# Patient Record
Sex: Female | Born: 1970 | Race: Black or African American | Hispanic: No | Marital: Married | Smoking: Never smoker
Health system: Southern US, Community
[De-identification: ages and names within clinical notes are randomized; demographics above are authoritative.]

## PROBLEM LIST (undated history)

## (undated) DIAGNOSIS — G4730 Sleep apnea, unspecified: Secondary | ICD-10-CM

## (undated) DIAGNOSIS — Z801 Family history of malignant neoplasm of trachea, bronchus and lung: Secondary | ICD-10-CM

## (undated) DIAGNOSIS — M1990 Unspecified osteoarthritis, unspecified site: Secondary | ICD-10-CM

## (undated) DIAGNOSIS — K829 Disease of gallbladder, unspecified: Secondary | ICD-10-CM

## (undated) DIAGNOSIS — Z800 Family history of malignant neoplasm of digestive organs: Secondary | ICD-10-CM

## (undated) DIAGNOSIS — C801 Malignant (primary) neoplasm, unspecified: Secondary | ICD-10-CM

## (undated) DIAGNOSIS — Z923 Personal history of irradiation: Secondary | ICD-10-CM

## (undated) DIAGNOSIS — B029 Zoster without complications: Secondary | ICD-10-CM

## (undated) DIAGNOSIS — Z803 Family history of malignant neoplasm of breast: Secondary | ICD-10-CM

## (undated) DIAGNOSIS — D219 Benign neoplasm of connective and other soft tissue, unspecified: Secondary | ICD-10-CM

## (undated) DIAGNOSIS — Z5189 Encounter for other specified aftercare: Secondary | ICD-10-CM

## (undated) DIAGNOSIS — F419 Anxiety disorder, unspecified: Secondary | ICD-10-CM

## (undated) DIAGNOSIS — I10 Essential (primary) hypertension: Secondary | ICD-10-CM

## (undated) HISTORY — DX: Disease of gallbladder, unspecified: K82.9

## (undated) HISTORY — DX: Encounter for other specified aftercare: Z51.89

## (undated) HISTORY — DX: Anxiety disorder, unspecified: F41.9

## (undated) HISTORY — DX: Benign neoplasm of connective and other soft tissue, unspecified: D21.9

## (undated) HISTORY — DX: Family history of malignant neoplasm of breast: Z80.3

## (undated) HISTORY — DX: Family history of malignant neoplasm of trachea, bronchus and lung: Z80.1

## (undated) HISTORY — DX: Malignant (primary) neoplasm, unspecified: C80.1

## (undated) HISTORY — DX: Zoster without complications: B02.9

## (undated) HISTORY — PX: CHOLECYSTECTOMY: SHX55

## (undated) HISTORY — DX: Family history of malignant neoplasm of digestive organs: Z80.0

## (undated) HISTORY — PX: FOOT SURGERY: SHX648

## (undated) HISTORY — PX: TUBAL LIGATION: SHX77

---

## 1998-03-28 ENCOUNTER — Other Ambulatory Visit: Admission: RE | Admit: 1998-03-28 | Discharge: 1998-03-28 | Payer: Self-pay | Admitting: *Deleted

## 1998-03-28 ENCOUNTER — Encounter: Admission: RE | Admit: 1998-03-28 | Discharge: 1998-03-28 | Payer: Self-pay | Admitting: Internal Medicine

## 1999-02-06 ENCOUNTER — Encounter: Admission: RE | Admit: 1999-02-06 | Discharge: 1999-02-06 | Payer: Self-pay | Admitting: Internal Medicine

## 1999-02-14 ENCOUNTER — Other Ambulatory Visit: Admission: RE | Admit: 1999-02-14 | Discharge: 1999-02-14 | Payer: Self-pay | Admitting: *Deleted

## 1999-04-07 ENCOUNTER — Emergency Department (HOSPITAL_COMMUNITY): Admission: EM | Admit: 1999-04-07 | Discharge: 1999-04-07 | Payer: Self-pay | Admitting: Emergency Medicine

## 2000-02-17 ENCOUNTER — Other Ambulatory Visit: Admission: RE | Admit: 2000-02-17 | Discharge: 2000-02-17 | Payer: Self-pay | Admitting: Internal Medicine

## 2000-02-24 ENCOUNTER — Encounter: Payer: Self-pay | Admitting: Internal Medicine

## 2000-02-24 ENCOUNTER — Ambulatory Visit (HOSPITAL_COMMUNITY): Admission: RE | Admit: 2000-02-24 | Discharge: 2000-02-24 | Payer: Self-pay | Admitting: Internal Medicine

## 2000-04-17 ENCOUNTER — Ambulatory Visit (HOSPITAL_COMMUNITY): Admission: RE | Admit: 2000-04-17 | Discharge: 2000-04-17 | Payer: Self-pay | Admitting: Family Medicine

## 2000-05-11 ENCOUNTER — Observation Stay (HOSPITAL_COMMUNITY): Admission: RE | Admit: 2000-05-11 | Discharge: 2000-05-13 | Payer: Self-pay | Admitting: General Surgery

## 2000-05-11 ENCOUNTER — Encounter (INDEPENDENT_AMBULATORY_CARE_PROVIDER_SITE_OTHER): Payer: Self-pay | Admitting: Specialist

## 2000-10-22 ENCOUNTER — Emergency Department (HOSPITAL_COMMUNITY): Admission: EM | Admit: 2000-10-22 | Discharge: 2000-10-22 | Payer: Self-pay | Admitting: Internal Medicine

## 2001-03-23 ENCOUNTER — Other Ambulatory Visit: Admission: RE | Admit: 2001-03-23 | Discharge: 2001-03-23 | Payer: Self-pay | Admitting: Obstetrics and Gynecology

## 2002-08-22 ENCOUNTER — Ambulatory Visit (HOSPITAL_COMMUNITY): Admission: RE | Admit: 2002-08-22 | Discharge: 2002-08-22 | Payer: Self-pay | Admitting: *Deleted

## 2002-08-22 ENCOUNTER — Encounter: Payer: Self-pay | Admitting: *Deleted

## 2002-11-29 ENCOUNTER — Ambulatory Visit (HOSPITAL_COMMUNITY): Admission: RE | Admit: 2002-11-29 | Discharge: 2002-11-29 | Payer: Self-pay | Admitting: *Deleted

## 2002-11-29 ENCOUNTER — Encounter: Payer: Self-pay | Admitting: *Deleted

## 2003-01-26 ENCOUNTER — Ambulatory Visit (HOSPITAL_COMMUNITY): Admission: RE | Admit: 2003-01-26 | Discharge: 2003-01-26 | Payer: Self-pay | Admitting: Family Medicine

## 2003-01-26 ENCOUNTER — Encounter: Payer: Self-pay | Admitting: Family Medicine

## 2003-03-07 ENCOUNTER — Encounter: Admission: RE | Admit: 2003-03-07 | Discharge: 2003-03-21 | Payer: Self-pay | Admitting: Sports Medicine

## 2004-02-26 ENCOUNTER — Other Ambulatory Visit: Admission: RE | Admit: 2004-02-26 | Discharge: 2004-02-26 | Payer: Self-pay | Admitting: Family Medicine

## 2005-04-09 ENCOUNTER — Other Ambulatory Visit: Admission: RE | Admit: 2005-04-09 | Discharge: 2005-04-09 | Payer: Self-pay | Admitting: Family Medicine

## 2009-11-12 ENCOUNTER — Emergency Department (HOSPITAL_COMMUNITY): Admission: EM | Admit: 2009-11-12 | Discharge: 2009-11-12 | Payer: Self-pay | Admitting: Family Medicine

## 2010-09-01 ENCOUNTER — Emergency Department (HOSPITAL_COMMUNITY)
Admission: EM | Admit: 2010-09-01 | Discharge: 2010-09-02 | Payer: Self-pay | Source: Home / Self Care | Admitting: Emergency Medicine

## 2010-11-11 LAB — COMPREHENSIVE METABOLIC PANEL
AST: 18 U/L (ref 0–37)
CO2: 27 mEq/L (ref 19–32)
Chloride: 107 mEq/L (ref 96–112)
Creatinine, Ser: 0.98 mg/dL (ref 0.4–1.2)
GFR calc Af Amer: >60 mL/min (ref >60)
GFR calc non Af Amer: >60 mL/min (ref >60)
Glucose, Bld: 112 mg/dL — ABNORMAL HIGH (ref 70–99)
Total Bilirubin: 0.6 mg/dL (ref 0.3–1.2)

## 2010-11-11 LAB — CBC
HCT: 42.2 % (ref 36.0–46.0)
Hemoglobin: 13.8 g/dL (ref 12.0–15.0)
MCH: 26.8 pg (ref 26.0–34.0)
MCHC: 32.7 g/dL (ref 30.0–36.0)
MCV: 82.1 fL (ref 78.0–100.0)
RBC: 5.14 MIL/uL — ABNORMAL HIGH (ref 3.87–5.11)

## 2010-11-11 LAB — DIFFERENTIAL
Basophils Absolute: 0 10*3/uL (ref 0.0–0.1)
Eosinophils Absolute: 0.1 10*3/uL (ref 0.0–0.7)
Eosinophils Relative: 1 % (ref 0–5)
Lymphocytes Relative: 15 % (ref 12–46)
Neutrophils Relative %: 77 % (ref 43–77)

## 2010-11-11 LAB — URINALYSIS, ROUTINE W REFLEX MICROSCOPIC
Bilirubin Urine: NEGATIVE
Glucose, UA: NEGATIVE mg/dL
Hgb urine dipstick: NEGATIVE
Ketones, ur: NEGATIVE mg/dL
Specific Gravity, Urine: 1.02 (ref 1.005–1.030)
pH: 7 (ref 5.0–8.0)

## 2010-11-11 LAB — POCT PREGNANCY, URINE: Preg Test, Ur: NEGATIVE

## 2011-01-17 NOTE — Op Note (Signed)
[facility]  Patient:    Erin Gallagher, Erin Gallagher                     MRN: [id number] Proc. Date: 05/11/00 Adm. Date:  [id number] Attending:  Arlis Porta CC:         Kern Reap, M.D.  Anselmo Rod, M.D.   Operative Report  PREOPERATIVE DIAGNOSIS:  Chronic calculus cholecystitis.  POSTOPERATIVE DIAGNOSIS:  Chronic calculus cholecystitis.  PROCEDURE:  Laparoscopic cholecystectomy.  SURGEON:  Adolph Pollack, M.D.  ASSISTANT:  Donnie Coffin. Samuella Cota, M.D.  ANESTHESIA:  General.  INDICATIONS:  [name] is a 40 year old female who has had three classic attacks of biliary colic and has gallstones by ultrasound.  Her common bile duct diameter is normal.  Her preoperative liver function tests are normal. She now presents for elective laparoscopic cholecystectomy.  DESCRIPTION OF PROCEDURE:  She is placed supine on the operating table, and a general anesthetic was administered.  Her abdomen was sterilely prepped and draped.  A previous transverse subumbilical incision was reincised sharply, and the scar tissue was divided sharply down to the level of the fascia where a 1-cm incision was made in the midline fascia.  A pursestring suture of #0 Vicryl was placed around the fascial edges.  The peritoneal cavity was entered bluntly and under direct vision.  A Hasson trocar was introduced into the peritoneal cavity and a pneumoperitoneum was created by insufflation of carbon dioxide gas.  The laparoscope was introduced, and the surface of the liver was examined.  No abnormalities were noted.  Under direct vision, an 11-mm trocar was placed through an epigastric incision, and two 5-mm trocars were placed through right abdominal incisions.  The fundus of the gallbladder was grasped and retracted toward the right shoulder and the infundibulum was grasped and retracted laterally.  Using careful blunt dissection, I identified the cystic artery and could  trace it down to the formation of the common bile duct and also up into the neck of the gallbladder.  I clipped it three times proximally, once distally, and then divided it sharply.  Next, the cystic artery was identified, clipped, and divided.  Using electrocautery, the gallbladder was dissected free from the liver bed.  Bleeding points were controlled with the cautery.  There was no evidence of bleeding or bile leak once the gallbladder was completely removed.  I reinspected the gallbladder fossa once again and noted it was dry.  I subsequently removed the gallbladder through a subumbilical port.  Next, all trocars were removed, and the pneumoperitoneum was released.  The sub umbilical fascia defect was closed by tightening up and tying down the pursestring suture.  The skin incisions were then closed with 4-0 Monocril subcuticular stitches followed by sterile dressings.  She tolerated the procedure well without any apparent complications and was taken to the recovery room in satisfactory condition. DD:  05/11/00 TD:  05/11/00 Job: [id number]

## 2011-01-17 NOTE — Procedures (Signed)
[facility]  Patient:    Erin Gallagher, Erin Gallagher                     MRN: [id number] Proc. Date: 04/17/00 Adm. Date:  [id number] Disc. Date: [id number] Attending:  Tawni Millers CC:         Eunice Blase, M.D.             Adolph Pollack, M.D.                           Procedure Report  DATE OF BIRTH:  11-Aug-1971  PROCEDURE PERFORMED:  Esophagogastroduodenoscopy.  ENDOSCOPIST:  Anselmo Rod, M.D.  INSTRUMENT USED:  Olympus video panendoscope.  INDICATIONS:  Epigastric and right upper quadrant pain in a 40 year old female, rule out peptic ulcer disease, esophagitis, gastritis, etc.  PREPARATION:  Informed consent was procured from the patient and the patient was fasted for eight hours prior to the procedure.  PHYSICAL EXAMINATION:  VITAL SIGNS:  Stable.  NECK:  Supple.  CHEST:  Clear to auscultation.  HEART:  S1, S2 regular.  ABDOMEN:  Soft with normal abdominal bowel sounds. Epigastric tenderness on palpation with guarding noted.  No rebound, no rigidity, no hepatosplenomegaly.  DESCRIPTION OF PROCEDURE:  The patient was placed in the left lateral decubitus position and sedated with 50 mg of Demerol and 5 mg of Versed intravenously.  Once the patient was adequately sedated, and maintained on low-flow oxygen and continuous cardiac monitoring, the Olympus video panendoscope was advanced through the mouth piece, over the tongue, into the esophagus under direct vision.  The entire esophagus appeared normal without evidence of ring, stricture, mass, bleed, or esophagitis.  A small hiatal hernia was seen on high retroflexion.  The rest of the gastric mucosa and the proximal small bowel up to 60.0 cm appeared normal.  IMPRESSION:  Normal esophagogastroduodenoscopy except for a small hiatal hernia.  RECOMMENDATIONS: 1. The patient is to follow up with Dr. Adolph Pollack.  She is scheduled    to see him on May 01, 2000, for  evaluation of a laparoscopic    cholecystectomy. 2. She has strongly been advised to refrain from the use of all nonsteroidal    anti-inflammatory drugs. 3. Continue PPI at this time. 4. Outpatient followup after her laparoscopic cholecystectomy has been done.DD: 04/17/00 TD:  04/18/00 Job: [id number]

## 2011-03-17 ENCOUNTER — Inpatient Hospital Stay (INDEPENDENT_AMBULATORY_CARE_PROVIDER_SITE_OTHER)
Admission: RE | Admit: 2011-03-17 | Discharge: 2011-03-17 | Disposition: A | Discharge status: Home / Self Care | Payer: 59 | Source: Ambulatory Visit | Attending: Family Medicine | Admitting: Family Medicine

## 2011-03-17 DIAGNOSIS — R112 Nausea with vomiting, unspecified: Secondary | ICD-10-CM

## 2011-03-17 DIAGNOSIS — R51 Headache: Secondary | ICD-10-CM

## 2011-09-06 ENCOUNTER — Other Ambulatory Visit (HOSPITAL_COMMUNITY): Payer: Self-pay | Admitting: Internal Medicine

## 2011-09-06 ENCOUNTER — Ambulatory Visit (HOSPITAL_COMMUNITY)
Admission: RE | Admit: 2011-09-06 | Discharge: 2011-09-06 | Disposition: A | Discharge status: Home / Self Care | Payer: 59 | Source: Ambulatory Visit | Attending: Internal Medicine | Admitting: Internal Medicine

## 2011-09-06 DIAGNOSIS — IMO0001 Reserved for inherently not codable concepts without codable children: Secondary | ICD-10-CM

## 2011-09-06 DIAGNOSIS — R52 Pain, unspecified: Secondary | ICD-10-CM

## 2011-09-06 DIAGNOSIS — R079 Chest pain, unspecified: Secondary | ICD-10-CM | POA: Insufficient documentation

## 2011-09-06 DIAGNOSIS — R0989 Other specified symptoms and signs involving the circulatory and respiratory systems: Secondary | ICD-10-CM | POA: Insufficient documentation

## 2011-09-06 DIAGNOSIS — R0602 Shortness of breath: Secondary | ICD-10-CM | POA: Insufficient documentation

## 2011-11-12 ENCOUNTER — Emergency Department (INDEPENDENT_AMBULATORY_CARE_PROVIDER_SITE_OTHER)
Admission: EM | Admit: 2011-11-12 | Discharge: 2011-11-12 | Disposition: A | Discharge status: Home Health | Payer: 59 | Source: Home / Self Care | Attending: Family Medicine | Admitting: Family Medicine

## 2011-11-12 ENCOUNTER — Encounter (HOSPITAL_COMMUNITY): Payer: Self-pay | Admitting: Emergency Medicine

## 2011-11-12 DIAGNOSIS — J310 Chronic rhinitis: Secondary | ICD-10-CM

## 2011-11-12 HISTORY — DX: Essential (primary) hypertension: I10

## 2011-11-12 MED ORDER — FLUTICASONE PROPIONATE 50 MCG/ACT NA SUSP: 2.0000 | Freq: Every day | NASAL | Status: AC

## 2011-11-12 MED ORDER — HYDROCOD POLST-CHLORPHEN POLST 10-8 MG/5ML PO LQCR: 5.0000 mL | Freq: Two times a day (BID) | ORAL | Status: DC | PRN

## 2011-11-12 NOTE — ED Provider Notes (Signed)
History     CSN: [id number]  Arrival date & time 11/12/11  3086   First MD Initiated Contact with Patient 11/12/11 0840      Chief Complaint  Patient presents with  . Sinusitis    (Consider location/radiation/quality/duration/timing/severity/associated sxs/prior treatment) HPI Comments: Erin Gallagher presents for evaluation of nasal congestion, rhinorrhea, sore throat, and itchy ears. She reports onset of symptoms 5 days ago. She denies any fever or history of seasonal allergies. She reports a cough is worse at night. The cough is nonproductive.  Patient is a 41 y.o. female presenting with URI. The history is provided by the patient.  URI The primary symptoms include fatigue, sore throat, cough and myalgias. Primary symptoms do not include fever. The current episode started 3 to 5 days ago. This is a new problem. The problem has not changed since onset. The fatigue began 3 to 5 days ago. The fatigue has been unchanged since its onset.  Symptoms associated with the illness include congestion and rhinorrhea. The illness is not associated with chills.    Past Medical History  Diagnosis Date  . Hypertension     Past Surgical History  Procedure Date  . Tubal ligation   . Cesarean section     No family history on file.  History  Substance Use Topics  . Smoking status: Current Everyday Smoker  . Smokeless tobacco: Not on file  . Alcohol Use: Yes    OB History    Grav Para Term Preterm Abortions TAB SAB Ect Mult Living                  Review of Systems  Constitutional: Positive for fatigue. Negative for fever and chills.  HENT: Positive for congestion, sore throat and rhinorrhea.   Eyes: Negative.   Respiratory: Positive for cough.   Cardiovascular: Negative.   Gastrointestinal: Negative.   Genitourinary: Negative.   Musculoskeletal: Positive for myalgias.  Skin: Negative.   Neurological: Negative.     Allergies  Review of patient's allergies indicates no known  allergies.  Home Medications   Current Outpatient Rx  Name Route Sig Dispense Refill  . AMLODIPINE BESY-BENAZEPRIL HCL 10-20 MG PO CAPS Oral Take 1 capsule by mouth daily.    Marland Kitchen HYDROCHLOROTHIAZIDE 25 MG PO TABS Oral Take 25 mg by mouth daily.    Marland Kitchen HYDROCOD POLST-CPM POLST ER 10-8 MG/5ML PO LQCR Oral Take 5 mLs by mouth every 12 (twelve) hours as needed. 140 mL 0  . FLUTICASONE PROPIONATE 50 MCG/ACT NA SUSP Nasal Place 2 sprays into the nose daily. 16 g 2    BP 139/93  Pulse 85  Temp(Src) 99.8 F (37.7 C) (Oral)  Resp 17  SpO2 100%  LMP 10/27/2011  Physical Exam  Nursing note and vitals reviewed. Constitutional: She is oriented to person, place, and time. She appears well-developed and well-nourished.  HENT:  Head: Normocephalic and atraumatic.  Right Ear: Tympanic membrane normal.  Left Ear: Tympanic membrane normal.  Mouth/Throat: Uvula is midline, oropharynx is clear and moist and mucous membranes are normal.  Eyes: EOM are normal.  Neck: Normal range of motion.  Pulmonary/Chest: Effort normal and breath sounds normal. She has no decreased breath sounds. She has no wheezes. She has no rhonchi.  Musculoskeletal: Normal range of motion.  Neurological: She is alert and oriented to person, place, and time.  Skin: Skin is warm and dry.  Psychiatric: Her behavior is normal.    ED Course  Procedures (including critical care time)  Labs Reviewed - No data to display No results found.   1. Rhinitis       MDM  Advised supportive care with nasal saline, antihistamine; given rx for fluticasone and Tussionex PRN        Renaee Munda, MD 11/12/11 [phone]

## 2011-11-12 NOTE — ED Notes (Signed)
PT HERE WITH SINUS SX RUNNY NOSE,SORE THROAT,ITCHY EARS AND  OCASSIONALLY COUGH.PT HAS BEEN TAKING MUCINEX AND COLD/FLU MEDS BUT NO RELIEF

## 2011-11-12 NOTE — Discharge Instructions (Signed)
Your history and physical exam are consistent with a viral, versus an allergic etiology. You may use an over-the-counter nasal saline spray such as Ayr, or Simply Saline for symptoms of nasal congestion, and drainage. If your symptoms not improving with conservative measures, you may use the prescription nasal spray as directed; you may also add an over-the-counter antihistamine such as loratadine, which is generic for Claritin, or cetirizine, generic for Zyrtec. Use the prescription cough syrup, as needed and as directed. Should you have fever, I recommend aggressive fever control with acetaminophen (Tylenol) and/or ibuprofen. You may use these together, alternating them every 4 hours, or individually, every 8 hours. For example, take acetaminophen 500 to 1000 mg at 12 noon, then 600 to 800 mg of ibuprofen at 4 pm, then acetaminophen at 8 pm, etc. Also, stay hydrated with clear liquids. Return to care should your symptoms not improve, or worsen in any way.

## 2013-06-22 ENCOUNTER — Ambulatory Visit (INDEPENDENT_AMBULATORY_CARE_PROVIDER_SITE_OTHER): Payer: 59 | Admitting: Family Medicine

## 2013-06-22 ENCOUNTER — Encounter: Payer: Self-pay | Admitting: Family Medicine

## 2013-06-22 ENCOUNTER — Other Ambulatory Visit (HOSPITAL_COMMUNITY)
Admission: RE | Admit: 2013-06-22 | Discharge: 2013-06-22 | Disposition: A | Discharge status: Home / Self Care | Payer: 59 | Source: Ambulatory Visit | Attending: Family Medicine | Admitting: Family Medicine

## 2013-06-22 VITALS — BP 149/84 | HR 70 | Temp 98.1°F | Ht 65.0 in | Wt 236.0 lb

## 2013-06-22 DIAGNOSIS — Z0000 Encounter for general adult medical examination without abnormal findings: Secondary | ICD-10-CM

## 2013-06-22 DIAGNOSIS — Z1151 Encounter for screening for human papillomavirus (HPV): Secondary | ICD-10-CM | POA: Insufficient documentation

## 2013-06-22 DIAGNOSIS — Z01419 Encounter for gynecological examination (general) (routine) without abnormal findings: Secondary | ICD-10-CM | POA: Insufficient documentation

## 2013-06-22 DIAGNOSIS — I10 Essential (primary) hypertension: Secondary | ICD-10-CM | POA: Insufficient documentation

## 2013-06-22 DIAGNOSIS — Z113 Encounter for screening for infections with a predominantly sexual mode of transmission: Secondary | ICD-10-CM | POA: Insufficient documentation

## 2013-06-22 DIAGNOSIS — Z124 Encounter for screening for malignant neoplasm of cervix: Secondary | ICD-10-CM

## 2013-06-22 DIAGNOSIS — N898 Other specified noninflammatory disorders of vagina: Secondary | ICD-10-CM | POA: Insufficient documentation

## 2013-06-22 DIAGNOSIS — Z1379 Encounter for other screening for genetic and chromosomal anomalies: Secondary | ICD-10-CM | POA: Insufficient documentation

## 2013-06-22 DIAGNOSIS — R1013 Epigastric pain: Secondary | ICD-10-CM

## 2013-06-22 LAB — POCT WET PREP (WET MOUNT)

## 2013-06-22 MED ORDER — FLUCONAZOLE 150 MG PO TABS: ORAL_TABLET | ORAL | Status: DC

## 2013-06-22 MED ORDER — OMEPRAZOLE 40 MG PO CPDR: 40.0000 mg | DELAYED_RELEASE_CAPSULE | Freq: Every day | ORAL | Status: DC

## 2013-06-22 MED ORDER — NYSTATIN-TRIAMCINOLONE 100000-0.1 UNIT/GM-% EX OINT: TOPICAL_OINTMENT | Freq: Two times a day (BID) | CUTANEOUS | Status: DC

## 2013-06-22 NOTE — Assessment & Plan Note (Signed)
Patient's symptoms consistent with yeast infection. Will treat with topical Mycology and fluconazole.

## 2013-06-22 NOTE — Progress Notes (Signed)
Subjective:     Patient ID: Erin Gallagher, female   DOB: 15-Jul-1971, 42 y.o.   MRN: [id number]  HPI 42 year old female with history of HTN presents to establish care. PMH, Surgical Hx, Family History, and Social history reviewed and updated (See Below).  Past Medical History  Diagnosis Date  . Hypertension    Past Surgical History  Procedure Laterality Date  . Tubal ligation    . Cesarean section    . Cholecystectomy     History   Social History  . Marital Status: Married    Spouse Name: N/A    Number of Children: N/A  . Years of Education: N/A   Occupational History  . Not on file.   Social History Main Topics  . Smoking status: Never Smoker   . Smokeless tobacco: Not on file  . Alcohol Use: 1.2 oz/week    2 Cans of beer per week  . Drug Use: No  . Sexual Activity: Yes    Birth Control/ Protection: Other-see comments     Comment: Tubal ligation2   Other Topics Concern  . Not on file   Social History Narrative  . No narrative on file   Family History  Problem Relation Age of Onset  . Hypertension Mother   . Cancer Father   . Heart disease Father   . Hypertension Father   . Stroke Maternal Grandmother   . Hypertension Maternal Grandmother    Current complaints today include vaginal discharge and abdominal pain.  1) Vaginal discharge Onset: 1-2 months ago  Description: White malodorous discharge; Associated itching.   ROS: Dysuria: no  Bleeding: no  Pelvic pain: no  Back pain: no  Fever: no   2) Upper abdominal pain - Patient reports intermittent epigastric abdominal pain for the past year. - She describes it as a "nagging" pain; 2-3/10 in severity. - No associated weight loss, nausea, vomiting, diarrhea.  - No red flag symptoms: weight loss, night sweats, anemia/fatigue. - No exacerbating or relieving factors.   Review of Systems Per HPI     Objective:   Physical Exam Exam: General: well appearing female in NAD. Cardiovascular: RRR. No  murmurs, rubs, or gallops. Respiratory: CTAB. No rales, rhonchi, or wheeze. Abdomen: soft, tender to palpation in the epigastrium.  No RUQ tenderness.  No rebound or guarding. No organomegaly. Pelvic Exam:        External: normal female genitalia without lesions or masses        Vagina: normal without lesions or masses        Cervix: normal without lesions or masses        Adnexa: normal bimanual exam without masses or fullness        Uterus: normal by palpation        Pap smear: performed        Samples for Wet prep, GC/Chlamydia obtained Breasts: breasts appear normal, no appreciable masses, no skin or nipple changes or axillary nodes.    Assessment:     See Problem list     Plan:

## 2013-06-22 NOTE — Assessment & Plan Note (Signed)
Pap smear done today. Advised mammogram be obtained prior to the end of the year (info given).

## 2013-06-22 NOTE — Patient Instructions (Addendum)
It was nice to see you today.  I have sent in 3 prescriptions for you.  2 are for your itching and discharge. The other is for your stomach (GERD/Gastritis medication).  I will send you a letter with your Pap smear and lab results.  Please be sure to get your mammogram this year.   Follow up in 6 months.

## 2013-06-22 NOTE — Assessment & Plan Note (Signed)
Untreated at this time as patient "doesn't like taking medications" Will readdress at follow up.

## 2013-06-22 NOTE — Assessment & Plan Note (Signed)
Likely gastritis vs. GERD. Will treat with PPI and re-evaluate.

## 2013-07-04 ENCOUNTER — Encounter: Payer: Self-pay | Admitting: Family Medicine

## 2013-11-05 ENCOUNTER — Encounter: Payer: 59 | Attending: Internal Medicine | Admitting: Dietician

## 2013-11-05 DIAGNOSIS — E119 Type 2 diabetes mellitus without complications: Secondary | ICD-10-CM | POA: Insufficient documentation

## 2013-11-05 DIAGNOSIS — Z713 Dietary counseling and surveillance: Secondary | ICD-10-CM | POA: Insufficient documentation

## 2013-11-05 NOTE — Progress Notes (Signed)
HPI Review of Systems     Physical Exam        Weight Management Class:  Appt start time: 1000   End time:  1100.  Patient was seen on 11/05/2013 for Weight Management Education at the [facility].   Weight today: unknown   The following the learning objectives were met by the patient during this course:  Identify healthy eating/lifestyle behaviors for weight loss  Be familiar with the different food groups and appropriate serving sizes  Learn healthy meal planning  State the appropriate amount of weight to lose weekly  Identify 1-2 goals to begin to lose weight   Follow-Up Plan: Patient to set specific, measurable, attainable, realistic, and time-oriented goals for weight loss.      

## 2013-11-07 ENCOUNTER — Encounter: Payer: Self-pay | Admitting: Dietician

## 2013-11-07 NOTE — Patient Instructions (Signed)
Patient to set specific, measurable, attainable, realistic, and time-oriented goals.  

## 2014-06-22 ENCOUNTER — Encounter: Payer: 59 | Admitting: Family Medicine

## 2014-07-10 ENCOUNTER — Telehealth: Payer: Self-pay | Admitting: Family Medicine

## 2014-07-10 ENCOUNTER — Ambulatory Visit (INDEPENDENT_AMBULATORY_CARE_PROVIDER_SITE_OTHER): Payer: 59 | Admitting: Family Medicine

## 2014-07-10 ENCOUNTER — Encounter: Payer: Self-pay | Admitting: Family Medicine

## 2014-07-10 VITALS — BP 154/97 | HR 85 | Temp 98.4°F | Ht 65.0 in | Wt 249.6 lb

## 2014-07-10 DIAGNOSIS — I10 Essential (primary) hypertension: Secondary | ICD-10-CM

## 2014-07-10 DIAGNOSIS — Z13220 Encounter for screening for lipoid disorders: Secondary | ICD-10-CM

## 2014-07-10 DIAGNOSIS — Z131 Encounter for screening for diabetes mellitus: Secondary | ICD-10-CM

## 2014-07-10 DIAGNOSIS — R1013 Epigastric pain: Secondary | ICD-10-CM

## 2014-07-10 DIAGNOSIS — Z0000 Encounter for general adult medical examination without abnormal findings: Secondary | ICD-10-CM

## 2014-07-10 LAB — LIPID PANEL
CHOL/HDL RATIO: 3.1 ratio
Cholesterol: 171 mg/dL (ref 0–200)
HDL: 55 mg/dL (ref >39)
LDL CALC: 97 mg/dL (ref 0–99)
TRIGLYCERIDES: 93 mg/dL (ref <150)
VLDL: 19 mg/dL (ref 0–40)

## 2014-07-10 LAB — COMPLETE METABOLIC PANEL WITH GFR
ALK PHOS: 58 U/L (ref 39–117)
ALT: 16 U/L (ref 0–35)
AST: 16 U/L (ref 0–37)
Albumin: 4 g/dL (ref 3.5–5.2)
BILIRUBIN TOTAL: 0.4 mg/dL (ref 0.2–1.2)
BUN: 9 mg/dL (ref 6–23)
CO2: 23 meq/L (ref 19–32)
CREATININE: 0.73 mg/dL (ref 0.50–1.10)
Calcium: 8.7 mg/dL (ref 8.4–10.5)
Chloride: 105 mEq/L (ref 96–112)
GFR, Est African American: >89 mL/min
GFR, Est Non African American: >89 mL/min
Glucose, Bld: 86 mg/dL (ref 70–99)
Potassium: 4.6 mEq/L (ref 3.5–5.3)
SODIUM: 137 meq/L (ref 135–145)
TOTAL PROTEIN: 6.7 g/dL (ref 6.0–8.3)

## 2014-07-10 LAB — CBC
HCT: 40.9 % (ref 36.0–46.0)
Hemoglobin: 13.7 g/dL (ref 12.0–15.0)
MCH: 25.3 pg — AB (ref 26.0–34.0)
MCHC: 33.5 g/dL (ref 30.0–36.0)
MCV: 75.6 fL — AB (ref 78.0–100.0)
PLATELETS: 337 10*3/uL (ref 150–400)
RBC: 5.41 MIL/uL — AB (ref 3.87–5.11)
RDW: 15.9 % — AB (ref 11.5–15.5)
WBC: 7.7 10*3/uL (ref 4.0–10.5)

## 2014-07-10 LAB — POCT GLYCOSYLATED HEMOGLOBIN (HGB A1C): Hemoglobin A1C: 6

## 2014-07-10 LAB — POCT H PYLORI SCREEN: H PYLORI SCREEN, POC: NEGATIVE

## 2014-07-10 MED ORDER — AMLODIPINE BESYLATE 10 MG PO TABS: 10.0000 mg | ORAL_TABLET | Freq: Every day | ORAL | Status: DC

## 2014-07-10 MED ORDER — LOSARTAN POTASSIUM 50 MG PO TABS: 50.0000 mg | ORAL_TABLET | Freq: Every day | ORAL | Status: DC

## 2014-07-10 MED ORDER — PANTOPRAZOLE SODIUM 40 MG PO TBEC: 40.0000 mg | DELAYED_RELEASE_TABLET | Freq: Two times a day (BID) | ORAL | Status: DC

## 2014-07-10 NOTE — Assessment & Plan Note (Signed)
Patient with dyspepsia. This is likely secondary to NSAID use.  However, this could be secondary to H. Pylori infection. Obtaining CMP and H. Pylori serology today. Starting patient on high-dose PPI.  Will await exploratory serologies and treat as indicated. If patient fails to improve with conservative management will need GI evaluation/scope.

## 2014-07-10 NOTE — Progress Notes (Signed)
   Subjective:    Patient ID: Erin Gallagher, female    DOB: February 20, 1971, 43 y.o.   MRN: [id number]  HPI 43 year old female with HTN presents for an annual physical exam/wellness visit.  1) Preventative care  Last Pap smear - 06/22/13; Normal.  Last mammogram was at age 61 (per patient).  Patient already received Influenza vaccine in October  Patient dieting and actively trying to lose weight.   2) HTN  Home BP Monitoring - No  Medications - Not currently on any medications.  Has been on several agents in the past - ACEI (developed cough), Norvasc, HCTZ.  Compliance -  N/A  ROS: Denies chest pain, SOB; Has had headache and dizziness in recent past.   3) Abdominal pain  Located in the epigastric region  Pain is moderate in severity (5-6/10) and constant.  No exacerbating factors.  Patient improvement with PPI therapy previously (nearly 1 year ago).  No relation to food. No reports of reflux or heartburn.  No red flags: weight loss, fevers/chills, night sweats, early satiety. Denies nausea, vomiting.  Patient has had blood in the stool in the past (hematochezia); this appears to be associated with constipation.  Patient is taking Aleve 2 pills several days a week ( approximately 5 days a week).  She's been doing this for the past 3 months.  This is likely concerning factor.  PMH, Surgical Hx, Family Hx, Social History reviewed (see below).  Past Medical History  Diagnosis Date  . Hypertension    Past Surgical History  Procedure Laterality Date  . Tubal ligation    . Cesarean section    . Cholecystectomy     Family History  Problem Relation Age of Onset  . Hypertension Mother   . Cancer Father   . Heart disease Father   . Hypertension Father   . Stroke Maternal Grandmother   . Hypertension Maternal Grandmother    History   Social History  . Marital Status: Married    Spouse Name: N/A    Number of Children: N/A  . Years of Education: N/A   Social  History Main Topics  . Smoking status: Never Smoker   . Smokeless tobacco: None  . Alcohol Use: 1.2 oz/week    2 Cans of beer per week  . Drug Use: No  . Sexual Activity: Yes    Birth Control/ Protection: Other-see comments     Comment: Tubal ligation2   Other Topics Concern  . None   Social History Narrative   Review of Systems  General: no fever, chills, weight loss, night sweats GI: No nausea, vomiting, diarrhea.  CV/Pulm: No chest pain, SOB, PND, orthopnea.    Objective:   Physical Exam Filed Vitals:   07/10/14 0845  BP: 154/97  Pulse: 85  Temp: 98.4 F (36.9 C)   Exam: General: well appearing female in NAD.  Cardiovascular: RRR. No murmurs, rubs, or gallops. Respiratory: CTAB. No rales, rhonchi, or wheeze. Abdomen: soft, nondistended. Tender to palpation epigastric region. No rebound or guarding. No palpable organomegaly. Extremities: No LE edema. Skin: Warm, dry, intact.    Assessment & Plan:  See Problem List

## 2014-07-10 NOTE — Telephone Encounter (Signed)
Pt called and was seen earlier this morning and was wondering where her cream was that she thought the doctor was calling in also. jw

## 2014-07-10 NOTE — Patient Instructions (Addendum)
It was nice to see you today.  Below is what we discussed today.  1) HTN - I have started you on 2 agents (they are two separate pills as the combo is expensive).  2) Abdominal pain  - I am doing some lab work (I will call you if things are abnormal) - I am starting you on Protonix (take twice daily).  Follow up in 7-10 days for a repeat lab.  Take care  Dr. Lacinda Axon

## 2014-07-10 NOTE — Assessment & Plan Note (Signed)
Uncontrolled and patient is not on treatment at this time. Will start patient on amlodipine and losartan today. Follow-up in 7-10 days for BMP.

## 2014-07-10 NOTE — Addendum Note (Signed)
Addended by: [place] Pink on: 07/10/2014 10:06 AM   Modules accepted: Orders

## 2014-07-10 NOTE — Assessment & Plan Note (Signed)
Patient up-to-date on Pap smear, and influenza vaccine. Screening Lipid panel and A1c today. We had a long discussion regarding mammograms.  Patient given handout today.  Patient to decide on whether she would like a screening mammogram in the near future ( based on differing guidelines in her age group).  I did Inform her that I prefer the ACOG guidelines which recommend annual screening at 40.

## 2014-07-11 ENCOUNTER — Encounter: Payer: Self-pay | Admitting: Family Medicine

## 2014-07-11 MED ORDER — TRIAMCINOLONE ACETONIDE 0.1 % EX OINT: 1.0000 "application " | TOPICAL_OINTMENT | Freq: Two times a day (BID) | CUTANEOUS | Status: DC

## 2014-07-11 NOTE — Telephone Encounter (Signed)
Rx sent 

## 2014-11-02 ENCOUNTER — Encounter: Payer: Self-pay | Admitting: Family Medicine

## 2014-11-02 ENCOUNTER — Ambulatory Visit (INDEPENDENT_AMBULATORY_CARE_PROVIDER_SITE_OTHER): Payer: 59 | Admitting: Family Medicine

## 2014-11-02 VITALS — BP 146/97 | HR 90 | Ht 65.0 in | Wt 251.0 lb

## 2014-11-02 DIAGNOSIS — A084 Viral intestinal infection, unspecified: Secondary | ICD-10-CM

## 2014-11-02 MED ORDER — ONDANSETRON 4 MG PO TBDP: 4.0000 mg | ORAL_TABLET | Freq: Three times a day (TID) | ORAL | Status: DC | PRN

## 2014-11-02 NOTE — Patient Instructions (Signed)

## 2014-11-02 NOTE — Progress Notes (Signed)
  Subjective:     Erin Gallagher is a 44 y.o. female who presents for evaluation of nonbilious vomiting 1 times per day, diarrhea 2 times per day and nausea. Symptoms have been present for 5 days. Patient denies blood in stool, fever, hematemesis, hematuria and melena. Patient's oral intake has been normal. Patient's urine output has been adequate. 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 Patient denies recent travel history. Patient has not had recent ingestion of possible contaminated food, toxic plants, or inappropriate medications/poisons.   The following portions of the patient's history were reviewed and updated as appropriate: allergies, current medications, past family history, past medical history, past social history, past surgical history and problem list.  Review of Systems Pertinent items are noted in HPI.    Objective:     BP 146/97 mmHg  Pulse 90  Ht 5\' 5"  (1.651 m)  Wt 251 lb (113.853 kg)  BMI 41.77 kg/m2 General appearance: alert, cooperative and appears stated age Throat: O/P clear Abdomen: soft, non-tender; bowel sounds normal; no masses,  no organomegaly    Assessment:    Acute Gastroenteritis    Plan:    1. Discussed oral rehydration, reintroduction of solid foods, signs of dehydration. 2. Return or go to emergency department if worsening symptoms, blood or bile, signs of dehydration, diarrhea lasting longer than 5 days or any new concerns. 3. Follow up in 7 days or sooner as needed.

## 2014-11-28 ENCOUNTER — Ambulatory Visit (INDEPENDENT_AMBULATORY_CARE_PROVIDER_SITE_OTHER): Payer: 59 | Admitting: Family Medicine

## 2014-11-28 ENCOUNTER — Encounter: Payer: Self-pay | Admitting: Family Medicine

## 2014-11-28 DIAGNOSIS — B9689 Other specified bacterial agents as the cause of diseases classified elsewhere: Secondary | ICD-10-CM | POA: Insufficient documentation

## 2014-11-28 DIAGNOSIS — M25562 Pain in left knee: Secondary | ICD-10-CM | POA: Diagnosis not present

## 2014-11-28 DIAGNOSIS — J019 Acute sinusitis, unspecified: Secondary | ICD-10-CM | POA: Insufficient documentation

## 2014-11-28 DIAGNOSIS — M79605 Pain in left leg: Secondary | ICD-10-CM | POA: Insufficient documentation

## 2014-11-28 MED ORDER — AMOXICILLIN-POT CLAVULANATE 875-125 MG PO TABS: 1.0000 | ORAL_TABLET | Freq: Two times a day (BID) | ORAL | Status: DC

## 2014-11-28 NOTE — Patient Instructions (Signed)
Great to meet you!  You have a sinus infection, be sure to finish your antibiotics  Try aleve and ice for your knee pain Get your X ray, you should hear from sports med for an appt within a week or so  I will send a letter with your knee x ray results

## 2014-11-28 NOTE — Progress Notes (Signed)
Patient ID: Erin Gallagher, female   DOB: Jun 16, 1971, 44 y.o.   MRN: [id number]   HPI  Patient presents today for an appointment for sinus pressure  Patient explains that her symptoms started about 3 days ago. She describes bilateral facial pressure and pain and frontal sinus pain as well. She denies fever but has had off-and-on chills, decreased appetite, and malaise.  She states that she is breathing easily, denies cough, and chest pain.  Knee pain States that she's had knee pain for several months. She describes that she has popping symptoms frequently. She has pain at the end of the long day as well as in the morning before she gets out of bed. She's tried Aleve which helped some. She's never had an x-ray for formal evaluation for knee pain. She states that her knee in general aches and has a hard time localizing the pain. She says occasionally the pain is very sharp in her left posterior calf and radiates up to her knee but most the time she has general knee pain with popping symptoms above. She denies locking symptoms.  ROS: Per HPI  Objective: BP 131/87 mmHg  Pulse 97  Temp(Src) 98.4 F (36.9 C) (Oral)  Ht 5\' 5"  (1.651 m)  Wt 249 lb (112.946 kg)  BMI 41.44 kg/m2  LMP 10/30/2014 Gen: NAD, alert, cooperative with exam HEENT: NCAT, Tms WNL BL, oropharynx clear, NAres with swollen turbinates and bluish boggy mucosa in bilaterally CV: RRR, no murmur Resp: CTABL, no wheezes, non-labored Ext: No edema, warm Neuro: Alert and oriented, normal gait MSK: L knee without erythema, effusion, bruising, or gross deformity  No joint line tenderness.      Assessment and plan:  Left knee pain L knee pain, symptoms consitent with meniscal injury Plain film to eval for OA Refer to Sports med for MSK [place] and further discussion, may be slightly unusal/different etiology given pain originates in calf with momentary exacerbations.     Acute bacterial sinusitis Symptoms c/w sinusitis Cover  with augmentin, discussed reasons for return    Orders Placed This Encounter  Procedures  . DG Knee Bilateral Standing AP    Standing Status: Future     Number of Occurrences:      Standing Expiration Date: 01/28/2016    Order Specific Question:  Reason for Exam (SYMPTOM  OR DIAGNOSIS REQUIRED)    Answer:  knee pain, OA eval on L    Order Specific Question:  Is the patient pregnant?    Answer:  No    Order Specific Question:  Preferred imaging location?    Answer:  [facility]  . Ambulatory referral to Sports Medicine    Referral Priority:  Routine    Referral Type:  Consultation    Number of Visits Requested:  1    Meds ordered this encounter  Medications  . amoxicillin-clavulanate (AUGMENTIN) 875-125 MG per tablet    Sig: Take 1 tablet by mouth 2 (two) times daily.    Dispense:  14 tablet    Refill:  0

## 2014-11-28 NOTE — Assessment & Plan Note (Signed)
L knee pain, symptoms consitent with meniscal injury Plain film to eval for OA Refer to Sports med for MS[facility] [place] and further discussion, may be slightly unusal/different etiology given pain originates in calf with momentary exacerbations.

## 2014-11-28 NOTE — Assessment & Plan Note (Signed)
Symptoms c/w sinusitis Cover with augmentin, discussed reasons for return

## 2014-11-29 NOTE — Progress Notes (Signed)
I was preceptor for this office visit.  

## 2014-11-30 ENCOUNTER — Ambulatory Visit
Admission: RE | Admit: 2014-11-30 | Discharge: 2014-11-30 | Disposition: A | Discharge status: Home / Self Care | Payer: 59 | Source: Ambulatory Visit | Attending: Family Medicine | Admitting: Family Medicine

## 2014-11-30 DIAGNOSIS — M25562 Pain in left knee: Secondary | ICD-10-CM

## 2014-11-30 NOTE — Addendum Note (Signed)
Addended by: Levert Feinstein F on: 11/30/2014 03:43 PM   Modules accepted: Orders

## 2014-12-01 ENCOUNTER — Encounter: Payer: Self-pay | Admitting: Family Medicine

## 2014-12-05 ENCOUNTER — Encounter: Payer: Self-pay | Admitting: Sports Medicine

## 2014-12-05 ENCOUNTER — Ambulatory Visit (INDEPENDENT_AMBULATORY_CARE_PROVIDER_SITE_OTHER): Payer: 59 | Admitting: Sports Medicine

## 2014-12-05 VITALS — BP 139/86 | HR 84 | Ht 66.0 in | Wt 246.0 lb

## 2014-12-05 DIAGNOSIS — M25562 Pain in left knee: Secondary | ICD-10-CM | POA: Diagnosis not present

## 2014-12-05 MED ORDER — MELOXICAM 15 MG PO TABS: 15.0000 mg | ORAL_TABLET | Freq: Every day | ORAL | Status: DC

## 2014-12-05 NOTE — Assessment & Plan Note (Signed)
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 Mild meniscus protrusion with surrounding hypoechoic fluid. X-rays also significant for early degenerative changes of the medial and patellofemoral compartment Treatment options were discussed today, including trial of a corticosteroid injection in the left knee. The patient wishes to hold off on injection of this time.  -Home exercise program discussed. -Body helix compression sleeve was fitted and applied to the left knee. -Continue to ice and elevate as needed. -Prescribed meloxicam once daily with food if needed. Discussed possibly trying a Medrol Dosepak, but she is not with acute flare, so we elected to hold off of this time.  -Plan to follow-up in 4 weeks or sooner if needed. If her pain persists, I would encourage her more to try a corticosteroid injection. Ultimately if her symptoms do not resolve despite cortisone injection, she may consider an MRI of the left knee if she is planning possible surgical intervention.

## 2014-12-05 NOTE — Progress Notes (Signed)
   Subjective:    Patient ID: Erin Gallagher, female    DOB: 01-23-1971, 44 y.o.   MRN: [id number]  HPI Erin Gallagher is a 44 year old female who presents with left knee pain. Onset was 4-5 months ago without any known acute injury. She notices her pain is an aching quality that is worse at night and first thing in the morning. She notes some associated anterior knee popping. Symptoms are aggravated with walking up and down stairs. She has tried taking ibuprofen with little relief. She tries to remain active at the gym, but has been unable to exercise recently due to her pain. She works on her feet at times as well. She denies any instability or giving way. Occasionally she may notice some swelling. Location of pain is primarily anterior knee, but also involves the posterior medial joint line as well. She denies any fevers, chills, significant hip pain, or deep groin pain. No numbness or tingling.  Past medical history, social history, medications, and allergies were reviewed and are up to date in the chart.  Review of Systems 7 point review of systems was performed and was otherwise negative unless noted in the history of present illness.     Objective:   Physical Exam BP 139/86 mmHg  Pulse 84  Ht 5\' 6"  (1.676 m)  Wt 246 lb (111.585 kg)  BMI 39.72 kg/m2  LMP 10/30/2014 GEN: The patient is well-developed well-nourished female and in no acute distress.  She is awake alert and oriented x3. SKIN: warm and well-perfused, no rash  EXTR: No lower extremity edema or calf tenderness Neuro: Strength 5/5 globally. Sensation intact throughout. No focal deficits. Vasc: +2 bilateral distal pulses. No edema.  MSK: Examination of the left knee reveals range of motion from 0-120 without pain. No swelling. No overlying erythema or induration. Positive patellar grind test. Medial joint line tenderness to palpation. Negative McMurray's. No laxity with valgus or varus stress testing. Good hamstring strength.  Slightly weakened quadriceps. No atrophy. Full range of motion at the hip joint. Patellar and quadriceps tendons palpably intact.  Limited musculoskeletal ultrasound: Long and short axis views were obtained of the left knee. No effusion. Quadriceps and patellar tendons intact. Slight narrowing and degenerative change at the medial joint line with slight protrusion of the medial meniscus with surrounding hypoechoic fluid. Exam is slightly limited due to patient's body habitus.  X-rays 11/30/14: Bilateral standing knee x-rays were reviewed significant for slight degenerative changes at the bilateral medial and patellofemoral compartments. No acute fractures or other osseous abnormality.     Assessment & Plan:  Please see problem based assessment and plan in the problem list.

## 2014-12-05 NOTE — Patient Instructions (Signed)
Some exercises to rehab your knee include: -Straight leg raises: 10 times x 3 sets at least 3-5 times a week -Recumbent bike or elliptical machine 3-5 times a week. -Avoid deep lunges, squats, or bent-knee exercises  -Wear the body helix compression sleeve when active until your knee feels improved. -Continue to ice and elevate as needed. -Take meloxicam once daily with food if needed, as this is good for your type of knee pain.  -Plan to follow-up in 4 weeks or sooner if needed.

## 2015-01-02 ENCOUNTER — Ambulatory Visit: Payer: 59 | Admitting: Sports Medicine

## 2015-04-11 ENCOUNTER — Ambulatory Visit (INDEPENDENT_AMBULATORY_CARE_PROVIDER_SITE_OTHER): Payer: 59 | Admitting: Family Medicine

## 2015-04-11 VITALS — BP 136/95 | Temp 98.1°F | Wt 247.9 lb

## 2015-04-11 DIAGNOSIS — M545 Low back pain, unspecified: Secondary | ICD-10-CM

## 2015-04-11 LAB — POCT URINALYSIS DIPSTICK
BILIRUBIN UA: NEGATIVE
Blood, UA: NEGATIVE
GLUCOSE UA: NEGATIVE
Leukocytes, UA: NEGATIVE
Nitrite, UA: NEGATIVE
PH UA: 5.5
Protein, UA: NEGATIVE
Spec Grav, UA: >=1.03
Urobilinogen, UA: 0.2

## 2015-04-11 MED ORDER — CYCLOBENZAPRINE HCL 5 MG PO TABS: 5.0000 mg | ORAL_TABLET | Freq: Every day | ORAL | Status: DC

## 2015-04-11 NOTE — Progress Notes (Signed)
    Subjective   Erin Gallagher is a 44 y.o. female that presents for a same day visit  1. Low right back pain: Started a few months ago. She has recently started working out more (walking mostly). Symptoms have remained unchanged. Pain is worse in the morning and improves with movement. She has placed biofreeze which helps with her pain. The pain has not limited her ability to move. No fevers, no dysuria. She does have urgency which has been present for 6 months. No frequency.   ROS Per HPI  Social History  Substance Use Topics  . Smoking status: Never Smoker   . Smokeless tobacco: Not on file  . Alcohol Use: 1.2 oz/week    2 Cans of beer per week    No Known Allergies  Objective   BP 136/95 mmHg  Temp(Src) 98.1 F (36.7 C) (Oral)  Wt 247 lb 14.4 oz (112.447 kg)  LMP 04/03/2015  General: Well appearing, no distress Genitourinary: No CVA tenderness    Musculoskeletal: No tenderness to palpation of lower back or flank. Full range of motion of back.   Assessment and Plan   Meds ordered this encounter  Medications  . cyclobenzaprine (FLEXERIL) 5 MG tablet    Sig: Take 1 tablet (5 mg total) by mouth at bedtime.    Dispense:  15 tablet    Refill:  0    Back pain: unsure of etiology. Patient states she was recently told she has rheumatoid arthritis. Doubt this is related, although pain is worse in the morning with relative relief later one. Patient's main concern appears to be making sure it is not a kidney infection, which this does not appear to be. Urinalysis only significant for high specific gravity and low ketones.  Flexeril 5mg  qhs  Back exercises given  Recommended stretching before and after work outs  Return if worsening

## 2015-04-11 NOTE — Patient Instructions (Addendum)
Thank you for coming to see me today. It was a pleasure. Today we talked about:   Back pain: I am unsure of what is causing your back pain, but this may be related to muscles. Since it worsens overnight, this may be related to your bed or how you sleep. For now, I will prescribe a muscle relaxer for you to take before bed to see if that helps. Your urine was not significant for an infection, but did show you may be dehydrated/in a fasting state.  Please make an appointment to see Dr. Gerlean Ren  If you have any questions or concerns, please do not hesitate to call the office at [phone].  Sincerely,  Cordelia Poche, MD

## 2015-04-12 ENCOUNTER — Encounter: Payer: Self-pay | Admitting: Family Medicine

## 2015-07-12 ENCOUNTER — Other Ambulatory Visit: Payer: Self-pay | Admitting: Family Medicine

## 2015-07-12 NOTE — Telephone Encounter (Signed)
Dr. Lacinda Axon has moved to a new office, please advise refill, Thanks Lavella Lemons, RN

## 2015-08-21 ENCOUNTER — Other Ambulatory Visit: Payer: Self-pay | Admitting: Family Medicine

## 2015-08-21 NOTE — Telephone Encounter (Signed)
This was sent to [place] by mistake.

## 2015-09-17 DIAGNOSIS — L300 Nummular dermatitis: Secondary | ICD-10-CM | POA: Diagnosis not present

## 2015-09-17 MED FILL — TRIAMCINOLONE 0.1% CREAM: 0.1 | 30 days supply | Qty: 454 | Fill #0

## 2015-09-17 MED FILL — hydrOXYzine HCL 10 MG TABS: 10 | 20 days supply | Qty: 60 | Fill #0

## 2015-10-15 MED FILL — LOSARTAN POTASSIUM 50 MG TA: 50 | 90 days supply | Qty: 90 | Fill #1

## 2015-11-05 MED FILL — PERMETHRIN 5% CREAM: 5 | 1 days supply | Qty: 60 | Fill #0

## 2015-11-12 MED FILL — PERMETHRIN 5% CREAM: 5 | 1 days supply | Qty: 60 | Fill #1

## 2015-11-13 ENCOUNTER — Encounter: Payer: Self-pay | Admitting: Family Medicine

## 2015-11-13 ENCOUNTER — Ambulatory Visit (INDEPENDENT_AMBULATORY_CARE_PROVIDER_SITE_OTHER): Payer: 59 | Admitting: Family Medicine

## 2015-11-13 VITALS — BP 146/86 | HR 98 | Temp 98.3°F | Wt 248.0 lb

## 2015-11-13 DIAGNOSIS — J069 Acute upper respiratory infection, unspecified: Secondary | ICD-10-CM

## 2015-11-13 NOTE — Progress Notes (Signed)
   Subjective:    Patient ID: Erin Gallagher, female    DOB: 11/20/1970, 45 y.o.   MRN: [id number]  Seen for Same day visit for   CC: nasal congestion  URI  Has been sick for 3 days. Nasal discharge: thin Medications tried: none Sick contacts: yes - flu and pna at work  Symptoms Fever: no Headache or face pain: no Tooth pain: no Sneezing: yes Scratchy throat: yes Allergies: no Muscle aches: no Severe fatigue: no Stiff neck: no Shortness of breath: no Rash: no Sore throat or swollen glands: yes  ROS see HPI Smoking Status noted  Objective:  BP 146/86 mmHg  Pulse 98  Temp(Src) 98.3 F (36.8 C) (Oral)  Wt 248 lb (112.492 kg)  LMP 11/07/2015 (Exact Date)  General: NAD HEENT: Sclera clear, TMs clear bilaterally, pharyngeal erythema with exudates, no cervical adenopathy Cardiac: RRR, normal heart sounds, no murmurs. 2+ radial and PT pulses bilaterally Respiratory: CTAB, normal effort Abdomen: soft, nontender, nondistended, no hepatic or splenomegaly. Bowel sounds present Extremities: no edema or cyanosis. WWP. Skin: warm and dry, no rashes noted    Assessment & Plan:   1. Acute URI Symptoms consistent with acute URI versus allergies.  - Advised symptomatic treatment with oral hydration, Tea w/ honey, ibuprofen as needed - Follow-up in 1-2 weeks if not improving; would recommend starting Flonase and allergy medication at that time

## 2015-11-20 DIAGNOSIS — H524 Presbyopia: Secondary | ICD-10-CM | POA: Diagnosis not present

## 2015-11-20 DIAGNOSIS — H52203 Unspecified astigmatism, bilateral: Secondary | ICD-10-CM | POA: Diagnosis not present

## 2015-12-07 ENCOUNTER — Other Ambulatory Visit: Payer: Self-pay | Admitting: Family Medicine

## 2015-12-07 MED FILL — AMLODIPINE BESYLATE 10 MG T: 10 | 90 days supply | Qty: 90 | Fill #0

## 2015-12-31 ENCOUNTER — Encounter: Payer: Self-pay | Admitting: Family Medicine

## 2015-12-31 ENCOUNTER — Ambulatory Visit (INDEPENDENT_AMBULATORY_CARE_PROVIDER_SITE_OTHER): Payer: 59 | Admitting: Family Medicine

## 2015-12-31 VITALS — BP 139/90 | HR 89 | Temp 97.9°F | Wt 249.0 lb

## 2015-12-31 DIAGNOSIS — M545 Low back pain, unspecified: Secondary | ICD-10-CM | POA: Insufficient documentation

## 2015-12-31 MED ORDER — NAPROXEN 500 MG PO TABS: 500.0000 mg | ORAL_TABLET | Freq: Two times a day (BID) | ORAL | Status: DC

## 2015-12-31 MED ORDER — CYCLOBENZAPRINE HCL 10 MG PO TABS: 5.0000 mg | ORAL_TABLET | Freq: Three times a day (TID) | ORAL | Status: DC | PRN

## 2015-12-31 MED FILL — NAPROXEN 500 MG TABLET: 500 | 30 days supply | Qty: 60 | Fill #0

## 2015-12-31 MED FILL — CYCLOBENZAPRINE 10 MG TAB: 10 | 10 days supply | Qty: 30 | Fill #0

## 2015-12-31 NOTE — Patient Instructions (Signed)
Thank you for coming in to clinic today.  1. For your Back Pain - I think that this is due to Muscle Spasms or strain. You did NOT have Sciatic Nerve symptoms, but this can be affected causing some of your radiation and numbness down your legs. 2. Start with anti-inflammatory Naprosyn (Naproxen) 500mg  twice daily (12 hrs apart, with food, breakfast and dinner) every day for next 2 to 4 weeks if helping, then can use only as needed 3. Start Cyclobenzapine (Flexeril) 10mg  tablets - take whole or cut in half for 5mg  at night for muscle relaxant - may make you sedated or sleepy (be careful driving or working on this) if tolerated you can take every 8 hours, half or whole tab 4. May use Tylenol Extra Str 500mg  tabs - may take 1-2 tablets every 6 hours as needed 5. Recommend to start using heating pad and/or muscle rub on your lower back 1-2x daily for few weeks  Ordered X-rays at [facility]  This pain may take weeks to months to fully resolve, but hopefully it will respond to the medicine initially. All back injuries (small or serious) are slow to heal since we use our back muscles every day. Be careful with turning, twisting, lifting, sitting / standing for prolonged periods, and avoid re-injury.  If your symptoms significantly worsen with more pain, or new symptoms with weakness in one or both legs, new or different shooting leg pains, numbness in legs or groin, loss of control or retention of urine or bowel movements, please call back for advice and you may need to go directly to the Emergency Department.   Please schedule a follow-up appointment with Dr Gerlean Ren within 4 weeks to follow-up Low Back Pain  If you have any other questions or concerns, please feel free to call the clinic to contact me. You may also schedule an earlier appointment if necessary.  However, if your symptoms get significantly worse, please go to the Emergency Department to seek immediate medical  attention.  Nobie Putnam, [place]

## 2015-12-31 NOTE — Progress Notes (Signed)
Subjective:    Patient ID: Erin Gallagher, female    DOB: [birth year], 45 y.o.   MRN: [id number]  Erin Gallagher is a 45 y.o. female presenting on 12/31/2015 for Back Pain   Patient presents for a same day appointment.   HPI  RIGHT LOWER BACK PAIN, ACUTE ON CHRONIC: Symptoms started about 1 week ago, low back pain with gradual onset, then seemed to move to Right side of low back and radiated down to Right hip/buttock, due to this pain in morning caused her to "slide out of bed" to get up and go to work. Thought that she may have "strained" her back but denies any clear injury or trauma, fall. Severity up to 8/10 but currently 5/10 mostly constant, worse with activity at first with stiffness, but then improves gradually  - Prior history with Right sided low back pain similar but not as severe as current episode 04/2015 - Tried Bayer back & body extra strength with mild relief, but did not resolve (tried for 2-3 days), then tried OTC muscle rub with a little relief. 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 No prolonged sitting or standing. - Denies any fevers/chills, trauma, injury, saddle anesthesia, bowel or bladder incontinence, radiating leg pains or numbness/tingling   Social History  Substance Use Topics  . Smoking status: Never Smoker   . Smokeless tobacco: None  . Alcohol Use: 1.2 oz/week    2 Cans of beer per week    Review of Systems Per HPI unless specifically indicated above     Objective:    BP 139/90 mmHg  Pulse 89  Temp(Src) 97.9 F (36.6 C) (Oral)  Wt 249 lb (112.946 kg)  Wt Readings from Last 3 Encounters:  12/31/15 249 lb (112.946 kg)  11/13/15 248 lb (112.492 kg)  04/11/15 247 lb 14.4 oz (112.447 kg)    Physical Exam  Constitutional: She appears well-developed and well-nourished. No distress.  Overweight, well-appearing, comfortable, cooperative, pleasant    Cardiovascular: Normal rate and intact distal pulses.   Pulmonary/Chest: Effort normal.  Musculoskeletal: She exhibits no edema.  Low Back Inspection: Large body habitus but no obvious bony or muscle deformity. Does seem to have possibly increased lumbar lordosis with seated posture. Palpation: Mild muscle hypertonicity and spasm with Right lower lumbar paraspinal and piriformis region with mild tenderness on deeper palpation. ROM: Full active forward flex / back extension, rotation. Special Testing: Seated SLR with reproduced Right localized LBP but negative radiculopathy. Strength: hip flex/ext, knee flex/ext, and ankles 5/5 bilaterally symmetric Neurovascular: Distally intact   Neurological: She is alert.  Skin: Skin is warm and dry. No rash noted. She is not diaphoretic.  Nursing note and vitals reviewed.   Seated SLR R localized pain negative    Assessment & Plan:   Problem List Items Addressed This Visit    Right-sided low back pain without sciatica - Primary    Consistent with acute on chronic R LBP without associated sciatica. Suspect likely due to muscle spasm/strain, without known injury or trauma. In setting of known recent chronic LBP with 1-2 prior low back flares. Known OA in bilateral knees but no prior imaging of back. Suspect underlying DJD - No red flag symptoms. Negative SLR for radiculopathy - Inadequate conservative therapy  Plan: 1. Ordered Lumbar X-rays given seems to be chronic problem with 1-2 prior flare-ups since 04/2015, duration >6 mo, eval for underlying DJD 2. Start  anti-inflammatory trial with rx Naprosyn 500mg  BID wc x 2-4 weeks, then PRN 3. Start muscle relaxant with Flexeril 10mg  tabs - take 5-10mg  up to TID PRN, titrate up as tolerated (had improved LBP previously) 4. May use Tylenol PRN for breakthrough 5. Encouraged use of heating pad 1-2x daily for now then PRN 6. RTC 1 month, re-evaluation. If not improved consider trial of PT for  strengthening.       Relevant Medications   cyclobenzaprine (FLEXERIL) 10 MG tablet   naproxen (NAPROSYN) 500 MG tablet   Other Relevant Orders   DG Lumbar Spine Complete      Meds ordered this encounter  Medications  . cyclobenzaprine (FLEXERIL) 10 MG tablet    Sig: Take 0.5-1 tablets (5-10 mg total) by mouth 3 (three) times daily as needed for muscle spasms.    Dispense:  30 tablet    Refill:  0  . naproxen (NAPROSYN) 500 MG tablet    Sig: Take 1 tablet (500 mg total) by mouth 2 (two) times daily with a meal. For 2 weeks, then as needed    Dispense:  60 tablet    Refill:  1      Follow up plan: Return in about 4 weeks (around 01/28/2016) for low back pain.  Nobie Putnam, [place], PGY-3

## 2015-12-31 NOTE — Assessment & Plan Note (Signed)
Consistent with acute on chronic R LBP without associated sciatica. Suspect likely due to muscle spasm/strain, without known injury or trauma. In setting of known recent chronic LBP with 1-2 prior low back flares. Known OA in bilateral knees but no prior imaging of back. Suspect underlying DJD - No red flag symptoms. Negative SLR for radiculopathy - Inadequate conservative therapy  Plan: 1. Ordered Lumbar X-rays given seems to be chronic problem with 1-2 prior flare-ups since 04/2015, duration >6 mo, eval for underlying DJD 2. Start anti-inflammatory trial with rx Naprosyn 500mg  BID wc x 2-4 weeks, then PRN 3. Start muscle relaxant with Flexeril 10mg  tabs - take 5-10mg  up to TID PRN, titrate up as tolerated (had improved LBP previously) 4. May use Tylenol PRN for breakthrough 5. Encouraged use of heating pad 1-2x daily for now then PRN 6. RTC 1 month, re-evaluation. If not improved consider trial of PT for strengthening.

## 2016-01-02 ENCOUNTER — Ambulatory Visit
Admission: RE | Admit: 2016-01-02 | Discharge: 2016-01-02 | Disposition: A | Discharge status: Home / Self Care | Payer: 59 | Source: Ambulatory Visit | Attending: Family Medicine | Admitting: Family Medicine

## 2016-01-02 DIAGNOSIS — M545 Low back pain, unspecified: Secondary | ICD-10-CM

## 2016-01-02 DIAGNOSIS — M47816 Spondylosis without myelopathy or radiculopathy, lumbar region: Secondary | ICD-10-CM | POA: Diagnosis not present

## 2016-01-03 ENCOUNTER — Telehealth: Payer: Self-pay | Admitting: Family Medicine

## 2016-01-03 NOTE — Telephone Encounter (Signed)
Called patient to review Lumbar X-rays, last office visit 5/1, see note for details. Suspected Lumbar OA with acute on chronic LBP. No prior x-rays to compare to but radiology looked at [facility] in 2012. See radiology results below, personally reviewed films seems consistent with diffuse DJD significant loss of disc space L4-L5, given symptoms likely progression of chronic arthritis however may be stable and chronic. No symptoms of acute radicular pain or nerve compression, not consistent with disc herniation by history, advised that we will continue to treat her with same plan as discussed on 5/1 and follow-up as planned. In future if significant worsening or new concerns for disc or radicular symptoms, would likely pursue MRI.  01/02/16 Lumbar X-ray FINDINGS: Diffuse multilevel degenerative change. Degenerative changes most prominent at L4-L5 with prominent disc space loss and endplate osteophyte formation noted at this level. No acute or focal bony abnormality. Pelvic calcifications consistent phleboliths.  IMPRESSION: Diffuse multilevel degenerative change. Degenerative changes most prominent at L4-L5 with prominent disc space loss and endplate osteophyte formation at this level.  Nobie Putnam, [place], PGY-3

## 2016-01-15 MED FILL — LOSARTAN POTASSIUM 50 MG TA: 50 | 90 days supply | Qty: 90 | Fill #2

## 2016-03-24 MED FILL — AMLODIPINE BESYLATE 10 MG T: 10 | 90 days supply | Qty: 90 | Fill #1

## 2016-04-10 MED FILL — LOSARTAN POTASSIUM 50 MG TA: 50 | 90 days supply | Qty: 90 | Fill #3

## 2016-07-01 MED FILL — AMLODIPINE BESYLATE 10 MG T: 10 | 90 days supply | Qty: 90 | Fill #2

## 2016-07-14 ENCOUNTER — Other Ambulatory Visit: Payer: Self-pay | Admitting: Family Medicine

## 2016-07-14 MED FILL — hydrOXYzine HCL 10 MG TABS: 10 | 20 days supply | Qty: 60 | Fill #1

## 2016-07-14 MED FILL — LOSARTAN POTASSIUM 50 MG TA: 50 | 90 days supply | Qty: 90 | Fill #0

## 2016-07-17 ENCOUNTER — Ambulatory Visit (INDEPENDENT_AMBULATORY_CARE_PROVIDER_SITE_OTHER): Payer: 59 | Admitting: Family Medicine

## 2016-07-17 ENCOUNTER — Encounter: Payer: Self-pay | Admitting: Family Medicine

## 2016-07-17 VITALS — BP 140/90 | HR 89 | Temp 97.9°F | Ht 66.0 in | Wt 248.4 lb

## 2016-07-17 DIAGNOSIS — R1013 Epigastric pain: Secondary | ICD-10-CM | POA: Diagnosis not present

## 2016-07-17 DIAGNOSIS — K921 Melena: Secondary | ICD-10-CM

## 2016-07-17 DIAGNOSIS — Z1211 Encounter for screening for malignant neoplasm of colon: Secondary | ICD-10-CM

## 2016-07-17 LAB — POCT URINALYSIS DIPSTICK
BILIRUBIN UA: NEGATIVE
Blood, UA: NEGATIVE
GLUCOSE UA: NEGATIVE
LEUKOCYTES UA: NEGATIVE
Nitrite, UA: NEGATIVE
PROTEIN UA: NEGATIVE
SPEC GRAV UA: 1.02
Urobilinogen, UA: 0.2
pH, UA: 6.5

## 2016-07-17 LAB — HEMOCCULT GUIAC POC 1CARD (OFFICE): Fecal Occult Blood, POC: NEGATIVE

## 2016-07-17 MED ORDER — PANTOPRAZOLE SODIUM 40 MG PO TBEC: 40.0000 mg | DELAYED_RELEASE_TABLET | Freq: Two times a day (BID) | ORAL | 0 refills | Status: DC

## 2016-07-17 MED FILL — PANTOPRAZOLE SOD DR 40 MG T: 40 | 30 days supply | Qty: 60 | Fill #0

## 2016-07-17 NOTE — Patient Instructions (Addendum)
Thank you so much for coming to visit today! Please take Protonix twice daily for your abdominal pain. If no improvement or significant worsening over the next 2 weeks, please let me know. I have placed a referral to GI for colonoscopy. If your hip pain worsens, please let me know. Please avoid Aleve or Ibuprofen for your pain and take Tylenol instead. Return for lab visit to obtain blood level. Return in 1 month or sooner if needed for next office visit.  Dr. Gerlean Ren

## 2016-07-17 NOTE — Progress Notes (Signed)
Subjective:     Patient ID: Erin Gallagher, female   DOB: [birth year], 45 y.o.   MRN: [id number]  HPI Erin Gallagher is a 45yo female presenting today for epigastric abdominal pain and bloody stools. - Chart review shows history of similar symptoms in 07/2014, resolved with Protonix - No longer takes Protonix - Reports nagging pain in epigastric area for 3 weeks. Reports it is always present, but worsens every once in a while - Denies nausea and vomiting, fever, chest pain - Does not seem to be correlated with food - Worse when she first gets up in the morning - Denies any increased stress - Reports history of cholecystectomy - Reports 2 bowel movements per day. Light brown to dark color. Blood can occur at beginning or end of bowel movement. - Denies straining. Bowel movements not painful. - Denies family history of colon cancer - No history of colonoscopy - Denies dizziness, shortness of breath, fatigue  Review of Systems Per HPI    Objective:   Physical Exam  Constitutional: She appears well-developed and well-nourished. No distress.  HENT:  Head: Normocephalic and atraumatic.  Mouth/Throat: Oropharynx is clear and moist.  Conjunctiva pink  Cardiovascular: Normal rate.   No murmur heard. Pulmonary/Chest: Effort normal and breath sounds normal. She has no wheezes.  Abdominal: Soft. She exhibits no distension.  Epigastric tenderness. Negative Murphy's. Negative Rebound. Negative Rovsings.  Genitourinary:  Genitourinary Comments: Hemorrhoid noted  Musculoskeletal: Normal range of motion. She exhibits no edema.  Skin: No rash noted.  Psychiatric: She has a normal mood and affect. Her behavior is normal.       Assessment and Plan:     Epigastric Pain, Blood in Stool - Discussed this could be related (gastric ulcer) or two separate conditions (GERD + Hemorrhoid or Colon Cancer). - Anoscopy with hemorrhoid noted. Discussed that while this may explain bleeding, we should further  evaluate to make something more sinister is not present - FOBT negative. Home stool cards given x3 - CBC ordered. Lab closed. To come to lab when she returns stool cards to have CBC drawn. - Urinalysis normal.  - Relief with Protonix noted in past. Protonix restarted at treatment dose. - Referral to GI for colonoscopy - Discussed avoiding NSAIDs. Recommend Tylenol for pain. - Follow up in one month or sooner if symptoms worsen

## 2016-07-21 ENCOUNTER — Encounter: Payer: Self-pay | Admitting: Family Medicine

## 2016-07-21 ENCOUNTER — Encounter: Payer: Self-pay | Admitting: Physician Assistant

## 2016-07-23 LAB — POC HEMOCCULT BLD/STL (HOME/3-CARD/SCREEN)
Card #2 Fecal Occult Blod, POC: NEGATIVE
FECAL OCCULT BLD: NEGATIVE
Fecal Occult Blood, POC: NEGATIVE

## 2016-07-23 NOTE — Addendum Note (Signed)
Addended by: [place] Pink on: 07/23/2016 10:09 AM   Modules accepted: Orders

## 2016-07-30 ENCOUNTER — Ambulatory Visit (INDEPENDENT_AMBULATORY_CARE_PROVIDER_SITE_OTHER): Payer: 59 | Admitting: Physician Assistant

## 2016-07-30 ENCOUNTER — Other Ambulatory Visit (INDEPENDENT_AMBULATORY_CARE_PROVIDER_SITE_OTHER): Payer: 59

## 2016-07-30 ENCOUNTER — Encounter: Payer: Self-pay | Admitting: Physician Assistant

## 2016-07-30 VITALS — BP 98/86 | HR 68 | Ht 66.0 in | Wt 250.0 lb

## 2016-07-30 DIAGNOSIS — K625 Hemorrhage of anus and rectum: Secondary | ICD-10-CM | POA: Diagnosis not present

## 2016-07-30 DIAGNOSIS — R140 Abdominal distension (gaseous): Secondary | ICD-10-CM

## 2016-07-30 DIAGNOSIS — R1319 Other dysphagia: Secondary | ICD-10-CM

## 2016-07-30 DIAGNOSIS — R12 Heartburn: Secondary | ICD-10-CM

## 2016-07-30 DIAGNOSIS — Z800 Family history of malignant neoplasm of digestive organs: Secondary | ICD-10-CM

## 2016-07-30 DIAGNOSIS — R1013 Epigastric pain: Secondary | ICD-10-CM

## 2016-07-30 LAB — CBC WITH DIFFERENTIAL/PLATELET
Basophils Absolute: 0 10*3/uL (ref 0.0–0.1)
Basophils Relative: 0.2 % (ref 0.0–3.0)
EOS PCT: 2.3 % (ref 0.0–5.0)
Eosinophils Absolute: 0.2 10*3/uL (ref 0.0–0.7)
HEMATOCRIT: 41.9 % (ref 36.0–46.0)
HEMOGLOBIN: 13.7 g/dL (ref 12.0–15.0)
LYMPHS PCT: 24.9 % (ref 12.0–46.0)
Lymphs Abs: 2.1 10*3/uL (ref 0.7–4.0)
MCHC: 32.8 g/dL (ref 30.0–36.0)
MCV: 79.4 fl (ref 78.0–100.0)
MONO ABS: 0.5 10*3/uL (ref 0.1–1.0)
Monocytes Relative: 5.9 % (ref 3.0–12.0)
Neutro Abs: 5.7 10*3/uL (ref 1.4–7.7)
Neutrophils Relative %: 66.7 % (ref 43.0–77.0)
Platelets: 379 10*3/uL (ref 150.0–400.0)
RBC: 5.28 Mil/uL — AB (ref 3.87–5.11)
RDW: 15 % (ref 11.5–15.5)
WBC: 8.6 10*3/uL (ref 4.0–10.5)

## 2016-07-30 LAB — COMPREHENSIVE METABOLIC PANEL
ALBUMIN: 4.1 g/dL (ref 3.5–5.2)
ALK PHOS: 61 U/L (ref 39–117)
ALT: 15 U/L (ref 0–35)
AST: 15 U/L (ref 0–37)
BUN: 8 mg/dL (ref 6–23)
CO2: 28 mEq/L (ref 19–32)
CREATININE: 0.74 mg/dL (ref 0.40–1.20)
Calcium: 9.1 mg/dL (ref 8.4–10.5)
Chloride: 104 mEq/L (ref 96–112)
GFR: 108.7 mL/min (ref >60.00)
Glucose, Bld: 89 mg/dL (ref 70–99)
POTASSIUM: 4.1 meq/L (ref 3.5–5.1)
SODIUM: 140 meq/L (ref 135–145)
TOTAL PROTEIN: 7.6 g/dL (ref 6.0–8.3)
Total Bilirubin: 0.5 mg/dL (ref 0.2–1.2)

## 2016-07-30 MED ORDER — PANTOPRAZOLE SODIUM 40 MG PO TBEC: 40.0000 mg | DELAYED_RELEASE_TABLET | Freq: Every day | ORAL | 3 refills | Status: DC

## 2016-07-30 MED ORDER — NA SULFATE-K SULFATE-MG SULF 17.5-3.13-1.6 GM/177ML PO SOLN: 1.0000 | Freq: Once | ORAL | 0 refills | Status: AC

## 2016-07-30 NOTE — Patient Instructions (Addendum)
Please go to the basement level to have your labs drawn.  You have been scheduled for an endoscopy and colonoscopy. Please follow the written instructions given to you at your visit today. Please pick up your prep supplies at the pharmacy within the next 1-3 days. If you use inhalers (even only as needed), please bring them with you on the day of your procedure. Your physician has requested that you go to www.startemmi.com and enter the access code given to you at your visit today. This web site gives a general overview about your procedure. However, you should still follow specific instructions given to you by our office regarding your preparation for the procedure.  [facility] have provided you with Anti-reflux information.

## 2016-07-30 NOTE — Progress Notes (Addendum)
Subjective:    Patient ID: Erin Gallagher, female    DOB: [birth year], 45 y.o.   MRN: [id number]  HPI  Erin Gallagher is a pleasant 45 year old [place] American female, new to GI today referred by Junie Panning D.O./[place]  [facility] for evaluation of intermittent rectal bleeding and epigastric/subxiphoid discomfort. She has not had any prior GI evaluation, but did have a cholecystectomy greater than 10 years ago. Other medical problems include hypertension and obesity. She says she's had her symptoms for over a year and that she had just put off evaluation. She says she has daily symptoms with epigastric discomfort which she describes as heartburn and a loaded discomfort in her upper abdomen. She has also noticed some solid food dysphagia. Appetite has been fine, weight has been stable.  She is also noticed a rather generalized abdominal bloating and discomfort over the past several months and has been noticing bright  red blood on her bowel movements and occasionally on the tissue again intermittently. She is not having any problems with diarrhea, has had mild constipation. Family history is pertinent for father deceased from colon cancer in his 26s.  Patient was started on Protonix about 2 weeks ago by her primary care provider but hasn't noticed any changes as yet. She was also documented to be Hemoccult negative. She has not had labs done.  Review of Systems Pertinent positive and negative review of systems were noted in the above HPI section.  All other review of systems was otherwise negative.  Outpatient Encounter Prescriptions as of 07/30/2016  Medication Sig  . amLODipine (NORVASC) 10 MG tablet TAKE 1 TABLET BY MOUTH ONCE DAILY  . cyclobenzaprine (FLEXERIL) 10 MG tablet Take 0.5-1 tablets (5-10 mg total) by mouth 3 (three) times daily as needed for muscle spasms.  Marland Kitchen losartan (COZAAR) 50 MG tablet TAKE 1 TABLET BY MOUTH ONCE DAILY  . pantoprazole (PROTONIX) 40 MG tablet Take 1  tablet (40 mg total) by mouth daily.  Marland Kitchen triamcinolone ointment (KENALOG) 0.1 % Apply 1 application topically 2 (two) times daily. (Patient taking differently: Apply 1 application topically as needed. )  . [DISCONTINUED] pantoprazole (PROTONIX) 40 MG tablet Take 1 tablet (40 mg total) by mouth 2 (two) times daily.  . Na Sulfate-K Sulfate-Mg Sulf 17.5-3.13-1.6 GM/180ML SOLN Take 1 kit by mouth once.   No facility-administered encounter medications on file as of 07/30/2016.    No Known Allergies Patient Active Problem List   Diagnosis Date Noted  . Right-sided low back pain without sciatica 12/31/2015  . Left knee pain 11/28/2014  . HTN (hypertension) 06/22/2013  . Preventative health care 06/22/2013   Social History   Social History  . Marital status: Married    Spouse name: N/A  . Number of children: 1  . Years of education: N/A   Occupational History  . Supervisor    Social History Main Topics  . Smoking status: Never Smoker  . Smokeless tobacco: Never Used  . Alcohol use 1.2 oz/week    2 Cans of beer per week     Comment: occasionally  . Drug use: No  . Sexual activity: Yes    Birth control/ protection: Other-see comments, Surgical     Comment: Tubal ligation2   Other Topics Concern  . Not on file   Social History Narrative  . No narrative on file    Ms. Thompson's family history includes Colon cancer in her father; Heart disease in her father; Hypertension in her father, maternal grandmother,  and mother; Stroke in her maternal grandmother.      Objective:    Vitals:   07/30/16 0923  BP: 98/86  Pulse: 68    Physical Exam  well-developed African-American female in no acute distress, pleasant blood pressure 98/86 pulse 68,, height 5 foot 6, weight 250, BMI 40.3 HEENT; nontraumatic normocephalic EOMI PERRLA sclera anicteric, Cardiovascular ;regular rate and rhythm with S1-S2 no murmur or gallop, Pulmonary; clear bilaterally, Abdomen; obese soft she has some mild  tenderness in the epigastrium, there is no palpable mass or hepatosplenomegaly bowel sounds are present  Rectal ;exam not done, Extremities ;no clubbing cyanosis or edema skin warm and dry, Neuropsych ;mood and affect appropriate       Assessment & Plan:   #27 45 year old African-American female with intermittent bright red blood per rectum constipation 1 year. Rule out bleeding secondary to internal hemorrhoids, versus occult lesion. #2   family history of colon cancer in patient's father deceased mid 49s #3 epigastric/subxiphoid discomfort and mild solid food dysphagia.  Symptoms are consistent with GERD, and possible distal esophagitis, rule out stricture. #4 obesity-BMI 40 #5 status post cholecystectomy   #6 hypertension   Plan;  Continue  Protonix 40 mg by mouth daily Antireflux regimen Will schedule for EGD with possible dilation and colonoscopy with Dr. Loletha Carrow. Both procedures were discussed in detail with the patient including risks and benefits and she is agreeable to proceed. CBC, CMET today   Lucielle Vokes S Cardin Nitschke PA-C 07/30/2016   Cc: Lorna Few, DO   Thank you for sending this case to me. I have reviewed the entire note, and the outlined plan seems appropriate.

## 2016-08-05 ENCOUNTER — Ambulatory Visit (AMBULATORY_SURGERY_CENTER): Payer: 59 | Admitting: Gastroenterology

## 2016-08-05 ENCOUNTER — Encounter: Payer: Self-pay | Admitting: Gastroenterology

## 2016-08-05 VITALS — BP 97/72 | HR 69 | Temp 97.3°F | Resp 15 | Ht 66.0 in | Wt 250.0 lb

## 2016-08-05 DIAGNOSIS — I10 Essential (primary) hypertension: Secondary | ICD-10-CM | POA: Diagnosis not present

## 2016-08-05 DIAGNOSIS — K625 Hemorrhage of anus and rectum: Secondary | ICD-10-CM

## 2016-08-05 DIAGNOSIS — R1310 Dysphagia, unspecified: Secondary | ICD-10-CM

## 2016-08-05 DIAGNOSIS — K529 Noninfective gastroenteritis and colitis, unspecified: Secondary | ICD-10-CM | POA: Diagnosis not present

## 2016-08-05 DIAGNOSIS — K633 Ulcer of intestine: Secondary | ICD-10-CM | POA: Diagnosis not present

## 2016-08-05 MED ORDER — SODIUM CHLORIDE 0.9 % IV SOLN: 500.0000 mL | INTRAVENOUS | Status: DC

## 2016-08-05 NOTE — Op Note (Signed)
[place] Patient Name: Erin Gallagher Procedure Date: 08/05/2016 9:44 AM MRN: [id number] Endoscopist: Mallie Mussel L. Loletha Carrow , MD Age: 45 Referring MD:  Date of Birth: May 07, 1971 Gender: Female Account #: [id number] Procedure:                Colonoscopy Indications:              Rectal bleeding, bloating Medicines:                Monitored Anesthesia Care Procedure:                Pre-Anesthesia Assessment:                           - Prior to the procedure, a History and Physical                            was performed, and patient medications and                            allergies were reviewed. The patient's tolerance of                            previous anesthesia was also reviewed. The risks                            and benefits of the procedure and the sedation                            options and risks were discussed with the patient.                            All questions were answered, and informed consent                            was obtained. Prior Anticoagulants: The patient has                            taken no previous anticoagulant or antiplatelet                            agents. ASA Grade Assessment: II - A patient with                            mild systemic disease. After reviewing the risks                            and benefits, the patient was deemed in                            satisfactory condition to undergo the procedure.                           After obtaining informed consent, the colonoscope  was passed under direct vision. Throughout the                            procedure, the patient's blood pressure, pulse, and                            oxygen saturations were monitored continuously. The                            Model CF-HQ190L [id number]) scope was introduced                            through the anus and advanced to the the terminal                            ileum. The colonoscopy was  performed without                            difficulty. The patient tolerated the procedure                            well. The quality of the bowel preparation was                            excellent. The terminal ileum, ileocecal valve,                            appendiceal orifice, and rectum were photographed.                            The quality of the bowel preparation was evaluated                            using the BBPS [facility] Bowel Preparation Scale)                            with scores of: Right Colon = 3, Transverse Colon =                            3 and Left Colon = 3 (entire mucosa seen well with                            no residual staining, small fragments of stool or                            opaque liquid). The total BBPS score equals 9. The                            bowel preparation used was SUPREP. Scope In: 9:58:35 AM Scope Out: 10:10:07 AM Scope Withdrawal Time: 0 hours 8 minutes 57 seconds  Total Procedure Duration: 0 hours 11 minutes 32 seconds  Findings:  The perianal and digital rectal examinations were                            normal.                           The terminal ileum contained a few four mm ulcers.                            The remainder of the mucosa was normal. Biopsies                            were taken with a cold forceps for histology.                           A six mm ulcer and a 67mm ulcer were found at the                            ileocecal valve. The remainder of the colon mucosa                            was normal.                           The exam was otherwise without abnormality on                            direct and retroflexion views. Complications:            No immediate complications. Estimated Blood Loss:     Estimated blood loss: none. Impression:               - A few ulcers in the terminal ileum. Biopsied.                           - Two ulcers at the ileocecal valve.                            This has the appearance of NSAID-induced ulceration                            more than IBD.                           - The examination was otherwise normal on direct                            and retroflexion views. Recommendation:           - Patient has a contact number available for                            emergencies. The signs and symptoms of potential                            delayed complications were  discussed with the                            patient. Return to normal activities tomorrow.                            Written discharge instructions were provided to the                            patient.                           - Resume previous diet.                           - Continue present medications.                           - Await pathology results.                           - Repeat colonoscopy in 5 years for screening                            purposes due to family history of colon cancer                            (father in his 34s).                           - No aspirin, ibuprofen, naproxen, or other                            non-steroidal anti-inflammatory drugs, as they may                            cause ulcers in the digestive tract such as those                            seen today. Marliss Buttacavoli L. Loletha Carrow, MD 08/05/2016 10:21:38 AM This report has been signed electronically.

## 2016-08-05 NOTE — Progress Notes (Signed)
Called to room to assist during endoscopic procedure.  Patient ID and intended procedure confirmed with present staff. Received instructions for my participation in the procedure from the performing physician.  

## 2016-08-05 NOTE — Progress Notes (Signed)
Report given to PACU RN, vss 

## 2016-08-05 NOTE — Patient Instructions (Signed)
YOU HAD AN ENDOSCOPIC PROCEDURE TODAY AT [facility]:   Refer to the procedure report that was given to you for any specific questions about what was found during the examination.  If the procedure report does not answer your questions, please call your gastroenterologist to clarify.  If you requested that your care partner not be given the details of your procedure findings, then the procedure report has been included in a sealed envelope for you to review at your convenience later.  YOU SHOULD EXPECT: Some feelings of bloating in the abdomen. Passage of more gas than usual.  Walking can help get rid of the air that was put into your GI tract during the procedure and reduce the bloating. If you had a lower endoscopy (such as a colonoscopy or flexible sigmoidoscopy) you may notice spotting of blood in your stool or on the toilet paper. If you underwent a bowel prep for your procedure, you may not have a normal bowel movement for a few days.  Please Note:  You might notice some irritation and congestion in your nose or some drainage.  This is from the oxygen used during your procedure.  There is no need for concern and it should clear up in a day or so.  SYMPTOMS TO REPORT IMMEDIATELY:   Following lower endoscopy (colonoscopy or flexible sigmoidoscopy):  Excessive amounts of blood in the stool  Significant tenderness or worsening of abdominal pains  Swelling of the abdomen that is new, acute  Fever of 100F or higher   Following upper endoscopy (EGD)  Vomiting of blood or coffee ground material  New chest pain or pain under the shoulder blades  Painful or persistently difficult swallowing  New shortness of breath  Fever of 100F or higher  Black, tarry-looking stools  For urgent or emergent issues, a gastroenterologist can be reached at any hour by calling [phone].   DIET:  We do recommend a small meal at first, but then you may proceed to your regular diet.  Drink  plenty of fluids but you should avoid alcoholic beverages for 24 hours.  ACTIVITY:  You should plan to take it easy for the rest of today and you should NOT DRIVE or use heavy machinery until tomorrow (because of the sedation medicines used during the test).    FOLLOW UP: Our staff will call the number listed on your records the next business day following your procedure to check on you and address any questions or concerns that you may have regarding the information given to you following your procedure. If we do not reach you, we will leave a message.  However, if you are feeling well and you are not experiencing any problems, there is no need to return our call.  We will assume that you have returned to your regular daily activities without incident.  If any biopsies were taken you will be contacted by phone or by letter within the next 1-3 weeks.  Please call us at [phone] if you have not heard about the biopsies in 3 weeks.    SIGNATURES/CONFIDENTIALITY: You and/or your care partner have signed paperwork which will be entered into your electronic medical record.  These signatures attest to the fact that that the information above on your After Visit Summary has been reviewed and is understood.  Full responsibility of the confidentiality of this discharge information lies with you and/or your care-partner.  Wait for biopsy results  Jerrye Bushy, anti-reflux (handout given) Repeat colonoscopy  in 5 years  Avoid aspirin, ibuprofen, naproxen, or other non-steriodal anti-inflammatory drugs indefinitely

## 2016-08-05 NOTE — Op Note (Signed)
[place] Patient Name: Erin Gallagher Procedure Date: 08/05/2016 9:44 AM MRN: [id number] Endoscopist: Mallie Mussel L. Loletha Gallagher , MD Age: 45 Referring MD:  Date of Birth: Nov 10, 1970 Gender: Female Account #: [id number] Procedure:                Upper GI endoscopy Indications:              Dysphagia, Heartburn, Abdominal bloating Medicines:                Monitored Anesthesia Care Procedure:                Pre-Anesthesia Assessment:                           - Prior to the procedure, a History and Physical                            was performed, and patient medications and                            allergies were reviewed. The patient's tolerance of                            previous anesthesia was also reviewed. The risks                            and benefits of the procedure and the sedation                            options and risks were discussed with the patient.                            All questions were answered, and informed consent                            was obtained. Prior Anticoagulants: The patient has                            taken no previous anticoagulant or antiplatelet                            agents. ASA Grade Assessment: II - A patient with                            mild systemic disease. After reviewing the risks                            and benefits, the patient was deemed in                            satisfactory condition to undergo the procedure.                           After obtaining informed consent, the endoscope was  passed under direct vision. Throughout the                            procedure, the patient's blood pressure, pulse, and                            oxygen saturations were monitored continuously. The                            Model GIF-[id number] scope was introduced                            through the mouth, and advanced to the second part                            of duodenum.  The upper GI endoscopy was                            accomplished without difficulty. The patient                            tolerated the procedure well. Scope In: Scope Out: Findings:                 The esophagus was normal.                           The stomach was normal.                           The cardia and gastric fundus were normal on                            retroflexion.                           The examined duodenum was normal. Complications:            No immediate complications. Estimated Blood Loss:     Estimated blood loss: none. Impression:               - Normal esophagus.                           - Normal stomach.                           - Normal examined duodenum.                           - No specimens collected.                           Appears to be GERD most likely causing dysmotility. Recommendation:           - Patient has a contact number available for  emergencies. The signs and symptoms of potential                            delayed complications were discussed with the                            patient. Return to normal activities tomorrow.                            Written discharge instructions were provided to the                            patient.                           - Resume previous diet.                           - Continue present medications.                           - See the other procedure note for documentation of                            additional recommendations.                           Anti-reflux diet and lifestyle measures Erin Breuer L. Loletha Carrow, MD 08/05/2016 10:15:07 AM This report has been signed electronically.

## 2016-08-06 ENCOUNTER — Telehealth: Payer: Self-pay | Admitting: *Deleted

## 2016-08-06 ENCOUNTER — Telehealth: Payer: Self-pay

## 2016-08-06 NOTE — Telephone Encounter (Signed)
Message left

## 2016-08-06 NOTE — Telephone Encounter (Signed)
  Follow up Call-  Call back number 08/05/2016  Post procedure Call Back phone  # [phone]  Permission to leave phone message Yes  Some recent data might be hidden    Patient was called for follow up after her procedure on 08/05/2016. No answer at the number given for follow up phone call. A message was left on the answering machine.

## 2016-08-08 ENCOUNTER — Encounter: Payer: Self-pay | Admitting: Gastroenterology

## 2016-09-04 DIAGNOSIS — L300 Nummular dermatitis: Secondary | ICD-10-CM | POA: Diagnosis not present

## 2016-09-04 DIAGNOSIS — L280 Lichen simplex chronicus: Secondary | ICD-10-CM | POA: Diagnosis not present

## 2016-09-04 MED FILL — hydrOXYzine HCL 10 MG TABS: 10 | 30 days supply | Qty: 90 | Fill #0

## 2016-09-04 MED FILL — BETAMETHASONE DP 0.05% CRM: 0.05 | 30 days supply | Qty: 45 | Fill #0

## 2016-10-06 MED FILL — AMLODIPINE BESYLATE 10 MG T: 10 | 90 days supply | Qty: 90 | Fill #3

## 2016-10-06 MED FILL — LOSARTAN POTASSIUM 50 MG TA: 50 | 90 days supply | Qty: 90 | Fill #1

## 2016-11-04 DIAGNOSIS — L2081 Atopic neurodermatitis: Secondary | ICD-10-CM | POA: Diagnosis not present

## 2016-12-02 DIAGNOSIS — H5213 Myopia, bilateral: Secondary | ICD-10-CM | POA: Diagnosis not present

## 2016-12-02 DIAGNOSIS — H52203 Unspecified astigmatism, bilateral: Secondary | ICD-10-CM | POA: Diagnosis not present

## 2016-12-02 DIAGNOSIS — H524 Presbyopia: Secondary | ICD-10-CM | POA: Diagnosis not present

## 2017-01-05 ENCOUNTER — Other Ambulatory Visit: Payer: Self-pay | Admitting: *Deleted

## 2017-01-05 MED ORDER — AMLODIPINE BESYLATE 10 MG PO TABS: 10.0000 mg | ORAL_TABLET | Freq: Every day | ORAL | 3 refills | Status: DC

## 2017-01-05 MED FILL — AMLODIPINE BESYLATE 10 MG T: 10 | 90 days supply | Qty: 90 | Fill #0

## 2017-01-05 MED FILL — LOSARTAN POTASSIUM 50 MG TA: 50 | 90 days supply | Qty: 90 | Fill #2

## 2017-04-08 MED FILL — AMLODIPINE BESYLATE 10 MG T: 10 | 90 days supply | Qty: 90 | Fill #1

## 2017-04-08 MED FILL — LOSARTAN POTASSIUM 50 MG TA: 50 | 90 days supply | Qty: 90 | Fill #3

## 2017-07-13 MED FILL — AMLODIPINE BESYLATE 10 MG T: 10 | 90 days supply | Qty: 90 | Fill #2

## 2017-07-20 MED FILL — LOSARTAN POTASSIUM 50 MG TA: 50 | 90 days supply | Qty: 90 | Fill #0

## 2017-09-18 ENCOUNTER — Telehealth: Payer: Self-pay | Admitting: *Deleted

## 2017-09-18 NOTE — Telephone Encounter (Signed)
Patient left message on nurse line requesting shoulder x-ray. Last seen at [facility] 07/17/2016. Please assist pt in scheduling appt to meet new PCP and discuss shoulder. Hubbard Hartshorn, RN, BSN

## 2017-09-24 ENCOUNTER — Ambulatory Visit: Payer: Self-pay

## 2017-09-24 ENCOUNTER — Other Ambulatory Visit: Payer: Self-pay | Admitting: Occupational Medicine

## 2017-09-24 DIAGNOSIS — M25532 Pain in left wrist: Secondary | ICD-10-CM

## 2017-10-08 ENCOUNTER — Ambulatory Visit: Payer: 59 | Admitting: Family Medicine

## 2017-10-16 MED FILL — LOSARTAN POTASSIUM 50 MG TA: 50 | 90 days supply | Qty: 90 | Fill #1

## 2017-10-16 MED FILL — AMLODIPINE BESYLATE 10 MG T: 10 | 90 days supply | Qty: 90 | Fill #3

## 2017-10-28 ENCOUNTER — Ambulatory Visit: Payer: 59 | Admitting: Family Medicine

## 2017-11-23 ENCOUNTER — Encounter: Payer: Self-pay | Admitting: Physician Assistant

## 2017-11-23 ENCOUNTER — Ambulatory Visit: Payer: 59 | Admitting: Physician Assistant

## 2017-11-23 ENCOUNTER — Other Ambulatory Visit: Payer: Self-pay

## 2017-11-23 VITALS — BP 127/90 | HR 93 | Temp 98.3°F | Resp 18 | Ht 67.13 in | Wt 245.8 lb

## 2017-11-23 DIAGNOSIS — Z7689 Persons encountering health services in other specified circumstances: Secondary | ICD-10-CM | POA: Diagnosis not present

## 2017-11-23 DIAGNOSIS — R55 Syncope and collapse: Secondary | ICD-10-CM | POA: Diagnosis not present

## 2017-11-23 DIAGNOSIS — M1990 Unspecified osteoarthritis, unspecified site: Secondary | ICD-10-CM | POA: Insufficient documentation

## 2017-11-23 NOTE — Progress Notes (Signed)
Erin Gallagher  MRN: [id number] DOB: 1970-12-28  Subjective:  Erin Gallagher is a 47 y.o. female seen in office today for a chief complaint of establish care and dizziness. Has not seen PCP in over 2 years.   Chronic medical condtions: 1) HTN: Dx age 36. On amlodipine 44m and losartan 578mdaily. Does not check bp outside of office. Denies exertional chest pain, SOB, diaphoresis, headache.  2) Arthrits: b/l knee and back 3) GERD controlled with dietary changes.   Acute issues: 1) Intermittent dizziness x 1 month. Will happen 1-2 x per week. Happens when she is bent over applying pressure to patient at work. 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 Only happens when she is stating up in procedure room. Will get blurred vision, clamminess, muffled hearing, and nauseated sensation. Her coworkers say she is pale during these. Denies chest pain, palpitations, abdominal pain, and vomiting.  Once she sits down, it resolves. Has never passed out. Drinks at least 10 glasses a day and eats frequent meals.  No personal history of heart disease, diabetes, or thyroid disorder. Thinks that her father had enlarged heart but is not sure.  No personal history of diabetes or thyroid disease.    LMP 11/03/17. Cycles are regular.   Review of Systems  Constitutional: Negative for chills, diaphoresis and fever.  Respiratory: Negative for shortness of breath.   Cardiovascular: Positive for leg swelling (baseline is some mild swelling in bilateral lower extremities).  Gastrointestinal: Negative for abdominal distention, anal bleeding and diarrhea.  Endocrine: Negative for polydipsia, polyphagia and polyuria.  Neurological: Negative for facial asymmetry, speech difficulty, weakness and numbness.  Psychiatric/Behavioral: Negative for confusion.    Patient Active Problem List   Diagnosis Date Noted  . Right-sided low back pain without sciatica 12/31/2015  . Left knee pain 11/28/2014  . HTN (hypertension)  06/22/2013  . Preventative health care 06/22/2013    Current Outpatient Medications on File Prior to Visit  Medication Sig Dispense Refill  . amLODipine (NORVASC) 10 MG tablet Take 1 tablet (10 mg total) by mouth daily. 90 tablet 3  . losartan (COZAAR) 50 MG tablet TAKE 1 TABLET BY MOUTH ONCE DAILY 90 tablet 3  . cyclobenzaprine (FLEXERIL) 10 MG tablet Take 0.5-1 tablets (5-10 mg total) by mouth 3 (three) times daily as needed for muscle spasms. (Patient not taking: Reported on 11/23/2017) 30 tablet 0  . hydrOXYzine (ATARAX/VISTARIL) 10 MG tablet   2  . pantoprazole (PROTONIX) 40 MG tablet Take 1 tablet (40 mg total) by mouth daily. (Patient not taking: Reported on 11/23/2017) 90 tablet 3  . triamcinolone ointment (KENALOG) 0.1 % Apply 1 application topically 2 (two) times daily. (Patient not taking: Reported on 08/05/2016) 30 g 0   Current Facility-Administered Medications on File Prior to Visit  Medication Dose Route Frequency Provider Last Rate Last Dose  . 0.9 %  sodium chloride infusion  500 mL Intravenous Continuous Danis, HeKirke CorinMD        No Known Allergies    Social History   Socioeconomic History  . Marital status: Married    Spouse name: Not on file  . Number of children: 1  . Years of education: Not on file  . Highest education level: Not on file  Occupational History  . Occupation: SuTherapist, music. Financial resource strain: Not on file  . Food insecurity:    Worry: Not on file    Inability: Not on file  .  Transportation needs:    Medical: Not on file    Non-medical: Not on file  Tobacco Use  . Smoking status: Never Smoker  . Smokeless tobacco: Never Used  Substance and Sexual Activity  . Alcohol use: Yes    Alcohol/week: 1.2 oz    Types: 2 Cans of beer per week    Comment: occasionally  . Drug use: No  . Sexual activity: Yes    Birth control/protection: Other-see comments, Surgical    Comment: Tubal ligation2  Lifestyle  . Physical  activity:    Days per week: Not on file    Minutes per session: Not on file  . Stress: Not on file  Relationships  . Social connections:    Talks on phone: Not on file    Gets together: Not on file    Attends religious service: Not on file    Active member of club or organization: Not on file    Attends meetings of clubs or organizations: Not on file    Relationship status: Not on file  . Intimate partner violence:    Fear of current or ex partner: Not on file    Emotionally abused: Not on file    Physically abused: Not on file    Forced sexual activity: Not on file  Other Topics Concern  . Not on file  Social History Narrative  . Not on file    Objective:  BP 127/90 (BP Location: Right Arm, Patient Position: Sitting, Cuff Size: Large)   Pulse 93   Temp 98.3 F (36.8 C) (Oral)   Resp 18   Ht 5' 7.13" (1.705 m)   Wt 245 lb 12.8 oz (111.5 kg)   LMP 11/03/2017 (Exact Date)   SpO2 100%   BMI 38.35 kg/m   Physical Exam  Constitutional: She is oriented to person, place, and time and well-developed, well-nourished, and in no distress.  HENT:  Head: Normocephalic and atraumatic.  Eyes: Pupils are equal, round, and reactive to light. Conjunctivae and EOM are normal.  Neck: Normal range of motion.  Cardiovascular: Normal rate, regular rhythm and normal heart sounds.  Pulmonary/Chest: Effort normal and breath sounds normal. She has no decreased breath sounds. She has no wheezes. She has no rhonchi. She has no rales.  Musculoskeletal:       Right lower leg: She exhibits edema (1+ to midshin).       Left lower leg: She exhibits edema (1+ to midshin).  Neurological: She is alert and oriented to person, place, and time. She has intact cranial nerves. She has a normal Cerebellar Exam, a normal Finger-Nose-Finger Test, a normal Romberg Test and a normal Tandem Gait Test. Gait normal.  Reflex Scores:      Tricep reflexes are 2+ on the right side and 2+ on the left side.      Bicep  reflexes are 2+ on the right side and 2+ on the left side.      Brachioradialis reflexes are 2+ on the right side and 2+ on the left side.      Patellar reflexes are 2+ on the right side and 2+ on the left side.      Achilles reflexes are 2+ on the right side and 2+ on the left side. Negative Dix Hallpike maneuver.   Skin: Skin is warm and dry.  Psychiatric: Affect normal.  Vitals reviewed.   Orthostatic VS for the past 24 hrs:  BP- Lying Pulse- Lying BP- Sitting Pulse- Sitting BP- Standing at  0 minutes Pulse- Standing at 0 minutes  11/23/17 1526 123/73 84 122/81 85 131/87 91   No results found for this or any previous visit (from the past 24 hour(s)).  EKG shows NSR with rate of 75 bpm. PR and QRS intervals within normal limits. No acute ST or T wave abnormality. No prior EKG for comparison. Findings presented and discussed with Dr. Pamella Pert.   Wt Readings from Last 3 Encounters:  11/23/17 245 lb 12.8 oz (111.5 kg)  08/05/16 250 lb (113.4 kg)  07/30/16 250 lb (113.4 kg)    Assessment and Plan :  This case was precepted with Dr. Pamella Pert.  1. Near syncope Pt is overall well appearing, no distress. Vitals stable. EKG normal. Orthostatic vitals normal. Hx suspicious for vasovagal response. Labs pending. Recommended wearing compression stockings while at work and frequent calf raises while standing still in procedure room. 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 Eat frequent light meals and drink plenty of water. Given strict return/ED precautions.  - CBC with Differential/Platelet - CMP14+EGFR - TSH - EKG 12-Lead - Orthostatic vital signs 2. Vasovagal response 3. Encounter to establish care I am happy to take over pt's care. Recommend she return in 1-2 weeks for CPE.   Tenna Delaine PA-C  [facility] 11/23/2017 3:29 PM

## 2017-11-23 NOTE — Patient Instructions (Addendum)
I recommend starting to wear compression stockings while at work.  Make sure you are doing some calf raises and are moving around every 3-5 minutes while standing in the room.  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  Make sure you continue to eat frequent meals and drink plenty of water.  If you continue to have near syncope event, please check your blood pressure at this time and document this value.    We should have your lab work back within 1 week.  I recommend returning back in 1-2 weeks for complete physical exam.  Make sure you are fasting at this visit.  For blood pressure, I recommend checking your blood pressure at different times throughout the daya few times during the  week and documenting these values.  Please bring these recordings to office at your next visit.   Near-Syncope Near-syncope is when you suddenly get weak or dizzy, or you feel like you might pass out (faint). During an episode of near-syncope, you may:  Feel dizzy or light-headed.  Feel sick to your stomach (nauseous).  See all white or all black.  Have cold, clammy skin.  If you passed out, get help right away.Call your local emergency services (911 in the [place] Do not drive yourself to the hospital. Follow these instructions at home: Pay attention to any changes in your symptoms. Take these actions to help with your condition:  Have someone stay with you until you feel stable.  Do not drive, use machinery, or play sports until your doctor says it is okay.  Keep all follow-up visits as told by your doctor. This is important.  If you start to feel like you might pass out, lie down right away and raise (elevate) your feet above the level of your heart. Breathe deeply and steadily. Wait until all of the symptoms are gone.  Drink enough fluid to keep your pee (urine) clear or pale yellow.  If you are taking blood pressure or heart medicine, get up slowly and spend many minutes  getting ready to sit and then stand. This can help with dizziness.  Take over-the-counter and prescription medicines only as told by your doctor.  Get help right away if:  You have a very bad headache.  You have unusual pain in your chest, tummy, or back.  You are bleeding from your mouth or rectum.  You have black or tarry poop (stool).  You have a very fast or uneven heartbeat (palpitations).  You pass out one time or more than once.  You have jerky movements that you cannot control (seizure).  You are confused.  You have trouble walking.  You are very weak.  You have vision problems. These symptoms may be an emergency. Do not wait to see if the symptoms will go away. Get medical help right away. Call your local emergency services (911 in the [place] Do not drive yourself to the hospital. This information is not intended to replace advice given to you by your health care provider. Make sure you discuss any questions you have with your health care provider. Document Released: 02/04/2008 Document Revised: 01/24/2016 Document Reviewed: 05/02/2015 Elsevier Interactive Patient Education  2017 [employer].   Vasovagal Syncope, Adult Syncope, which is commonly known as fainting or passing out, is a temporary loss of consciousness. It occurs when the blood flow to the brain is reduced. Vasovagal syncope, also called neurocardiogenic syncope, is a fainting spell that happens when blood  flow to the brain is reduced because of a sudden drop in heart rate and blood pressure. Vasovagal syncope is usually harmless. However, you can get injured if you fall during a fainting spell. What are the causes? This condition is caused by a drop in heart rate and blood pressure, usually in response to a trigger. Many things and situations can trigger an episode, including:  Pain.  Fear.  The sight of blood. This may occur during medical procedures, such as when blood is being drawn from a  vein.  Common activities, such as coughing, swallowing, stretching, or going to the bathroom.  Emotional stress.  Being in a confined space.  Prolonged standing, especially in a warm environment.  Lack of sleep or rest.  Not eating for a long time.  Not drinking enough liquids.  Recent illness.  Drinking alcohol.  Taking drugs that affect blood pressure, such as marijuana, cocaine, opiates, or inhalants.  What are the signs or symptoms? Before a fainting episode, you may:  Feel dizzy or light-headed.  Become pale.  Sense that you are going to faint.  Feel like the room is spinning.  Only see directly ahead (tunnel vision).  Feel sick to your stomach (nauseous).  See spots.  Slowly lose vision.  Hear ringing in your ears.  Have a headache.  Feel warm and sweaty.  Feel a sensation of pins and needles.  During the fainting spell, you may twitch or make jerky movements. Fainting spells usually last no longer than a few minutes before you wake up. If you get up too quickly before your body can recover, you may faint again. How is this diagnosed? This condition is diagnosed based on your symptoms, your medical history, and a physical exam. Tests may be done to rule out other causes of fainting. Tests may include:  Blood tests.  Heart tests, such as an electrocardiogram (ECG), echocardiogram, or electrophysiology study.  A test to check your response to changes in position (tilt table test).  How is this treated? Usually, treatment is not needed for this condition. Your health care provider may suggest ways to help prevent fainting episodes. These may include:  Drinking additional fluids if you are exposed to a trigger.  Sitting or lying down if you notice signs that an episode is coming.  If your fainting spells continue, your health care provider may recommend that you:  Take medicines to prevent fainting or to help reduce further episodes of  fainting.  Do certain exercises.  Wear compression stockings.  Have surgery to place a pacemaker in your body (rare).  Follow these instructions at home:  Learn to identify the signs that an episode is coming.  Sit or lie down at the first sign of a fainting spell. If you sit down, put your head down between your legs. If you lie down, swing your legs up in the air to increase blood flow to the brain.  Avoid hot tubs and saunas.  Avoid standing for a long time. If you have to stand for a long time, try: ? Crossing your legs. ? Flexing and stretching your leg muscles. ? Squatting. ? Moving your legs. ? Bending over.  Drink enough fluid to keep your urine clear or pale yellow.  Make changes to your diet that your health care provider recommends. You may be told to: ? Avoid caffeine. ? Eat more salt.  Take over-the-counter and prescription medicines only as told by your health care provider. Contact a health care  provider if:  You continue to have fainting spells despite treatment.  You faint more often despite treatment.  You lose consciousness for more than a few minutes.  You faint during or after exercising or after being startled.  You have twitching or jerky movements for longer than a few seconds during a fainting spell.  You have an episode of twitching or jerky movements without fainting. Get help right away if:  A fainting spell leads to an injury or bleeding.  You have new symptoms that occur with the fainting spells, such as: ? Shortness of breath. ? Chest pain. ? Irregular heartbeat.  You twitch or make jerky movements for more than 5 minutes.  You twitch or make jerky movements during more than one fainting spell. This information is not intended to replace advice given to you by your health care provider. Make sure you discuss any questions you have with your health care provider. Document Released: 08/04/2012 Document Revised: 01/30/2016 Document  Reviewed: 06/16/2015 Elsevier Interactive Patient Education  2018 [employer].    IF you received an x-ray today, you will receive an invoice from [facility]. Please contact [facility] at [phone] with questions or concerns regarding your invoice.   IF you received labwork today, you will receive an invoice from [facility]. Please contact [facility] at [phone] with questions or concerns regarding your invoice.   Our billing staff will not be able to assist you with questions regarding bills from these companies.  You will be contacted with the lab results as soon as they are available. The fastest way to get your results is to activate your My Chart account. Instructions are located on the last page of this paperwork. If you have not heard from [place] regarding the results in 2 weeks, please contact this office.

## 2017-11-24 LAB — CMP14+EGFR
A/G RATIO: 1.3 (ref 1.2–2.2)
ALK PHOS: 61 IU/L (ref 39–117)
ALT: 16 IU/L (ref 0–32)
AST: 18 IU/L (ref 0–40)
Albumin: 3.9 g/dL (ref 3.5–5.5)
BUN/Creatinine Ratio: 11 (ref 9–23)
BUN: 8 mg/dL (ref 6–24)
Bilirubin Total: <0.2 mg/dL (ref 0.0–1.2)
CALCIUM: 8.5 mg/dL — AB (ref 8.7–10.2)
CHLORIDE: 106 mmol/L (ref 96–106)
CO2: 20 mmol/L (ref 20–29)
Creatinine, Ser: 0.73 mg/dL (ref 0.57–1.00)
GFR calc Af Amer: 113 mL/min/{1.73_m2} (ref >59)
GFR, EST NON AFRICAN AMERICAN: 98 mL/min/{1.73_m2} (ref >59)
Globulin, Total: 3.1 g/dL (ref 1.5–4.5)
Glucose: 94 mg/dL (ref 65–99)
POTASSIUM: 4.5 mmol/L (ref 3.5–5.2)
Sodium: 140 mmol/L (ref 134–144)
Total Protein: 7 g/dL (ref 6.0–8.5)

## 2017-11-24 LAB — TSH: TSH: 1.99 u[IU]/mL (ref 0.450–4.500)

## 2017-11-24 LAB — CBC WITH DIFFERENTIAL/PLATELET
Basophils Absolute: 0 10*3/uL (ref 0.0–0.2)
Basos: 0 %
EOS (ABSOLUTE): 0.1 10*3/uL (ref 0.0–0.4)
Eos: 1 %
Hematocrit: 38.9 % (ref 34.0–46.6)
Hemoglobin: 13.4 g/dL (ref 11.1–15.9)
IMMATURE GRANULOCYTES: 0 %
Immature Grans (Abs): 0 10*3/uL (ref 0.0–0.1)
Lymphocytes Absolute: 2 10*3/uL (ref 0.7–3.1)
Lymphs: 25 %
MCH: 27.9 pg (ref 26.6–33.0)
MCHC: 34.4 g/dL (ref 31.5–35.7)
MCV: 81 fL (ref 79–97)
MONOS ABS: 0.5 10*3/uL (ref 0.1–0.9)
Monocytes: 7 %
NEUTROS PCT: 67 %
Neutrophils Absolute: 5.2 10*3/uL (ref 1.4–7.0)
Platelets: 361 10*3/uL (ref 150–379)
RBC: 4.81 x10E6/uL (ref 3.77–5.28)
RDW: 14.7 % (ref 12.3–15.4)
WBC: 7.8 10*3/uL (ref 3.4–10.8)

## 2017-12-03 ENCOUNTER — Encounter: Payer: 59 | Admitting: Physician Assistant

## 2017-12-03 ENCOUNTER — Other Ambulatory Visit: Payer: Self-pay

## 2017-12-03 ENCOUNTER — Ambulatory Visit (INDEPENDENT_AMBULATORY_CARE_PROVIDER_SITE_OTHER): Payer: 59 | Admitting: Physician Assistant

## 2017-12-03 ENCOUNTER — Encounter: Payer: Self-pay | Admitting: Physician Assistant

## 2017-12-03 VITALS — BP 120/78 | HR 86 | Temp 98.3°F | Resp 18 | Ht 66.14 in | Wt 241.4 lb

## 2017-12-03 DIAGNOSIS — E559 Vitamin D deficiency, unspecified: Secondary | ICD-10-CM

## 2017-12-03 DIAGNOSIS — I10 Essential (primary) hypertension: Secondary | ICD-10-CM | POA: Diagnosis not present

## 2017-12-03 DIAGNOSIS — Z124 Encounter for screening for malignant neoplasm of cervix: Secondary | ICD-10-CM

## 2017-12-03 DIAGNOSIS — H52203 Unspecified astigmatism, bilateral: Secondary | ICD-10-CM | POA: Diagnosis not present

## 2017-12-03 DIAGNOSIS — Z23 Encounter for immunization: Secondary | ICD-10-CM | POA: Diagnosis not present

## 2017-12-03 DIAGNOSIS — H524 Presbyopia: Secondary | ICD-10-CM | POA: Diagnosis not present

## 2017-12-03 DIAGNOSIS — Z113 Encounter for screening for infections with a predominantly sexual mode of transmission: Secondary | ICD-10-CM

## 2017-12-03 DIAGNOSIS — Z0000 Encounter for general adult medical examination without abnormal findings: Secondary | ICD-10-CM

## 2017-12-03 DIAGNOSIS — H5213 Myopia, bilateral: Secondary | ICD-10-CM | POA: Diagnosis not present

## 2017-12-03 LAB — POCT URINALYSIS DIP (MANUAL ENTRY)
BILIRUBIN UA: NEGATIVE
Glucose, UA: NEGATIVE mg/dL
Ketones, POC UA: NEGATIVE mg/dL
LEUKOCYTES UA: NEGATIVE
NITRITE UA: NEGATIVE
PH UA: 5.5 (ref 5.0–8.0)
PROTEIN UA: NEGATIVE mg/dL
Spec Grav, UA: 1.025 (ref 1.010–1.025)
Urobilinogen, UA: 0.2 E.U./dL

## 2017-12-03 MED ORDER — AMLODIPINE BESYLATE 10 MG PO TABS: 10.0000 mg | ORAL_TABLET | Freq: Every day | ORAL | 1 refills | Status: DC

## 2017-12-03 MED ORDER — LOSARTAN POTASSIUM 50 MG PO TABS: 50.0000 mg | ORAL_TABLET | Freq: Every day | ORAL | 1 refills | Status: DC

## 2017-12-03 NOTE — Patient Instructions (Addendum)
Health Maintenance, Female Adopting a healthy lifestyle and getting preventive care can go a long way to promote health and wellness. Talk with your health care provider about what schedule of regular examinations is right for you. This is a good chance for you to check in with your provider about disease prevention and staying healthy. In between checkups, there are plenty of things you can do on your own. Experts have done a lot of research about which lifestyle changes and preventive measures are most likely to keep you healthy. Ask your health care provider for more information. Weight and diet Eat a healthy diet  Be sure to include plenty of vegetables, fruits, low-fat dairy products, and lean protein.  Do not eat a lot of foods high in solid fats, added sugars, or salt.  Get regular exercise. This is one of the most important things you can do for your health. ? Most adults should exercise for at least 150 minutes each week. The exercise should increase your heart rate and make you sweat (moderate-intensity exercise). ? Most adults should also do strengthening exercises at least twice a week. This is in addition to the moderate-intensity exercise.  Maintain a healthy weight  Body mass index (BMI) is a measurement that can be used to identify possible weight problems. It estimates body fat based on height and weight. Your health care provider can help determine your BMI and help you achieve or maintain a healthy weight.  For females 20 years of age and older: ? A BMI below 18.5 is considered underweight. ? A BMI of 18.5 to 24.9 is normal. ? A BMI of 25 to 29.9 is considered overweight. ? A BMI of 30 and above is considered obese.  Watch levels of cholesterol and blood lipids  You should start having your blood tested for lipids and cholesterol at 47 years of age, then have this test every 5 years.  You may need to have your cholesterol levels checked more often if: ? Your lipid or  cholesterol levels are high. ? You are older than 47 years of age. ? You are at high risk for heart disease.  Cancer screening Lung Cancer  Lung cancer screening is recommended for adults 55-80 years old who are at high risk for lung cancer because of a history of smoking.  A yearly low-dose CT scan of the lungs is recommended for people who: ? Currently smoke. ? Have quit within the past 15 years. ? Have at least a 30-pack-year history of smoking. A pack year is smoking an average of one pack of cigarettes a day for 1 year.  Yearly screening should continue until it has been 15 years since you quit.  Yearly screening should stop if you develop a health problem that would prevent you from having lung cancer treatment.  Breast Cancer  Practice breast self-awareness. This means understanding how your breasts normally appear and feel.  It also means doing regular breast self-exams. Let your health care provider know about any changes, no matter how small.  If you are in your 20s or 30s, you should have a clinical breast exam (CBE) by a health care provider every 1-3 years as part of a regular health exam.  If you are 40 or older, have a CBE every year. Also consider having a breast X-ray (mammogram) every year.  If you have a family history of breast cancer, talk to your health care provider about genetic screening.  If you are at high risk   for breast cancer, talk to your health care provider about having an MRI and a mammogram every year.  Breast cancer gene (BRCA) assessment is recommended for women who have family members with BRCA-related cancers. BRCA-related cancers include: ? Breast. ? Ovarian. ? Tubal. ? Peritoneal cancers.  Results of the assessment will determine the need for genetic counseling and BRCA1 and BRCA2 testing.  Cervical Cancer Your health care provider may recommend that you be screened regularly for cancer of the pelvic organs (ovaries, uterus, and  vagina). This screening involves a pelvic examination, including checking for microscopic changes to the surface of your cervix (Pap test). You may be encouraged to have this screening done every 3 years, beginning at age 11.  For women ages 33-65, health care providers may recommend pelvic exams and Pap testing every 3 years, or they may recommend the Pap and pelvic exam, combined with testing for human papilloma virus (HPV), every 5 years. Some types of HPV increase your risk of cervical cancer. Testing for HPV may also be done on women of any age with unclear Pap test results.  Other health care providers may not recommend any screening for nonpregnant women who are considered low risk for pelvic cancer and who do not have symptoms. Ask your health care provider if a screening pelvic exam is right for you.  If you have had past treatment for cervical cancer or a condition that could lead to cancer, you need Pap tests and screening for cancer for at least 20 years after your treatment. If Pap tests have been discontinued, your risk factors (such as having a new sexual partner) need to be reassessed to determine if screening should resume. Some women have medical problems that increase the chance of getting cervical cancer. In these cases, your health care provider may recommend more frequent screening and Pap tests.  Colorectal Cancer  This type of cancer can be detected and often prevented.  Routine colorectal cancer screening usually begins at 47 years of age and continues through 47 years of age.  Your health care provider may recommend screening at an earlier age if you have risk factors for colon cancer.  Your health care provider may also recommend using home test kits to check for hidden blood in the stool.  A small camera at the end of a tube can be used to examine your colon directly (sigmoidoscopy or colonoscopy). This is done to check for the earliest forms of colorectal  cancer.  Routine screening usually begins at age 54.  Direct examination of the colon should be repeated every 5-10 years through 47 years of age. However, you may need to be screened more often if early forms of precancerous polyps or small growths are found.  Skin Cancer  Check your skin from head to toe regularly.  Tell your health care provider about any new moles or changes in moles, especially if there is a change in a mole's shape or color.  Also tell your health care provider if you have a mole that is larger than the size of a pencil eraser.  Always use sunscreen. Apply sunscreen liberally and repeatedly throughout the day.  Protect yourself by wearing long sleeves, pants, a wide-brimmed hat, and sunglasses whenever you are outside.  Heart disease, diabetes, and high blood pressure  High blood pressure causes heart disease and increases the risk of stroke. High blood pressure is more likely to develop in: ? People who have blood pressure in the high end of  the normal range (130-139/85-89 mm Hg). ? People who are overweight or obese. ? People who are African American.  If you are 73-30 years of age, have your blood pressure checked every 3-5 years. If you are 33 years of age or older, have your blood pressure checked every year. You should have your blood pressure measured twice-once when you are at a hospital or clinic, and once when you are not at a hospital or clinic. Record the average of the two measurements. To check your blood pressure when you are not at a hospital or clinic, you can use: ? An automated blood pressure machine at a pharmacy. ? A home blood pressure monitor.  If you are between 57 years and 35 years old, ask your health care provider if you should take aspirin to prevent strokes.  Have regular diabetes screenings. This involves taking a blood sample to check your fasting blood sugar level. ? If you are at a normal weight and have a low risk for diabetes,  have this test once every three years after 47 years of age. ? If you are overweight and have a high risk for diabetes, consider being tested at a younger age or more often. Preventing infection Hepatitis B  If you have a higher risk for hepatitis B, you should be screened for this virus. You are considered at high risk for hepatitis B if: ? You were born in a country where hepatitis B is common. Ask your health care provider which countries are considered high risk. ? Your parents were born in a high-risk country, and you have not been immunized against hepatitis B (hepatitis B vaccine). ? You have HIV or AIDS. ? You use needles to inject street drugs. ? You live with someone who has hepatitis B. ? You have had sex with someone who has hepatitis B. ? You get hemodialysis treatment. ? You take certain medicines for conditions, including cancer, organ transplantation, and autoimmune conditions.  Hepatitis C  Blood testing is recommended for: ? Everyone born from [birth year] through 1965. ? Anyone with known risk factors for hepatitis C.  Sexually transmitted infections (STIs)  You should be screened for sexually transmitted infections (STIs) including gonorrhea and chlamydia if: ? You are sexually active and are younger than 47 years of age. ? You are older than 46 years of age and your health care provider tells you that you are at risk for this type of infection. ? Your sexual activity has changed since you were last screened and you are at an increased risk for chlamydia or gonorrhea. Ask your health care provider if you are at risk.  If you do not have HIV, but are at risk, it may be recommended that you take a prescription medicine daily to prevent HIV infection. This is called pre-exposure prophylaxis (PrEP). You are considered at risk if: ? You are sexually active and do not regularly use condoms or know the HIV status of your partner(s). ? You take drugs by injection. ? You are  sexually active with a partner who has HIV.  Talk with your health care provider about whether you are at high risk of being infected with HIV. If you choose to begin PrEP, you should first be tested for HIV. You should then be tested every 3 months for as long as you are taking PrEP. Pregnancy  If you are premenopausal and you may become pregnant, ask your health care provider about preconception counseling.  If you may become  pregnant, take 400 to 800 micrograms (mcg) of folic acid every day.  If you want to prevent pregnancy, talk to your health care provider about birth control (contraception). Osteoporosis and menopause  Osteoporosis is a disease in which the bones lose minerals and strength with aging. This can result in serious bone fractures. Your risk for osteoporosis can be identified using a bone density scan.  If you are 65 years of age or older, or if you are at risk for osteoporosis and fractures, ask your health care provider if you should be screened.  Ask your health care provider whether you should take a calcium or vitamin D supplement to lower your risk for osteoporosis.  Menopause may have certain physical symptoms and risks.  Hormone replacement therapy may reduce some of these symptoms and risks. Talk to your health care provider about whether hormone replacement therapy is right for you. Follow these instructions at home:  Schedule regular health, dental, and eye exams.  Stay current with your immunizations.  Do not use any tobacco products including cigarettes, chewing tobacco, or electronic cigarettes.  If you are pregnant, do not drink alcohol.  If you are breastfeeding, limit how much and how often you drink alcohol.  Limit alcohol intake to no more than 1 drink per day for nonpregnant women. One drink equals 12 ounces of beer, 5 ounces of wine, or 1 ounces of hard liquor.  Do not use street drugs.  Do not share needles.  Ask your health care  provider for help if you need support or information about quitting drugs.  Tell your health care provider if you often feel depressed.  Tell your health care provider if you have ever been abused or do not feel safe at home. This information is not intended to replace advice given to you by your health care provider. Make sure you discuss any questions you have with your health care provider. Document Released: 03/03/2011 Document Revised: 01/24/2016 Document Reviewed: 05/22/2015 Elsevier Interactive Patient Education  2018 [employer]vier Inc.     IF you received an x-ray today, you will receive an invoice from [facility]. Please contact [facility] at [phone] with questions or concerns regarding your invoice.   IF you received labwork today, you will receive an invoice from [facility]. Please contact [facility] at [phone] with questions or concerns regarding your invoice.   Our billing staff will not be able to assist you with questions regarding bills from these companies.  You will be contacted with the lab results as soon as they are available. The fastest way to get your results is to activate your My Chart account. Instructions are located on the last page of this paperwork. If you have not heard from us regarding the results in 2 weeks, please contact this office.      

## 2017-12-03 NOTE — Progress Notes (Signed)
Erin Gallagher  MRN: [id number] DOB: April 16, 1971  Subjective:  Pt is a 47 y.o. female who presents for annual physical exam. Pt is fasting today.   Diet: Mostly eats one meal a day, which is dinner. Will eat a variety of foods. Likes vegetables. Does not consume a lot of dairy. Takes multivitamin.  Exercise: Goes to [employer] 3 x a week. Does 45 minutes. Mix of weights and cardio.  Sleep: Does not sleep well, gets about 5 hours BM: Daily Menstrual: Regular, occur monthly. Last about 4 days. LMP 11/27/17.   Last dental exam: 2018, brushes twice daily Last vision exam: Goes today Last pap smear: Maybe 3 years ago Last mammogram: "Long time ago," they have been normal, no hx of breast cancer Last colonoscopy: 2018, normal.  Vaccinations      Tetanus: Cannot remember        Patient Active Problem List   Diagnosis Date Noted  . Arthritis 11/23/2017  . Right-sided low back pain without sciatica 12/31/2015  . Left knee pain 11/28/2014  . HTN (hypertension) 06/22/2013    Current Outpatient Medications on File Prior to Visit  Medication Sig Dispense Refill  . amLODipine (NORVASC) 10 MG tablet     . losartan (COZAAR) 50 MG tablet      Current Facility-Administered Medications on File Prior to Visit  Medication Dose Route Frequency Provider Last Rate Last Dose  . 0.9 %  sodium chloride infusion  500 mL Intravenous Continuous Danis, Kirke Corin, MD        No Known Allergies  Social History   Socioeconomic History  . Marital status: Married    Spouse name: Not on file  . Number of children: 1  . Years of education: Not on file  . Highest education level: Not on file  Occupational History  . Occupation: Therapist, music  . Financial resource strain: Not on file  . Food insecurity:    Worry: Not on file    Inability: Not on file  . Transportation needs:    Medical: Not on file    Non-medical: Not on file  Tobacco Use  . Smoking status: Never Smoker  .  Smokeless tobacco: Never Used  Substance and Sexual Activity  . Alcohol use: Yes    Alcohol/week: 1.2 oz    Types: 2 Cans of beer per week    Comment: occasionally  . Drug use: No  . Sexual activity: Yes    Birth control/protection: Other-see comments, Surgical    Comment: Tubal ligation2  Lifestyle  . Physical activity:    Days per week: 3 days    Minutes per session: 50 min  . Stress: Not on file  Relationships  . Social connections:    Talks on phone: Not on file    Gets together: Not on file    Attends religious service: Not on file    Active member of club or organization: Not on file    Attends meetings of clubs or organizations: Not on file    Relationship status: Not on file  Other Topics Concern  . Not on file  Social History Narrative  . Not on file    Past Surgical History:  Procedure Laterality Date  . CESAREAN SECTION    . CHOLECYSTECTOMY    . TUBAL LIGATION      Family History  Problem Relation Age of Onset  . Hypertension Mother   . Heart disease Father   . Hypertension Father   .  Colon cancer Father   . Stroke Maternal Grandmother   . Hypertension Maternal Grandmother   . Stomach cancer Neg Hx   . Esophageal cancer Neg Hx   . Rectal cancer Neg Hx   . Liver cancer Neg Hx     Review of Systems  Constitutional: Negative for activity change, appetite change, chills, diaphoresis, fatigue, fever and unexpected weight change.  HENT: Negative for congestion, dental problem, drooling, ear discharge, ear pain, facial swelling, hearing loss, mouth sores, nosebleeds, postnasal drip, rhinorrhea, sinus pressure, sinus pain, sneezing, sore throat, tinnitus, trouble swallowing and voice change.   Eyes: Negative for photophobia, pain, discharge, redness, itching and visual disturbance.  Respiratory: Negative for apnea, cough, choking, chest tightness, shortness of breath, wheezing and stridor.   Cardiovascular: Negative for chest pain, palpitations and leg  swelling.  Gastrointestinal: Negative for abdominal distention, abdominal pain, anal bleeding, blood in stool, constipation, diarrhea, nausea, rectal pain and vomiting.  Endocrine: Negative for cold intolerance, heat intolerance, polydipsia, polyphagia and polyuria.  Genitourinary: Positive for urgency. Negative for decreased urine volume, difficulty urinating, dyspareunia, dysuria, enuresis, flank pain, frequency, genital sores, hematuria, menstrual problem, pelvic pain, vaginal bleeding, vaginal discharge and vaginal pain.  Musculoskeletal: Positive for joint swelling. Negative for arthralgias, back pain, gait problem, myalgias, neck pain and neck stiffness.  Skin: Negative for color change, pallor, rash and wound.  Allergic/Immunologic: Negative for environmental allergies, food allergies and immunocompromised state.  Neurological: Positive for dizziness (intermittent, see last OV notes for further details) and numbness (willl sometimes have tingling in the right arm (dominant arm) x months). Negative for tremors, seizures, syncope, facial asymmetry, speech difficulty, weakness, light-headedness and headaches.  Hematological: Negative for adenopathy. Does not bruise/bleed easily.  Psychiatric/Behavioral: Negative for agitation, behavioral problems, confusion, decreased concentration, dysphoric mood, hallucinations, self-injury, sleep disturbance and suicidal ideas. The patient is not nervous/anxious and is not hyperactive.     Objective:  BP 120/78   Pulse 86   Temp 98.3 F (36.8 C) (Oral)   Resp 18   Ht 5' 6.14" (1.68 m)   Wt 241 lb 6.4 oz (109.5 kg)   LMP 11/27/2017   SpO2 96%   BMI 38.80 kg/m   Physical Exam  Constitutional: She is oriented to person, place, and time and well-developed, well-nourished, and in no distress.  HENT:  Head: Normocephalic and atraumatic.  Right Ear: Hearing, tympanic membrane, external ear and ear canal normal.  Left Ear: Hearing, tympanic membrane,  external ear and ear canal normal.  Nose: Nose normal.  Mouth/Throat: Uvula is midline, oropharynx is clear and moist and mucous membranes are normal. No oropharyngeal exudate.    Eyes: Pupils are equal, round, and reactive to light. Conjunctivae, EOM and lids are normal. No scleral icterus.  Neck: Trachea normal and normal range of motion. No thyroid mass and no thyromegaly present.  Cardiovascular: Normal rate, regular rhythm, normal heart sounds and intact distal pulses.  Pulmonary/Chest: Effort normal and breath sounds normal. Right breast exhibits no inverted nipple, no mass, no nipple discharge, no skin change and no tenderness. Left breast exhibits no inverted nipple, no mass, no nipple discharge, no skin change and no tenderness. Breasts are symmetrical.  Abdominal: Soft. Normal appearance and bowel sounds are normal. There is no tenderness.  Genitourinary: Uterus normal, cervix normal, right adnexa normal, left adnexa normal and vulva normal.  Genitourinary Comments: Small amount of bloody discharge exiting cervical os.   Lymphadenopathy:       Head (right side): No tonsillar, no  preauricular, no posterior auricular and no occipital adenopathy present.       Head (left side): No tonsillar, no preauricular, no posterior auricular and no occipital adenopathy present.    She has no cervical adenopathy.       Right: No supraclavicular adenopathy present.       Left: No supraclavicular adenopathy present.  Neurological: She is alert and oriented to person, place, and time. She has normal sensation, normal strength and normal reflexes. Gait normal.  Skin: Skin is warm and dry.  Psychiatric: Affect normal.    Visual Acuity Screening   Right eye Left eye Both eyes  Without correction: 20/30-1 20/40-2 20/30-1  With correction:      Wt Readings from Last 3 Encounters:  12/03/17 241 lb 6.4 oz (109.5 kg)  11/23/17 245 lb 12.8 oz (111.5 kg)  08/05/16 250 lb (113.4 kg)    Results for  orders placed or performed in visit on 12/03/17 (from the past 24 hour(s))  POCT urinalysis dipstick     Status: Abnormal   Collection Time: 12/03/17  9:58 AM  Result Value Ref Range   Color, UA yellow yellow   Clarity, UA cloudy (A) clear   Glucose, UA negative negative mg/dL   Bilirubin, UA negative negative   Ketones, POC UA negative negative mg/dL   Spec Grav, UA 1.025 1.010 - 1.025   Blood, UA moderate (A) negative   pH, UA 5.5 5.0 - 8.0   Protein Ur, POC negative negative mg/dL   Urobilinogen, UA 0.2 0.2 or 1.0 E.U./dL   Nitrite, UA Negative Negative   Leukocytes, UA Negative Negative     Assessment and Plan :  Discussed healthy lifestyle, diet, exercise, preventative care, vaccinations, and addressed patient's concerns. Plan for follow up in 6 months. Otherwise, plan for specific conditions below. 1. Annual physical exam Await lab results.   2. Essential hypertension Well controlled today. Follow up in 6 months.  - Lipid panel - POCT urinalysis dipstick  3. Screen for STD (sexually transmitted disease) - GC/Chlamydia Probe Amp - Hepatitis panel, acute - HIV antibody - RPR - WET PREP FOR TRICH, YEAST, CLUE  4. Screening for cervical cancer - Pap IG and HPV (high risk) DNA detection  5. Vitamin D deficiency - VITAMIN D 25 Hydroxy (Vit-D Deficiency, Fractures)  Meds ordered this encounter  Medications  . amLODipine (NORVASC) 10 MG tablet    Sig: Take 1 tablet (10 mg total) by mouth daily.    Dispense:  90 tablet    Refill:  1    Order Specific Question:   Supervising Provider    Answer:   Wardell Honour [[id number]]  . losartan (COZAAR) 50 MG tablet    Sig: Take 1 tablet (50 mg total) by mouth daily.    Dispense:  90 tablet    Refill:  1    Order Specific Question:   Supervising Provider    Answer:   Reginia Forts M [[id number]]     Tenna Delaine, PA-C  [facility] 12/03/2017 11:35 AM

## 2017-12-04 LAB — PAP IG AND HPV HIGH-RISK
HPV, high-risk: NEGATIVE
PAP Smear Comment: 0

## 2017-12-04 LAB — GC/CHLAMYDIA PROBE AMP
CHLAMYDIA, DNA PROBE: NEGATIVE
NEISSERIA GONORRHOEAE BY PCR: NEGATIVE

## 2017-12-04 LAB — LIPID PANEL
CHOL/HDL RATIO: 3.1 ratio (ref 0.0–4.4)
Cholesterol, Total: 206 mg/dL — ABNORMAL HIGH (ref 100–199)
HDL: 67 mg/dL (ref >39)
LDL Calculated: 125 mg/dL — ABNORMAL HIGH (ref 0–99)
TRIGLYCERIDES: 70 mg/dL (ref 0–149)
VLDL Cholesterol Cal: 14 mg/dL (ref 5–40)

## 2017-12-04 LAB — HEPATITIS PANEL, ACUTE
HEP A IGM: NEGATIVE
Hep B C IgM: NEGATIVE
Hepatitis B Surface Ag: NEGATIVE

## 2017-12-04 LAB — WET PREP FOR TRICH, YEAST, CLUE
CLUE CELL EXAM: NEGATIVE
TRICHOMONAS EXAM: NEGATIVE
YEAST EXAM: NEGATIVE

## 2017-12-04 LAB — HIV ANTIBODY (ROUTINE TESTING W REFLEX): HIV SCREEN 4TH GENERATION: NONREACTIVE

## 2017-12-04 LAB — VITAMIN D 25 HYDROXY (VIT D DEFICIENCY, FRACTURES): Vit D, 25-Hydroxy: 25.1 ng/mL — ABNORMAL LOW (ref 30.0–100.0)

## 2017-12-04 LAB — RPR: RPR: NONREACTIVE

## 2018-01-14 MED FILL — LOSARTAN POTASSIUM 50 MG TA: 50 | 90 days supply | Qty: 90 | Fill #2

## 2018-01-14 MED FILL — AMLODIPINE BESYLATE 10 MG T: 10 | 90 days supply | Qty: 90 | Fill #0

## 2018-04-20 MED FILL — LOSARTAN POTASSIUM 50 MG TA: 50 | 90 days supply | Qty: 90 | Fill #3

## 2018-04-20 MED FILL — AMLODIPINE BESYLATE 10 MG T: 10 | 90 days supply | Qty: 90 | Fill #1

## 2018-04-28 ENCOUNTER — Other Ambulatory Visit: Payer: Self-pay

## 2018-04-28 ENCOUNTER — Encounter: Payer: Self-pay | Admitting: Family Medicine

## 2018-04-28 ENCOUNTER — Ambulatory Visit (INDEPENDENT_AMBULATORY_CARE_PROVIDER_SITE_OTHER): Payer: 59 | Admitting: Family Medicine

## 2018-04-28 VITALS — BP 122/78 | HR 82 | Temp 98.6°F | Ht 65.83 in | Wt 245.6 lb

## 2018-04-28 DIAGNOSIS — M25552 Pain in left hip: Secondary | ICD-10-CM | POA: Diagnosis not present

## 2018-04-28 DIAGNOSIS — M5432 Sciatica, left side: Secondary | ICD-10-CM | POA: Diagnosis not present

## 2018-04-28 DIAGNOSIS — M67952 Unspecified disorder of synovium and tendon, left thigh: Secondary | ICD-10-CM | POA: Diagnosis not present

## 2018-04-28 MED ORDER — GABAPENTIN 300 MG PO CAPS: 300.0000 mg | ORAL_CAPSULE | Freq: Three times a day (TID) | ORAL | 3 refills | Status: DC

## 2018-04-28 MED FILL — GABAPENTIN 300 MG CAPSULE: 300 | 30 days supply | Qty: 90 | Fill #0

## 2018-04-28 NOTE — Patient Instructions (Signed)
° ° ° °  If you have lab work done today you will be contacted with your lab results within the next 2 weeks.  If you have not heard from us then please contact us. The fastest way to get your results is to register for My Chart. ° ° °IF you received an x-ray today, you will receive an invoice from [facility]. Please contact [facility] at [phone] with questions or concerns regarding your invoice.  ° °IF you received labwork today, you will receive an invoice from [facility]. Please contact [facility] at [phone] with questions or concerns regarding your invoice.  ° °Our billing staff will not be able to assist you with questions regarding bills from these companies. ° °You will be contacted with the lab results as soon as they are available. The fastest way to get your results is to activate your My Chart account. Instructions are located on the last page of this paperwork. If you have not heard from us regarding the results in 2 weeks, please contact this office. °  ° ° ° °

## 2018-04-28 NOTE — Progress Notes (Signed)
8/28/201912:21 PM  LONNA RABOLD [birth year], 47 y.o. female [id number]  Chief Complaint  Patient presents with  . Pain    left hip and right shoulder pain the radiates to the back. Taking aleeve for the pain. Does help with pain, does not want to take daily. Dealing with pain for 3 wks    HPI:   Patient is a 47 y.o. female with past medical history significant for HTN who presents today for LEFT hip pain  3 weeks ago started with a small ache on left hip biofreeze and aleve not helping Was getting worse, last week could barely walk, worse when stepping forward Pain started on side of hip, then radiate towards buttock and down her into her calf, pin and needles Getting cramps at night Sleeps on her opposite side No falls Right shoulder started helping after she had to change the way she sleeps   Fall Risk  04/28/2018 12/03/2017 11/23/2017 07/17/2016 12/05/2014  Falls in the past year? No No No No No     Depression screen [facility] 2/9 04/28/2018 12/03/2017 11/23/2017  Decreased Interest 0 0 0  Down, Depressed, Hopeless 0 0 0  PHQ - 2 Score 0 0 0    No Known Allergies  Prior to Admission medications   Medication Sig Start Date End Date Taking? Authorizing Provider  amLODipine (NORVASC) 10 MG tablet Take 1 tablet (10 mg total) by mouth daily. 12/03/17  Yes Timmothy Euler, [place] D, PA-C  losartan (COZAAR) 50 MG tablet Take 1 tablet (50 mg total) by mouth daily. 12/03/17  Yes Leonie Douglas, PA-C    Past Medical History:  Diagnosis Date  . Gall bladder disease    gall bladder removal  . Hypertension     Past Surgical History:  Procedure Laterality Date  . CESAREAN SECTION    . CHOLECYSTECTOMY    . TUBAL LIGATION      Social History   Tobacco Use  . Smoking status: Never Smoker  . Smokeless tobacco: Never Used  Substance Use Topics  . Alcohol use: Yes    Alcohol/week: 2.0 standard drinks    Types: 2 Cans of beer per week    Comment: occasionally    Family History    Problem Relation Age of Onset  . Hypertension Mother   . Heart disease Father   . Hypertension Father   . Colon cancer Father   . Stroke Maternal Grandmother   . Hypertension Maternal Grandmother   . Stomach cancer Neg Hx   . Esophageal cancer Neg Hx   . Rectal cancer Neg Hx   . Liver cancer Neg Hx     ROS Per hpi  OBJECTIVE:  Blood pressure 122/78, pulse 82, temperature 98.6 F (37 C), temperature source Oral, height 5' 5.83" (1.672 m), weight 245 lb 9.6 oz (111.4 kg), SpO2 98 %. Body mass index is 39.85 kg/m.   Physical Exam  Constitutional: She is oriented to person, place, and time. She appears well-developed and well-nourished.  HENT:  Head: Normocephalic and atraumatic.  Mouth/Throat: Mucous membranes are normal.  Eyes: Pupils are equal, round, and reactive to light. Conjunctivae and EOM are normal. No scleral icterus.  Neck: Neck supple.  Pulmonary/Chest: Effort normal.  Musculoskeletal:       Right hip: Normal.       Left hip: She exhibits tenderness (GT and gluteus medius insertion). She exhibits normal range of motion and no bony tenderness.       Lumbar back: She exhibits  tenderness (left paraspinal) and bony tenderness.  + SLR on the right  Neurological: She is alert and oriented to person, place, and time. She has normal reflexes.  Skin: Skin is warm and dry.  Psychiatric: She has a normal mood and affect.  Nursing note and vitals reviewed.   ASSESSMENT and PLAN  1. Left sided sciatica - Ambulatory referral to Physical Therapy  2. Left hip pain - Ambulatory referral to Physical Therapy  3. Tendinopathy of left gluteus medius - Ambulatory referral to Physical Therapy  Other orders - gabapentin (NEURONTIN) 300 MG capsule; Take 1 capsule (300 mg total) by mouth 3 (three) times daily.  Return in about 4 weeks (around 05/26/2018), or if symptoms worsen or fail to improve.    Rutherford Guys, MD Primary Care at [place],  [place] [address] Ph.  [phone] Fax [phone]

## 2018-04-30 ENCOUNTER — Ambulatory Visit (INDEPENDENT_AMBULATORY_CARE_PROVIDER_SITE_OTHER): Payer: 59

## 2018-04-30 ENCOUNTER — Encounter: Payer: Self-pay | Admitting: Family Medicine

## 2018-04-30 ENCOUNTER — Other Ambulatory Visit: Payer: Self-pay

## 2018-04-30 ENCOUNTER — Ambulatory Visit (INDEPENDENT_AMBULATORY_CARE_PROVIDER_SITE_OTHER): Payer: 59 | Admitting: Family Medicine

## 2018-04-30 VITALS — BP 126/85 | HR 82 | Temp 98.6°F | Ht 65.83 in | Wt 245.4 lb

## 2018-04-30 DIAGNOSIS — M25552 Pain in left hip: Secondary | ICD-10-CM

## 2018-04-30 DIAGNOSIS — M25511 Pain in right shoulder: Secondary | ICD-10-CM

## 2018-04-30 MED ORDER — PREDNISONE 20 MG PO TABS: 40.0000 mg | ORAL_TABLET | Freq: Every day | ORAL | 0 refills | Status: DC

## 2018-04-30 MED ORDER — TRAMADOL HCL 50 MG PO TABS: 50.0000 mg | ORAL_TABLET | Freq: Three times a day (TID) | ORAL | 0 refills | Status: DC | PRN

## 2018-04-30 MED ORDER — DICLOFENAC SODIUM 1 % TD GEL: 2.0000 g | Freq: Four times a day (QID) | TRANSDERMAL | 0 refills | Status: DC

## 2018-04-30 NOTE — Patient Instructions (Signed)
° ° ° °  If you have lab work done today you will be contacted with your lab results within the next 2 weeks.  If you have not heard from us then please contact us. The fastest way to get your results is to register for My Chart. ° ° °IF you received an x-ray today, you will receive an invoice from [facility]. Please contact [facility] at [phone] with questions or concerns regarding your invoice.  ° °IF you received labwork today, you will receive an invoice from [facility]. Please contact [facility] at [phone] with questions or concerns regarding your invoice.  ° °Our billing staff will not be able to assist you with questions regarding bills from these companies. ° °You will be contacted with the lab results as soon as they are available. The fastest way to get your results is to activate your My Chart account. Instructions are located on the last page of this paperwork. If you have not heard from us regarding the results in 2 weeks, please contact this office. °  ° ° ° °

## 2018-04-30 NOTE — Progress Notes (Signed)
8/30/20196:13 PM  Erin Gallagher Oct 08, 1970, 47 y.o. female [id number]  Chief Complaint  Patient presents with  . Follow-up    was not able to sleep last night due to pain    HPI:   Patient is a 47 y.o. female who presents today for unable to sleep due to pain  Seen on 04/28/18 - started gabapentin, so far not helping Left hip and right shoulder are hurting worse, tosses all night long moaning and groaning due to pain Further details see note from 2 days ago   Fall Risk  04/30/2018 04/28/2018 12/03/2017 11/23/2017 07/17/2016  Falls in the past year? No No No No No     Depression screen [facility] 2/9 04/30/2018 04/28/2018 12/03/2017  Decreased Interest 0 0 0  Down, Depressed, Hopeless 0 0 0  PHQ - 2 Score 0 0 0    No Known Allergies  Prior to Admission medications   Medication Sig Start Date End Date Taking? Authorizing Provider  amLODipine (NORVASC) 10 MG tablet Take 1 tablet (10 mg total) by mouth daily. 12/03/17  Yes Timmothy Euler, [place] D, PA-C  gabapentin (NEURONTIN) 300 MG capsule Take 1 capsule (300 mg total) by mouth 3 (three) times daily. 04/28/18  Yes Rutherford Guys, MD  losartan (COZAAR) 50 MG tablet Take 1 tablet (50 mg total) by mouth daily. 12/03/17  Yes Leonie Douglas, PA-C    Past Medical History:  Diagnosis Date  . Gall bladder disease    gall bladder removal  . Hypertension     Past Surgical History:  Procedure Laterality Date  . CESAREAN SECTION    . CHOLECYSTECTOMY    . TUBAL LIGATION      Social History   Tobacco Use  . Smoking status: Never Smoker  . Smokeless tobacco: Never Used  Substance Use Topics  . Alcohol use: Yes    Alcohol/week: 2.0 standard drinks    Types: 2 Cans of beer per week    Comment: occasionally    Family History  Problem Relation Age of Onset  . Hypertension Mother   . Heart disease Father   . Hypertension Father   . Colon cancer Father   . Stroke Maternal Grandmother   . Hypertension Maternal Grandmother   . Stomach  cancer Neg Hx   . Esophageal cancer Neg Hx   . Rectal cancer Neg Hx   . Liver cancer Neg Hx     ROS Per hpi  OBJECTIVE:  Blood pressure 126/85, pulse 82, temperature 98.6 F (37 C), temperature source Oral, height 5' 5.83" (1.672 m), weight 245 lb 6.4 oz (111.3 kg), SpO2 98 %. Body mass index is 39.81 kg/m.   Physical Exam  Constitutional: She appears well-developed and well-nourished.  Musculoskeletal:       Right shoulder: She exhibits decreased range of motion (in all planes) and tenderness (subacromial space). She exhibits no bony tenderness and no swelling.  Neg neers,   Nursing note and vitals reviewed.  Dg Shoulder Right  Result Date: 04/30/2018 CLINICAL DATA:  3 weeks of shoulder pain, no trauma. EXAM: RIGHT SHOULDER - 2+ VIEW COMPARISON:  Chest x-ray 09/06/2011 FINDINGS: There is no acute fracture or subluxation. The RIGHT lung apex is unremarkable. For IMPRESSION: Negative. Electronically Signed   By: Nolon Nations M.D.   On: 04/30/2018 18:43   Dg Hip Unilat W Or W/o Pelvis 2-3 Views Left  Result Date: 04/30/2018 CLINICAL DATA:  Hip pain for 3 weeks.  No trauma. EXAM: DG HIP (  WITH OR WITHOUT PELVIS) 2-3V LEFT COMPARISON:  None. FINDINGS: There is no evidence of hip fracture or dislocation. There is no evidence of arthropathy or other focal bone abnormality. IMPRESSION: Normal appearance of the left hip. Electronically Signed   By: Ulyses Jarred M.D.   On: 04/30/2018 18:43     ASSESSMENT and PLAN  1. Left hip pain Patient seen 2 days ago, referred to PT at that time. Discussed RICE therapy.  Prednisone for acute inflammation for empiric treatment of bursitis. (see note from 2 days ago) diclofenac topically as cant use nsaids with pred. Tramadol for severe pain, bedtime.  - DG HIP UNILAT W OR W/O PELVIS 2-3 VIEWS LEFT; Future  2. Acute pain of right shoulder See #1.  - DG Shoulder Right; Future  Other orders - predniSONE (DELTASONE) 20 MG tablet; Take 2 tablets  (40 mg total) by mouth daily with breakfast. - diclofenac sodium (VOLTAREN) 1 % GEL; Apply 2 g topically 4 (four) times daily. - traMADol (ULTRAM) 50 MG tablet; Take 1 tablet (50 mg total) by mouth every 8 (eight) hours as needed.  Return if symptoms worsen or fail to improve.    Rutherford Guys, MD Primary Care at [place], [place] [address] Ph.  [phone] Fax [phone]

## 2018-05-04 MED FILL — predniSONE 20 MG TABS: 20 | 5 days supply | Qty: 10 | Fill #0

## 2018-05-04 MED FILL — DICLOFENAC SODIUM 1% GEL: 1 | 13 days supply | Qty: 100 | Fill #0

## 2018-05-04 MED FILL — traMADol HCL 50 MG TABS: 50 | 5 days supply | Qty: 15 | Fill #0

## 2018-05-12 ENCOUNTER — Ambulatory Visit: Payer: 59 | Attending: Family Medicine | Admitting: Physical Therapy

## 2018-05-12 ENCOUNTER — Encounter: Payer: Self-pay | Admitting: Physical Therapy

## 2018-05-12 ENCOUNTER — Other Ambulatory Visit: Payer: Self-pay

## 2018-05-12 DIAGNOSIS — R2689 Other abnormalities of gait and mobility: Secondary | ICD-10-CM | POA: Diagnosis not present

## 2018-05-12 DIAGNOSIS — M25611 Stiffness of right shoulder, not elsewhere classified: Secondary | ICD-10-CM | POA: Insufficient documentation

## 2018-05-12 DIAGNOSIS — M25511 Pain in right shoulder: Secondary | ICD-10-CM | POA: Insufficient documentation

## 2018-05-12 DIAGNOSIS — M25652 Stiffness of left hip, not elsewhere classified: Secondary | ICD-10-CM | POA: Diagnosis not present

## 2018-05-12 DIAGNOSIS — G8929 Other chronic pain: Secondary | ICD-10-CM | POA: Insufficient documentation

## 2018-05-12 DIAGNOSIS — M25552 Pain in left hip: Secondary | ICD-10-CM | POA: Insufficient documentation

## 2018-05-13 ENCOUNTER — Encounter: Payer: Self-pay | Admitting: Physical Therapy

## 2018-05-13 NOTE — Therapy (Signed)
[place], [place], [address] Phone: [phone]   Fax:  [phone]  Physical Therapy Evaluation  Patient Details  Name: Erin Gallagher MRN: [id number] Date of Birth: Sep 13, 1970 Referring Provider: Dr Grant Fontana   Encounter Date: 05/12/2018  PT End of Session - 05/13/18 1315    Visit Number  1    Number of Visits  12    Date for PT Re-Evaluation  06/24/18    PT Start Time  1630    PT Stop Time  1723    PT Time Calculation (min)  53 min    Activity Tolerance  Patient tolerated treatment well    Behavior During Therapy  [facility] for tasks assessed/performed       Past Medical History:  Diagnosis Date  . Gall bladder disease    gall bladder removal  . Hypertension     Past Surgical History:  Procedure Laterality Date  . CESAREAN SECTION    . CHOLECYSTECTOMY    . TUBAL LIGATION      There were no vitals filed for this visit.   Subjective Assessment - 05/12/18 1633    Subjective  Patient has had left hip pain for about a month. It came on incidiously. She has increased pain when she lies on it and with walking for long periods of time. She feels like the pain is right around her pocket area. She also has right shoulder pain. Her shoulder has been hurting her for about 6 weeks. She can not remeber doing anything to her shoulder.      Limitations  Lifting;Standing;Walking    How long can you sit comfortably?  no limit     How long can you stand comfortably?  no limit     How long can you walk comfortably?  limited distances before pain increases     Diagnostic tests  Shoulder x-ray: (-)     Patient Stated Goals  to have less pain     Currently in Pain?  Yes    Pain Score  5     Pain Location  Hip    Pain Orientation  Left    Pain Descriptors / Indicators  Dull;Throbbing    Pain Type  Chronic pain    Pain Onset  More than a month ago    Aggravating Factors   standing and walking; sleeping on the  hip    Pain Relieving Factors  biofreeze     Multiple Pain Sites  Yes    Pain Score  6    Pain Descriptors / Indicators  Squeezing;Throbbing    Pain Type  Chronic pain    Pain Onset  More than a month ago    Pain Frequency  Constant    Aggravating Factors   work tasks     Pain Relieving Factors  biofreeze     Effect of Pain on Daily Activities  difficulty using her right arm for work tasks          [facility] PT Assessment - 05/13/18 0001      Assessment   Medical Diagnosis  Right shoulder pain/ Left hip pain     Referring Provider  Dr Grant Fontana    Onset Date/Surgical Date  --   1 month hip 6 weeks shoulder    Hand Dominance  Right    Next MD Visit  will schedule after therapy     Prior Therapy  None  Precautions   Precautions  None      Restrictions   Weight Bearing Restrictions  No      Balance Screen   Has the patient fallen in the past 6 months  No    Has the patient had a decrease in activity level because of a fear of falling?   No    Is the patient reluctant to leave their home because of a fear of falling?   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      Leisure  exercising: walking and bands       Cognition   Overall Cognitive Status  Within Functional Limits for tasks assessed    Attention  Focused    Focused Attention  Appears intact    Memory  Appears intact    Awareness  Appears intact    Problem Solving  Appears intact      Sensation   Light Touch  Appears Intact    Additional Comments  tingling into the right hand at times       Coordination   Gross Motor Movements are Fluid and Coordinated  Yes    Fine Motor Movements are Fluid and Coordinated  Yes      Posture/Postural Control   Posture Comments  forward head; sits in posterior pellvic tilt       AROM   Right Shoulder Flexion  85 Degrees    Right Shoulder ABduction   120 Degrees    Right Shoulder Internal Rotation  --   pain reaching to the scarum    Right Shoulder External Rotation  --   mild pain reaching behind her head      PROM   Right/Left Shoulder  Right;Left    Right Shoulder Flexion  115 Degrees    Right Shoulder ABduction  125 Degrees    Right Shoulder Internal Rotation  70 Degrees    Right Shoulder External Rotation  55 Degrees   pain at end range    Right/Left Hip  Right;Left    Left Hip Flexion  86 with pain    Left Hip Internal Rotation   --   pain with end range IR      Strength   Right/Left Shoulder  Right    Right Shoulder Flexion  4/5    Right Shoulder Internal Rotation  4+/5    Right Shoulder External Rotation  4/5    Right/Left Hip  Left    Left Hip Flexion  3+/5    Left Hip ABduction  3+/5    Left Hip ADduction  4+/5      Palpation   Palpation comment  tenderness top palpation in the bicpes groove and tendenress to palpation in the right shoulder       Special Tests   Other special tests  Hawkins (+) Speeds (+) emprty can (+); Hip scoure test (-) FABER (+)       Ambulation/Gait   Gait Comments  decresased single leg stance time left; significant lateral movement to the left during gait                Objective measurements completed on examination: See above findings.      [facility] Adult PT Treatment/Exercise - 05/13/18 0001      Lumbar Exercises: Stretches   Single Knee to Chest  Stretch Limitations  with towel in pain free range     Lower Trunk Rotation Limitations  x10     Piriformis Stretch Limitations  had some difficulty getting her leg up for stretch;       Shoulder Exercises: Stretch   Other Shoulder Stretches  wand stretch with cuing to stay pain free x10; ER in pain free range x10;              PT Education - 05/12/18 1659    Education Details  reviewed HEP and symptom mangement     Person(s) Educated  Patient    Methods  Explanation;Demonstration;Tactile cues;Verbal cues     Comprehension  Verbalized understanding;Returned demonstration;Verbal cues required;Tactile cues required       PT Short Term Goals - 05/13/18 1506      PT SHORT TERM GOAL #1   Title  Patient will increase left hip passive flexion by 15 degrrees     Time  4    Period  Weeks    Status  New    Target Date  06/10/18      PT SHORT TERM GOAL #2   Title  Patient will increase gross left hip strength to 5/5     Time  4    Period  Weeks    Status  New    Target Date  06/10/18      PT SHORT TERM GOAL #3   Title  Patient will increase activie right shoulder flexion to 4+/5     Time  4    Period  Weeks    Status  New    Target Date  06/10/18      PT SHORT TERM GOAL #4   Title  Patient will increase right shoulder passive flexion by 30 degrees     Time  4    Period  Weeks    Status  New    Target Date  06/10/18        PT Long Term Goals - 05/13/18 1511      PT LONG TERM GOAL #1   Title  Patient will reach overhead with 3lb weight without pain with right arm in order to perfrom work tasks     Time  8    Status  New    Target Date  07/08/18      PT LONG TERM GOAL #2   Title  Patient will lie on her left side at night without pain in order to sleep     Time  8    Period  Weeks    Status  New    Target Date  07/08/18             Plan - 05/13/18 1318    Clinical Impression Statement  Patient is a 47 year old female with left hip pain and right shoulder pain. Signs and symptomns in her hip are consitent with trochanteric bursitis. Her shoulder symptoms are consistent with biceps tendinitis and RTC impingement. She has limited active and passive hip flexion and internal rotation. She has limited shoulder flexion, IR and ER both active and passive. She has significant lateral movement to the left with gait along with decreased left single leg stance time. She would benefit form skilled therapy to first reduce inflammtion in both areas then work on a Magazine features editor.      Clinical Presentation  Evolving    Clinical Decision Making  Low    Rehab Potential  Good  PT Frequency  2x / week    PT Duration  6 weeks    PT Treatment/Interventions  ADLs/Self Care Home Management;Cryotherapy;Electrical Stimulation;Iontophoresis 4mg /ml Dexamethasone;Traction;Ultrasound;Gait training;Therapeutic activities;Therapeutic exercise;Neuromuscular re-education;Patient/family education;Manual techniques;Passive range of motion;Splinting;Taping;Joint Manipulations    PT Next Visit Plan  cosider manula therapy to right hip incuding LAD and IT band roll out; consider ultraspound and ionto to the hi; PROM and mobilizations to the shoulder; review stretches, consider light clamshell for strengthening; consider postural exercises for the shoulder when able.     PT Home Exercise Plan  wand flexion and ER; lateral trunk rotation; piriformis stretch; single knee to chest stretch     Consulted and Agree with Plan of Care  Patient       Patient will benefit from skilled therapeutic intervention in order to improve the following deficits and impairments:  Abnormal gait, Pain, Increased muscle spasms, Impaired UE functional use, Decreased activity tolerance, Decreased range of motion, Decreased strength, Difficulty walking  Visit Diagnosis: Pain in left hip  Stiffness of left hip, not elsewhere classified  Chronic right shoulder pain  Stiffness of right shoulder, not elsewhere classified  Other abnormalities of gait and mobility     Problem List Patient Active Problem List   Diagnosis Date Noted  . Arthritis 11/23/2017  . Right-sided low back pain without sciatica 12/31/2015  . Left knee pain 11/28/2014  . HTN (hypertension) 06/22/2013    Carney Living PT DPT  05/13/2018, 3:30 PM  [facility] [address] [place], [place], [address] Phone: [phone]   Fax:  [phone]  Name: Erin Gallagher MRN: [id number] Date  of Birth: Aug 10, 1971

## 2018-05-25 ENCOUNTER — Ambulatory Visit: Payer: 59 | Admitting: Physical Therapy

## 2018-05-25 DIAGNOSIS — M25611 Stiffness of right shoulder, not elsewhere classified: Secondary | ICD-10-CM

## 2018-05-25 DIAGNOSIS — G8929 Other chronic pain: Secondary | ICD-10-CM | POA: Diagnosis not present

## 2018-05-25 DIAGNOSIS — R2689 Other abnormalities of gait and mobility: Secondary | ICD-10-CM | POA: Diagnosis not present

## 2018-05-25 DIAGNOSIS — M25652 Stiffness of left hip, not elsewhere classified: Secondary | ICD-10-CM

## 2018-05-25 DIAGNOSIS — M25511 Pain in right shoulder: Secondary | ICD-10-CM

## 2018-05-25 DIAGNOSIS — M25552 Pain in left hip: Secondary | ICD-10-CM | POA: Diagnosis not present

## 2018-05-25 NOTE — Patient Instructions (Addendum)
Side Pull: Double Arm   On back, knees bent, feet flat. Arms perpendicular to body, shoulder level, elbows straight but relaxed. Pull arms out to sides, elbows straight. Resistance band comes across collarbones, hands toward floor. Hold momentarily. Slowly return to starting position. Repeat _10-20__ times. Band color __Y___    Shoulder Rotation: Double Arm   On back, knees bent, feet flat, elbows tucked at sides, bent 90, hands palms up. Pull hands apart and down toward floor, keeping elbows near sides. Hold momentarily. Slowly return to starting position. Repeat _10-20__ times. Band color ___Y___   External Rotation: Hip - Knees Apart (Hook-Lying)    Lie with hips and knees bent, band tied just above knees. Draw in abdominals. Pull knees apart. Hold for __5_ seconds.  Repeat _20__ times. Do _2__ times a day.

## 2018-05-25 NOTE — Therapy (Signed)
[place], [place], [address] Phone: [phone]   Fax:  [phone]  Physical Therapy Treatment  Patient Details  Name: Erin Gallagher MRN: [id number] Date of Birth: 1971/06/10 Referring Provider: Dr Grant Fontana   Encounter Date: 05/25/2018  PT End of Session - 05/25/18 1546    Visit Number  2    Number of Visits  16    Date for PT Re-Evaluation  07/08/18    PT Start Time  0345    PT Stop Time  0430    PT Time Calculation (min)  45 min       Past Medical History:  Diagnosis Date  . Gall bladder disease    gall bladder removal  . Hypertension     Past Surgical History:  Procedure Laterality Date  . CESAREAN SECTION    . CHOLECYSTECTOMY    . TUBAL LIGATION      There were no vitals filed for this visit.  Subjective Assessment - 05/25/18 1546    Subjective  About the same. But shoulder is worse right now. 6/10 right shoulder now. Right hip 4/10, able to take a mile walk earlier.     Currently in Pain?  Yes    Pain Score  4     Pain Location  Hip    Pain Orientation  Left    Pain Descriptors / Indicators  Aching    Aggravating Factors   standing, walking     Pain Relieving Factors  biofreeze     Multiple Pain Sites  Yes    Pain Score  6    Pain Location  Shoulder    Pain Descriptors / Indicators  Throbbing    Aggravating Factors   reaching up to hang scopes     Pain Relieving Factors  biofreeze, not using it                        [facility] Adult PT Treatment/Exercise - 05/25/18 0001      Exercises   Exercises  Lumbar      Lumbar Exercises: Stretches   Single Knee to Chest Stretch Limitations  with towel in pain free range 3 x 20 sec     Lower Trunk Rotation Limitations  x10     Piriformis Stretch Limitations  2 x 20 sec       Lumbar Exercises: Supine   Ab Set  5 reps    Pelvic Tilt  10 reps    Glut Set  10 reps    Clam  20 reps    Clam Limitations  red band      Bridge Limitations  cramps in rt leg      Shoulder Exercises: Supine   Horizontal ABduction  10 reps    Theraband Level (Shoulder Horizontal ABduction)  Level 1 (Yellow)    External Rotation  10 reps    Theraband Level (Shoulder External Rotation)  Level 1 (Yellow)      Shoulder Exercises: Stretch   Other Shoulder Stretches  wand stretch with cuing to stay pain free x10; ER in pain free range x10;     Other Shoulder Stretches  PROM Right shoulder Flexion, abduction, ER, IR              PT Education - 05/25/18 1652    Education Details  HEP    Person(s) Educated  Patient    Methods  Explanation;Handout  Comprehension  Verbalized understanding       PT Short Term Goals - 05/13/18 1506      PT SHORT TERM GOAL #1   Title  Patient will increase left hip passive flexion by 15 degrrees     Time  4    Period  Weeks    Status  New    Target Date  06/10/18      PT SHORT TERM GOAL #2   Title  Patient will increase gross left hip strength to 5/5     Time  4    Period  Weeks    Status  New    Target Date  06/10/18      PT SHORT TERM GOAL #3   Title  Patient will increase activie right shoulder flexion to 4+/5     Time  4    Period  Weeks    Status  New    Target Date  06/10/18      PT SHORT TERM GOAL #4   Title  Patient will increase right shoulder passive flexion by 30 degrees     Time  4    Period  Weeks    Status  New    Target Date  06/10/18        PT Long Term Goals - 05/13/18 1511      PT LONG TERM GOAL #1   Title  Patient will reach overhead with 3lb weight without pain with right arm in order to perfrom work tasks     Time  8    Status  New    Target Date  07/08/18      PT LONG TERM GOAL #2   Title  Patient will lie on her left side at night without pain in order to sleep     Time  8    Period  Weeks    Status  New    Target Date  07/08/18            Plan - 05/25/18 1618    Clinical Impression Statement  Pt reports she is about the  same. Reviewed HEP with min cues. Began yellow band supine scapular bands and supine clams. updated HEP.     PT Next Visit Plan  cosider manula therapy to right hip incuding LAD and IT band roll out; consider ultraspound and ionto to the hi; PROM and mobilizations to the shoulder; review stretches, consider light clamshell for strengthening; consider postural exercises for the shoulder when able.     PT Home Exercise Plan  wand flexion and ER; lateral trunk rotation; piriformis stretch; single knee to chest stretch , red band clam, supine yellow band horizontal abduction and ER    Consulted and Agree with Plan of Care  Patient       Patient will benefit from skilled therapeutic intervention in order to improve the following deficits and impairments:  Abnormal gait, Pain, Increased muscle spasms, Impaired UE functional use, Decreased activity tolerance, Decreased range of motion, Decreased strength, Difficulty walking  Visit Diagnosis: Pain in left hip  Stiffness of left hip, not elsewhere classified  Chronic right shoulder pain  Stiffness of right shoulder, not elsewhere classified  Other abnormalities of gait and mobility     Problem List Patient Active Problem List   Diagnosis Date Noted  . Arthritis 11/23/2017  . Right-sided low back pain without sciatica 12/31/2015  . Left knee pain 11/28/2014  . HTN (hypertension) 06/22/2013    Hyacinth Meeker,  Dereck Leep, PTA 05/25/2018, 4:53 PM  [facility] [address] [place], [place], [address] Phone: [phone]   Fax:  [phone]  Name: Erin Gallagher MRN: [id number] Date of Birth: Jun 12, 1971

## 2018-05-31 ENCOUNTER — Encounter: Payer: Self-pay | Admitting: Family Medicine

## 2018-05-31 ENCOUNTER — Other Ambulatory Visit: Payer: Self-pay | Admitting: Family Medicine

## 2018-05-31 MED ORDER — TRAMADOL HCL 50 MG PO TABS: 50.0000 mg | ORAL_TABLET | Freq: Three times a day (TID) | ORAL | 0 refills | Status: DC | PRN

## 2018-05-31 NOTE — Telephone Encounter (Signed)
Last seen 04/30/2018.  Pain meds.   Please advise on refills

## 2018-06-01 ENCOUNTER — Ambulatory Visit: Payer: 59 | Admitting: Physical Therapy

## 2018-06-01 MED FILL — traMADol HCL 50 MG TABS: 50 | 5 days supply | Qty: 15 | Fill #0

## 2018-06-03 ENCOUNTER — Ambulatory Visit: Payer: 59 | Admitting: Emergency Medicine

## 2018-06-08 ENCOUNTER — Ambulatory Visit: Payer: 59 | Admitting: Physical Therapy

## 2018-06-15 ENCOUNTER — Ambulatory Visit: Payer: 59 | Attending: Family Medicine | Admitting: Physical Therapy

## 2018-06-15 DIAGNOSIS — M25652 Stiffness of left hip, not elsewhere classified: Secondary | ICD-10-CM

## 2018-06-15 DIAGNOSIS — G8929 Other chronic pain: Secondary | ICD-10-CM | POA: Diagnosis not present

## 2018-06-15 DIAGNOSIS — R2689 Other abnormalities of gait and mobility: Secondary | ICD-10-CM | POA: Diagnosis not present

## 2018-06-15 DIAGNOSIS — M25511 Pain in right shoulder: Secondary | ICD-10-CM | POA: Insufficient documentation

## 2018-06-15 DIAGNOSIS — M25611 Stiffness of right shoulder, not elsewhere classified: Secondary | ICD-10-CM

## 2018-06-15 DIAGNOSIS — M25552 Pain in left hip: Secondary | ICD-10-CM

## 2018-06-16 ENCOUNTER — Encounter: Payer: Self-pay | Admitting: Physical Therapy

## 2018-06-16 NOTE — Therapy (Addendum)
[place], [place], [address] Phone: [phone]   Fax:  [phone]  Physical Therapy Treatment/ Discharge   Patient Details  Name: Erin Gallagher MRN: [id number] Date of Birth: [birth year] Referring Provider (PT): Dr Grant Fontana   Encounter Date: 06/15/2018  PT End of Session - 06/16/18 0807    Visit Number  3    Number of Visits  16    Date for PT Re-Evaluation  07/08/18    PT Start Time  0349    PT Stop Time  0432    PT Time Calculation (min)  43 min    Activity Tolerance  Patient tolerated treatment well    Behavior During Therapy  [facility] for tasks assessed/performed       Past Medical History:  Diagnosis Date  . Gall bladder disease    gall bladder removal  . Hypertension     Past Surgical History:  Procedure Laterality Date  . CESAREAN SECTION    . CHOLECYSTECTOMY    . TUBAL LIGATION      There were no vitals filed for this visit.  Subjective Assessment - 06/15/18 1611    Subjective  Patient reports her hip is better but her shoulder continues to be bad. She has only been to one treatment visit since her intial eval 1 month ago. She was advised in order to tell if therapy is working she needs to be more consitent.     Limitations  Lifting;Standing;Walking    How long can you sit comfortably?  no limit     How long can you stand comfortably?  no limit     How long can you walk comfortably?  limited distances before pain increases     Diagnostic tests  Shoulder x-ray: (-)     Patient Stated Goals  to have less pain     Pain Score  5     Pain Location  Hip    Pain Orientation  Left    Pain Descriptors / Indicators  Aching    Pain Type  Chronic pain    Pain Onset  More than a month ago    Aggravating Factors   standing , walking     Pain Relieving Factors  biofreeze     Pain Score  6    Pain Location  Shoulder    Pain Descriptors / Indicators  Throbbing    Pain Type  Chronic pain     Pain Onset  More than a month ago    Pain Frequency  Constant    Aggravating Factors   reachign up to hang things     Pain Relieving Factors  bio-freeze     Effect of Pain on Daily Activities  difficulty using her right arm for work                        [facility] Adult PT Treatment/Exercise - 06/16/18 0001      Modalities   Modalities  Ultrasound      Ultrasound   Ultrasound Location  right anterior shoulder     Ultrasound Parameters  1.5 w/cm2 continous     Ultrasound Goals  Pain;Edema      Manual Therapy   Manual Therapy  Joint mobilization;Soft tissue mobilization;Passive ROM    Joint Mobilization  PA and AP glides Grade II and III to shoulder; inferiod glide grade II and III; Posteiror hip mobilization; inferiodr glide both grade  III and IV    Soft tissue mobilization  trigger point release inot the upper trap and to the cervical parapsinals.     Passive ROM  hip flexion; shoulder flexion and ER              PT Education - 06/16/18 0805    Education Details  reviewed the improtance of improving mobility in her hip and knee     Person(s) Educated  Patient    Methods  Explanation    Comprehension  Verbalized understanding;Returned demonstration;Verbal cues required;Tactile cues required       PT Short Term Goals - 06/16/18 0811      PT SHORT TERM GOAL #1   Period  Weeks        PT Long Term Goals - 05/13/18 1511      PT LONG TERM GOAL #1   Title  Patient will reach overhead with 3lb weight without pain with right arm in order to perfrom work tasks     Time  8    Status  New    Target Date  07/08/18      PT LONG TERM GOAL #2   Title  Patient will lie on her left side at night without pain in order to sleep     Time  8    Period  Weeks    Status  New    Target Date  07/08/18            Plan - 06/16/18 0808    Clinical Impression Statement  Therapy focused on manual therapy to her hip and shoulder. She had no increase in pain but  she had pain with hip movement and shoulder range of motion. She has significant spasming in her uppr trap and into her neck. Palpation of the spasm in her neck causes radiating pain into her anteriro shoulder. She has tenderness to alapation in her anterior shoulder and down her bicpes. therapy added ultrasound to decrease this inflammation.     Clinical Presentation  Evolving    Clinical Decision Making  Low    Rehab Potential  Good    PT Frequency  2x / week    PT Duration  6 weeks    PT Treatment/Interventions  ADLs/Self Care Home Management;Cryotherapy;Electrical Stimulation;Iontophoresis 35m/ml Dexamethasone;Traction;Ultrasound;Gait training;Therapeutic activities;Therapeutic exercise;Neuromuscular re-education;Patient/family education;Manual techniques;Passive range of motion;Splinting;Taping;Joint Manipulations    PT Next Visit Plan  cosider manula therapy to right hip incuding LAD and IT band roll out; consider ultraspound and ionto to the hi; PROM and mobilizations to the shoulder; review stretches, consider light clamshell for strengthening; consider postural exercises for the shoulder when able.     PT Home Exercise Plan  wand flexion and ER; lateral trunk rotation; piriformis stretch; single knee to chest stretch , red band clam, supine yellow band horizontal abduction and ER       Patient will benefit from skilled therapeutic intervention in order to improve the following deficits and impairments:  Abnormal gait, Pain, Increased muscle spasms, Impaired UE functional use, Decreased activity tolerance, Decreased range of motion, Decreased strength, Difficulty walking  Visit Diagnosis: Pain in left hip  Stiffness of left hip, not elsewhere classified  Chronic right shoulder pain  Stiffness of right shoulder, not elsewhere classified  Other abnormalities of gait and mobility   PHYSICAL THERAPY DISCHARGE SUMMARY  Visits from Start of Care:3  Current functional level related to  goals / functional outcomes: Unknown patient did not come for follow up  Remaining deficits: Unknown    Education / Equipment: Unknown   Plan: Patient agrees to discharge.  Patient goals were not met. Patient is being discharged due to                                                     ?????       Problem List Patient Active Problem List   Diagnosis Date Noted  . Arthritis 11/23/2017  . Right-sided low back pain without sciatica 12/31/2015  . Left knee pain 11/28/2014  . HTN (hypertension) 06/22/2013    Carney Living PT DPT  06/16/2018, 8:39 AM   Einar Crow SPT  06/16/2018    During this treatment session, the therapist was present, participating in and directing the treatment.   [place], [place], [address] Phone: [phone]   Fax:  [phone]  Name: Erin Gallagher MRN: [id number] Date of Birth: [birth year]

## 2018-06-17 ENCOUNTER — Other Ambulatory Visit: Payer: Self-pay

## 2018-06-17 ENCOUNTER — Ambulatory Visit (INDEPENDENT_AMBULATORY_CARE_PROVIDER_SITE_OTHER): Payer: 59 | Admitting: Family Medicine

## 2018-06-17 ENCOUNTER — Encounter: Payer: Self-pay | Admitting: Family Medicine

## 2018-06-17 VITALS — BP 129/84 | HR 75 | Temp 98.1°F | Ht 66.5 in | Wt 249.0 lb

## 2018-06-17 DIAGNOSIS — M25511 Pain in right shoulder: Secondary | ICD-10-CM | POA: Diagnosis not present

## 2018-06-17 DIAGNOSIS — M5412 Radiculopathy, cervical region: Secondary | ICD-10-CM | POA: Diagnosis not present

## 2018-06-17 MED ORDER — CYCLOBENZAPRINE HCL 5 MG PO TABS: ORAL_TABLET | ORAL | 0 refills | Status: DC

## 2018-06-17 MED ORDER — PREDNISONE 20 MG PO TABS: ORAL_TABLET | ORAL | 0 refills | Status: DC

## 2018-06-17 MED FILL — CYCLOBENZAPRINE 5 MG TABLET: 5 | 5 days supply | Qty: 15 | Fill #0

## 2018-06-17 MED FILL — predniSONE 20 MG TABS: 20 | 9 days supply | Qty: 16 | Fill #0

## 2018-06-17 NOTE — Patient Instructions (Addendum)
Shoulder and arm pain may be due to multiple causes.  I do suspect you have a component of cervical radiculopathy.  You may also have some bursitis of the shoulder.  Start prednisone taper, Flexeril at bedtime if needed, be careful combining with other sedating medications including the tramadol.  Do not take any oral anti-inflammatory medicine while you are on the prednisone.  This includes over-the-counter NSAIDs, but Tylenol is okay.  If not improved in the next 2 to 3 weeks at the most, would recommend recheck for possible neck x-ray or other imaging, and possible evaluation with orthopedics.  Please return sooner if worse.    Shoulder Pain Many things can cause shoulder pain, including:  An injury to the area.  Overuse of the shoulder.  Arthritis.  The source of the pain can be:  Inflammation.  An injury to the shoulder joint.  An injury to a tendon, ligament, or bone.  Follow these instructions at home: Take these actions to help with your pain:  Squeeze a soft ball or a foam pad as much as possible. This helps to keep the shoulder from swelling. It also helps to strengthen the arm.  Take over-the-counter and prescription medicines only as told by your health care provider.  If directed, apply ice to the area: ? Put ice in a plastic bag. ? Place a towel between your skin and the bag. ? Leave the ice on for 20 minutes, 2-3 times per day. Stop applying ice if it does not help with the pain.  If you were given a shoulder sling or immobilizer: ? Wear it as told. ? Remove it to shower or bathe. ? Move your arm as little as possible, but keep your hand moving to prevent swelling.  Contact a health care provider if:  Your pain gets worse.  Your pain is not relieved with medicines.  New pain develops in your arm, hand, or fingers. Get help right away if:  Your arm, hand, or fingers: ? Tingle. ? Become numb. ? Become swollen. ? Become painful. ? Turn white or  blue. This information is not intended to replace advice given to you by your health care provider. Make sure you discuss any questions you have with your health care provider. Document Released: 05/28/2005 Document Revised: 04/13/2016 Document Reviewed: 12/11/2014 Elsevier Interactive Patient Education  2018 [employer].  Cervical Radiculopathy Cervical radiculopathy happens when a nerve in the neck (cervical nerve) is pinched or bruised. This condition can develop because of an injury or as part of the normal aging process. Pressure on the cervical nerves can cause pain or numbness that runs from the neck all the way down into the arm and fingers. Usually, this condition gets better with rest. Treatment may be needed if the condition does not improve. What are the causes? This condition may be caused by:  Injury.  Slipped (herniated) disk.  Muscle tightness in the neck because of overuse.  Arthritis.  Breakdown or degeneration in the bones and joints of the spine (spondylosis) due to aging.  Bone spurs that may develop near the cervical nerves.  What are the signs or symptoms? Symptoms of this condition include:  Pain that runs from the neck to the arm and hand. The pain can be severe or irritating. It may be worse when the neck is moved.  Numbness or weakness in the affected arm and hand.  How is this diagnosed? This condition may be diagnosed based on symptoms, medical history, and  a physical exam. You may also have tests, including:  X-rays.  CT scan.  MRI.  Electromyogram (EMG).  Nerve conduction tests.  How is this treated? In many cases, treatment is not needed for this condition. With rest, the condition usually gets better over time. If treatment is needed, options may include:  Wearing a soft neck collar for short periods of time.  Physical therapy to strengthen your neck muscles.  Medicines, such as NSAIDs, oral corticosteroids, or spinal  injections.  Surgery. This may be needed if other treatments do not help. Various types of surgery may be done depending on the cause of your problems.  Follow these instructions at home: Managing pain  Take over-the-counter and prescription medicines only as told by your health care provider.  If directed, apply ice to the affected area. ? Put ice in a plastic bag. ? Place a towel between your skin and the bag. ? Leave the ice on for 20 minutes, 2-3 times per day.  If ice does not help, you can try using heat. Take a warm shower or warm bath, or use a heat pack as told by your health care provider.  Try a gentle neck and shoulder massage to help relieve symptoms. Activity  Rest as needed. Follow instructions from your health care provider about any restrictions on activities.  Do stretching and strengthening exercises as told by your health care provider or physical therapist. General instructions  If you were given a soft collar, wear it as told by your health care provider.  Use a flat pillow when you sleep.  Keep all follow-up visits as told by your health care provider. This is important. Contact a health care provider if:  Your condition does not improve with treatment. Get help right away if:  Your pain gets much worse and cannot be controlled with medicines.  You have weakness or numbness in your hand, arm, face, or leg.  You have a high fever.  You have a stiff, rigid neck.  You lose control of your bowels or your bladder (have incontinence).  You have trouble with walking, balance, or speaking. This information is not intended to replace advice given to you by your health care provider. Make sure you discuss any questions you have with your health care provider. Document Released: 05/13/2001 Document Revised: 01/24/2016 Document Reviewed: 10/12/2014 Elsevier Interactive Patient Education  [employer].   If you have lab work done today you will be  contacted with your lab results within the next 2 weeks.  If you have not heard from [place] then please contact us. The fastest way to get your results is to register for My Chart.   IF you received an x-ray today, you will receive an invoice from [facility]. Please contact [facility] at [phone] with questions or concerns regarding your invoice.   IF you received labwork today, you will receive an invoice from [place]. Please contact [facility] at [phone] with questions or concerns regarding your invoice.   Our billing staff will not be able to assist you with questions regarding bills from these companies.  You will be contacted with the lab results as soon as they are available. The fastest way to get your results is to activate your My Chart account. Instructions are located on the last page of this paperwork. If you have not heard from [place] regarding the results in 2 weeks, please contact this office.

## 2018-06-17 NOTE — Progress Notes (Signed)
Subjective:    Patient ID: Erin Gallagher, female    DOB: Jul 20, 1971, 47 y.o.   MRN: [id number]  HPI Erin Gallagher is a 47 y.o. female Presents today for: Chief Complaint  Patient presents with  . shoulder and neck pain    rt side. going on for months and constant   Presents with R sided neck to shoulder pain. Pain present for months. NKI.  Seen 8/28 by Dr. Pamella Pert.  shoulder soreness was there, but had improved with sleeping on back - not side. When seen 8/30, worse shoulder pain. XR shoulder negative. Treated 8/30 with prednisone 40mg  qd for 5 days. No apparent improvement. voltaren gel didn;t help - stopped using. Tramadol at night to help rest - helps to sleep. In physical therapy once per week, s/p 3 visits. Told by therapist may be neck related.  Pain continues to go form top of shoulder, down back of arm and down toward hand, tingling at times. Able to grip, R hand dominant.     Review of Systems  Constitutional: Negative for diaphoresis, fever and unexpected weight change.  Musculoskeletal: Positive for arthralgias and myalgias.  Skin: Negative for rash.       Objective:   Physical Exam  Constitutional: She is oriented to person, place, and time. She appears well-developed and well-nourished. No distress.  HENT:  Head: Normocephalic and atraumatic.  Cardiovascular: Normal rate.  Pulmonary/Chest: Effort normal.  Musculoskeletal:       Right shoulder: She exhibits tenderness (Anterior shoulder, lateral deltoid area.). She exhibits normal range of motion, no bony tenderness (No SCT/AC tenderness), no swelling, no spasm and normal strength (Full RTC strength, slight guarding with Neer and Hawkins but full range of motion).       Cervical back: She exhibits decreased range of motion (Decreased extension, rotation bilaterally with some upper shoulder, trapezius discomfort with right rotation.) and spasm (Right paraspinals into upper trapezius). She exhibits no bony tenderness  and no swelling.  Neurological: She is alert and oriented to person, place, and time.  Reflex Scores:      Tricep reflexes are 2+ on the right side and 2+ on the left side.      Bicep reflexes are 2+ on the right side and 2+ on the left side.      Brachioradialis reflexes are 2+ on the right side and 2+ on the left side. Psychiatric: She has a normal mood and affect.   Vitals:   06/17/18 0819  BP: 129/84  Pulse: 75  Temp: 98.1 F (36.7 C)  TempSrc: Oral  SpO2: 98%  Weight: 249 lb (112.9 kg)  Height: 5' 6.5" (1.689 m)      Assessment & Plan:    Erin Gallagher is a 47 y.o. female Pain in joint of right shoulder - Plan: predniSONE (DELTASONE) 20 MG tablet, cyclobenzaprine (FLEXERIL) 5 MG tablet  Cervical radiculopathy - Plan: predniSONE (DELTASONE) 20 MG tablet, cyclobenzaprine (FLEXERIL) 5 MG tablet  Possible multiple contributors including likely cervical radiculopathy given arm symptoms, with possible underlying cervical DDD.  Denies injury, imaging deferred at this time.  May also have some component of subacromial bursitis/impingement.   -Continue physical therapy  -Prednisone taper, longer course than initial.  Potential side effects, risks discussed  -Flexeril 5 mg at bedtime as needed, side effects discussed.  Caution with other sedating meds  -Recheck next 3 weeks if not improving, sooner if worse.  Consider imaging versus Ortho evaluation.  Meds ordered this encounter  Medications  . predniSONE (DELTASONE) 20 MG tablet    Sig: 3 by mouth for 3 days, then 2 by mouth for 2 days, then 1 by mouth for 2 days, then 1/2 by mouth for 2 days.    Dispense:  16 tablet    Refill:  0  . cyclobenzaprine (FLEXERIL) 5 MG tablet    Sig: 1 pill by mouth up to every 8 hours as needed. Start with one pill by mouth each bedtime as needed due to sedation    Dispense:  15 tablet    Refill:  0   Patient Instructions     Shoulder and arm pain may be due to multiple causes.  I do  suspect you have a component of cervical radiculopathy.  You may also have some bursitis of the shoulder.  Start prednisone taper, Flexeril at bedtime if needed, be careful combining with other sedating medications including the tramadol.  Do not take any oral anti-inflammatory medicine while you are on the prednisone.  This includes over-the-counter NSAIDs, but Tylenol is okay.  If not improved in the next 2 to 3 weeks at the most, would recommend recheck for possible neck x-ray or other imaging, and possible evaluation with orthopedics.  Please return sooner if worse.    Shoulder Pain Many things can cause shoulder pain, including:  An injury to the area.  Overuse of the shoulder.  Arthritis.  The source of the pain can be:  Inflammation.  An injury to the shoulder joint.  An injury to a tendon, ligament, or bone.  Follow these instructions at home: Take these actions to help with your pain:  Squeeze a soft ball or a foam pad as much as possible. This helps to keep the shoulder from swelling. It also helps to strengthen the arm.  Take over-the-counter and prescription medicines only as told by your health care provider.  If directed, apply ice to the area: ? Put ice in a plastic bag. ? Place a towel between your skin and the bag. ? Leave the ice on for 20 minutes, 2-3 times per day. Stop applying ice if it does not help with the pain.  If you were given a shoulder sling or immobilizer: ? Wear it as told. ? Remove it to shower or bathe. ? Move your arm as little as possible, but keep your hand moving to prevent swelling.  Contact a health care provider if:  Your pain gets worse.  Your pain is not relieved with medicines.  New pain develops in your arm, hand, or fingers. Get help right away if:  Your arm, hand, or fingers: ? Tingle. ? Become numb. ? Become swollen. ? Become painful. ? Turn white or blue. This information is not intended to replace advice given  to you by your health care provider. Make sure you discuss any questions you have with your health care provider. Document Released: 05/28/2005 Document Revised: 04/13/2016 Document Reviewed: 12/11/2014 Elsevier Interactive Patient Education  2018 [employer].  Cervical Radiculopathy Cervical radiculopathy happens when a nerve in the neck (cervical nerve) is pinched or bruised. This condition can develop because of an injury or as part of the normal aging process. Pressure on the cervical nerves can cause pain or numbness that runs from the neck all the way down into the arm and fingers. Usually, this condition gets better with rest. Treatment may be needed if the condition does not improve. What are the causes? This condition may be caused by:  Injury.  Slipped (herniated) disk.  Muscle tightness in the neck because of overuse.  Arthritis.  Breakdown or degeneration in the bones and joints of the spine (spondylosis) due to aging.  Bone spurs that may develop near the cervical nerves.  What are the signs or symptoms? Symptoms of this condition include:  Pain that runs from the neck to the arm and hand. The pain can be severe or irritating. It may be worse when the neck is moved.  Numbness or weakness in the affected arm and hand.  How is this diagnosed? This condition may be diagnosed based on symptoms, medical history, and a physical exam. You may also have tests, including:  X-rays.  CT scan.  MRI.  Electromyogram (EMG).  Nerve conduction tests.  How is this treated? In many cases, treatment is not needed for this condition. With rest, the condition usually gets better over time. If treatment is needed, options may include:  Wearing a soft neck collar for short periods of time.  Physical therapy to strengthen your neck muscles.  Medicines, such as NSAIDs, oral corticosteroids, or spinal injections.  Surgery. This may be needed if other treatments do not help.  Various types of surgery may be done depending on the cause of your problems.  Follow these instructions at home: Managing pain  Take over-the-counter and prescription medicines only as told by your health care provider.  If directed, apply ice to the affected area. ? Put ice in a plastic bag. ? Place a towel between your skin and the bag. ? Leave the ice on for 20 minutes, 2-3 times per day.  If ice does not help, you can try using heat. Take a warm shower or warm bath, or use a heat pack as told by your health care provider.  Try a gentle neck and shoulder massage to help relieve symptoms. Activity  Rest as needed. Follow instructions from your health care provider about any restrictions on activities.  Do stretching and strengthening exercises as told by your health care provider or physical therapist. General instructions  If you were given a soft collar, wear it as told by your health care provider.  Use a flat pillow when you sleep.  Keep all follow-up visits as told by your health care provider. This is important. Contact a health care provider if:  Your condition does not improve with treatment. Get help right away if:  Your pain gets much worse and cannot be controlled with medicines.  You have weakness or numbness in your hand, arm, face, or leg.  You have a high fever.  You have a stiff, rigid neck.  You lose control of your bowels or your bladder (have incontinence).  You have trouble with walking, balance, or speaking. This information is not intended to replace advice given to you by your health care provider. Make sure you discuss any questions you have with your health care provider. Document Released: 05/13/2001 Document Revised: 01/24/2016 Document Reviewed: 10/12/2014 Elsevier Interactive Patient Education  [employer].   If you have lab work done today you will be contacted with your lab results within the next 2 weeks.  If you have not heard  from [place] then please contact us. The fastest way to get your results is to register for My Chart.   IF you received an x-ray today, you will receive an invoice from [facility]. Please contact [facility] at [phone] with questions or concerns regarding your invoice.   IF you  received labwork today, you will receive an invoice from [facility]. Please contact [facility] at [phone] with questions or concerns regarding your invoice.   Our billing staff will not be able to assist you with questions regarding bills from these companies.  You will be contacted with the lab results as soon as they are available. The fastest way to get your results is to activate your My Chart account. Instructions are located on the last page of this paperwork. If you have not heard from [place] regarding the results in 2 weeks, please contact this office.       Signed,   Merri Ray, MD [facility].  06/17/18 8:59 AM

## 2018-07-14 NOTE — Therapy (Signed)
Addendum created in error  07/14/2018   Carolyne Littles PT DPT  07/14/2018 10:42

## 2018-07-19 ENCOUNTER — Other Ambulatory Visit: Payer: Self-pay | Admitting: Physician Assistant

## 2018-07-19 MED FILL — LOSARTAN POTASSIUM 50 MG TA: 50 | 90 days supply | Qty: 90 | Fill #4

## 2018-07-20 MED FILL — AMLODIPINE BESYLATE 10 MG T: 10 | 90 days supply | Qty: 90 | Fill #0

## 2018-07-20 NOTE — Telephone Encounter (Signed)
Requested Prescriptions  Pending Prescriptions Disp Refills  . amLODipine (NORVASC) 10 MG tablet [Pharmacy Med Name: AMLODIPINE BESYLATE 10 MG T 10 TAB] 90 tablet 1    Sig: TAKE 1 TABLET BY MOUTH DAILY.     Cardiovascular:  Calcium Channel Blockers Passed - 07/19/2018  8:06 AM      Passed - Last BP in normal range    BP Readings from Last 1 Encounters:  06/17/18 129/84         Passed - Valid encounter within last 6 months    Recent Outpatient Visits          1 month ago Pain in joint of right shoulder   [facility], Ranell Patrick, MD   2 months ago Left hip pain   [facility], Lilia Argue, MD   2 months ago Left sided sciatica   [facility], Lilia Argue, MD   7 months ago Annual physical exam   [facility] [place] [facility], [place] D, PA-C   7 months ago Near syncope   [facility], [place] D, [place]

## 2018-10-21 MED FILL — GABAPENTIN 300 MG CAPSULE: 300 | 30 days supply | Qty: 90 | Fill #1

## 2018-10-21 MED FILL — AMLODIPINE BESYLATE 10 MG T: 10 | 90 days supply | Qty: 90 | Fill #1

## 2018-10-21 MED FILL — LOSARTAN POTASSIUM 50 MG TA: 50 | 90 days supply | Qty: 90 | Fill #0

## 2019-01-14 ENCOUNTER — Other Ambulatory Visit: Payer: Self-pay | Admitting: Physician Assistant

## 2019-01-14 MED FILL — LOSARTAN POTASSIUM 50 MG TA: 50 | 30 days supply | Qty: 30 | Fill #0

## 2019-02-04 ENCOUNTER — Telehealth: Payer: Self-pay | Admitting: Family Medicine

## 2019-02-04 NOTE — Telephone Encounter (Signed)
Pt had appt scheduled for 0/15/2020 . We have already given her a 30 day supply   But pt will need more before her next appt that had to be reschedule due to provider not being available fr

## 2019-02-08 ENCOUNTER — Other Ambulatory Visit: Payer: Self-pay | Admitting: Physician Assistant

## 2019-02-08 MED FILL — AMLODIPINE BESYLATE 10 MG T: 10 | 90 days supply | Qty: 90 | Fill #0

## 2019-02-08 NOTE — Telephone Encounter (Signed)
Last filled by Timmothy Euler, would Dr. Pamella Pert like to take over rx?

## 2019-02-14 ENCOUNTER — Encounter: Payer: 59 | Admitting: Family Medicine

## 2019-02-22 MED FILL — AMLODIPINE BESYLATE 10 MG T: 10 | 90 days supply | Qty: 90 | Fill #0

## 2019-03-08 ENCOUNTER — Ambulatory Visit (INDEPENDENT_AMBULATORY_CARE_PROVIDER_SITE_OTHER): Payer: 59 | Admitting: Family Medicine

## 2019-03-08 ENCOUNTER — Encounter: Payer: Self-pay | Admitting: Family Medicine

## 2019-03-08 ENCOUNTER — Other Ambulatory Visit: Payer: Self-pay

## 2019-03-08 VITALS — BP 142/99 | HR 76 | Temp 98.0°F | Ht 66.5 in | Wt 251.0 lb

## 2019-03-08 DIAGNOSIS — Z0000 Encounter for general adult medical examination without abnormal findings: Secondary | ICD-10-CM

## 2019-03-08 DIAGNOSIS — N938 Other specified abnormal uterine and vaginal bleeding: Secondary | ICD-10-CM | POA: Diagnosis not present

## 2019-03-08 DIAGNOSIS — Z0001 Encounter for general adult medical examination with abnormal findings: Secondary | ICD-10-CM | POA: Diagnosis not present

## 2019-03-08 DIAGNOSIS — Z1231 Encounter for screening mammogram for malignant neoplasm of breast: Secondary | ICD-10-CM | POA: Diagnosis not present

## 2019-03-08 DIAGNOSIS — I10 Essential (primary) hypertension: Secondary | ICD-10-CM

## 2019-03-08 MED ORDER — LOSARTAN POTASSIUM 50 MG PO TABS: 50.0000 mg | ORAL_TABLET | Freq: Every day | ORAL | 1 refills | Status: DC

## 2019-03-08 MED ORDER — AMLODIPINE BESYLATE 10 MG PO TABS: 10.0000 mg | ORAL_TABLET | Freq: Every day | ORAL | 1 refills | Status: DC

## 2019-03-08 MED FILL — LOSARTAN POTASSIUM 50 MG TA: 50 | 30 days supply | Qty: 30 | Fill #0

## 2019-03-08 NOTE — Progress Notes (Signed)
7/7/202011:20 AM  Erin Gallagher May 21, 1971, 48 y.o., female [id number]  Chief Complaint  Patient presents with  . Annual Exam    not taking bo mes in 1 wk, trying to wean. Taking bp meds since 48 yo. Eating healthy, and exercising    HPI:   Patient is a 48 y.o. female with past medical history significant for HTN who presents today for CPE  G&Ps: 3011 Pap: April 2019, neg pap and hpv, denies h/o abnormal STD: April 2019 BC : BTL Menses: usually monthly, but recently having about twice a month with heavy with clots, last 3-5 days, not painful, starting to have some hotflashes Mammogram: ordered today FHX breast/ovarian cancer: denies FHx colon cancer: father in his 72s Colonoscopy: 2017 Exercise/diet: has been working on diet and exercise, walking about 3 miles a day and doing some weights, eating more greens, has added a salad to each meal, eating mostly grilled meats, needs to work on water  Most Recent Immunizations  Administered Date(s) Administered  . Influenza,inj,Quad PF,6+ Mos 06/14/2014  . Influenza-Unspecified 06/01/2015  . Td 12/03/2017    Depression screen PHQ 2/9 03/08/2019 06/17/2018 04/30/2018  Decreased Interest 0 0 0  Down, Depressed, Hopeless 0 0 0  PHQ - 2 Score 0 0 0    Fall Risk  03/08/2019 06/17/2018 04/30/2018 04/28/2018 12/03/2017  Falls in the past year? 0 No No No No  Number falls in past yr: 0 - - - -  Injury with Fall? 0 - - - -     No Known Allergies  Prior to Admission medications   Medication Sig Start Date End Date Taking? Authorizing Provider  cyclobenzaprine (FLEXERIL) 5 MG tablet 1 pill by mouth up to every 8 hours as needed. Start with one pill by mouth each bedtime as needed due to sedation 06/17/18  Yes Wendie Agreste, MD  gabapentin (NEURONTIN) 300 MG capsule Take 1 capsule (300 mg total) by mouth 3 (three) times daily. 04/28/18  Yes Rutherford Guys, MD  losartan (COZAAR) 50 MG tablet TAKE 1 TABLET BY MOUTH DAILY. 01/14/19  Yes  Wendie Agreste, MD  predniSONE (DELTASONE) 20 MG tablet 3 by mouth for 3 days, then 2 by mouth for 2 days, then 1 by mouth for 2 days, then 1/2 by mouth for 2 days. 06/17/18  Yes Wendie Agreste, MD  traMADol (ULTRAM) 50 MG tablet Take 1 tablet (50 mg total) by mouth every 8 (eight) hours as needed. 05/31/18  Yes Rutherford Guys, MD    Past Medical History:  Diagnosis Date  . Gall bladder disease    gall bladder removal  . Hypertension     Past Surgical History:  Procedure Laterality Date  . CESAREAN SECTION    . CHOLECYSTECTOMY    . TUBAL LIGATION      Social History   Tobacco Use  . Smoking status: Never Smoker  . Smokeless tobacco: Never Used  Substance Use Topics  . Alcohol use: Yes    Alcohol/week: 2.0 standard drinks    Types: 2 Cans of beer per week    Comment: occasionally    Family History  Problem Relation Age of Onset  . Hypertension Mother   . Heart disease Father   . Hypertension Father   . Colon cancer Father   . Stroke Maternal Grandmother   . Hypertension Maternal Grandmother   . Stomach cancer Neg Hx   . Esophageal cancer Neg Hx   . Rectal cancer Neg  Hx   . Liver cancer Neg Hx     Review of Systems  Constitutional: Negative for chills and fever.  Respiratory: Negative for cough and shortness of breath.   Cardiovascular: Negative for chest pain, palpitations and leg swelling.  Gastrointestinal: Negative for abdominal pain, nausea and vomiting.  All other systems reviewed and are negative.  Per hpi  OBJECTIVE:  Today's Vitals   03/08/19 1048  BP: (!) 142/99  Pulse: 76  Temp: 98 F (36.7 C)  TempSrc: Oral  SpO2: 96%  Weight: 251 lb (113.9 kg)  Height: 5' 6.5" (1.689 m)   Body mass index is 39.91 kg/m.  Wt Readings from Last 3 Encounters:  03/08/19 251 lb (113.9 kg)  06/17/18 249 lb (112.9 kg)  04/30/18 245 lb 6.4 oz (111.3 kg)   BP Readings from Last 3 Encounters:  03/08/19 (!) 142/99  06/17/18 129/84  04/30/18 126/85     Physical Exam Vitals signs and nursing note reviewed.  Constitutional:      Appearance: She is well-developed.  HENT:     Head: Normocephalic and atraumatic.     Right Ear: Hearing, tympanic membrane, ear canal and external ear normal.     Left Ear: Hearing, tympanic membrane, ear canal and external ear normal.     Mouth/Throat:     Mouth: Mucous membranes are moist.     Pharynx: No oropharyngeal exudate or posterior oropharyngeal erythema.  Eyes:     Extraocular Movements: Extraocular movements intact.     Conjunctiva/sclera: Conjunctivae normal.     Pupils: Pupils are equal, round, and reactive to light.  Neck:     Musculoskeletal: Neck supple.     Thyroid: No thyromegaly.  Cardiovascular:     Rate and Rhythm: Normal rate and regular rhythm.     Heart sounds: Normal heart sounds. No murmur. No friction rub. No gallop.   Pulmonary:     Effort: Pulmonary effort is normal.     Breath sounds: Normal breath sounds. No wheezing, rhonchi or rales.  Abdominal:     General: Bowel sounds are normal. There is no distension.     Palpations: Abdomen is soft. There is no hepatomegaly, splenomegaly or mass.     Tenderness: There is no abdominal tenderness.  Musculoskeletal: Normal range of motion.     Right lower leg: No edema.     Left lower leg: No edema.  Lymphadenopathy:     Cervical: No cervical adenopathy.  Skin:    General: Skin is warm and dry.  Neurological:     Mental Status: She is alert and oriented to person, place, and time.     Cranial Nerves: No cranial nerve deficit.     Gait: Gait normal.     Deep Tendon Reflexes: Reflexes are normal and symmetric.  Psychiatric:        Mood and Affect: Mood normal.        Behavior: Behavior normal.     ASSESSMENT and PLAN  1. Annual physical exam Routine HCM labs ordered. HCM reviewed/discussed. Anticipatory guidance regarding healthy weight, lifestyle and choices given.   2. Essential hypertension Restart medications,  previously controlled on these.  - Lipid panel - TSH - CMP14+EGFR - CBC  3. DUB (dysfunctional uterine bleeding) - Ambulatory referral to Obstetrics / Gynecology  4. Visit for screening mammogram - MM DIGITAL SCREENING BILATERAL; Future  Other orders - losartan (COZAAR) 50 MG tablet; Take 1 tablet (50 mg total) by mouth daily. - amLODipine (NORVASC) 10 MG  tablet; Take 1 tablet (10 mg total) by mouth daily.  Return in about 6 months (around 09/08/2019).    Rutherford Guys, MD [facility] [place], [place] [address] Ph.  [phone] Fax [phone]

## 2019-03-08 NOTE — Patient Instructions (Addendum)
   If you have lab work done today you will be contacted with your lab results within the next 2 weeks.  If you have not heard from us then please contact us. The fastest way to get your results is to register for My Chart.   IF you received an x-ray today, you will receive an invoice from [facility]. Please contact [facility] at [phone] with questions or concerns regarding your invoice.   IF you received labwork today, you will receive an invoice from [facility]. Please contact [facility] at [phone] with questions or concerns regarding your invoice.   Our billing staff will not be able to assist you with questions regarding bills from these companies.  You will be contacted with the lab results as soon as they are available. The fastest way to get your results is to activate your My Chart account. Instructions are located on the last page of this paperwork. If you have not heard from us regarding the results in 2 weeks, please contact this office.     Preventive Care 40-64 Years Old, Female Preventive care refers to visits with your health care provider and lifestyle choices that can promote health and wellness. This includes:  A yearly physical exam. This may also be called an annual well check.  Regular dental visits and eye exams.  Immunizations.  Screening for certain conditions.  Healthy lifestyle choices, such as eating a healthy diet, getting regular exercise, not using drugs or products that contain nicotine and tobacco, and limiting alcohol use. What can I expect for my preventive care visit? Physical exam Your health care provider will check your:  Height and weight. This may be used to calculate body mass index (BMI), which tells if you are at a healthy weight.  Heart rate and blood pressure.  Skin for abnormal spots. Counseling Your health care provider may ask you questions about your:  Alcohol, tobacco, and drug use.  Emotional  well-being.  Home and relationship well-being.  Sexual activity.  Eating habits.  Work and work environment.  Method of birth control.  Menstrual cycle.  Pregnancy history. What immunizations do I need?  Influenza (flu) vaccine  This is recommended every year. Tetanus, diphtheria, and pertussis (Tdap) vaccine  You may need a Td booster every 10 years. Varicella (chickenpox) vaccine  You may need this if you have not been vaccinated. Zoster (shingles) vaccine  You may need this after age 60. Measles, mumps, and rubella (MMR) vaccine  You may need at least one dose of MMR if you were born in 1957 or later. You may also need a second dose. Pneumococcal conjugate (PCV13) vaccine  You may need this if you have certain conditions and were not previously vaccinated. Pneumococcal polysaccharide (PPSV23) vaccine  You may need one or two doses if you smoke cigarettes or if you have certain conditions. Meningococcal conjugate (MenACWY) vaccine  You may need this if you have certain conditions. Hepatitis A vaccine  You may need this if you have certain conditions or if you travel or work in places where you may be exposed to hepatitis A. Hepatitis B vaccine  You may need this if you have certain conditions or if you travel or work in places where you may be exposed to hepatitis B. Haemophilus influenzae type b (Hib) vaccine  You may need this if you have certain conditions. Human papillomavirus (HPV) vaccine  If recommended by your health care provider, you may need three doses over 6 months.   You may receive vaccines as individual doses or as more than one vaccine together in one shot (combination vaccines). Talk with your health care provider about the risks and benefits of combination vaccines. What tests do I need? Blood tests  Lipid and cholesterol levels. These may be checked every 5 years, or more frequently if you are over 50 years old.  Hepatitis C  test.  Hepatitis B test. Screening  Lung cancer screening. You may have this screening every year starting at age 55 if you have a 30-pack-year history of smoking and currently smoke or have quit within the past 15 years.  Colorectal cancer screening. All adults should have this screening starting at age 50 and continuing until age 75. Your health care provider may recommend screening at age 45 if you are at increased risk. You will have tests every 1-10 years, depending on your results and the type of screening test.  Diabetes screening. This is done by checking your blood sugar (glucose) after you have not eaten for a while (fasting). You may have this done every 1-3 years.  Mammogram. This may be done every 1-2 years. Talk with your health care provider about when you should start having regular mammograms. This may depend on whether you have a family history of breast cancer.  BRCA-related cancer screening. This may be done if you have a family history of breast, ovarian, tubal, or peritoneal cancers.  Pelvic exam and Pap test. This may be done every 3 years starting at age 21. Starting at age 30, this may be done every 5 years if you have a Pap test in combination with an HPV test. Other tests  Sexually transmitted disease (STD) testing.  Bone density scan. This is done to screen for osteoporosis. You may have this scan if you are at high risk for osteoporosis. Follow these instructions at home: Eating and drinking  Eat a diet that includes fresh fruits and vegetables, whole grains, lean protein, and low-fat dairy.  Take vitamin and mineral supplements as recommended by your health care provider.  Do not drink alcohol if: ? Your health care provider tells you not to drink. ? You are pregnant, may be pregnant, or are planning to become pregnant.  If you drink alcohol: ? Limit how much you have to 0-1 drink a day. ? Be aware of how much alcohol is in your drink. In the [place] one  drink equals one 12 oz bottle of beer (355 mL), one 5 oz glass of wine (148 mL), or one 1 oz glass of hard liquor (44 mL). Lifestyle  Take daily care of your teeth and gums.  Stay active. Exercise for at least 30 minutes on 5 or more days each week.  Do not use any products that contain nicotine or tobacco, such as cigarettes, e-cigarettes, and chewing tobacco. If you need help quitting, ask your health care provider.  If you are sexually active, practice safe sex. Use a condom or other form of birth control (contraception) in order to prevent pregnancy and STIs (sexually transmitted infections).  If told by your health care provider, take low-dose aspirin daily starting at age 50. What's next?  Visit your health care provider once a year for a well check visit.  Ask your health care provider how often you should have your eyes and teeth checked.  Stay up to date on all vaccines. This information is not intended to replace advice given to you by your health care provider. Make sure   you discuss any questions you have with your health care provider. Document Released: 09/14/2015 Document Revised: 04/29/2018 Document Reviewed: 04/29/2018 Elsevier Patient Education  2020 [employer]  

## 2019-03-09 LAB — CMP14+EGFR
ALT: 14 IU/L (ref 0–32)
AST: 16 IU/L (ref 0–40)
Albumin/Globulin Ratio: 1.6 (ref 1.2–2.2)
Albumin: 4.1 g/dL (ref 3.8–4.8)
Alkaline Phosphatase: 65 IU/L (ref 39–117)
BUN/Creatinine Ratio: 11 (ref 9–23)
BUN: 7 mg/dL (ref 6–24)
Bilirubin Total: 0.5 mg/dL (ref 0.0–1.2)
CO2: 19 mmol/L — ABNORMAL LOW (ref 20–29)
Calcium: 8.8 mg/dL (ref 8.7–10.2)
Chloride: 107 mmol/L — ABNORMAL HIGH (ref 96–106)
Creatinine, Ser: 0.65 mg/dL (ref 0.57–1.00)
GFR calc Af Amer: 121 mL/min/{1.73_m2} (ref >59)
GFR calc non Af Amer: 105 mL/min/{1.73_m2} (ref >59)
Globulin, Total: 2.5 g/dL (ref 1.5–4.5)
Glucose: 90 mg/dL (ref 65–99)
Potassium: 4.3 mmol/L (ref 3.5–5.2)
Sodium: 140 mmol/L (ref 134–144)
Total Protein: 6.6 g/dL (ref 6.0–8.5)

## 2019-03-09 LAB — CBC
Hematocrit: 41.1 % (ref 34.0–46.6)
Hemoglobin: 13.8 g/dL (ref 11.1–15.9)
MCH: 26.9 pg (ref 26.6–33.0)
MCHC: 33.6 g/dL (ref 31.5–35.7)
MCV: 80 fL (ref 79–97)
Platelets: 366 10*3/uL (ref 150–450)
RBC: 5.13 x10E6/uL (ref 3.77–5.28)
RDW: 13.5 % (ref 11.7–15.4)
WBC: 6.7 10*3/uL (ref 3.4–10.8)

## 2019-03-09 LAB — LIPID PANEL
Chol/HDL Ratio: 3 ratio (ref 0.0–4.4)
Cholesterol, Total: 188 mg/dL (ref 100–199)
HDL: 63 mg/dL (ref >39)
LDL Calculated: 108 mg/dL — ABNORMAL HIGH (ref 0–99)
Triglycerides: 83 mg/dL (ref 0–149)
VLDL Cholesterol Cal: 17 mg/dL (ref 5–40)

## 2019-03-09 LAB — TSH: TSH: 2.32 u[IU]/mL (ref 0.450–4.500)

## 2019-03-31 DIAGNOSIS — H524 Presbyopia: Secondary | ICD-10-CM | POA: Diagnosis not present

## 2019-03-31 DIAGNOSIS — H52203 Unspecified astigmatism, bilateral: Secondary | ICD-10-CM | POA: Diagnosis not present

## 2019-03-31 DIAGNOSIS — H5213 Myopia, bilateral: Secondary | ICD-10-CM | POA: Diagnosis not present

## 2019-04-12 MED FILL — LOSARTAN POTASSIUM 50 MG TA: 50 | 30 days supply | Qty: 30 | Fill #1

## 2019-04-25 ENCOUNTER — Encounter: Payer: 59 | Admitting: Family Medicine

## 2019-04-27 ENCOUNTER — Ambulatory Visit
Admission: RE | Admit: 2019-04-27 | Discharge: 2019-04-27 | Disposition: A | Discharge status: Home / Self Care | Payer: 59 | Source: Ambulatory Visit | Attending: Family Medicine | Admitting: Family Medicine

## 2019-04-27 ENCOUNTER — Other Ambulatory Visit: Payer: Self-pay

## 2019-04-27 DIAGNOSIS — Z1231 Encounter for screening mammogram for malignant neoplasm of breast: Secondary | ICD-10-CM | POA: Diagnosis not present

## 2019-04-29 ENCOUNTER — Other Ambulatory Visit: Payer: Self-pay | Admitting: Family Medicine

## 2019-04-29 DIAGNOSIS — R928 Other abnormal and inconclusive findings on diagnostic imaging of breast: Secondary | ICD-10-CM

## 2019-05-11 ENCOUNTER — Ambulatory Visit
Admission: RE | Admit: 2019-05-11 | Discharge: 2019-05-11 | Disposition: A | Discharge status: Home / Self Care | Payer: 59 | Source: Ambulatory Visit | Attending: Family Medicine | Admitting: Family Medicine

## 2019-05-11 ENCOUNTER — Other Ambulatory Visit: Payer: Self-pay

## 2019-05-11 ENCOUNTER — Other Ambulatory Visit: Payer: Self-pay | Admitting: Family Medicine

## 2019-05-11 DIAGNOSIS — N6320 Unspecified lump in the left breast, unspecified quadrant: Secondary | ICD-10-CM

## 2019-05-11 DIAGNOSIS — R928 Other abnormal and inconclusive findings on diagnostic imaging of breast: Secondary | ICD-10-CM

## 2019-05-11 DIAGNOSIS — N6489 Other specified disorders of breast: Secondary | ICD-10-CM | POA: Diagnosis not present

## 2019-05-16 ENCOUNTER — Ambulatory Visit
Admission: RE | Admit: 2019-05-16 | Discharge: 2019-05-16 | Disposition: A | Discharge status: Home / Self Care | Payer: 59 | Source: Ambulatory Visit | Attending: Family Medicine | Admitting: Family Medicine

## 2019-05-16 ENCOUNTER — Other Ambulatory Visit: Payer: Self-pay

## 2019-05-16 DIAGNOSIS — N6321 Unspecified lump in the left breast, upper outer quadrant: Secondary | ICD-10-CM | POA: Diagnosis not present

## 2019-05-16 DIAGNOSIS — N6320 Unspecified lump in the left breast, unspecified quadrant: Secondary | ICD-10-CM

## 2019-05-16 DIAGNOSIS — C50412 Malignant neoplasm of upper-outer quadrant of left female breast: Secondary | ICD-10-CM | POA: Diagnosis not present

## 2019-05-16 HISTORY — PX: BREAST BIOPSY: SHX20

## 2019-05-19 MED FILL — LOSARTAN POTASSIUM 50 MG TA: 50 | 30 days supply | Qty: 30 | Fill #2

## 2019-05-20 ENCOUNTER — Encounter: Payer: Self-pay | Admitting: *Deleted

## 2019-05-20 DIAGNOSIS — Z170 Estrogen receptor positive status [ER+]: Secondary | ICD-10-CM | POA: Insufficient documentation

## 2019-05-20 DIAGNOSIS — C50412 Malignant neoplasm of upper-outer quadrant of left female breast: Secondary | ICD-10-CM | POA: Insufficient documentation

## 2019-05-23 ENCOUNTER — Encounter: Payer: 59 | Admitting: Family Medicine

## 2019-05-24 NOTE — Progress Notes (Signed)
[place] NOTE  Patient Care Team: Rutherford Guys, MD as PCP - General (Family Medicine) Mauro Kaufmann, RN as Oncology Nurse Navigator Rockwell Germany, RN as Oncology Nurse Navigator [name], MD as Consulting Physician (General Surgery) Nicholas Lose, MD as Consulting Physician (Hematology and Oncology) Kyung Rudd, MD as Consulting Physician (Radiation Oncology)  CHIEF COMPLAINTS/PURPOSE OF CONSULTATION:  Newly diagnosed breast cancer  HISTORY OF PRESENTING ILLNESS:  Erin Gallagher 48 y.o. female is here because of recent diagnosis of invasive ductal carcinoma of the left breast. The cancer was detected on a routine screening mammogram on 04/27/19. Diagnostic mammogram on 05/11/19 showed a 1.4cm left breast mass at the 2:30 position and no axillary adenopathy. Biopsy on 05/16/19 showed invasive ductal carcinoma with DCIS, grade 1, HER-2 negative (1+), ER+ 100%, PR+ 100%, Ki67 10%. She presents to the clinic today for initial evaluation and discussion of treatment options.   I reviewed her records extensively and collaborated the history with the patient.  SUMMARY OF ONCOLOGIC HISTORY: Oncology History  Malignant neoplasm of upper-outer quadrant of left breast in female, estrogen receptor positive ([place])  05/20/2019 Initial Diagnosis   Routine screening mammogram detected a 1.4cm left breast mass at the 2:30 position, no axillary adenopathy. 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      MEDICAL HISTORY:  Past Medical History:  Diagnosis Date  . Gall bladder disease    gall bladder removal  . Hypertension     SURGICAL HISTORY: Past Surgical History:  Procedure Laterality Date  . CESAREAN SECTION    . CHOLECYSTECTOMY    . TUBAL LIGATION      SOCIAL HISTORY: Social History   Socioeconomic History  . Marital status: Married    Spouse name: Not on file  . Number of children: 1  . Years of education:  Not on file  . Highest education level: Not on file  Occupational History  . Occupation: Therapist, music  . Financial resource strain: Not on file  . Food insecurity    Worry: Not on file    Inability: Not on file  . Transportation needs    Medical: Not on file    Non-medical: Not on file  Tobacco Use  . Smoking status: Never Smoker  . Smokeless tobacco: Never Used  Substance and Sexual Activity  . Alcohol use: Yes    Alcohol/week: 2.0 standard drinks    Types: 2 Cans of beer per week    Comment: occasionally  . Drug use: No  . Sexual activity: Yes    Birth control/protection: Other-see comments, Surgical    Comment: Tubal ligation2  Lifestyle  . Physical activity    Days per week: 3 days    Minutes per session: 50 min  . Stress: Not on file  Relationships  . Social Herbalist on phone: Not on file    Gets together: Not on file    Attends religious service: Not on file    Active member of club or organization: Not on file    Attends meetings of clubs or organizations: Not on file    Relationship status: Not on file  . Intimate partner violence    Fear of current or ex partner: Not on file    Emotionally abused: Not on file    Physically abused: Not on file    Forced sexual activity: Not on file  Other  Topics Concern  . Not on file  Social History Narrative  . Not on file    FAMILY HISTORY: Family History  Problem Relation Age of Onset  . Hypertension Mother   . Heart disease Father   . Hypertension Father   . Colon cancer Father   . Stroke Maternal Grandmother   . Hypertension Maternal Grandmother   . Stomach cancer Neg Hx   . Esophageal cancer Neg Hx   . Rectal cancer Neg Hx   . Liver cancer Neg Hx     ALLERGIES:  is allergic to other.  MEDICATIONS:  Current Outpatient Medications  Medication Sig Dispense Refill  . amLODipine (NORVASC) 10 MG tablet Take 1 tablet (10 mg total) by mouth daily. 90 tablet 1  . cyclobenzaprine  (FLEXERIL) 5 MG tablet 1 pill by mouth up to every 8 hours as needed. Start with one pill by mouth each bedtime as needed due to sedation 15 tablet 0  . losartan (COZAAR) 50 MG tablet Take 1 tablet (50 mg total) by mouth daily. 90 tablet 1   Current Facility-Administered Medications  Medication Dose Route Frequency Provider Last Rate Last Dose  . 0.9 %  sodium chloride infusion  500 mL Intravenous Continuous Danis, Estill Cotta III, MD        REVIEW OF SYSTEMS:   Constitutional: Denies fevers, chills or abnormal night sweats Eyes: Denies blurriness of vision, double vision or watery eyes Ears, nose, mouth, throat, and face: Denies mucositis or sore throat Respiratory: Denies cough, dyspnea or wheezes Cardiovascular: Denies palpitation, chest discomfort or lower extremity swelling Gastrointestinal:  Denies nausea, heartburn or change in bowel habits Skin: Denies abnormal skin rashes Lymphatics: Denies new lymphadenopathy or easy bruising Neurological:Denies numbness, tingling or new weaknesses Behavioral/Psych: Mood is stable, no new changes  Breast: Denies any palpable lumps or discharge All other systems were reviewed with the patient and are negative.  PHYSICAL EXAMINATION: ECOG PERFORMANCE STATUS: 1 - Symptomatic but completely ambulatory  Vitals:   05/25/19 0839  BP: 126/82  Pulse: 87  Resp: 18  Temp: 98 F (36.7 C)  SpO2: 100%   Filed Weights   05/25/19 0839  Weight: 253 lb 12.8 oz (115.1 kg)    GENERAL:alert, no distress and comfortable SKIN: skin color, texture, turgor are normal, no rashes or significant lesions EYES: normal, conjunctiva are pink and non-injected, sclera clear OROPHARYNX:no exudate, no erythema and lips, buccal mucosa, and tongue normal  NECK: supple, thyroid normal size, non-tender, without nodularity LYMPH:  no palpable lymphadenopathy in the cervical, axillary or inguinal LUNGS: clear to auscultation and percussion with normal breathing effort  HEART: regular rate & rhythm and no murmurs and no lower extremity edema ABDOMEN:abdomen soft, non-tender and normal bowel sounds Musculoskeletal:no cyanosis of digits and no clubbing  PSYCH: alert & oriented x 3 with fluent speech NEURO: no focal motor/sensory deficits BREAST: No palpable nodules in breast. No palpable axillary or supraclavicular lymphadenopathy (exam performed in the presence of a chaperone)   LABORATORY DATA:  I have reviewed the data as listed Lab Results  Component Value Date   WBC 7.0 05/25/2019   HGB 13.7 05/25/2019   HCT 41.8 05/25/2019   MCV 82.8 05/25/2019   PLT 344 05/25/2019   Lab Results  Component Value Date   NA 140 05/25/2019   K 4.1 05/25/2019   CL 107 05/25/2019   CO2 26 05/25/2019    RADIOGRAPHIC STUDIES: I have personally reviewed the radiological reports and agreed with the findings  in the report.  ASSESSMENT AND PLAN:  Malignant neoplasm of upper-outer quadrant of left breast in female, estrogen receptor positive ([place]) 05/20/2019: Routine screening mammogram detected a 1.4cm left breast mass at the 2:30 position, no axillary adenopathy. 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  T1CN0 stage Ia clinical stage  Pathology and radiology counseling:Discussed with the patient, the details of pathology including the type of breast cancer,the clinical staging, the significance of ER, PR and HER-2/neu receptors and the implications for treatment. After reviewing the pathology in detail, we proceeded to discuss the different treatment options between surgery, radiation, chemotherapy, antiestrogen therapies.  Recommendations: 1. Breast conserving surgery followed by 2. Oncotype DX testing to determine if chemotherapy would be of any benefit followed by 3. Adjuvant radiation therapy followed by 4. Adjuvant antiestrogen therapy Genetic testing  Oncotype counseling: I discussed Oncotype DX test. I explained to  the patient that this is a 21 gene panel to evaluate patient tumors DNA to calculate recurrence score. This would help determine whether patient has high risk or intermediate risk or low risk breast cancer. She understands that if her tumor was found to be high risk, she would benefit from systemic chemotherapy. If low risk, no need of chemotherapy. If she was found to be intermediate risk, we would need to evaluate the score as well as other risk factors and determine if an abbreviated chemotherapy may be of benefit.  Return to clinic after surgery to discuss final pathology report and then determine if Oncotype DX testing will need to be sent.  All questions were answered. The patient knows to call the clinic with any problems, questions or concerns.   Rulon Eisenmenger, MD 05/25/2019    I, Molly Dorshimer, am acting as scribe for Nicholas Lose, MD.  I have reviewed the above documentation for accuracy and completeness, and I agree with the above.

## 2019-05-25 ENCOUNTER — Encounter: Payer: Self-pay | Admitting: *Deleted

## 2019-05-25 ENCOUNTER — Inpatient Hospital Stay: Payer: 59

## 2019-05-25 ENCOUNTER — Other Ambulatory Visit: Payer: Self-pay

## 2019-05-25 ENCOUNTER — Ambulatory Visit: Payer: 59 | Attending: General Surgery | Admitting: Physical Therapy

## 2019-05-25 ENCOUNTER — Inpatient Hospital Stay (HOSPITAL_BASED_OUTPATIENT_CLINIC_OR_DEPARTMENT_OTHER): Payer: 59 | Admitting: Hematology and Oncology

## 2019-05-25 ENCOUNTER — Encounter: Payer: Self-pay | Admitting: Physical Therapy

## 2019-05-25 ENCOUNTER — Ambulatory Visit (HOSPITAL_BASED_OUTPATIENT_CLINIC_OR_DEPARTMENT_OTHER): Payer: 59 | Admitting: Licensed Clinical Social Worker

## 2019-05-25 ENCOUNTER — Ambulatory Visit
Admission: RE | Admit: 2019-05-25 | Discharge: 2019-05-25 | Disposition: A | Discharge status: Home / Self Care | Payer: 59 | Source: Ambulatory Visit | Attending: Radiation Oncology | Admitting: Radiation Oncology

## 2019-05-25 ENCOUNTER — Encounter: Payer: Self-pay | Admitting: Licensed Clinical Social Worker

## 2019-05-25 ENCOUNTER — Other Ambulatory Visit: Payer: Self-pay | Admitting: General Surgery

## 2019-05-25 DIAGNOSIS — I10 Essential (primary) hypertension: Secondary | ICD-10-CM | POA: Insufficient documentation

## 2019-05-25 DIAGNOSIS — Z170 Estrogen receptor positive status [ER+]: Secondary | ICD-10-CM | POA: Insufficient documentation

## 2019-05-25 DIAGNOSIS — C50412 Malignant neoplasm of upper-outer quadrant of left female breast: Secondary | ICD-10-CM

## 2019-05-25 DIAGNOSIS — Z79899 Other long term (current) drug therapy: Secondary | ICD-10-CM | POA: Insufficient documentation

## 2019-05-25 DIAGNOSIS — R293 Abnormal posture: Secondary | ICD-10-CM | POA: Diagnosis not present

## 2019-05-25 DIAGNOSIS — Z800 Family history of malignant neoplasm of digestive organs: Secondary | ICD-10-CM | POA: Insufficient documentation

## 2019-05-25 DIAGNOSIS — Z801 Family history of malignant neoplasm of trachea, bronchus and lung: Secondary | ICD-10-CM

## 2019-05-25 DIAGNOSIS — Z803 Family history of malignant neoplasm of breast: Secondary | ICD-10-CM

## 2019-05-25 LAB — CBC WITH DIFFERENTIAL (CANCER CENTER ONLY)
Abs Immature Granulocytes: 0.01 10*3/uL (ref 0.00–0.07)
Basophils Absolute: 0 10*3/uL (ref 0.0–0.1)
Basophils Relative: 0 %
Eosinophils Absolute: 0.1 10*3/uL (ref 0.0–0.5)
Eosinophils Relative: 1 %
HCT: 41.8 % (ref 36.0–46.0)
Hemoglobin: 13.7 g/dL (ref 12.0–15.0)
Immature Granulocytes: 0 %
Lymphocytes Relative: 26 %
Lymphs Abs: 1.8 10*3/uL (ref 0.7–4.0)
MCH: 27.1 pg (ref 26.0–34.0)
MCHC: 32.8 g/dL (ref 30.0–36.0)
MCV: 82.8 fL (ref 80.0–100.0)
Monocytes Absolute: 0.4 10*3/uL (ref 0.1–1.0)
Monocytes Relative: 6 %
Neutro Abs: 4.6 10*3/uL (ref 1.7–7.7)
Neutrophils Relative %: 67 %
Platelet Count: 344 10*3/uL (ref 150–400)
RBC: 5.05 MIL/uL (ref 3.87–5.11)
RDW: 14.2 % (ref 11.5–15.5)
WBC Count: 7 10*3/uL (ref 4.0–10.5)
nRBC: 0 % (ref 0.0–0.2)

## 2019-05-25 LAB — CMP (CANCER CENTER ONLY)
ALT: 13 U/L (ref 0–44)
AST: 14 U/L — ABNORMAL LOW (ref 15–41)
Albumin: 3.9 g/dL (ref 3.5–5.0)
Alkaline Phosphatase: 65 U/L (ref 38–126)
Anion gap: 7 (ref 5–15)
BUN: 9 mg/dL (ref 6–20)
CO2: 26 mmol/L (ref 22–32)
Calcium: 8.9 mg/dL (ref 8.9–10.3)
Chloride: 107 mmol/L (ref 98–111)
Creatinine: 0.75 mg/dL (ref 0.44–1.00)
GFR, Est AFR Am: >60 mL/min (ref >60)
GFR, Estimated: >60 mL/min (ref >60)
Glucose, Bld: 111 mg/dL — ABNORMAL HIGH (ref 70–99)
Potassium: 4.1 mmol/L (ref 3.5–5.1)
Sodium: 140 mmol/L (ref 135–145)
Total Bilirubin: 0.3 mg/dL (ref 0.3–1.2)
Total Protein: 7.1 g/dL (ref 6.5–8.1)

## 2019-05-25 NOTE — Patient Instructions (Signed)

## 2019-05-25 NOTE — Therapy (Signed)
[place], [place], [address] Phone: [phone]   Fax:  [phone]  Physical Therapy Evaluation  Patient Details  Name: Erin Gallagher MRN: [id number] Date of Birth: [birth year] Referring Provider (PT): Dr. [name]   Encounter Date: 05/25/2019  PT End of Session - 05/25/19 1022    Visit Number  1    Number of Visits  2    Date for PT Re-Evaluation  07/20/19    PT Start Time  0922    PT Stop Time  0948    PT Time Calculation (min)  26 min    Activity Tolerance  Patient tolerated treatment well    Behavior During Therapy  [facility] for tasks assessed/performed       Past Medical History:  Diagnosis Date  . Family history of breast cancer   . Family history of colon cancer   . Family history of lung cancer   . Family history of throat cancer   . Gall bladder disease    gall bladder removal  . Hypertension     Past Surgical History:  Procedure Laterality Date  . CESAREAN SECTION    . CHOLECYSTECTOMY    . TUBAL LIGATION      There were no vitals filed for this visit.   Subjective Assessment - 05/25/19 0954    Subjective  Patient reports she is here today to be seen by her medical team for her newly diagnosed left breast cancer.    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 She measures 1.4 cm and is located in the upper outer quadrant. It is ER/PR positive and HER2 negative with a Ki67 of 10%.    Patient Stated Goals  Reduce lymphedema risk and learn post op shoulder ROM HEP    Currently in Pain?  No/denies         [facility] PT Assessment - 05/25/19 0001      Assessment   Medical Diagnosis  Left breast cancer    Referring Provider (PT)  Dr. [name]    Onset Date/Surgical Date  04/27/19    Hand Dominance  Right    Prior Therapy  none      Precautions   Precautions  Other (comment)    Precaution Comments   active cancer      Restrictions   Weight Bearing Restrictions  No      Balance Screen   Has the patient fallen in the past 6 months  No    Has the patient had a decrease in activity level because of a fear of falling?   No    Is the patient reluctant to leave their home because of a fear of falling?   No      Home Environment   Living Environment  Private residence    Living Arrangements  Spouse/significant other;Children   Husband and 32 y.o. son   Available Help at Discharge  Family      Prior Function   Level of [place]  Full time employment    Vocation Requirements  Endoscopy tech at [employer]  She walks 1 hour 3x/week      Cognition   Overall Cognitive Status  Within Functional Limits for tasks assessed      Posture/Postural Control   Posture/Postural Control  Postural limitations    Postural Limitations  Rounded Shoulders;Forward  head      ROM / Strength   AROM / PROM / Strength  AROM;Strength      AROM   Overall AROM Comments  Cervical AROM is WNL    AROM Assessment Site  Shoulder    Right/Left Shoulder  Right;Left    Right Shoulder Extension  40 Degrees    Right Shoulder Flexion  153 Degrees    Right Shoulder ABduction  148 Degrees    Right Shoulder Internal Rotation  62 Degrees    Right Shoulder External Rotation  77 Degrees    Left Shoulder Extension  44 Degrees    Left Shoulder Flexion  141 Degrees    Left Shoulder ABduction  160 Degrees    Left Shoulder Internal Rotation  50 Degrees    Left Shoulder External Rotation  85 Degrees      Strength   Overall Strength  Within functional limits for tasks performed        LYMPHEDEMA/ONCOLOGY QUESTIONNAIRE - 05/25/19 1003      Type   Cancer Type  Left breast cancer      Lymphedema Assessments   Lymphedema Assessments  Upper extremities      Right Upper Extremity Lymphedema   10 cm Proximal to Olecranon Process  40.9 cm    Olecranon Process  28 cm    10 cm  Proximal to Ulnar Styloid Process  23.9 cm    Just Proximal to Ulnar Styloid Process  17.5 cm    Across Hand at [employer]  20.5 cm    At [place] of 2nd Digit  6.5 cm      Left Upper Extremity Lymphedema   10 cm Proximal to Olecranon Process  38.9 cm    Olecranon Process  27.6 cm    10 cm Proximal to Ulnar Styloid Process  23 cm    Just Proximal to Ulnar Styloid Process  17.1 cm    Across Hand at [employer]  20 cm    At [place] of 2nd Digit  6.4 cm          Quick Dash - 05/25/19 0001    Open a tight or new jar  No difficulty    Do heavy household chores (wash walls, wash floors)  No difficulty    Carry a shopping bag or briefcase  No difficulty    Wash your back  No difficulty    Use a knife to cut food  No difficulty    Recreational activities in which you take some force or impact through your arm, shoulder, or hand (golf, hammering, tennis)  No difficulty    During the past week, to what extent has your arm, shoulder or hand problem interfered with your normal social activities with family, friends, neighbors, or groups?  Not at all    During the past week, to what extent has your arm, shoulder or hand problem limited your work or other regular daily activities  Not at all    Arm, shoulder, or hand pain.  None    Tingling (pins and needles) in your arm, shoulder, or hand  None    Difficulty Sleeping  No difficulty    DASH Score  0 %        Objective measurements completed on examination: See above findings.    Patient was instructed today in a home exercise program today for post op shoulder range of motion. These included active assist shoulder flexion in sitting, scapular retraction, wall  walking with shoulder abduction, and hands behind head external rotation.  She was encouraged to do these twice a day, holding 3 seconds and repeating 5 times when permitted by her physician.       PT Education - 05/25/19 1021    Education Details  Lymphedema risk reduction and  post op shoulder ROM HEP    Person(s) Educated  Patient    Methods  Explanation;Demonstration;Handout    Comprehension  Returned demonstration;Verbalized understanding          PT Long Term Goals - 05/25/19 1035      PT LONG TERM GOAL #1   Title  Patient will demonstrate she has regained full shoulder ROM and function post operatively compared to baselines.    Time  [facility] Goals - 05/25/19 1034      Patient will be able to verbalize understanding of pertinent lymphedema risk reduction practices relevant to her diagnosis specifically related to skin care.   Time  1    Period  Days    Status  Achieved      Patient will be able to return demonstrate and/or verbalize understanding of the post-op home exercise program related to regaining shoulder range of motion.   Time  1    Period  Days    Status  Achieved      Patient will be able to verbalize understanding of the importance of attending the postoperative After Breast Cancer Class for further lymphedema risk reduction education and therapeutic exercise.   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 She measures 1.4 cm and is located in the upper outer quadrant. It is ER/PR positive and HER2 negative with a Ki67 of 10%. Her multidisciplinary medical team met prior to her assessments to determine a recommended treatment plan. She is planning to have a left lumpectomy and sentinel node biopsy followed by Oncotype testing, radiaiton, and anti-estrogen therapy. She will benefit from a post op PT reassessment to determine needs.    Stability/Clinical Decision Making  Stable/Uncomplicated    Clinical Decision Making  Low    Rehab Potential  Excellent    PT Frequency  --   Eval and 1 f/u visit   PT Treatment/Interventions   ADLs/Self Care Home Management;Therapeutic exercise;Patient/family education    PT Next Visit Plan  Will reassess 3-4 weeks post op to determine needs    PT Home Exercise Plan  Post op shoulder ROM HEP    Consulted and Agree with Plan of Care  Patient       Patient will benefit from skilled therapeutic intervention in order to improve the following deficits and impairments:  Postural dysfunction, Decreased range of motion, Pain, Impaired UE functional use, Decreased knowledge of precautions  Visit Diagnosis: Malignant neoplasm of upper-outer quadrant of left breast in female, estrogen receptor positive ([place]) - Plan: PT plan of care cert/re-cert  Abnormal posture - Plan: PT plan of care cert/re-cert   Patient will follow up at outpatient cancer rehab 3-4 weeks following surgery.  If the patient requires physical therapy at that time, a specific plan will be dictated and sent to the referring physician for  approval. The patient was educated today on appropriate basic range of motion exercises to begin post operatively and the importance of attending the After Breast Cancer class following surgery.  Patient was educated today on lymphedema risk reduction practices as it pertains to recommendations that will benefit the patient immediately following surgery.  She verbalized good understanding.     Problem List Patient Active Problem List   Diagnosis Date Noted  . Family history of breast cancer   . Family history of throat cancer   . Family history of lung cancer   . Family history of colon cancer   . Malignant neoplasm of upper-outer quadrant of left breast in female, estrogen receptor positive ([place]) 05/20/2019  . Arthritis 11/23/2017  . Right-sided low back pain without sciatica 12/31/2015  . Left knee pain 11/28/2014  . HTN (hypertension) 06/22/2013   Annia Friendly, PT 05/25/19 10:41 AM  [place], [place], [address] Phone: [phone]   Fax:  [phone]  Name: Erin Gallagher MRN: [id number] Date of Birth: 1971/07/18

## 2019-05-25 NOTE — Assessment & Plan Note (Signed)
05/20/2019: Routine screening mammogram detected a 1.4cm left breast mass at the 2:30 position, no axillary adenopathy. 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  T1CN0 stage Ia clinical stage  Pathology and radiology counseling:Discussed with the patient, the details of pathology including the type of breast cancer,the clinical staging, the significance of ER, PR and HER-2/neu receptors and the implications for treatment. After reviewing the pathology in detail, we proceeded to discuss the different treatment options between surgery, radiation, chemotherapy, antiestrogen therapies.  Recommendations: 1. Breast conserving surgery followed by 2. Oncotype DX testing to determine if chemotherapy would be of any benefit followed by 3. Adjuvant radiation therapy followed by 4. Adjuvant antiestrogen therapy  Oncotype counseling: I discussed Oncotype DX test. I explained to the patient that this is a 21 gene panel to evaluate patient tumors DNA to calculate recurrence score. This would help determine whether patient has high risk or intermediate risk or low risk breast cancer. She understands that if her tumor was found to be high risk, she would benefit from systemic chemotherapy. If low risk, no need of chemotherapy. If she was found to be intermediate risk, we would need to evaluate the score as well as other risk factors and determine if an abbreviated chemotherapy may be of benefit.  Return to clinic after surgery to discuss final pathology report and then determine if Oncotype DX testing will need to be sent.

## 2019-05-25 NOTE — Progress Notes (Signed)
Radiation Oncology         [phone]) [phone] ________________________________  Name: Erin Gallagher        MRN: [id number]  Date of Service: 05/25/2019 DOB: [birth year]  [id number], Lilia Argue, MD  [name], MD     REFERRING PHYSICIAN: [name], MD   DIAGNOSIS: The encounter diagnosis was Malignant neoplasm of upper-outer quadrant of left breast in female, estrogen receptor positive ([place]).   HISTORY OF PRESENT ILLNESS: Erin Gallagher is a 48 y.o. female seen in the multidisciplinary breast clinic for a new diagnosis of left breast cancer. The patient was noted to have a mass in the left breast on screening mammogram as well as concerns for right axillary adenopathy. She was found on diagnostic imaging that the right axillary adenopathy resolved. She had a lesion in the left breast at 2:30 that measured 1.4 x 1.3 x 1.1 cm, and her axilla was negative. She underwent a biopsy on 05/16/2019 revealed a grade 1, invasive ductal carcinoma with associated DCIS. Her tumor was ER/PR positive, and HER2 negative, with a Ki 67 of 10%. She is seen today to review treatment recommendations for her cancer.     PREVIOUS RADIATION THERAPY: No   PAST MEDICAL HISTORY:  Past Medical History:  Diagnosis Date   Gall bladder disease    gall bladder removal   Hypertension        PAST SURGICAL HISTORY: Past Surgical History:  Procedure Laterality Date   CESAREAN SECTION     CHOLECYSTECTOMY     TUBAL LIGATION       FAMILY HISTORY:  Family History  Problem Relation Age of Onset   Hypertension Mother    Heart disease Father    Hypertension Father    Colon cancer Father    Stroke Maternal Grandmother    Hypertension Maternal Grandmother    Stomach cancer Neg Hx    Esophageal cancer Neg Hx    Rectal cancer Neg Hx    Liver cancer Neg Hx      SOCIAL HISTORY:  reports that she has never smoked. She has never used smokeless tobacco. She reports current alcohol use of about 2.0  standard drinks of alcohol per week. She reports that she does not use drugs. The patient is married and lives in [place]. 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   ALLERGIES: Patient has no known allergies.   MEDICATIONS:  Current Outpatient Medications  Medication Sig Dispense Refill   amLODipine (NORVASC) 10 MG tablet Take 1 tablet (10 mg total) by mouth daily. 90 tablet 1   cyclobenzaprine (FLEXERIL) 5 MG tablet 1 pill by mouth up to every 8 hours as needed. Start with one pill by mouth each bedtime as needed due to sedation 15 tablet 0   losartan (COZAAR) 50 MG tablet Take 1 tablet (50 mg total) by mouth daily. 90 tablet 1   Current Facility-Administered Medications  Medication Dose Route Frequency Provider Last Rate Last Dose   0.9 %  sodium chloride infusion  500 mL Intravenous Continuous Danis, Estill Cotta III, MD         REVIEW OF SYSTEMS: On review of systems, the patient reports that she is doing well overall. She denies any chest pain, shortness of breath, cough, fevers, chills, night sweats, unintended weight changes. She denies any bowel or bladder disturbances, and denies abdominal pain, nausea or vomiting. She denies any new musculoskeletal or joint aches or pains. A complete review of systems is obtained  and is otherwise negative.     PHYSICAL EXAM:  Wt Readings from Last 3 Encounters:  03/08/19 251 lb (113.9 kg)  06/17/18 249 lb (112.9 kg)  04/30/18 245 lb 6.4 oz (111.3 kg)   Temp Readings from Last 3 Encounters:  03/08/19 98 F (36.7 C) (Oral)  06/17/18 98.1 F (36.7 C) (Oral)  04/30/18 98.6 F (37 C) (Oral)   BP Readings from Last 3 Encounters:  03/08/19 (!) 142/99  06/17/18 129/84  04/30/18 126/85   Pulse Readings from Last 3 Encounters:  03/08/19 76  06/17/18 75  04/30/18 82    In general this is a well appearing African American female in no acute distress. She's alert and oriented x4 and appropriate throughout the examination.  Cardiopulmonary assessment is negative for acute distress and she exhibits normal effort. Breast exam is deferred.    ECOG = 0  0 - Asymptomatic (Fully active, able to carry on all predisease activities without restriction)  1 - Symptomatic but completely ambulatory (Restricted in physically strenuous activity but ambulatory and able to carry out work of a light or sedentary nature. For example, light housework, office work)  2 - Symptomatic, <50% in bed during the day (Ambulatory and capable of all self care but unable to carry out any work activities. Up and about more than 50% of waking hours)  3 - Symptomatic, >50% in bed, but not bedbound (Capable of only limited self-care, confined to bed or chair 50% or more of waking hours)  4 - Bedbound (Completely disabled. Cannot carry on any self-care. Totally confined to bed or chair)  5 - Death   Eustace Pen MM, Creech RH, Tormey DC, et al. [phone]). "Toxicity and response criteria of the [facility]". [place] Oncol. 5 (6): 649-55    LABORATORY DATA:  Lab Results  Component Value Date   WBC 6.7 03/08/2019   HGB 13.8 03/08/2019   HCT 41.1 03/08/2019   MCV 80 03/08/2019   PLT 366 03/08/2019   Lab Results  Component Value Date   NA 140 03/08/2019   K 4.3 03/08/2019   CL 107 (H) 03/08/2019   CO2 19 (L) 03/08/2019   Lab Results  Component Value Date   ALT 14 03/08/2019   AST 16 03/08/2019   ALKPHOS 65 03/08/2019   BILITOT 0.5 03/08/2019      RADIOGRAPHY: Mm Digital Screening Bilateral  Result Date: 04/28/2019 CLINICAL DATA:  Screening. EXAM: DIGITAL SCREENING BILATERAL MAMMOGRAM WITH CAD COMPARISON:  None available. ACR Breast Density Category b: There are scattered areas of fibroglandular density. FINDINGS: In the right breast possible right axillary lymphadenopathy requires further evaluation. In the left breast a mass and a focal asymmetry require further evaluation. Images were processed with CAD.  IMPRESSION: Further evaluation is suggested for possible right axillary lymphadenopathy. Further evaluation is suggested for possible left breast mass and focal asymmetry in the left breast. RECOMMENDATION: Diagnostic mammogram and possibly ultrasound of both breasts. (Code:[id number]) The patient will be contacted regarding the findings, and additional imaging will be scheduled. BI-RADS CATEGORY  0: Incomplete. Need additional imaging evaluation and/or prior mammograms for comparison. Electronically Signed   By: Fidela Salisbury M.D.   On: 04/28/2019 14:03   US Breast Ltd Uni Left Inc Axilla  Result Date: 05/11/2019 CLINICAL DATA:  Patient recalled from screening for left breast and right axillary adenopathy. EXAM: DIGITAL DIAGNOSTIC BILATERAL MAMMOGRAM WITH CAD AND TOMO ULTRASOUND BILATERAL BREAST COMPARISON:  Baseline exam. ACR Breast Density Category b:  There are scattered areas of fibroglandular density. FINDINGS: Within the outer left breast middle depth there is a persistent irregular mass further evaluated with spot compression views. Questioned asymmetry within the outer left breast resolved with additional imaging compatible with overlapping fibroglandular tissue. Spot views of the right axilla demonstrates persistent right axillary lymph nodes. Mammographic images were processed with CAD. Targeted ultrasound is performed, showing a 1.4 x 1.1 x 1.3 cm irregular hypoechoic mass left breast 2:30 o'clock 7 cm from the nipple. No left axillary adenopathy. No discrete right axillary adenopathy. IMPRESSION: Suspicious left breast mass 2:30 o'clock. RECOMMENDATION: Ultrasound-guided core needle biopsy left breast mass. I have discussed the findings and recommendations with the patient. If applicable, a reminder letter will be sent to the patient regarding the next appointment. BI-RADS CATEGORY  4: Suspicious. Electronically Signed   By: Lovey Newcomer M.D.   On: 05/11/2019 16:04   Mm Diag Breast Tomo  Bilateral  Result Date: 05/11/2019 CLINICAL DATA:  Patient recalled from screening for left breast and right axillary adenopathy. EXAM: DIGITAL DIAGNOSTIC BILATERAL MAMMOGRAM WITH CAD AND TOMO ULTRASOUND BILATERAL BREAST COMPARISON:  Baseline exam. ACR Breast Density Category b: There are scattered areas of fibroglandular density. FINDINGS: Within the outer left breast middle depth there is a persistent irregular mass further evaluated with spot compression views. Questioned asymmetry within the outer left breast resolved with additional imaging compatible with overlapping fibroglandular tissue. Spot views of the right axilla demonstrates persistent right axillary lymph nodes. Mammographic images were processed with CAD. Targeted ultrasound is performed, showing a 1.4 x 1.1 x 1.3 cm irregular hypoechoic mass left breast 2:30 o'clock 7 cm from the nipple. No left axillary adenopathy. No discrete right axillary adenopathy. IMPRESSION: Suspicious left breast mass 2:30 o'clock. RECOMMENDATION: Ultrasound-guided core needle biopsy left breast mass. I have discussed the findings and recommendations with the patient. If applicable, a reminder letter will be sent to the patient regarding the next appointment. BI-RADS CATEGORY  4: Suspicious. Electronically Signed   By: Lovey Newcomer M.D.   On: 05/11/2019 16:04   [place] Axilla Right  Result Date: 05/11/2019 CLINICAL DATA:  Patient recalled from screening for left breast and right axillary adenopathy. EXAM: DIGITAL DIAGNOSTIC BILATERAL MAMMOGRAM WITH CAD AND TOMO ULTRASOUND BILATERAL BREAST COMPARISON:  Baseline exam. ACR Breast Density Category b: There are scattered areas of fibroglandular density. FINDINGS: Within the outer left breast middle depth there is a persistent irregular mass further evaluated with spot compression views. Questioned asymmetry within the outer left breast resolved with additional imaging compatible with overlapping fibroglandular tissue. Spot views  of the right axilla demonstrates persistent right axillary lymph nodes. Mammographic images were processed with CAD. Targeted ultrasound is performed, showing a 1.4 x 1.1 x 1.3 cm irregular hypoechoic mass left breast 2:30 o'clock 7 cm from the nipple. No left axillary adenopathy. No discrete right axillary adenopathy. IMPRESSION: Suspicious left breast mass 2:30 o'clock. RECOMMENDATION: Ultrasound-guided core needle biopsy left breast mass. I have discussed the findings and recommendations with the patient. If applicable, a reminder letter will be sent to the patient regarding the next appointment. BI-RADS CATEGORY  4: Suspicious. Electronically Signed   By: Lovey Newcomer M.D.   On: 05/11/2019 16:04   Mm Clip Placement Left  Result Date: 05/16/2019 CLINICAL DATA:  Status post ultrasound-guided core biopsy of a left breast mass. EXAM: 3D DIAGNOSTIC LEFT MAMMOGRAM POST ULTRASOUND BIOPSY COMPARISON:  Previous exam(s). FINDINGS: 3D Mammographic images were obtained following ultrasound guided biopsy of the left breast.  Mammographic images show there is a ribbon shaped clip in appropriate position in the upper-outer quadrant of the left breast. IMPRESSION: Status post ultrasound-guided core biopsy of the left breast with pathology pending. Final Assessment: Post Procedure Mammograms for Marker Placement Electronically Signed   By: Lillia Mountain M.D.   On: 05/16/2019 15:42   [place] Lt Breast Bx W Loc Dev 1st Lesion Img Bx Spec US Guide  Addendum Date: 05/18/2019   ADDENDUM REPORT: 05/18/2019 12:53 ADDENDUM: Pathology revealed GRADE I INVASIVE DUCTAL CARCINOMA, DUCTAL CARCINOMA IN SITU of the LEFT breast, 2:30 o'clock. This was found to be concordant by Dr. Lillia Mountain. Pathology results were discussed with the patient by telephone. The patient reported doing well after the biopsy with tenderness at the site. Post biopsy instructions and care were reviewed and questions were answered. The patient was encouraged to call [facility] for any additional concerns. The patient was referred to [facility] on May 25, 2019. Pathology results reported by Stacie Acres, RN on 05/18/2019. Electronically Signed   By: Lillia Mountain M.D.   On: 05/18/2019 12:53   Result Date: 05/18/2019 CLINICAL DATA:  Left breast mass. EXAM: ULTRASOUND GUIDED LEFT BREAST CORE NEEDLE BIOPSY COMPARISON:  Previous exam(s). FINDINGS: I met with the patient and we discussed the procedure of ultrasound-guided biopsy, including benefits and alternatives. We discussed the high likelihood of a successful procedure. We discussed the risks of the procedure, including infection, bleeding, tissue injury, clip migration, and inadequate sampling. Informed written consent was given. The usual time-out protocol was performed immediately prior to the procedure. Lesion quadrant: Upper-outer quadrant Using sterile technique and 1% lidocaine and 1% lidocaine with epinephrine as local anesthetic, under direct ultrasound visualization, a 12 gauge spring-loaded device was used to perform biopsy of a mass in the 2:30 region of the left breast using a lateral to medial approach. At the conclusion of the procedure a ribbon shaped tissue marker clip was deployed into the biopsy cavity. Follow up 2 view mammogram was performed and dictated separately. IMPRESSION: Ultrasound guided biopsy of the left breast. No apparent complications. Electronically Signed: By: Lillia Mountain M.D. On: 05/16/2019 15:19       IMPRESSION/PLAN: 1. 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 Dr. Lisbeth Renshaw discusses the pathology findings and reviews the nature of invasive left breast disease. The consensus from the breast conference includes breast conservation with lumpectomy with  sentinel node biopsy. Depending on the size of the final tumor measurements rendered by  pathology, the tumor may be tested for Oncotype Dx score to determine a role for systemic therapy. Provided that chemotherapy is not indicated, the patient's course would then be followed by external radiotherapy to the breast followed by antiestrogen therapy. We discussed the risks, benefits, short, and long term effects of radiotherapy, and the patient is interested in proceeding. Dr. Lisbeth Renshaw discusses the delivery and logistics of radiotherapy and anticipates a course of 6 1/2 weeks of radiotherapy with deep inspiration breath hold technique. We will see her back about 2 weeks after surgery to discuss the simulation process and anticipate we starting radiotherapy about 4-6 weeks after surgery.  2. Possible genetic predisposition to malignancy. The patient is a candidate for genetic testing given her personal history. She was offered referral and is interested in proceeding with webex call and meeting to discuss testing. 3. Contraceptive counseling. The patient has  previously undergone tubal ligation and will not need Hcg testing prior to radiotherapy.   In a visit lasting 30 minutes, greater than 50% of the time was spent face to face discussing her case, and coordinating the patient's care.  The above documentation reflects my direct findings during this shared patient visit. Please see the separate note by Dr. Lisbeth Renshaw on this date for the remainder of the patient's plan of care.    Carola Rhine, PAC

## 2019-05-25 NOTE — Progress Notes (Signed)
REFERRING PROVIDER: Nicholas Lose, MD [place],  [place] [id number]  PRIMARY PROVIDER:  Rutherford Guys, MD  PRIMARY REASON FOR VISIT:  1. Malignant neoplasm of upper-outer quadrant of left breast in female, estrogen receptor positive ([name])   2. Family history of breast cancer   3. Family history of throat cancer   4. Family history of lung cancer   5. Family history of colon cancer     I connected with [name] on 05/25/2019 at 10:25 AM EDT by Jackquline Denmark and verified that I am speaking with the correct person using two identifiers.    Patient location: [facility] Provider location: office  HISTORY OF PRESENT ILLNESS:   Erin Gallagher, a 48 y.o. female, was seen for a [place] cancer genetics consultation at the request of Dr. Lindi Adie due to a personal and family history of cancer.  [name] presents to clinic today to discuss the possibility of a hereditary predisposition to cancer, genetic testing, and to further clarify her future cancer risks, as well as potential cancer risks for family members.   In 2020, at the age of 57, Ms. Dittrich was diagnosed with IDC of the left breast, ER,PR+, HER2-. The treatment plan includes surgery, Oncotype DX to determine if chemotherapy would be of benefit, adjuvant radiation and adjuvant antiestrogen therapy .     CANCER HISTORY:  Oncology History  Malignant neoplasm of upper-outer quadrant of left breast in female, estrogen receptor positive ([name])  05/20/2019 Initial Diagnosis   Routine screening mammogram detected a 1.4cm left breast mass at the 2:30 position, no axillary adenopathy. 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        Past Medical History:  Diagnosis Date  . Family history of breast cancer   . Family history of colon cancer   . Family history of lung cancer   . Family history of throat cancer   . Gall bladder disease    gall bladder removal  . Hypertension     Past  Surgical History:  Procedure Laterality Date  . CESAREAN SECTION    . CHOLECYSTECTOMY    . TUBAL LIGATION      Social History   Socioeconomic History  . Marital status: Married    Spouse name: Not on file  . Number of children: 1  . Years of education: Not on file  . Highest education level: Not on file  Occupational History  . Occupation: Therapist, music  . Financial resource strain: Not on file  . Food insecurity    Worry: Not on file    Inability: Not on file  . Transportation needs    Medical: Not on file    Non-medical: Not on file  Tobacco Use  . Smoking status: Never Smoker  . Smokeless tobacco: Never Used  Substance and Sexual Activity  . Alcohol use: Yes    Alcohol/week: 2.0 standard drinks    Types: 2 Cans of beer per week    Comment: occasionally  . Drug use: No  . Sexual activity: Yes    Birth control/protection: Other-see comments, Surgical    Comment: Tubal ligation2  Lifestyle  . Physical activity    Days per week: 3 days    Minutes per session: 50 min  . Stress: Not on file  Relationships  . Social Herbalist on phone: Not on file    Gets together: Not on file  Attends religious service: Not on file    Active member of club or organization: Not on file    Attends meetings of clubs or organizations: Not on file    Relationship status: Not on file  Other Topics Concern  . Not on file  Social History Narrative  . Not on file     FAMILY HISTORY:  We obtained a detailed, 4-generation family history.  Significant diagnoses are listed below: Family History  Problem Relation Age of Onset  . Hypertension Mother   . Heart disease Father   . Hypertension Father   . Colon cancer Father        dx 70s  . Stroke Maternal Grandmother   . Hypertension Maternal Grandmother   . Breast cancer Maternal Aunt        dx 19s  . Throat cancer Maternal Uncle   . Cancer Maternal Uncle   . Lung cancer Paternal Uncle   . Stomach cancer  Neg Hx   . Esophageal cancer Neg Hx   . Rectal cancer Neg Hx   . Liver cancer Neg Hx     [name] has one son. She has one brother, 41, who has not had cancer.  [name] mother is living at 3, no cancers. Patient had 2 maternal aunts, 10 maternal uncles. One of her aunts had breast cancer in her 41s. An uncle had throat cancer in his 28s, he did have history of smoking. Another uncle has cancer but she is unsure the type. She is unaware of any cancers in maternal cousins. Her maternal grandmother died of a stroke, maternal grandfather died in his 70s due to heart issues.  [name] father was diagnosed with colon cancer in his 2s, and passed in his 66s. Patient had 4 paternal uncles, 1 paternal aunt. One of her uncles had lung cancer in his 2s and history of smoking. No cancers in her paternal cousins she is aware of. She does not have information about paternal grandparents, they both passed when she was young.  [name] is unaware of previous family history of genetic testing for hereditary cancer risks. There is no reported Ashkenazi Jewish ancestry. There is no known consanguinity.  GENETIC COUNSELING ASSESSMENT: [name] is a 49 y.o. female with a personal and family history which is somewhat suggestive of a hereditary cancer syndrome and predisposition to cancer. We, therefore, discussed and recommended the following at today's visit.   DISCUSSION: We discussed that 5 - 10% of breast cancer is hereditary, with most cases associated with BRCA1/BRCA2 mutations.  There are other genes that can be associated with hereditary cancer syndromes. We discussed that testing is beneficial for several reasons including surgical decision-making for breast cancer, knowing how to follow individuals after completing their treatment, and understand if other family members could be at risk for cancer and allow them to undergo genetic testing.   We reviewed the characteristics, features and  inheritance patterns of hereditary cancer syndromes. We also discussed genetic testing, including the appropriate family members to test, the process of testing, insurance coverage and turn-around-time for results. We discussed the implications of a negative, positive and/or variant of uncertain significant result. In order to get genetic test results in a timely manner so that Ms. Blackerby can use these genetic test results for surgical decisions, we recommended Ms. Scogin pursue genetic testing for the [facility] Breast Cancer STAT Panel. Once complete, we recommend Ms. Boen pursue reflex genetic testing to the Common Hereditary Cancers gene  panel.   The STAT Breast cancer panel offered by [facility] includes sequencing and rearrangement analysis for the following 9 genes:  ATM, BRCA1, BRCA2, CDH1, CHEK2, PALB2, PTEN, STK11 and TP53.    The Common Hereditary Cancers Panel offered by [facility] includes sequencing and/or deletion duplication testing of the following 47 genes: APC, ATM, AXIN2, BARD1, BMPR1A, BRCA1, BRCA2, BRIP1, CDH1, CDKN2A (p14ARF), CDKN2A (p16INK4a), CKD4, CHEK2, CTNNA1, DICER1, EPCAM (Deletion/duplication testing only), GREM1 (promoter region deletion/duplication testing only), KIT, MEN1, MLH1, MSH2, MSH3, MSH6, MUTYH, NBN, NF1, NHTL1, PALB2, PDGFRA, PMS2, POLD1, POLE, PTEN, RAD50, RAD51C, RAD51D, SDHB, SDHC, SDHD, SMAD4, SMARCA4. STK11, TP53, TSC1, TSC2, and VHL.  The following genes were evaluated for sequence changes only: SDHA and HOXB13 c.251G>A variant only.  Based on Ms. Statler's personal and family history of cancer, she meets medical criteria for genetic testing. Despite that she meets criteria, she may still have an out of pocket cost.   PLAN: After considering the risks, benefits, and limitations, Ms. Fritcher provided informed consent to pursue genetic testing and the blood sample was sent to [facility] for analysis of the Breast Cancer STAT Panel + Common Hereditary Cancers Panel.  Results should be available within approximately 5-12 days' time, at which point they will be disclosed by telephone to Ms. Pirani, as will any additional recommendations warranted by these results. Ms. Cruzen will receive a summary of her genetic counseling visit and a copy of her results once available. This information will also be available in Epic.   Ms. Tarrant questions were answered to her satisfaction today. Our contact information was provided should additional questions or concerns arise. Thank you for the referral and allowing [place] to share in the care of your patient.   Faith Rogue, MS, [facility] Genetic Counselor [email or web address] Phone: [phone]  The patient was seen for a total of 20 minutes in virtual genetic counseling. [employer] Intern Amy Danelle Earthly was also present virtually and assisted with this case.   Drs. Magrinat, Lindi Adie and/or Burr Medico were available for discussion regarding this case.   _______________________________________________________________________ For Office Staff:  Number of people involved in session: 2 Was an Intern/ student involved with case: yes

## 2019-05-31 ENCOUNTER — Telehealth: Payer: Self-pay

## 2019-05-31 NOTE — Telephone Encounter (Signed)
Nutrition  Patient attended breast clinic on 9/23 and was given nutrition packet from nurse navigator.   RD called to introduce self and service at [facility].  No answer. Left message on voicemail with call back number.  Cambria Osten B. Zenia Resides, [place], [employer] Registered Dietitian [phone] (pager)

## 2019-06-02 ENCOUNTER — Telehealth: Payer: Self-pay | Admitting: *Deleted

## 2019-06-02 NOTE — Telephone Encounter (Signed)
Left vm regarding [facility] from 9.23.20. Contact information provided for questions or needs.

## 2019-06-03 ENCOUNTER — Other Ambulatory Visit: Payer: Self-pay | Admitting: General Surgery

## 2019-06-03 ENCOUNTER — Encounter: Payer: Self-pay | Admitting: *Deleted

## 2019-06-03 DIAGNOSIS — C50412 Malignant neoplasm of upper-outer quadrant of left female breast: Secondary | ICD-10-CM

## 2019-06-06 ENCOUNTER — Ambulatory Visit: Payer: Self-pay | Admitting: Licensed Clinical Social Worker

## 2019-06-06 ENCOUNTER — Encounter: Payer: Self-pay | Admitting: Licensed Clinical Social Worker

## 2019-06-06 ENCOUNTER — Telehealth: Payer: Self-pay | Admitting: Hematology and Oncology

## 2019-06-06 ENCOUNTER — Telehealth: Payer: Self-pay | Admitting: Licensed Clinical Social Worker

## 2019-06-06 DIAGNOSIS — Z801 Family history of malignant neoplasm of trachea, bronchus and lung: Secondary | ICD-10-CM

## 2019-06-06 DIAGNOSIS — C50412 Malignant neoplasm of upper-outer quadrant of left female breast: Secondary | ICD-10-CM

## 2019-06-06 DIAGNOSIS — Z800 Family history of malignant neoplasm of digestive organs: Secondary | ICD-10-CM

## 2019-06-06 DIAGNOSIS — Z803 Family history of malignant neoplasm of breast: Secondary | ICD-10-CM

## 2019-06-06 DIAGNOSIS — Z1379 Encounter for other screening for genetic and chromosomal anomalies: Secondary | ICD-10-CM

## 2019-06-06 NOTE — Progress Notes (Signed)
HPI:  Erin Gallagher was previously seen in the [place] clinic due to a personal and family history of cancer and concerns regarding a hereditary predisposition to cancer. Please refer to our prior cancer genetics clinic note for more information regarding our discussion, assessment and recommendations, at the time. Erin Gallagher recent genetic test results were disclosed to her, as were recommendations warranted by these results. These results and recommendations are discussed in more detail below.  CANCER HISTORY:  Oncology History  Malignant neoplasm of upper-outer quadrant of left breast in female, estrogen receptor positive ([place])  05/20/2019 Initial Diagnosis   Routine screening mammogram detected a 1.4cm left breast mass at the 2:30 position, no axillary adenopathy. 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    06/05/2019 Genetic Testing   Negative genetic testing. No pathogenic variants identified on the [facility] Common Hereditary Cancers Panel + STAT Breast Cancer panel.The STAT Breast cancer panel offered by [facility] includes sequencing and rearrangement analysis for the following 9 genes:  ATM, BRCA1, BRCA2, CDH1, CHEK2, PALB2, PTEN, STK11 and TP53. The Common Hereditary Cancers Panel offered by [facility] includes sequencing and/or deletion duplication testing of the following 47 genes: APC, ATM, AXIN2, BARD1, BMPR1A, BRCA1, BRCA2, BRIP1, CDH1, CDKN2A (p14ARF), CDKN2A (p16INK4a), CKD4, CHEK2, CTNNA1, DICER1, EPCAM (Deletion/duplication testing only), GREM1 (promoter region deletion/duplication testing only), KIT, MEN1, MLH1, MSH2, MSH3, MSH6, MUTYH, NBN, NF1, NHTL1, PALB2, PDGFRA, PMS2, POLD1, POLE, PTEN, RAD50, RAD51C, RAD51D, SDHB, SDHC, SDHD, SMAD4, SMARCA4. STK11, TP53, TSC1, TSC2, and VHL.  The following genes were evaluated for sequence changes only: SDHA and HOXB13 c.251G>A variant only. The report date is 06/05/2019.      FAMILY HISTORY:   We obtained a detailed, 4-generation family history.  Significant diagnoses are listed below: Family History  Problem Relation Age of Onset  . Hypertension Mother   . Heart disease Father   . Hypertension Father   . Colon cancer Father        dx 27s  . Stroke Maternal Grandmother   . Hypertension Maternal Grandmother   . Breast cancer Maternal Aunt        dx 19s  . Throat cancer Maternal Uncle   . Cancer Maternal Uncle   . Lung cancer Paternal Uncle   . Stomach cancer Neg Hx   . Esophageal cancer Neg Hx   . Rectal cancer Neg Hx   . Liver cancer Neg Hx     Erin Gallagher has one son. She has one brother, 51, who has not had cancer.  Erin Gallagher mother is living at 55, no cancers. Patient had 2 maternal aunts, 10 maternal uncles. One of her aunts had breast cancer in her 36s. An uncle had throat cancer in his 44s, he did have history of smoking. Another uncle has cancer but she is unsure the type. She is unaware of any cancers in maternal cousins. Her maternal grandmother died of a stroke, maternal grandfather died in his 59s due to heart issues.  Erin Gallagher father was diagnosed with colon cancer in his 27s, and passed in his 60s. Patient had 4 paternal uncles, 1 paternal aunt. One of her uncles had lung cancer in his 82s and history of smoking. No cancers in her paternal cousins she is aware of. She does not have information about paternal grandparents, they both passed when she was young.  Erin Gallagher is unaware of previous family history of genetic testing for hereditary cancer  risks. There is no reported Ashkenazi Jewish ancestry. There is no known consanguinity.   GENETIC TEST RESULTS: Genetic testing reported out on 06/05/2019 through the [facility] Breast Cancer STAT Panel + Common Hereditary cancer panel found no pathogenic mutations. The STAT Breast cancer panel offered by [facility] includes sequencing and rearrangement analysis for the following 9 genes:  ATM, BRCA1, BRCA2, CDH1,  CHEK2, PALB2, PTEN, STK11 and TP53.  The Common Hereditary Cancers Panel offered by [facility] includes sequencing and/or deletion duplication testing of the following 47 genes: APC, ATM, AXIN2, BARD1, BMPR1A, BRCA1, BRCA2, BRIP1, CDH1, CDKN2A (p14ARF), CDKN2A (p16INK4a), CKD4, CHEK2, CTNNA1, DICER1, EPCAM (Deletion/duplication testing only), GREM1 (promoter region deletion/duplication testing only), KIT, MEN1, MLH1, MSH2, MSH3, MSH6, MUTYH, NBN, NF1, NHTL1, PALB2, PDGFRA, PMS2, POLD1, POLE, PTEN, RAD50, RAD51C, RAD51D, SDHB, SDHC, SDHD, SMAD4, SMARCA4. STK11, TP53, TSC1, TSC2, and VHL.  The following genes were evaluated for sequence changes only: SDHA and HOXB13 c.251G>A variant only. The test report has been scanned into EPIC and is located under the Molecular Pathology section of the Results Review tab.  A portion of the result report is included below for reference.     We discussed with Erin Gallagher that because current genetic testing is not perfect, it is possible there may be a gene mutation in one of these genes that current testing cannot detect, but that chance is small.  We also discussed, that there could be another gene that has not yet been discovered, or that we have not yet tested, that is responsible for the cancer diagnoses in the family. It is also possible there is a hereditary cause for the cancer in the family that Erin Gallagher did not inherit and therefore was not identified in her testing.  Therefore, it is important to remain in touch with cancer genetics in the future so that we can continue to offer Erin Gallagher the most up to date genetic testing.   ADDITIONAL GENETIC TESTING: We discussed with Erin Gallagher that her genetic testing was fairly extensive.  If there are genes identified to increase cancer risk that can be analyzed in the future, we would be happy to discuss and coordinate this testing at that time.    CANCER SCREENING RECOMMENDATIONS: Erin Gallagher test result is considered  negative (normal).  This means that we have not identified a hereditary cause for her  personal and family history of cancer at this time. Most cancers happen by chance and this negative test suggests that her cancer may fall into this category.    While reassuring, this does not definitively rule out a hereditary predisposition to cancer. It is still possible that there could be genetic mutations that are undetectable by current technology. There could be genetic mutations in genes that have not been tested or identified to increase cancer risk.  Therefore, it is recommended she continue to follow the cancer management and screening guidelines provided by her oncology and primary healthcare provider.   An individual's cancer risk and medical management are not determined by genetic test results alone. Overall cancer risk assessment incorporates additional factors, including personal medical history, family history, and any available genetic information that may result in a personalized plan for cancer prevention and surveillance.  RECOMMENDATIONS FOR FAMILY MEMBERS:  Relatives in this family might be at some increased risk of developing cancer, over the general population risk, simply due to the family history of cancer.  We recommended female relatives in this family have a yearly mammogram beginning at age  46, or 10 years younger than the earliest onset of cancer, an annual clinical breast exam, and perform monthly breast self-exams. Female relatives in this family should also have a gynecological exam as recommended by their primary provider. All family members should have a colonoscopy by age 87, or as directed by their physicians.  FOLLOW-UP: Lastly, we discussed with Erin Gallagher that cancer genetics is a rapidly advancing field and it is possible that new genetic tests will be appropriate for her and/or her family members in the future. We encouraged her to remain in contact with cancer genetics on an  annual basis so we can update her personal and family histories and let her know of advances in cancer genetics that may benefit this family.   Our contact number was provided. Erin Gallagher questions were answered to her satisfaction, and she knows she is welcome to call us at anytime with additional questions or concerns.   Faith Rogue, MS, [facility] Genetic Counselor [email or web address] Phone: [phone]

## 2019-06-06 NOTE — Telephone Encounter (Signed)
Scheduled appt per 10/5 sch message - unable to reach pt - left message with appt date and time

## 2019-06-06 NOTE — Telephone Encounter (Signed)
Revealed negative genetic testing. This normal result is reassuring and indicates that it is unlikely Erin Gallagher's cancer is due to a hereditary cause.  It is unlikely that there is an increased risk of another cancer due to a mutation in one of these genes.  However, genetic testing is not perfect, and cannot definitively rule out a hereditary cause.  It will be important for her to keep in contact with genetics to learn if any additional testing may be needed in the future.

## 2019-06-07 ENCOUNTER — Telehealth: Payer: Self-pay | Admitting: *Deleted

## 2019-06-07 ENCOUNTER — Encounter (HOSPITAL_BASED_OUTPATIENT_CLINIC_OR_DEPARTMENT_OTHER): Payer: Self-pay | Admitting: *Deleted

## 2019-06-07 ENCOUNTER — Encounter: Payer: Self-pay | Admitting: *Deleted

## 2019-06-07 ENCOUNTER — Other Ambulatory Visit: Payer: Self-pay

## 2019-06-07 MED FILL — AMLODIPINE BESYLATE 10 MG T: 10 | 90 days supply | Qty: 90 | Fill #1

## 2019-06-07 NOTE — Telephone Encounter (Signed)
Received call from pt requesting information regarding FMLA paperwork for her upcoming surgery with [employer].  RN informed pt that her employer will need to fax the FMLA paperwork to [employer] for them to fill out regarding pt surgery and future restrictions related to surgery.  RN educated pt that the cancer center will need FMLA paperwork when pt starts to undergo radiation treatment.  Pt verbalized understanding.

## 2019-06-10 ENCOUNTER — Other Ambulatory Visit (HOSPITAL_COMMUNITY)
Admission: RE | Admit: 2019-06-10 | Discharge: 2019-06-10 | Disposition: A | Discharge status: Home / Self Care | Payer: 59 | Source: Ambulatory Visit | Attending: General Surgery | Admitting: General Surgery

## 2019-06-10 DIAGNOSIS — Z20828 Contact with and (suspected) exposure to other viral communicable diseases: Secondary | ICD-10-CM | POA: Insufficient documentation

## 2019-06-10 DIAGNOSIS — Z01812 Encounter for preprocedural laboratory examination: Secondary | ICD-10-CM | POA: Insufficient documentation

## 2019-06-13 ENCOUNTER — Encounter (HOSPITAL_BASED_OUTPATIENT_CLINIC_OR_DEPARTMENT_OTHER)
Admission: RE | Admit: 2019-06-13 | Discharge: 2019-06-13 | Disposition: A | Discharge status: Home / Self Care | Payer: 59 | Source: Ambulatory Visit | Attending: General Surgery | Admitting: General Surgery

## 2019-06-13 ENCOUNTER — Ambulatory Visit
Admission: RE | Admit: 2019-06-13 | Discharge: 2019-06-13 | Disposition: A | Discharge status: Home / Self Care | Payer: 59 | Source: Ambulatory Visit | Attending: General Surgery | Admitting: General Surgery

## 2019-06-13 ENCOUNTER — Other Ambulatory Visit: Payer: Self-pay

## 2019-06-13 ENCOUNTER — Other Ambulatory Visit: Payer: Self-pay | Admitting: General Surgery

## 2019-06-13 DIAGNOSIS — C50412 Malignant neoplasm of upper-outer quadrant of left female breast: Secondary | ICD-10-CM

## 2019-06-13 DIAGNOSIS — Z170 Estrogen receptor positive status [ER+]: Secondary | ICD-10-CM

## 2019-06-13 DIAGNOSIS — N6321 Unspecified lump in the left breast, upper outer quadrant: Secondary | ICD-10-CM | POA: Diagnosis not present

## 2019-06-13 LAB — NOVEL CORONAVIRUS, NAA (HOSP ORDER, SEND-OUT TO REF LAB; TAT 18-24 HRS): SARS-CoV-2, NAA: NOT DETECTED

## 2019-06-13 NOTE — Progress Notes (Signed)
      Enhanced Recovery after Surgery  Enhanced Recovery after Surgery is a protocol used to improve the stress on your body and your recovery after surgery.  Patient Instructions  . The night before surgery:  o No food after midnight. ONLY clear liquids after midnight  . The day of surgery (if you do NOT have diabetes):  o Drink ONE (1) Pre-Surgery Clear Ensure as directed.   o This drink was given to you during your hospital  pre-op appointment visit. o The pre-op nurse will instruct you on the time to drink the  Pre-Surgery Ensure depending on your surgery time. o Finish the drink at the designated time by the pre-op nurse.  o Nothing else to drink after completing the  Pre-Surgery Clear Ensure.  . The day of surgery (if you have diabetes): o Drink ONE (1) Gatorade 2 (G2) as directed. o This drink was given to you during your hospital  pre-op appointment visit.  o The pre-op nurse will instruct you on the time to drink the   Gatorade 2 (G2) depending on your surgery time. o Color of the Gatorade may vary. Red is not allowed. o Nothing else to drink after completing the  Gatorade 2 (G2).         If you have questions, please contact your surgeon's office. 

## 2019-06-13 NOTE — H&P (Signed)
Erin Gallagher Documented: 05/25/2019 7:33 AM Location: [facility] Patient #: [id number] DOB: 1970/10/30 Undefined / Language: [name] / Race: Black or African American Female   History of Present Illness Erin Klein MD; 05/25/2019 12:24 PM) The patient is a 48 year old female who presents with breast cancer.Pt is a 48 yo F who is referred for consultation by Dr. Miquel Dunn for a new diagnosis of left breast cancer 05/2019. She had a screening detected left breast mass. She underwent dx imaging and was seen to have a 1.4 cm mass at 2:30 o'clock. Core needle biopsy was performed and showed grade 1 invasive ductal carcinoma with DCIS. This was hormone receptor positive and her 2 negative.   She has no personal history of cancer. Her father had colon cancer. She had menarche at age 74. She is a G1P1. She is still menstruating. She is up to date with colonoscopy. She is a Designer, multimedia at [employer].   dx mammogram 05/11/2019 EXAM: DIGITAL DIAGNOSTIC BILATERAL MAMMOGRAM WITH CAD AND TOMO  ULTRASOUND BILATERAL BREAST  COMPARISON: Baseline exam.  ACR Breast Density Category b: There are scattered areas of fibroglandular density.  FINDINGS: Within the outer left breast middle depth there is a persistent irregular mass further evaluated with spot compression views. Questioned asymmetry within the outer left breast resolved with additional imaging compatible with overlapping fibroglandular tissue. Spot views of the right axilla demonstrates persistent right axillary lymph nodes.  Mammographic images were processed with CAD.  Targeted ultrasound is performed, showing a 1.4 x 1.1 x 1.3 cm irregular hypoechoic mass left breast 2:30 o'clock 7 cm from the nipple.  No left axillary adenopathy.  No discrete right axillary adenopathy.  IMPRESSION: Suspicious left breast mass 2:30 o'clock.  RECOMMENDATION: Ultrasound-guided core needle biopsy left breast  mass.  I have discussed the findings and recommendations with the patient. If applicable, a reminder letter will be sent to the patient regarding the next appointment.  BI-RADS CATEGORY 4: Suspicious  pathology 9/14 Breast, left, needle core biopsy, 2:30 o'clock - INVASIVE DUCTAL CARCINOMA. - DUCTAL CARCINOMA IN SITU. The carcinoma appears grade I. IMMUNOHISTOCHEMICAL AND MORPHOMETRIC ANALYSIS PERFORMED MANUALLY Tumor cells are NEGATIVE for Her2 (1+). Estrogen Receptor: 100%, POSITIVE, STRONG STAINING INTENSITY Progesterone Receptor: 100%, POSITIVE, STRONG STAINING INTENSITY Proliferation Marker Ki67: 10%  CMET and CBC essentially normal. 05/25/2019    Problem List/Past Medical Erin Klein, MD; 05/25/2019 12:20 PM) ESSENTIAL HYPERTENSION (I10)   Medication History Tawni Pummel, RN; 05/25/2019 7:33 AM) Medications Reconciled    Review of Systems Erin Klein MD; 05/25/2019 12:20 PM) All other systems negative  Note: positive for glasses, occasional foot swelling, arthritis, occasionally easy bruising.   Vitals Erin Klein MD; 05/25/2019 12:19 PM) 05/25/2019 12:18 PM Weight: 253.8 lb Height: 66in Body Surface Area: 2.21 m Body Mass Index: 40.96 kg/m  Temp.: 77F  Pulse: 87 (Regular)  Resp.: 18 (Unlabored)  BP: 126/82(Sitting, Left Arm, Standard)       Physical Exam Erin Klein MD; 05/25/2019 12:21 PM) General Mental Status-Alert. General Appearance-Consistent with stated age. Hydration-Well hydrated. Voice-Normal.  Head and Neck Head-normocephalic, atraumatic with no lesions or palpable masses. Trachea-midline. Thyroid Gland Characteristics - normal size and consistency.  Eye Eyeball - Bilateral-Extraocular movements intact. Sclera/Conjunctiva - Bilateral-No scleral icterus.  Chest and Lung Exam Chest and lung exam reveals -quiet, even and easy respiratory effort with no use of accessory muscles and on  auscultation, normal breath sounds, no adventitious sounds and normal vocal resonance. Inspection Chest Wall -  Normal. Back - normal.  Breast Note: breasts relatively symmetric. No palpable mass. no skin dimpling. no nipple discharge. some bruising laterally on left breast. no nipple retraction. no LAD.   Cardiovascular Cardiovascular examination reveals -normal heart sounds, regular rate and rhythm with no murmurs and normal pedal pulses bilaterally.  Abdomen Inspection Inspection of the abdomen reveals - No Hernias. Palpation/Percussion Palpation and Percussion of the abdomen reveal - Soft, Non Tender, No Rebound tenderness, No Rigidity (guarding) and No hepatosplenomegaly. Auscultation Auscultation of the abdomen reveals - Bowel sounds normal.  Neurologic Neurologic evaluation reveals -alert and oriented x 3 with no impairment of recent or remote memory. Mental Status-Normal.  Musculoskeletal Global Assessment -Note: no gross deformities.  Normal Exam - Left-Upper Extremity Strength Normal and Lower Extremity Strength Normal. Normal Exam - Right-Upper Extremity Strength Normal and Lower Extremity Strength Normal.  Lymphatic Head & Neck  General Head & Neck Lymphatics: Bilateral - Description - Normal. Axillary  General Axillary Region: Bilateral - Description - Normal. Tenderness - Non Tender. Femoral & Inguinal  Generalized Femoral & Inguinal Lymphatics: Bilateral - Description - No Generalized lymphadenopathy.    Assessment & Plan Erin Klein MD; 05/25/2019 12:25 PM)  MALIGNANT NEOPLASM OF UPPER-OUTER QUADRANT OF LEFT BREAST IN FEMALE, ESTROGEN RECEPTOR POSITIVE (C50.412) Impression: Pt has a new diagnosis of left breast cancer, cT1cN0. This is hormone positive. She is a good candidate for breast conservation. She is having genetic testing, so if she has a positive finding, will adjust our plan.  We will plan left seed localized lumpectomy with SLN  bx. This will be followed by oncotype/mammaprint, radiation, and antihormone tx.  The surgical procedure was described to the patient. I discussed the incision type and location and that we would need radiology involved on with a wire or seed marker and/or sentinel node.  The risks and benefits of the procedure were described to the patient and she wishes to proceed.  We discussed the risks bleeding, infection, damage to other structures, need for further procedures/surgeries. We discussed the risk of seroma. The patient was advised if the area in the breast in cancer, we may need to go back to surgery for additional tissue to obtain negative margins or for a lymph node biopsy. The patient was advised that these are the most common complications, but that others can occur as well. They were advised against taking aspirin or other anti-inflammatory agents/blood thinners the week before surgery.  60 min spent in evaluation, examination, counseling, and coordination of care. >50% spent in counseling.    Signed by Erin Klein, MD (05/25/2019 12:25 PM)

## 2019-06-14 ENCOUNTER — Ambulatory Visit (HOSPITAL_BASED_OUTPATIENT_CLINIC_OR_DEPARTMENT_OTHER)
Admission: RE | Admit: 2019-06-14 | Discharge: 2019-06-14 | Disposition: A | Discharge status: Home / Self Care | Payer: 59 | Attending: General Surgery | Admitting: General Surgery

## 2019-06-14 ENCOUNTER — Encounter (HOSPITAL_BASED_OUTPATIENT_CLINIC_OR_DEPARTMENT_OTHER): Payer: Self-pay

## 2019-06-14 ENCOUNTER — Ambulatory Visit (HOSPITAL_BASED_OUTPATIENT_CLINIC_OR_DEPARTMENT_OTHER): Payer: 59 | Admitting: Anesthesiology

## 2019-06-14 ENCOUNTER — Ambulatory Visit
Admission: RE | Admit: 2019-06-14 | Discharge: 2019-06-14 | Disposition: A | Discharge status: Home / Self Care | Payer: 59 | Source: Ambulatory Visit | Attending: General Surgery | Admitting: General Surgery

## 2019-06-14 ENCOUNTER — Other Ambulatory Visit: Payer: Self-pay

## 2019-06-14 ENCOUNTER — Encounter (HOSPITAL_BASED_OUTPATIENT_CLINIC_OR_DEPARTMENT_OTHER)
Admission: RE | Disposition: A | Discharge status: Home / Self Care | Payer: Self-pay | Source: Home / Self Care | Attending: General Surgery

## 2019-06-14 ENCOUNTER — Ambulatory Visit (HOSPITAL_COMMUNITY)
Admission: RE | Admit: 2019-06-14 | Discharge: 2019-06-14 | Disposition: A | Discharge status: Home / Self Care | Payer: 59 | Source: Ambulatory Visit | Attending: General Surgery | Admitting: General Surgery

## 2019-06-14 DIAGNOSIS — I10 Essential (primary) hypertension: Secondary | ICD-10-CM | POA: Diagnosis not present

## 2019-06-14 DIAGNOSIS — C50912 Malignant neoplasm of unspecified site of left female breast: Secondary | ICD-10-CM | POA: Diagnosis not present

## 2019-06-14 DIAGNOSIS — Z6841 Body Mass Index (BMI) 40.0 and over, adult: Secondary | ICD-10-CM | POA: Insufficient documentation

## 2019-06-14 DIAGNOSIS — Z170 Estrogen receptor positive status [ER+]: Secondary | ICD-10-CM

## 2019-06-14 DIAGNOSIS — G8918 Other acute postprocedural pain: Secondary | ICD-10-CM | POA: Diagnosis not present

## 2019-06-14 DIAGNOSIS — C50412 Malignant neoplasm of upper-outer quadrant of left female breast: Secondary | ICD-10-CM | POA: Insufficient documentation

## 2019-06-14 DIAGNOSIS — M1990 Unspecified osteoarthritis, unspecified site: Secondary | ICD-10-CM | POA: Insufficient documentation

## 2019-06-14 DIAGNOSIS — Z803 Family history of malignant neoplasm of breast: Secondary | ICD-10-CM | POA: Insufficient documentation

## 2019-06-14 HISTORY — DX: Unspecified osteoarthritis, unspecified site: M19.90

## 2019-06-14 HISTORY — PX: BREAST LUMPECTOMY WITH RADIOACTIVE SEED AND SENTINEL LYMPH NODE BIOPSY: SHX6550

## 2019-06-14 HISTORY — PX: BREAST LUMPECTOMY: SHX2

## 2019-06-14 SURGERY — BREAST LUMPECTOMY WITH RADIOACTIVE SEED AND SENTINEL LYMPH NODE BIOPSY
Anesthesia: General | Site: Breast | Laterality: Left

## 2019-06-14 MED ORDER — PROPOFOL 10 MG/ML IV BOLUS
INTRAVENOUS | Status: DC | PRN
  Administered 2019-06-14: 200 mg via INTRAVENOUS

## 2019-06-14 MED ORDER — FENTANYL CITRATE (PF) 100 MCG/2ML IJ SOLN
50.0000 ug | INTRAMUSCULAR | Status: AC | PRN
  Administered 2019-06-14 (×4): 50 ug via INTRAVENOUS

## 2019-06-14 MED ORDER — DIPHENHYDRAMINE HCL 50 MG/ML IJ SOLN
INTRAMUSCULAR | Status: DC | PRN
  Administered 2019-06-14: 12.5 mg via INTRAVENOUS

## 2019-06-14 MED ORDER — GABAPENTIN 300 MG PO CAPS
ORAL_CAPSULE | ORAL | Status: AC
  Filled 2019-06-14: qty 1

## 2019-06-14 MED ORDER — LIDOCAINE HCL (CARDIAC) PF 100 MG/5ML IV SOSY
PREFILLED_SYRINGE | INTRAVENOUS | Status: DC | PRN
  Administered 2019-06-14: 100 mg via INTRAVENOUS

## 2019-06-14 MED ORDER — LACTATED RINGERS IV SOLN
INTRAVENOUS | Status: DC
  Administered 2019-06-14 (×2): via INTRAVENOUS

## 2019-06-14 MED ORDER — SUCCINYLCHOLINE CHLORIDE 200 MG/10ML IV SOSY
PREFILLED_SYRINGE | INTRAVENOUS | Status: AC
  Filled 2019-06-14: qty 10

## 2019-06-14 MED ORDER — ROCURONIUM BROMIDE 10 MG/ML (PF) SYRINGE
PREFILLED_SYRINGE | INTRAVENOUS | Status: AC
  Filled 2019-06-14: qty 10

## 2019-06-14 MED ORDER — OXYCODONE HCL 5 MG PO TABS: 5.0000 mg | ORAL_TABLET | Freq: Four times a day (QID) | ORAL | 0 refills | Status: DC | PRN

## 2019-06-14 MED ORDER — PROMETHAZINE HCL 25 MG/ML IJ SOLN
INTRAMUSCULAR | Status: AC
  Filled 2019-06-14: qty 1

## 2019-06-14 MED ORDER — FENTANYL CITRATE (PF) 100 MCG/2ML IJ SOLN
INTRAMUSCULAR | Status: AC
  Filled 2019-06-14: qty 2

## 2019-06-14 MED ORDER — LIDOCAINE 2% (20 MG/ML) 5 ML SYRINGE
INTRAMUSCULAR | Status: AC
  Filled 2019-06-14: qty 5

## 2019-06-14 MED ORDER — ONDANSETRON HCL 4 MG/2ML IJ SOLN
INTRAMUSCULAR | Status: AC
  Filled 2019-06-14: qty 2

## 2019-06-14 MED ORDER — CEFAZOLIN SODIUM-DEXTROSE 2-4 GM/100ML-% IV SOLN
INTRAVENOUS | Status: AC
  Filled 2019-06-14: qty 100

## 2019-06-14 MED ORDER — FENTANYL CITRATE (PF) 100 MCG/2ML IJ SOLN
25.0000 ug | INTRAMUSCULAR | Status: DC | PRN
  Administered 2019-06-14: 50 ug via INTRAVENOUS

## 2019-06-14 MED ORDER — CEFAZOLIN SODIUM-DEXTROSE 2-4 GM/100ML-% IV SOLN
2.0000 g | INTRAVENOUS | Status: AC
  Administered 2019-06-14: 12:00:00 2 g via INTRAVENOUS

## 2019-06-14 MED ORDER — DEXAMETHASONE SODIUM PHOSPHATE 10 MG/ML IJ SOLN
INTRAMUSCULAR | Status: AC
  Filled 2019-06-14: qty 1

## 2019-06-14 MED ORDER — BUPIVACAINE-EPINEPHRINE (PF) 0.5% -1:200000 IJ SOLN
INTRAMUSCULAR | Status: DC | PRN
  Administered 2019-06-14: 30 mL via PERINEURAL

## 2019-06-14 MED ORDER — ACETAMINOPHEN 500 MG PO TABS
1000.0000 mg | ORAL_TABLET | ORAL | Status: AC
  Administered 2019-06-14: 11:00:00 1000 mg via ORAL

## 2019-06-14 MED ORDER — ACETAMINOPHEN 500 MG PO TABS
ORAL_TABLET | ORAL | Status: AC
  Filled 2019-06-14: qty 2

## 2019-06-14 MED ORDER — MIDAZOLAM HCL 2 MG/2ML IJ SOLN
INTRAMUSCULAR | Status: AC
  Filled 2019-06-14: qty 2

## 2019-06-14 MED ORDER — TECHNETIUM TC 99M SULFUR COLLOID FILTERED
1.0000 | Freq: Once | INTRAVENOUS | Status: AC | PRN
  Administered 2019-06-14: 1 via INTRADERMAL

## 2019-06-14 MED ORDER — DIPHENHYDRAMINE HCL 50 MG/ML IJ SOLN
INTRAMUSCULAR | Status: AC
  Filled 2019-06-14: qty 1

## 2019-06-14 MED ORDER — CHLORHEXIDINE GLUCONATE CLOTH 2 % EX PADS: 6.0000 | MEDICATED_PAD | Freq: Once | CUTANEOUS | Status: DC

## 2019-06-14 MED ORDER — MIDAZOLAM HCL 2 MG/2ML IJ SOLN
1.0000 mg | INTRAMUSCULAR | Status: DC | PRN
  Administered 2019-06-14: 2 mg via INTRAVENOUS
  Administered 2019-06-14 (×2): 1 mg via INTRAVENOUS

## 2019-06-14 MED ORDER — GABAPENTIN 300 MG PO CAPS
300.0000 mg | ORAL_CAPSULE | ORAL | Status: AC
  Administered 2019-06-14: 300 mg via ORAL

## 2019-06-14 MED ORDER — SCOPOLAMINE 1 MG/3DAYS TD PT72
MEDICATED_PATCH | TRANSDERMAL | Status: AC
  Filled 2019-06-14: qty 1

## 2019-06-14 MED ORDER — PROMETHAZINE HCL 25 MG/ML IJ SOLN
6.2500 mg | INTRAMUSCULAR | Status: DC | PRN
  Administered 2019-06-14: 6.25 mg via INTRAVENOUS

## 2019-06-14 MED ORDER — SCOPOLAMINE 1 MG/3DAYS TD PT72
1.0000 | MEDICATED_PATCH | Freq: Once | TRANSDERMAL | Status: AC
  Administered 2019-06-14: 12:00:00 1 via TRANSDERMAL

## 2019-06-14 MED ORDER — CLONIDINE HCL (ANALGESIA) 100 MCG/ML EP SOLN
EPIDURAL | Status: DC | PRN
  Administered 2019-06-14: 100 ug

## 2019-06-14 MED ORDER — DEXAMETHASONE SODIUM PHOSPHATE 4 MG/ML IJ SOLN
INTRAMUSCULAR | Status: DC | PRN
  Administered 2019-06-14: 10 mg via INTRAVENOUS

## 2019-06-14 MED ORDER — EPHEDRINE SULFATE 50 MG/ML IJ SOLN
INTRAMUSCULAR | Status: DC | PRN
  Administered 2019-06-14: 10 mg via INTRAVENOUS

## 2019-06-14 MED ORDER — LIDOCAINE-EPINEPHRINE (PF) 1 %-1:200000 IJ SOLN
INTRAMUSCULAR | Status: DC | PRN
  Administered 2019-06-14: 20 mL via INTRAMUSCULAR

## 2019-06-14 MED ORDER — PHENYLEPHRINE 40 MCG/ML (10ML) SYRINGE FOR IV PUSH (FOR BLOOD PRESSURE SUPPORT)
PREFILLED_SYRINGE | INTRAVENOUS | Status: AC
  Filled 2019-06-14: qty 10

## 2019-06-14 MED FILL — oxyCODONE HCL 5 MG TABS: 5 | 4 days supply | Qty: 15 | Fill #0

## 2019-06-14 SURGICAL SUPPLY — 64 items
ADH SKN CLS APL DERMABOND .7 (GAUZE/BANDAGES/DRESSINGS) ×1
APL PRP STRL LF DISP 70% ISPRP (MISCELLANEOUS) ×1
BINDER BREAST XLRG (GAUZE/BANDAGES/DRESSINGS) IMPLANT
BINDER BREAST XXLRG (GAUZE/BANDAGES/DRESSINGS) ×2 IMPLANT
BLADE SURG 10 STRL SS (BLADE) ×3 IMPLANT
BLADE SURG 15 STRL LF DISP TIS (BLADE) ×1 IMPLANT
BLADE SURG 15 STRL SS (BLADE) ×3
BNDG COHESIVE 4X5 TAN STRL (GAUZE/BANDAGES/DRESSINGS) ×3 IMPLANT
CANISTER SUC SOCK COL 7IN (MISCELLANEOUS) IMPLANT
CANISTER SUCT 1200ML W/VALVE (MISCELLANEOUS) ×3 IMPLANT
CHLORAPREP W/TINT 26 (MISCELLANEOUS) ×3 IMPLANT
CLIP VESOCCLUDE LG 6/CT (CLIP) ×3 IMPLANT
CLIP VESOCCLUDE MED 6/CT (CLIP) ×6 IMPLANT
COVER MAYO STAND REUSABLE (DRAPES) ×6 IMPLANT
COVER PROBE W GEL 5X96 (DRAPES) ×3 IMPLANT
DECANTER SPIKE VIAL GLASS SM (MISCELLANEOUS) IMPLANT
DERMABOND ADVANCED (GAUZE/BANDAGES/DRESSINGS) ×2
DERMABOND ADVANCED .7 DNX12 (GAUZE/BANDAGES/DRESSINGS) ×1 IMPLANT
DRAPE UTILITY XL STRL (DRAPES) ×3 IMPLANT
DRSG PAD ABDOMINAL 8X10 ST (GAUZE/BANDAGES/DRESSINGS) ×3 IMPLANT
ELECT BLADE 4.0 EZ CLEAN MEGAD (MISCELLANEOUS) ×3
ELECT COATED BLADE 2.86 ST (ELECTRODE) ×3 IMPLANT
ELECT REM PT RETURN 9FT ADLT (ELECTROSURGICAL) ×3
ELECTRODE BLDE 4.0 EZ CLN MEGD (MISCELLANEOUS) IMPLANT
ELECTRODE REM PT RTRN 9FT ADLT (ELECTROSURGICAL) ×1 IMPLANT
GAUZE SPONGE 4X4 12PLY STRL LF (GAUZE/BANDAGES/DRESSINGS) ×3 IMPLANT
GLOVE BIO SURGEON STRL SZ 6 (GLOVE) ×3 IMPLANT
GLOVE BIO SURGEON STRL SZ 6.5 (GLOVE) ×2 IMPLANT
GLOVE BIO SURGEONS STRL SZ 6.5 (GLOVE) ×2
GLOVE BIOGEL PI IND STRL 6.5 (GLOVE) ×1 IMPLANT
GLOVE BIOGEL PI INDICATOR 6.5 (GLOVE) ×8
GOWN STRL REUS W/ TWL LRG LVL3 (GOWN DISPOSABLE) ×1 IMPLANT
GOWN STRL REUS W/TWL 2XL LVL3 (GOWN DISPOSABLE) ×3 IMPLANT
GOWN STRL REUS W/TWL LRG LVL3 (GOWN DISPOSABLE) ×3
KIT MARKER MARGIN INK (KITS) ×3 IMPLANT
LIGHT WAVEGUIDE WIDE FLAT (MISCELLANEOUS) ×2 IMPLANT
NDL HYPO 25X1 1.5 SAFETY (NEEDLE) ×1 IMPLANT
NDL SAFETY ECLIPSE 18X1.5 (NEEDLE) ×1 IMPLANT
NEEDLE HYPO 18GX1.5 SHARP (NEEDLE) ×3
NEEDLE HYPO 25X1 1.5 SAFETY (NEEDLE) ×3 IMPLANT
NS IRRIG 1000ML POUR BTL (IV SOLUTION) ×3 IMPLANT
PACK BASIN DAY SURGERY FS (CUSTOM PROCEDURE TRAY) ×3 IMPLANT
PACK UNIVERSAL I (CUSTOM PROCEDURE TRAY) ×3 IMPLANT
PENCIL BUTTON HOLSTER BLD 10FT (ELECTRODE) ×3 IMPLANT
SLEEVE SCD COMPRESS KNEE MED (MISCELLANEOUS) ×3 IMPLANT
SPONGE LAP 18X18 RF (DISPOSABLE) ×6 IMPLANT
STAPLER VISISTAT 35W (STAPLE) IMPLANT
STOCKINETTE IMPERVIOUS LG (DRAPES) ×3 IMPLANT
SUT ETHILON 2 0 FS 18 (SUTURE) IMPLANT
SUT MNCRL AB 4-0 PS2 18 (SUTURE) ×3 IMPLANT
SUT MON AB 5-0 PS2 18 (SUTURE) IMPLANT
SUT SILK 2 0 SH (SUTURE) IMPLANT
SUT VIC AB 2-0 SH 27 (SUTURE) ×3
SUT VIC AB 2-0 SH 27XBRD (SUTURE) ×1 IMPLANT
SUT VIC AB 3-0 SH 27 (SUTURE) ×3
SUT VIC AB 3-0 SH 27X BRD (SUTURE) ×1 IMPLANT
SUT VICRYL 3-0 CR8 SH (SUTURE) ×3 IMPLANT
SYR BULB 3OZ (MISCELLANEOUS) ×3 IMPLANT
SYR CONTROL 10ML LL (SYRINGE) ×3 IMPLANT
TOWEL GREEN STERILE FF (TOWEL DISPOSABLE) ×3 IMPLANT
TRAY FAXITRON CT DISP (TRAY / TRAY PROCEDURE) ×3 IMPLANT
TUBE CONNECTING 20'X1/4 (TUBING) ×1
TUBE CONNECTING 20X1/4 (TUBING) ×2 IMPLANT
YANKAUER SUCT BULB TIP NO VENT (SUCTIONS) ×3 IMPLANT

## 2019-06-14 NOTE — Discharge Instructions (Addendum)
[place] Office Phone Number [phone]  BREAST BIOPSY/ PARTIAL MASTECTOMY: POST OP INSTRUCTIONS  Always review your discharge instruction sheet given to you by the facility where your surgery was performed.  IF YOU HAVE DISABILITY OR FAMILY LEAVE FORMS, YOU MUST BRING THEM TO THE OFFICE FOR PROCESSING.  DO NOT GIVE THEM TO YOUR DOCTOR.  1. A prescription for pain medication may be given to you upon discharge.  Take your pain medication as prescribed, if needed.  If narcotic pain medicine is not needed, then you may take acetaminophen (Tylenol) or ibuprofen (Advil) as needed. 2. Take your usually prescribed medications unless otherwise directed 3. If you need a refill on your pain medication, please contact your pharmacy.  They will contact our office to request authorization.  Prescriptions will not be filled after 5pm or on week-ends. 4. You should eat very light the first 24 hours after surgery, such as soup, crackers, pudding, etc.  Resume your normal diet the day after surgery. 5. Most patients will experience some swelling and bruising in the breast.  Ice packs and a good support bra will help.  Swelling and bruising can take several days to resolve.  6. It is common to experience some constipation if taking pain medication after surgery.  Increasing fluid intake and taking a stool softener will usually help or prevent this problem from occurring.  A mild laxative (Milk of Magnesia or Miralax) should be taken according to package directions if there are no bowel movements after 48 hours. 7. Unless discharge instructions indicate otherwise, you may remove your bandages 48 hours after surgery, and you may shower at that time.  You may have steri-strips (small skin tapes) in place directly over the incision.  These strips should be left on the skin for 7-10 days.   Any sutures or staples will be removed at the office during your follow-up visit. 8. ACTIVITIES:  You may resume  regular daily activities (gradually increasing) beginning the next day.  Wearing a good support bra or sports bra (or the breast binder) minimizes pain and swelling.  You may have sexual intercourse when it is comfortable. a. You may drive when you no longer are taking prescription pain medication, you can comfortably wear a seatbelt, and you can safely maneuver your car and apply brakes. b. RETURN TO WORK:  __________1 week_______________ 9. You should see your doctor in the office for a follow-up appointment approximately two weeks after your surgery.  Your doctors nurse will typically make your follow-up appointment when she calls you with your pathology report.  Expect your pathology report 2-3 business days after your surgery.  You may call to check if you do not hear from [place] after three days.   WHEN TO CALL YOUR DOCTOR: 1. Fever over 101.0 2. Nausea and/or vomiting. 3. Extreme swelling or bruising. 4. Continued bleeding from incision. 5. Increased pain, redness, or drainage from the incision.  The clinic staff is available to answer your questions during regular business hours.  Please dont hesitate to call and ask to speak to one of the nurses for clinical concerns.  If you have a medical emergency, go to the nearest emergency room or call 911.  A surgeon from [facility] is always on call at the hospital.  For further questions, please visit [email or web address]    May take next dose of Tylenol at 5:30 PM on 06/14/2019 if needed.   Post Anesthesia Home Care Instructions  Activity: Get plenty of rest  for the remainder of the day. A responsible individual must stay with you for 24 hours following the procedure.  For the next 24 hours, DO NOT: -Drive a car -Paediatric nurse -Drink alcoholic beverages -Take any medication unless instructed by your physician -Make any legal decisions or sign important papers.  Meals: Start with liquid foods such as gelatin or  soup. Progress to regular foods as tolerated. Avoid greasy, spicy, heavy foods. If nausea and/or vomiting occur, drink only clear liquids until the nausea and/or vomiting subsides. Call your physician if vomiting continues.  Special Instructions/Symptoms: Your throat may feel dry or sore from the anesthesia or the breathing tube placed in your throat during surgery. If this causes discomfort, gargle with warm salt water. The discomfort should disappear within 24 hours.  If you had a scopolamine patch placed behind your ear for the management of post- operative nausea and/or vomiting:  1. The medication in the patch is effective for 72 hours, after which it should be removed.  Wrap patch in a tissue and discard in the trash. Wash hands thoroughly with soap and water. 2. You may remove the patch earlier than 72 hours if you experience unpleasant side effects which may include dry mouth, dizziness or visual disturbances. 3. Avoid touching the patch. Wash your hands with soap and water after contact with the patch.

## 2019-06-14 NOTE — Anesthesia Procedure Notes (Signed)
Procedure Name: LMA Insertion Date/Time: 06/14/2019 12:16 PM Performed by: Willa Frater, CRNA Pre-anesthesia Checklist: Patient identified, Emergency Drugs available, Suction available and Patient being monitored Patient Re-evaluated:Patient Re-evaluated prior to induction Oxygen Delivery Method: Circle system utilized Preoxygenation: Pre-oxygenation with 100% oxygen Induction Type: IV induction Ventilation: Mask ventilation without difficulty LMA: LMA inserted LMA Size: 4.0 Number of attempts: 1 Airway Equipment and Method: Bite block Placement Confirmation: positive ETCO2 Tube secured with: Tape Dental Injury: Teeth and Oropharynx as per pre-operative assessment

## 2019-06-14 NOTE — Progress Notes (Signed)
nuc med injection performed by nuc med staff.  VS stable. Additional dose of fentanyl given. Patient tolerated well.

## 2019-06-14 NOTE — Anesthesia Procedure Notes (Signed)
Anesthesia Regional Block: Pectoralis block   Pre-Anesthetic Checklist: ,, timeout performed, Correct Patient, Correct Site, Correct Laterality, Correct Procedure, Correct Position, site marked, Risks and benefits discussed,  Surgical consent,  Pre-op evaluation,  At surgeon's request and post-op pain management  Laterality: Left  Prep: chloraprep       Needles:  Injection technique: Single-shot  Needle Type: Echogenic Needle     Needle Length: 9cm  Needle Gauge: 21     Additional Needles:   Procedures:,,,, ultrasound used (permanent image in chart),,,,  Narrative:  Start time: 06/14/2019 11:45 AM End time: 06/14/2019 11:52 AM Injection made incrementally with aspirations every 5 mL.  Performed by: Personally  Anesthesiologist: Catalina Gravel, MD  Additional Notes: No pain on injection. No increased resistance to injection. Injection made in 5cc increments.  Good needle visualization.  Patient tolerated procedure well.

## 2019-06-14 NOTE — Interval H&P Note (Signed)
History and Physical Interval Note:  06/14/2019 11:57 AM  Erin Gallagher  has presented today for surgery, with the diagnosis of LEFT BREAST CANCER.  The various methods of treatment have been discussed with the patient and family. After consideration of risks, benefits and other options for treatment, the patient has consented to  Procedure(s): LEFT BREAST LUMPECTOMY WITH RADIOACTIVE SEED AND SENTINEL LYMPH NODE BIOPSY (Left) as a surgical intervention.  The patient's history has been reviewed, patient examined, no change in status, stable for surgery.  I have reviewed the patient's chart and labs.  Questions were answered to the patient's satisfaction.     [name]

## 2019-06-14 NOTE — Op Note (Signed)
Left Breast Radioactive seed localized lumpectomy and sentinel lymph node biopsy  Indications: This patient presents with history of left breast cancer, invasive ductal carcinoma with DCIS, upper outer quadrant, cT1cN0M0, grade 1, +/+/-  Pre-operative Diagnosis: left breast cancer  Post-operative Diagnosis: Same  Surgeon: [name]   Anesthesia: General endotracheal anesthesia  ASA Class: 3  Procedure Details  The patient was seen in the Holding Room. The risks, benefits, complications, treatment options, and expected outcomes were discussed with the patient. The possibilities of bleeding, infection, the need for additional procedures, failure to diagnose a condition, and creating a complication requiring transfusion or operation were discussed with the patient. The patient concurred with the proposed plan, giving informed consent.  The site of surgery properly noted/marked. The patient was taken to Operating Room # 8, identified, and the procedure verified as left Breast seed localized Lumpectomy with sentinel node biopsy. A Time Out was held and the above information confirmed.  The left arm, breast, and chest were prepped and draped in standard fashion. The lumpectomy was performed by creating a lateral circumareolar incision near the previously placed radioactive seed.  Dissection was carried down to around the point of maximum signal intensity. The cautery was used to perform the dissection.  Hemostasis was achieved with cautery. The edges of the cavity were marked with large clips, with one each medial, lateral, inferior and superior, and two clips posteriorly.   The specimen was inked with the margin marker paint kit.    Specimen radiography confirmed inclusion of the mammographic lesion, the clip, and the seed.  Additional lateral, posterior and superior margins were taken.  The background signal in the breast was zero.  The wound was irrigated and closed with 3-0 vicryl in layers and 4-0  monocryl subcuticular suture.    Using a hand-held gamma probe, left axillary sentinel nodes were identified transcutaneously.  An oblique incision was created below the axillary hairline.  Dissection was carried through the clavipectoral fascia.  Three deep level 2 axillary sentinel nodes were removed.  Counts per second were 230, 110, and 70.    The background count was 11 cps.  The wound was irrigated.  Hemostasis was achieved with cautery.  The axillary incision was closed with a 3-0 vicryl deep dermal interrupted sutures and a 4-0 monocryl subcuticular closure.    Sterile dressings were applied. At the end of the operation, all sponge, instrument, and needle counts were correct.  Findings: grossly clear surgical margins and no adenopathy, posterior margin is pectoralis  Estimated Blood Loss:  min         Specimens: left breast lumpectomy with seed and three deep left axillary sentinel lymph nodes.             Complications:  None; patient tolerated the procedure well.         Disposition: PACU - hemodynamically stable.         Condition: stable

## 2019-06-14 NOTE — Progress Notes (Signed)
Assisted Dr. Turk with left, ultrasound guided, pectoralis block. Side rails up, monitors on throughout procedure. See vital signs in flow sheet. Tolerated Procedure well. 

## 2019-06-14 NOTE — Transfer of Care (Signed)
Immediate Anesthesia Transfer of Care Note  Patient: Erin Gallagher  Procedure(s) Performed: LEFT BREAST LUMPECTOMY WITH RADIOACTIVE SEED AND SENTINEL LYMPH NODE BIOPSY (Left Breast)  Patient Location: PACU  Anesthesia Type:GA combined with regional for post-op pain  Level of Consciousness: sedated  Airway & Oxygen Therapy: Patient Spontanous Breathing and Patient connected to face mask oxygen  Post-op Assessment: Report given to RN and Post -op Vital signs reviewed and stable  Post vital signs: Reviewed and stable  Last Vitals:  Vitals Value Taken Time  BP    Temp    Pulse    Resp 19 06/14/19 1350  SpO2    Vitals shown include unvalidated device data.  Last Pain:  Vitals:   06/14/19 1123  TempSrc: Oral  PainSc: 0-No pain         Complications: No apparent anesthesia complications

## 2019-06-14 NOTE — Anesthesia Preprocedure Evaluation (Addendum)
Anesthesia Evaluation  Patient identified by MRN, date of birth, ID band Patient awake    Reviewed: Allergy & Precautions, NPO status , Patient's Chart, lab work & pertinent test results  Airway Mallampati: II  TM Distance: >3 FB Neck ROM: Full    Dental  (+) Teeth Intact, Dental Advisory Given   Pulmonary neg pulmonary ROS,    Pulmonary exam normal breath sounds clear to auscultation       Cardiovascular hypertension, Pt. on medications Normal cardiovascular exam Rhythm:Regular Rate:Normal     Neuro/Psych negative neurological ROS  negative psych ROS   GI/Hepatic negative GI ROS, Neg liver ROS,   Endo/Other  Morbid obesity  Renal/GU negative Renal ROS     Musculoskeletal  (+) Arthritis ,   Abdominal   Peds  Hematology negative hematology ROS (+)   Anesthesia Other Findings Day of surgery medications reviewed with the patient.  LEFT BREAST CANCER  Reproductive/Obstetrics negative OB ROS                            Anesthesia Physical Anesthesia Plan  ASA: III  Anesthesia Plan: General   Post-op Pain Management:  Regional for Post-op pain   Induction: Intravenous  PONV Risk Score and Plan: 3 and Midazolam, Diphenhydramine, Ondansetron and Dexamethasone  Airway Management Planned: LMA  Additional Equipment:   Intra-op Plan:   Post-operative Plan: Extubation in OR  Informed Consent: I have reviewed the patients History and Physical, chart, labs and discussed the procedure including the risks, benefits and alternatives for the proposed anesthesia with the patient or authorized representative who has indicated his/her understanding and acceptance.     Dental advisory given  Plan Discussed with: CRNA  Anesthesia Plan Comments:         Anesthesia Quick Evaluation

## 2019-06-15 ENCOUNTER — Encounter (HOSPITAL_BASED_OUTPATIENT_CLINIC_OR_DEPARTMENT_OTHER): Payer: Self-pay | Admitting: General Surgery

## 2019-06-16 LAB — SURGICAL PATHOLOGY

## 2019-06-16 NOTE — Anesthesia Postprocedure Evaluation (Signed)
Anesthesia Post Note  Patient: Erin Gallagher  Procedure(s) Performed: LEFT BREAST LUMPECTOMY WITH RADIOACTIVE SEED AND SENTINEL LYMPH NODE BIOPSY (Left Breast)     Patient location during evaluation: PACU Anesthesia Type: General Level of consciousness: awake and alert Pain management: pain level controlled Vital Signs Assessment: post-procedure vital signs reviewed and stable Respiratory status: spontaneous breathing, nonlabored ventilation and respiratory function stable Cardiovascular status: blood pressure returned to baseline and stable Postop Assessment: no apparent nausea or vomiting Anesthetic complications: no    Last Vitals:  Vitals:   06/14/19 1515 06/14/19 1545  BP: 123/78 127/82  Pulse: 74 77  Resp: 17 16  Temp:  36.6 C  SpO2: 96% 100%    Last Pain:  Vitals:   06/14/19 1545  TempSrc:   PainSc: 0-No pain                 Catalina Gravel

## 2019-06-17 ENCOUNTER — Telehealth: Payer: Self-pay | Admitting: *Deleted

## 2019-06-17 NOTE — Progress Notes (Signed)
Please let patient know margins and lymph nodes are negative.

## 2019-06-17 NOTE — Telephone Encounter (Signed)
Ordered oncotype per Dr. Gudena. Faxed requisition to pathology. °

## 2019-06-21 NOTE — Progress Notes (Signed)
Patient Care Team: Rutherford Guys, MD as PCP - General (Family Medicine) Mauro Kaufmann, RN as Oncology Nurse Navigator Rockwell Germany, RN as Oncology Nurse Navigator [name], MD as Consulting Physician (General Surgery) Nicholas Lose, MD as Consulting Physician (Hematology and Oncology) Kyung Rudd, MD as Consulting Physician (Radiation Oncology)  DIAGNOSIS:    ICD-10-CM   1. Malignant neoplasm of upper-outer quadrant of left breast in female, estrogen receptor positive ([place])  C50.412    Z17.0     SUMMARY OF ONCOLOGIC HISTORY: Oncology History  Malignant neoplasm of upper-outer quadrant of left breast in female, estrogen receptor positive ([place])  05/20/2019 Initial Diagnosis   Routine screening mammogram detected a 1.4cm left breast mass at the 2:30 position, no axillary adenopathy. 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    06/05/2019 Genetic Testing   Negative genetic testing. No pathogenic variants identified on the [facility] Common Hereditary Cancers Panel + STAT Breast Cancer panel.The STAT Breast cancer panel offered by [facility] includes sequencing and rearrangement analysis for the following 9 genes:  ATM, BRCA1, BRCA2, CDH1, CHEK2, PALB2, PTEN, STK11 and TP53. The Common Hereditary Cancers Panel offered by [facility] includes sequencing and/or deletion duplication testing of the following 47 genes: APC, ATM, AXIN2, BARD1, BMPR1A, BRCA1, BRCA2, BRIP1, CDH1, CDKN2A (p14ARF), CDKN2A (p16INK4a), CKD4, CHEK2, CTNNA1, DICER1, EPCAM (Deletion/duplication testing only), GREM1 (promoter region deletion/duplication testing only), KIT, MEN1, MLH1, MSH2, MSH3, MSH6, MUTYH, NBN, NF1, NHTL1, PALB2, PDGFRA, PMS2, POLD1, POLE, PTEN, RAD50, RAD51C, RAD51D, SDHB, SDHC, SDHD, SMAD4, SMARCA4. STK11, TP53, TSC1, TSC2, and VHL.  The following genes were evaluated for sequence changes only: SDHA and HOXB13 c.251G>A variant only. The report date is 06/05/2019.   06/14/2019 Surgery   Left lumpectomy [facility]): IDC, grade 2, with high grade DCIS, 1.7cm, clear margins, 2 left axillary lymph nodes negative for carcinoma.      CHIEF COMPLIANT: Follow-up s/p lumpectomy to review pathology   INTERVAL HISTORY: Erin Gallagher is a 48 y.o. with above-mentioned history of left breast cancer. She underwent a left lumpectomy on 06/14/19 with Dr. Barry Dienes for which pathology showed invasive ductal carcinoma, grade 2, with high grade DCIS, 1.7cm, clear margins, 2 left axillary lymph nodes negative for carcinoma. She presents to the clinic today to review the pathology report and discuss further treatment.   REVIEW OF SYSTEMS:   Constitutional: Denies fevers, chills or abnormal weight loss Eyes: Denies blurriness of vision Ears, nose, mouth, throat, and face: Denies mucositis or sore throat Respiratory: Denies cough, dyspnea or wheezes Cardiovascular: Denies palpitation, chest discomfort Gastrointestinal: Denies nausea, heartburn or change in bowel habits Skin: Denies abnormal skin rashes Lymphatics: Denies new lymphadenopathy or easy bruising Neurological: Denies numbness, tingling or new weaknesses Behavioral/Psych: Mood is stable, no new changes  Extremities: No lower extremity edema Breast: denies any pain or lumps or nodules in either breasts All other systems were reviewed with the patient and are negative.  I have reviewed the past medical history, past surgical history, social history and family history with the patient and they are unchanged from previous note.  ALLERGIES:  is allergic to other.  MEDICATIONS:  Current Outpatient Medications  Medication Sig Dispense Refill  . amLODipine (NORVASC) 10 MG tablet Take 1 tablet (10 mg total) by mouth daily. 90 tablet 1  . cyclobenzaprine (FLEXERIL) 5 MG tablet 1 pill by mouth up to every 8 hours as needed. Start with one pill by mouth each bedtime  as needed due to sedation 15 tablet 0  . losartan (COZAAR)  50 MG tablet Take 1 tablet (50 mg total) by mouth daily. 90 tablet 1  . oxyCODONE (OXY IR/ROXICODONE) 5 MG immediate release tablet Take 1 tablet (5 mg total) by mouth every 6 (six) hours as needed for severe pain. 15 tablet 0   No current facility-administered medications for this visit.     PHYSICAL EXAMINATION: ECOG PERFORMANCE STATUS: 1 - Symptomatic but completely ambulatory  Vitals:   06/22/19 1433  BP: 132/71  Pulse: 90  Resp: 17  Temp: 98.1 F (36.7 C)  SpO2: 99%   Filed Weights   06/22/19 1433  Weight: 255 lb 1.6 oz (115.7 kg)    GENERAL: alert, no distress and comfortable SKIN: skin color, texture, turgor are normal, no rashes or significant lesions EYES: normal, Conjunctiva are pink and non-injected, sclera clear OROPHARYNX: no exudate, no erythema and lips, buccal mucosa, and tongue normal  NECK: supple, thyroid normal size, non-tender, without nodularity LYMPH: no palpable lymphadenopathy in the cervical, axillary or inguinal LUNGS: clear to auscultation and percussion with normal breathing effort HEART: regular rate & rhythm and no murmurs and no lower extremity edema ABDOMEN: abdomen soft, non-tender and normal bowel sounds MUSCULOSKELETAL: no cyanosis of digits and no clubbing  NEURO: alert & oriented x 3 with fluent speech, no focal motor/sensory deficits EXTREMITIES: No lower extremity edema  LABORATORY DATA:  I have reviewed the data as listed CMP Latest Ref Rng & Units 05/25/2019 03/08/2019 11/23/2017  Glucose 70 - 99 mg/dL 111(H) 90 94  BUN 6 - 20 mg/dL _0 Creatinine 0.44 - 1.00 mg/dL 0.75 0.65 0.73  Sodium 135 - 145 mmol/L 140 140 140  Potassium 3.5 - 5.1 mmol/L 4.1 4.3 4.5  Chloride 98 - 111 mmol/L 107 107(H) 106  CO2 22 - 32 mmol/L 26 19(L) 20  Calcium 8.9 - 10.3 mg/dL 8.9 8.8 8.5(L)  Total Protein 6.5 - 8.1 g/dL 7.1 6.6 7.0  Total Bilirubin 0.3 - 1.2 mg/dL 0.3 0.5 <0.2  Alkaline Phos 38 - 126 U/L 65 65 61  AST 15 - 41 U/L 14(L) 16 18  ALT 0  - 44 U/L _1 Lab Results  Component Value Date   WBC 7.0 05/25/2019   HGB 13.7 05/25/2019   HCT 41.8 05/25/2019   MCV 82.8 05/25/2019   PLT 344 05/25/2019   NEUTROABS 4.6 05/25/2019    ASSESSMENT & PLAN:  Malignant neoplasm of upper-outer quadrant of left breast in female, estrogen receptor positive ([place]) 05/20/2019: Routine screening mammogram detected a 1.4cm left breast mass at the 2:30 position, no axillary adenopathy. 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  T1CN0 stage Ia clinical stage  06/14/2019: Left lumpectomy: Grade 2 IDC, 1.7 cm, high-grade DCIS, margins negative, no lymphovascular or perineural invasion, 0/2 lymph nodes negative, ER 100%, PR 100%, HER-2 negative, Ki-67 10%  Pathology counseling: I discussed the final pathology report of the patient provided  a copy of this report. I discussed the margins as well as lymph node surgeries. We also discussed the final staging along with previously performed ER/PR and HER-2/neu testing.  Recommendation: 1. Oncotype DX testing to determine if chemotherapy would be of any benefit followed by 2. Adjuvant radiation therapy followed by 3. Adjuvant antiestrogen therapy Genetic testing: Negative  Return to clinic based upon Oncotype test results    No orders of the  defined types were placed in this encounter.  The patient has a good understanding of the overall plan. she agrees with it. she will call with any problems that may develop before the next visit here.  Nicholas Lose, MD 06/22/2019  Julious Oka Dorshimer am acting as scribe for Dr. Nicholas Lose.  I have reviewed the above documentation for accuracy and completeness, and I agree with the above.

## 2019-06-22 ENCOUNTER — Other Ambulatory Visit: Payer: Self-pay

## 2019-06-22 ENCOUNTER — Inpatient Hospital Stay: Payer: 59 | Attending: Hematology and Oncology | Admitting: Hematology and Oncology

## 2019-06-22 DIAGNOSIS — C50412 Malignant neoplasm of upper-outer quadrant of left female breast: Secondary | ICD-10-CM | POA: Diagnosis not present

## 2019-06-22 DIAGNOSIS — Z170 Estrogen receptor positive status [ER+]: Secondary | ICD-10-CM | POA: Diagnosis not present

## 2019-06-22 DIAGNOSIS — Z79899 Other long term (current) drug therapy: Secondary | ICD-10-CM | POA: Diagnosis not present

## 2019-06-22 MED FILL — LOSARTAN POTASSIUM 50 MG TA: 50 | 30 days supply | Qty: 30 | Fill #3

## 2019-06-22 NOTE — Assessment & Plan Note (Signed)
05/20/2019: Routine screening mammogram detected a 1.4cm left breast mass at the 2:30 position, no axillary adenopathy. 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  T1CN0 stage Ia clinical stage  06/14/2019: Left lumpectomy: Grade 2 IDC, 1.7 cm, high-grade DCIS, margins negative, no lymphovascular or perineural invasion, 0/2 lymph nodes negative, ER 100%, PR 100%, HER-2 negative, Ki-67 10%  Pathology counseling: I discussed the final pathology report of the patient provided  a copy of this report. I discussed the margins as well as lymph node surgeries. We also discussed the final staging along with previously performed ER/PR and HER-2/neu testing.  Recommendation: 1. Oncotype DX testing to determine if chemotherapy would be of any benefit followed by 2. Adjuvant radiation therapy followed by 3. Adjuvant antiestrogen therapy Genetic testing  Return to clinic based upon Oncotype test results

## 2019-06-24 DIAGNOSIS — Z170 Estrogen receptor positive status [ER+]: Secondary | ICD-10-CM | POA: Diagnosis not present

## 2019-06-24 DIAGNOSIS — C50412 Malignant neoplasm of upper-outer quadrant of left female breast: Secondary | ICD-10-CM | POA: Diagnosis not present

## 2019-06-27 ENCOUNTER — Telehealth: Payer: Self-pay | Admitting: Radiation Oncology

## 2019-06-27 ENCOUNTER — Telehealth: Payer: Self-pay | Admitting: *Deleted

## 2019-06-27 DIAGNOSIS — Z170 Estrogen receptor positive status [ER+]: Secondary | ICD-10-CM

## 2019-06-27 DIAGNOSIS — C50412 Malignant neoplasm of upper-outer quadrant of left female breast: Secondary | ICD-10-CM

## 2019-06-27 NOTE — Telephone Encounter (Signed)
New message: ° ° °LVM for patient to return call to schedule appt from referral received. °

## 2019-06-27 NOTE — Telephone Encounter (Signed)
Received oncotype score of 13/4%. Physician team notified. Called pt with results. Left vm stating no chemo is recommended and next step is xrt. Contact information provided for questions or needs.

## 2019-06-29 ENCOUNTER — Encounter: Payer: Self-pay | Admitting: Radiation Oncology

## 2019-06-29 ENCOUNTER — Ambulatory Visit
Admission: RE | Admit: 2019-06-29 | Discharge: 2019-06-29 | Disposition: A | Discharge status: Home / Self Care | Payer: 59 | Source: Ambulatory Visit | Attending: Radiation Oncology | Admitting: Radiation Oncology

## 2019-06-29 ENCOUNTER — Other Ambulatory Visit: Payer: Self-pay

## 2019-06-29 DIAGNOSIS — Z803 Family history of malignant neoplasm of breast: Secondary | ICD-10-CM | POA: Diagnosis not present

## 2019-06-29 DIAGNOSIS — Z9889 Other specified postprocedural states: Secondary | ICD-10-CM | POA: Diagnosis not present

## 2019-06-29 DIAGNOSIS — C50412 Malignant neoplasm of upper-outer quadrant of left female breast: Secondary | ICD-10-CM | POA: Diagnosis not present

## 2019-06-29 DIAGNOSIS — Z170 Estrogen receptor positive status [ER+]: Secondary | ICD-10-CM | POA: Diagnosis not present

## 2019-06-29 DIAGNOSIS — Z0289 Encounter for other administrative examinations: Secondary | ICD-10-CM

## 2019-06-29 DIAGNOSIS — Z3009 Encounter for other general counseling and advice on contraception: Secondary | ICD-10-CM

## 2019-06-29 NOTE — Progress Notes (Signed)
Radiation Oncology         [phone] ________________________________  Outpatient Follow Up  - Conducted via telephone due to current COVID-19 concerns for limiting patient exposure  I spoke with the patient to conduct this consult visit via telephone to spare the patient unnecessary potential exposure in the healthcare setting during the current COVID-19 pandemic. The patient was notified in advance and was offered a [place] meeting to allow for face to face communication but unfortunately reported that they did not have the appropriate resources/technology to support such a visit and instead preferred to proceed with a telephone visit.   ________________________________  Name: Erin Gallagher        MRN: [id number]  Date of Service: 06/29/2019 DOB: Nov 06, 1970  [id number], Lilia Argue, MD  Nicholas Lose, MD     REFERRING PHYSICIAN: Nicholas Lose, MD   DIAGNOSIS: The encounter diagnosis was Malignant neoplasm of upper-outer quadrant of left breast in female, estrogen receptor positive ([place]).   HISTORY OF PRESENT ILLNESS: Erin Gallagher is a 48 y.o. female originally seen in the multidisciplinary breast clinic for a new diagnosis of left breast cancer. The patient was noted to have a mass in the left breast on screening mammogram as well as concerns for right axillary adenopathy. She was found on diagnostic imaging that the right axillary adenopathy resolved. She had a lesion in the left breast at 2:30 that measured 1.4 x 1.3 x 1.1 cm, and her axilla was negative. She underwent a biopsy on 05/16/2019 revealed a grade 1, invasive ductal carcinoma with associated DCIS. Her tumor was ER/PR positive, and HER2 negative, with a Ki 67 of 10%. She underwent lumpectomy on 06/14/2019 which revealed a 1.7 cm, grade 2 invasive ductal carcinoma with high grade DCIS. She had negative margins and two sampled nodes that were negative. She also had an oncotype score of 13 and will not need systemic chemotherapy. She  is contacted today by phone to discuss adjuvant radiotherapy.    PREVIOUS RADIATION THERAPY: No   PAST MEDICAL HISTORY:  Past Medical History:  Diagnosis Date   Arthritis    Family history of breast cancer    Family history of colon cancer    Family history of lung cancer    Family history of throat cancer    Gall bladder disease    gall bladder removal   Hypertension        PAST SURGICAL HISTORY: Past Surgical History:  Procedure Laterality Date   BREAST LUMPECTOMY WITH RADIOACTIVE SEED AND SENTINEL LYMPH NODE BIOPSY Left 06/14/2019   Procedure: LEFT BREAST LUMPECTOMY WITH RADIOACTIVE SEED AND SENTINEL LYMPH NODE BIOPSY;  Surgeon: [name], MD;  Location: [facility];  Service: General;  Laterality: Left;   CESAREAN SECTION     CHOLECYSTECTOMY     TUBAL LIGATION       FAMILY HISTORY:  Family History  Problem Relation Age of Onset   Hypertension Mother    Heart disease Father    Hypertension Father    Colon cancer Father        dx 33s   Stroke Maternal Grandmother    Hypertension Maternal Grandmother    Breast cancer Maternal Aunt        dx 9s   Throat cancer Maternal Uncle    Cancer Maternal Uncle    Lung cancer Paternal Uncle    Stomach cancer Neg Hx    Esophageal cancer Neg Hx    Rectal cancer Neg Hx  Liver cancer Neg Hx      SOCIAL HISTORY:  reports that she has never smoked. She has never used smokeless tobacco. She reports current alcohol use of about 2.0 standard drinks of alcohol per week. She reports that she does not use drugs. The patient is married and lives in [place]. 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    ALLERGIES: Other   MEDICATIONS:  Current Outpatient Medications  Medication Sig Dispense Refill   amLODipine (NORVASC) 10 MG tablet Take 1 tablet (10 mg total) by mouth daily. 90 tablet 1   cyclobenzaprine (FLEXERIL) 5 MG tablet 1 pill by mouth up to every 8 hours as needed.  Start with one pill by mouth each bedtime as needed due to sedation 15 tablet 0   losartan (COZAAR) 50 MG tablet Take 1 tablet (50 mg total) by mouth daily. 90 tablet 1   oxyCODONE (OXY IR/ROXICODONE) 5 MG immediate release tablet Take 1 tablet (5 mg total) by mouth every 6 (six) hours as needed for severe pain. (Patient not taking: Reported on 06/29/2019) 15 tablet 0   No current facility-administered medications for this encounter.      REVIEW OF SYSTEMS: On review of systems, the patient reports that she is doing well overall. She denies any chest pain, shortness of breath, cough, fevers, chills, night sweats, unintended weight changes. She denies any bowel or bladder disturbances, and denies abdominal pain, nausea or vomiting. She denies any new musculoskeletal or joint aches or pains. A complete review of systems is obtained and is otherwise negative.     PHYSICAL EXAM:  Wt Readings from Last 3 Encounters:  06/22/19 255 lb 1.6 oz (115.7 kg)  06/14/19 252 lb 13.9 oz (114.7 kg)  05/25/19 253 lb 12.8 oz (115.1 kg)   Unable to assess due to encounter type    ECOG = 0  0 - Asymptomatic (Fully active, able to carry on all predisease activities without restriction)  1 - Symptomatic but completely ambulatory (Restricted in physically strenuous activity but ambulatory and able to carry out work of a light or sedentary nature. For example, light housework, office work)  2 - Symptomatic, <50% in bed during the day (Ambulatory and capable of all self care but unable to carry out any work activities. Up and about more than 50% of waking hours)  3 - Symptomatic, >50% in bed, but not bedbound (Capable of only limited self-care, confined to bed or chair 50% or more of waking hours)  4 - Bedbound (Completely disabled. Cannot carry on any self-care. Totally confined to bed or chair)  5 - Death   Eustace Pen MM, Creech RH, Tormey DC, et al. [phone]). "Toxicity and response criteria of the [facility]". [place] Oncol. 5 (6): 649-55    LABORATORY DATA:  Lab Results  Component Value Date   WBC 7.0 05/25/2019   HGB 13.7 05/25/2019   HCT 41.8 05/25/2019   MCV 82.8 05/25/2019   PLT 344 05/25/2019   Lab Results  Component Value Date   NA 140 05/25/2019   K 4.1 05/25/2019   CL 107 05/25/2019   CO2 26 05/25/2019   Lab Results  Component Value Date   ALT 13 05/25/2019   AST 14 (L) 05/25/2019   ALKPHOS 65 05/25/2019   BILITOT 0.3 05/25/2019      RADIOGRAPHY: Nm Sentinel Node Inj-no Rpt (breast)  Result Date: 06/14/2019 Sulfur colloid was injected by the nuclear medicine technologist for melanoma  sentinel node.   Mm Breast Surgical Specimen  Result Date: 06/14/2019 CLINICAL DATA:  Radioactive seed localization was performed of a breast cancer on 06/13/2019. EXAM: SPECIMEN RADIOGRAPH OF THE LEFT BREAST COMPARISON:  Previous exam(s). FINDINGS: Status post excision of the left breast. The radioactive seed and ribbon biopsy marker clip are present, completely intact, and were marked for pathology. IMPRESSION: Specimen radiograph of the left breast. Electronically Signed   By: Curlene Dolphin M.D.   On: 06/14/2019 13:09   [place] Radioactive Seed Loc  Result Date: 06/13/2019 CLINICAL DATA:  48 year old female presenting for radioactive seed localization of a left breast mass at 2:30. EXAM: ULTRASOUND GUIDED RADIOACTIVE SEED LOCALIZATION OF THE LEFT BREAST COMPARISON:  Previous exam(s). FINDINGS: Patient presents for radioactive seed localization prior to left breast lumpectomy. I met with the patient and we discussed the procedure of seed localization including benefits and alternatives. We discussed the high likelihood of a successful procedure. We discussed the risks of the procedure including infection, bleeding, tissue injury and further surgery. We discussed the low dose of radioactivity involved in the procedure. Informed, written consent was given. The  usual time-out protocol was performed immediately prior to the procedure. Using ultrasound guidance, sterile technique, 1% lidocaine and an I-125 radioactive seed, the mass in the left breast at 2:30, 7 cm from the nipple was localized using an inferior approach. The follow-up mammogram images confirm the seed in the expected location and were marked for Dr. Barry Dienes. Follow-up survey of the patient confirms presence of the radioactive seed. Order number of I-125 seed:  [id number]. Total activity:  5.009 millicuries reference Date: 05/20/2019 The patient tolerated the procedure well and was released from the [place]. She was given instructions regarding seed removal. IMPRESSION: Radioactive seed localization a left breast mass at 2:30 breast. No apparent complications. Electronically Signed   By: Ammie Ferrier M.D.   On: 06/13/2019 12:42   Mm Clip Placement Left  Result Date: 06/13/2019 CLINICAL DATA:  Mammogram status post ultrasound-guided radioactive seed localization of the left breast. EXAM: DIAGNOSTIC LEFT MAMMOGRAM POST ULTRASOUND-GUIDED RADIOACTIVE SEED PLACEMENT COMPARISON:  Previous exam(s). FINDINGS: Mammographic images were obtained following ultrasound-guided radioactive seed placement. These demonstrate that the radioactive seed is within the mass in the upper-outer left breast, adjacent to the ribbon shaped biopsy marking clip. IMPRESSION: Appropriate location of the radioactive seed. Final Assessment: Post Procedure Mammograms for Seed Placement Electronically Signed   By: Ammie Ferrier M.D.   On: 06/13/2019 12:01       IMPRESSION/PLAN: 1. 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 Dr. Lisbeth Renshaw discusses the final pathology findings and reviews the nature of invasive left breast disease. She does not need any systemic therapy, but would benefit from external radiotherapy to the breast followed by antiestrogen therapy to reduce the risks  of recurrence. We discussed the risks, benefits, short, and long term effects of radiotherapy, and the patient is interested in proceeding. Dr. Lisbeth Renshaw discusses the delivery and logistics of radiotherapy and recommends a course of 6 1/2 weeks of radiotherapy with deep inspiration breath hold technique. She will be contacted to come in next week for simulation at which time she will sign written consent to proceed.  2. Contraceptive counseling. The patient has previously undergone tubal ligation and will not need Hcg testing prior to radiotherapy. 3. Time off paper work. The patient is not ready physically to go back to work given her healing, and given the demands  of radiotherapy, requests to be written out of work for treatment. A letter was placed in the chart to document this need.   Given current concerns for patient exposure during the COVID-19 pandemic, this encounter was conducted via telephone.  The patient has given verbal consent for this type of encounter. The time spent during this encounter was 25 minutes and 50% of that time was spent in the coordination of her care. The attendants for this meeting included Dr. Lisbeth Renshaw, Shona Simpson, [facility] and Golden Hurter [facility]  During the encounter, Dr. Lisbeth Renshaw and Shona Simpson [facility] were located at [facility] Radiation Oncology Department.  Golden Hurter [place]  was located at home.   The above documentation reflects my direct findings during this shared patient visit. Please see the separate note by Dr. Lisbeth Renshaw on this date for the remainder of the patient's plan of care.    Carola Rhine, PAC

## 2019-07-04 ENCOUNTER — Encounter: Payer: Self-pay | Admitting: Hematology and Oncology

## 2019-07-06 ENCOUNTER — Other Ambulatory Visit: Payer: Self-pay

## 2019-07-06 ENCOUNTER — Ambulatory Visit
Admission: RE | Admit: 2019-07-06 | Discharge: 2019-07-06 | Disposition: A | Discharge status: Home / Self Care | Payer: 59 | Source: Ambulatory Visit | Attending: Radiation Oncology | Admitting: Radiation Oncology

## 2019-07-06 DIAGNOSIS — C50412 Malignant neoplasm of upper-outer quadrant of left female breast: Secondary | ICD-10-CM | POA: Insufficient documentation

## 2019-07-06 DIAGNOSIS — Z483 Aftercare following surgery for neoplasm: Secondary | ICD-10-CM | POA: Diagnosis not present

## 2019-07-06 DIAGNOSIS — R600 Localized edema: Secondary | ICD-10-CM | POA: Diagnosis not present

## 2019-07-06 DIAGNOSIS — M25612 Stiffness of left shoulder, not elsewhere classified: Secondary | ICD-10-CM | POA: Diagnosis not present

## 2019-07-06 DIAGNOSIS — R293 Abnormal posture: Secondary | ICD-10-CM | POA: Diagnosis not present

## 2019-07-06 DIAGNOSIS — Z170 Estrogen receptor positive status [ER+]: Secondary | ICD-10-CM | POA: Insufficient documentation

## 2019-07-07 ENCOUNTER — Telehealth: Payer: Self-pay | Admitting: Radiation Oncology

## 2019-07-07 ENCOUNTER — Telehealth: Payer: Self-pay | Admitting: Hematology and Oncology

## 2019-07-07 ENCOUNTER — Telehealth: Payer: Self-pay | Admitting: Urology

## 2019-07-07 ENCOUNTER — Encounter: Payer: Self-pay | Admitting: *Deleted

## 2019-07-07 NOTE — Telephone Encounter (Signed)
Patient called this am and LM. I returned her call and she needs FMLA paperwork filled out. She sent the paperwork to our fax but I have not seen it. I will reach out to our front office to see if they have it, and if not ask them to call the patient to obtain the paperwork we need to sign.

## 2019-07-07 NOTE — Telephone Encounter (Signed)
Scheduled appt per 11/5 sch message - mailed reminder letter with appt date and time

## 2019-07-07 NOTE — Telephone Encounter (Signed)
Error

## 2019-07-08 ENCOUNTER — Telehealth: Payer: Self-pay

## 2019-07-08 NOTE — Telephone Encounter (Signed)
Spoke with the patient to tell her that her paper work will be ready to pick up on Monday when she comes in for appointment.

## 2019-07-08 NOTE — Telephone Encounter (Signed)
Spoke with the patient she is still trying to find out where her FMLA paper work is. I sent message to Thompson Caul and Ms Chaunte Hornbeck Christen.

## 2019-07-11 ENCOUNTER — Other Ambulatory Visit: Payer: Self-pay

## 2019-07-11 ENCOUNTER — Ambulatory Visit: Payer: 59 | Attending: General Surgery | Admitting: Physical Therapy

## 2019-07-11 ENCOUNTER — Encounter: Payer: Self-pay | Admitting: Physical Therapy

## 2019-07-11 ENCOUNTER — Telehealth: Payer: Self-pay | Admitting: *Deleted

## 2019-07-11 DIAGNOSIS — R600 Localized edema: Secondary | ICD-10-CM | POA: Insufficient documentation

## 2019-07-11 DIAGNOSIS — R293 Abnormal posture: Secondary | ICD-10-CM | POA: Insufficient documentation

## 2019-07-11 DIAGNOSIS — C50412 Malignant neoplasm of upper-outer quadrant of left female breast: Secondary | ICD-10-CM | POA: Insufficient documentation

## 2019-07-11 DIAGNOSIS — Z170 Estrogen receptor positive status [ER+]: Secondary | ICD-10-CM | POA: Insufficient documentation

## 2019-07-11 DIAGNOSIS — Z483 Aftercare following surgery for neoplasm: Secondary | ICD-10-CM | POA: Diagnosis not present

## 2019-07-11 DIAGNOSIS — M25612 Stiffness of left shoulder, not elsewhere classified: Secondary | ICD-10-CM | POA: Insufficient documentation

## 2019-07-11 NOTE — Telephone Encounter (Signed)
On 07-11-19 faxed fmla paper work to [employer]

## 2019-07-11 NOTE — Therapy (Signed)
[place], [place], [address] Phone: [phone]   Fax:  [phone]  Physical Therapy Treatment  Patient Details  Name: Erin Gallagher MRN: [id number] Date of Birth: 1971/03/04 Referring Provider (PT): Dr. [name]   Encounter Date: 07/11/2019  PT End of Session - 07/11/19 1429    Visit Number  2    Number of Visits  10    Date for PT Re-Evaluation  08/08/19    PT Start Time  3254    PT Stop Time  1403    PT Time Calculation (min)  58 min    Activity Tolerance  Patient tolerated treatment well    Behavior During Therapy  [employer] for tasks assessed/performed       Past Medical History:  Diagnosis Date  . Arthritis   . Family history of breast cancer   . Family history of colon cancer   . Family history of lung cancer   . Family history of throat cancer   . Gall bladder disease    gall bladder removal  . Hypertension     Past Surgical History:  Procedure Laterality Date  . BREAST LUMPECTOMY WITH RADIOACTIVE SEED AND SENTINEL LYMPH NODE BIOPSY Left 06/14/2019   Procedure: LEFT BREAST LUMPECTOMY WITH RADIOACTIVE SEED AND SENTINEL LYMPH NODE BIOPSY;  Surgeon: [name], MD;  Location: [facility];  Service: General;  Laterality: Left;  . CESAREAN SECTION    . CHOLECYSTECTOMY    . TUBAL LIGATION      There were no vitals filed for this visit.  Subjective Assessment - 07/11/19 1313    Subjective  Patient undewent a left lumpectomy and sentinel node biopsy (0/3 axillary nodes positive) on 06/14/2019. Her Oncotype score was low but she will begin radiation on 07/13/2019. She had a seroma after surgery and had it drained by her surgeon.    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 Patient undewent a left lumpectomy and sentinel node biopsy (0/3 axillary nodes positive) on 06/14/2019. It is ER/PR positive and  HER2 negative with a Ki67 of 10%.    Patient Stated Goals  See if we can help my left arm    Currently in Pain?  Yes    Pain Score  2     Pain Location  Axilla    Pain Orientation  Left    Pain Descriptors / Indicators  Tightness    Pain Type  Surgical pain    Pain Onset  1 to 4 weeks ago    Pain Frequency  Intermittent    Aggravating Factors   Moving my arm    Pain Relieving Factors  rest    Multiple Pain Sites  No         [facility] PT Assessment - 07/11/19 0001      Assessment   Medical Diagnosis  s/p left lumpectomy    Referring Provider (PT)  Dr. [name]    Onset Date/Surgical Date  06/14/19    Hand Dominance  Right    Prior Therapy  Baselines      Precautions   Precautions  Other (comment)    Precaution Comments  recent surgery; lfet arm lymphedema      Restrictions   Weight Bearing Restrictions  No      Balance Screen   Has the patient fallen in the past 6 months  No    Has the  patient had a decrease in activity level because of a fear of falling?   No    Is the patient reluctant to leave their home because of a fear of falling?   No      Home Environment   Living Environment  Private residence    Living Arrangements  Spouse/significant other;Children   Husband and 48 y.o. 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    Leisure  She has returned to walking 2 days per week for 20 minutes      Cognition   Overall Cognitive Status  Within Functional Limits for tasks assessed      Observation/Other Assessments   Observations  Visible cording present in left axilla extending into left medial upper arm and ending in her wrist. Incisions appear to be well healed. Edema present around left axillary incision.       Posture/Postural Control   Posture/Postural Control  Postural limitations    Postural  Limitations  Rounded Shoulders;Forward head      ROM / Strength   AROM / PROM / Strength  AROM      AROM   AROM Assessment Site  Shoulder    Right/Left Shoulder  Left    Left Shoulder Extension  38 Degrees    Left Shoulder Flexion  112 Degrees    Left Shoulder ABduction  116 Degrees    Left Shoulder Internal Rotation  44 Degrees    Left Shoulder External Rotation  67 Degrees      Strength   Overall Strength  Unable to assess;Due to pain        LYMPHEDEMA/ONCOLOGY QUESTIONNAIRE - 07/11/19 1320      Type   Cancer Type  Left breast cancer      Surgeries   Lumpectomy Date  06/14/19    Sentinel Lymph Node Biopsy Date  06/14/19    Number Lymph Nodes Removed  3      Treatment   Active Chemotherapy Treatment  No    Past Chemotherapy Treatment  No    Active Radiation Treatment  No    Past Radiation Treatment  No    Current Hormone Treatment  No    Past Hormone Therapy  No      What other symptoms do you have   Are you Having Heaviness or Tightness  Yes    Are you having Pain  Yes    Are you having pitting edema  No    Is it Hard or Difficult finding clothes that fit  No    Do you have infections  No    Is there Decreased scar mobility  No    Stemmer Sign  No      Right Upper Extremity Lymphedema   10 cm Proximal to Olecranon Process  39.9 cm    Olecranon Process  27.8 cm    10 cm Proximal to Ulnar Styloid Process  24.2 cm    Just Proximal to Ulnar Styloid Process  17.7 cm    Across Hand at [employer]  20.8 cm    At [place] of 2nd Digit  6.6 cm      Left Upper Extremity Lymphedema   10 cm Proximal to Olecranon Process  39.3 cm    Olecranon  Process  27.8 cm    10 cm Proximal to Ulnar Styloid Process  23 cm    Just Proximal to Ulnar Styloid Process  17.4 cm    Across Hand at [employer]  19.7 cm    At [place] of 2nd Digit  6.4 cm        Quick Dash - 07/11/19 0001    Open a tight or new jar  Moderate difficulty    Do heavy household chores (wash walls, wash  floors)  Mild difficulty    Carry a shopping bag or briefcase  Mild difficulty    Wash your back  Moderate difficulty    Use a knife to cut food  No difficulty    Recreational activities in which you take some force or impact through your arm, shoulder, or hand (golf, hammering, tennis)  Moderate difficulty    During the past week, to what extent has your arm, shoulder or hand problem interfered with your normal social activities with family, friends, neighbors, or groups?  Modererately    During the past week, to what extent has your arm, shoulder or hand problem limited your work or other regular daily activities  Slightly    Arm, shoulder, or hand pain.  Moderate    Tingling (pins and needles) in your arm, shoulder, or hand  Moderate    Difficulty Sleeping  Mild difficulty    DASH Score  36.36 %             [facility] Adult PT Treatment/Exercise - 07/11/19 0001      Manual Therapy   Manual Therapy  Myofascial release;Passive ROM    Myofascial Release  Myofascial release in left axilla and medial upper arm to try to improve cording. Cord is very thin and onviosuly limiting ROM    Passive ROM  PROM left shoulder all planes; significant guarding and ROM limited by pain due to cording.             PT Education - 07/11/19 1429    Education Details  Supine cane flexion and abduction ROM exercises    Person(s) Educated  Patient    Methods  Explanation;Demonstration;Handout    Comprehension  Returned demonstration;Verbalized understanding          PT Long Term Goals - 07/11/19 1435      PT LONG TERM GOAL #1   Title  Patient will demonstrate she has regained full shoulder ROM and function post operatively compared to baselines.    Time  4    Period  Weeks    Status  On-going    Target Date  08/08/19      PT LONG TERM GOAL #2   Title  Patient will increase left shoulder active flexion to >/= 140 degrees to achieve baseline function and be able to reach overhead.     Baseline  112 degrees    Time  4    Period  Weeks    Status  New    Target Date  08/08/19      PT LONG TERM GOAL #3   Title  Patient will increase left shoulder active abduction to >/= 150 degrees to achieve baseline function and be able to reach overhead.    Baseline  116 degrees4    Period  Weeks    Status  New    Target Date  08/08/19      PT LONG TERM GOAL #4   Title  Patient will improve her  DASH score to </= 10 for impoved overall upper extremity function.    Baseline  36.36 post op but 0 (zero) pre-op    Time  4    Period  Weeks    Status  New    Target Date  08/08/19      PT LONG TERM GOAL #5   Title  Patient will verbalize good understanding of lymphedema risk reduction practices.    Time  4    Period  Weeks    Status  New    Target Date  08/08/19            Plan - 07/11/19 1429    Clinical Impression Statement  Patient is generally doing well s/p left lumpectomy and sentinel node biopsy. She has significant prominent, palpable and visible cording in her left axilla which extends along her medical arm into her wrist. It becomes less visible distal to her elbow but is still palpable and painful. She has edema present in her left breast which is lateral and extending into her axilla. She will benefit from PT to improve shoulder ROM, reduce cording and reduce edema. Compression foam placed in left lateral bra today and she reported it felt better. She was also encouraged to purchase a compression bra.    Rehab Potential  Excellent    PT Frequency  2x / week    PT Treatment/Interventions  ADLs/Self Care Home Management;Therapeutic exercise;Patient/family education;Manual techniques;Manual lymph drainage;Taping;Passive range of motion;Scar mobilization;Compression bandaging;Therapeutic activities    PT Next Visit Plan  Manual techniques to reduce axillary cording; PROM; manual lymph drainage left breast    PT Home Exercise Plan  Post op shoulder ROM HEP and supine cane  exercises    Recommended Other Services  Compression bra    Consulted and Agree with Plan of Care  Patient       Patient will benefit from skilled therapeutic intervention in order to improve the following deficits and impairments:  Postural dysfunction, Decreased range of motion, Pain, Impaired UE functional use, Decreased knowledge of precautions, Increased edema, Increased fascial restricitons  Visit Diagnosis: Malignant neoplasm of upper-outer quadrant of left breast in female, estrogen receptor positive ([place]) - Plan: PT plan of care cert/re-cert  Abnormal posture - Plan: PT plan of care cert/re-cert  Stiffness of left shoulder, not elsewhere classified - Plan: PT plan of care cert/re-cert  Localized edema - Plan: PT plan of care cert/re-cert  Aftercare following surgery for neoplasm - Plan: PT plan of care cert/re-cert     Problem List Patient Active Problem List   Diagnosis Date Noted  . Family history of breast cancer   . Family history of throat cancer   . Family history of lung cancer   . Family history of colon cancer   . Malignant neoplasm of upper-outer quadrant of left breast in female, estrogen receptor positive ([place]) 05/20/2019  . Arthritis 11/23/2017  . Right-sided low back pain without sciatica 12/31/2015  . Left knee pain 11/28/2014  . HTN (hypertension) 06/22/2013  . Genetic testing 06/22/2013   Annia Friendly, PT 07/11/19 2:41 PM  [place], [place], [address] Phone: [phone]   Fax:  [phone]  Name: ZAMORA COLTON MRN: [id number] Date of Birth: 1971-01-23

## 2019-07-11 NOTE — Patient Instructions (Signed)
Cane Exercise: Abduction    Lay on your back. Hold cane with right hand over end, palm-up, with other hand palm-down. Move arm out from side and up by pushing with other arm. Hold __3__ seconds. Repeat __10__ times. Do __2__ sessions per day.  http://gt2.exer.us/82   Copyright  VHI. All rights reserved.  Flexion (Eccentric) - Active (Cane)    Lay on your back. Lift cane with both hands. Avoid hiking shoulders. Lower cane slowly for 3-5 seconds. _10__ reps per set, __2_ sets per day.  http://ecce.exer.us/155   Copyright  VHI. All rights reserved.

## 2019-07-12 DIAGNOSIS — C50412 Malignant neoplasm of upper-outer quadrant of left female breast: Secondary | ICD-10-CM | POA: Diagnosis not present

## 2019-07-12 DIAGNOSIS — Z170 Estrogen receptor positive status [ER+]: Secondary | ICD-10-CM | POA: Diagnosis not present

## 2019-07-12 DIAGNOSIS — M25612 Stiffness of left shoulder, not elsewhere classified: Secondary | ICD-10-CM | POA: Diagnosis not present

## 2019-07-12 DIAGNOSIS — R600 Localized edema: Secondary | ICD-10-CM | POA: Diagnosis not present

## 2019-07-12 DIAGNOSIS — R293 Abnormal posture: Secondary | ICD-10-CM | POA: Diagnosis not present

## 2019-07-12 DIAGNOSIS — Z483 Aftercare following surgery for neoplasm: Secondary | ICD-10-CM | POA: Diagnosis not present

## 2019-07-13 ENCOUNTER — Ambulatory Visit: Payer: 59

## 2019-07-13 ENCOUNTER — Ambulatory Visit
Admission: RE | Admit: 2019-07-13 | Discharge: 2019-07-13 | Disposition: A | Discharge status: Home / Self Care | Payer: 59 | Source: Ambulatory Visit | Attending: Radiation Oncology | Admitting: Radiation Oncology

## 2019-07-13 ENCOUNTER — Other Ambulatory Visit: Payer: Self-pay

## 2019-07-13 DIAGNOSIS — R600 Localized edema: Secondary | ICD-10-CM

## 2019-07-13 DIAGNOSIS — C50412 Malignant neoplasm of upper-outer quadrant of left female breast: Secondary | ICD-10-CM

## 2019-07-13 DIAGNOSIS — M25612 Stiffness of left shoulder, not elsewhere classified: Secondary | ICD-10-CM

## 2019-07-13 DIAGNOSIS — Z170 Estrogen receptor positive status [ER+]: Secondary | ICD-10-CM

## 2019-07-13 DIAGNOSIS — R293 Abnormal posture: Secondary | ICD-10-CM | POA: Diagnosis not present

## 2019-07-13 DIAGNOSIS — Z483 Aftercare following surgery for neoplasm: Secondary | ICD-10-CM

## 2019-07-13 NOTE — Therapy (Signed)
[place], [place], [address] Phone: [phone]   Fax:  [phone]  Physical Therapy Treatment  Patient Details  Name: Erin Gallagher MRN: [id number] Date of Birth: 1971-03-05 Referring Provider (PT): Dr. [name]   Encounter Date: 07/13/2019  PT End of Session - 07/13/19 0908    Visit Number  3    Number of Visits  10    Date for PT Re-Evaluation  08/08/19    PT Start Time  0805    PT Stop Time  0907    PT Time Calculation (min)  62 min    Activity Tolerance  Patient tolerated treatment well    Behavior During Therapy  [facility] for tasks assessed/performed       Past Medical History:  Diagnosis Date  . Arthritis   . Family history of breast cancer   . Family history of colon cancer   . Family history of lung cancer   . Family history of throat cancer   . Gall bladder disease    gall bladder removal  . Hypertension     Past Surgical History:  Procedure Laterality Date  . BREAST LUMPECTOMY WITH RADIOACTIVE SEED AND SENTINEL LYMPH NODE BIOPSY Left 06/14/2019   Procedure: LEFT BREAST LUMPECTOMY WITH RADIOACTIVE SEED AND SENTINEL LYMPH NODE BIOPSY;  Surgeon: [name], MD;  Location: [facility];  Service: General;  Laterality: Left;  . CESAREAN SECTION    . CHOLECYSTECTOMY    . TUBAL LIGATION      There were no vitals filed for this visit.  Subjective Assessment - 07/13/19 0807    Subjective  I've been doing the stretches Inez Catalina gave me and they are going okay. I've noticed a new area of tightness that feels like a vein in my upper chest that feels like the cording.    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 Patient undewent a left lumpectomy and sentinel node biopsy (0/3 axillary nodes positive) on 06/14/2019. It is ER/PR positive and HER2 negative with a Ki67 of 10%.    Patient Stated Goals  See if  we can help my left arm    Currently in Pain?  No/denies   not currently                      [facility] Adult PT Treatment/Exercise - 07/13/19 0001      Manual Therapy   Manual Therapy  Myofascial release;Manual Lymphatic Drainage (MLD);Passive ROM    Myofascial Release  Myofascial release in left axilla and medial upper arm to try to improve cording. Cord is very thin and obviosuly limiting ROM    Manual Lymphatic Drainage (MLD)  In Supine: Short neck, 5 diaphragmatic breaths, Lt inguinal and Rt axillary nodes, Lt axillo-inguinal and anterior inter-axillary anastomosis, then focused on Lt breast and upper arm, then into Rt S/L for further work at lateral breast along Lt axillo-inguinal and posterior inter-axillary anastmosis focusing on area over seroma, then finished in supine retracing steps along pathways.    Passive ROM  PROM left shoulder all planes; significant guarding and ROM limited by pain due to cording.                  PT Long Term Goals - 07/11/19 1435      PT LONG TERM GOAL #1   Title  Patient will demonstrate she has regained full shoulder  ROM and function post operatively compared to baselines.    Time  4    Period  Weeks    Status  On-going    Target Date  08/08/19      PT LONG TERM GOAL #2   Title  Patient will increase left shoulder active flexion to >/= 140 degrees to achieve baseline function and be able to reach overhead.    Baseline  112 degrees    Time  4    Period  Weeks    Status  New    Target Date  08/08/19      PT LONG TERM GOAL #3   Title  Patient will increase left shoulder active abduction to >/= 150 degrees to achieve baseline function and be able to reach overhead.    Baseline  116 degrees4    Period  Weeks    Status  New    Target Date  08/08/19      PT LONG TERM GOAL #4   Title  Patient will improve her DASH score to </= 10 for impoved overall upper extremity function.    Baseline  36.36 post op but 0 (zero) pre-op     Time  4    Period  Weeks    Status  New    Target Date  08/08/19      PT LONG TERM GOAL #5   Title  Patient will verbalize good understanding of lymphedema risk reduction practices.    Time  4    Period  Weeks    Status  New    Target Date  08/08/19            Plan - 07/13/19 0908    Clinical Impression Statement  Continued with myofascial release and P/ROM of Lt shoulder including manual lymph drainage, beginning to instruct pt while performing in basics of anatomy of lymphatic system and principles of MLD. Pt reports feeling some relief by end of session with cording tightness. Pt does have what feels to be cording at Lt superior chest to clavicle so instructed her in doorway stretch in varying positions to try to stretch this out gently. Also instructed pt to call doctor if seroma continues to increase as she said it is as she may need to be drained again which could help decresae some of cording symptoms and she verbalized understanding.    Stability/Clinical Decision Making  Stable/Uncomplicated    PT Frequency  2x / week    PT Duration  4 weeks    PT Treatment/Interventions  ADLs/Self Care Home Management;Therapeutic exercise;Patient/family education;Manual techniques;Manual lymph drainage;Taping;Passive range of motion;Scar mobilization;Compression bandaging;Therapeutic activities    PT Next Visit Plan  Manual techniques to reduce axillary cording; PROM; manual lymph drainage left breast and begin instructing pt in this; did she get an appointment for a compression bra?    PT Home Exercise Plan  Post op shoulder ROM HEP and supine cane exercises    Consulted and Agree with Plan of Care  Patient       Patient will benefit from skilled therapeutic intervention in order to improve the following deficits and impairments:  Postural dysfunction, Decreased range of motion, Pain, Impaired UE functional use, Decreased knowledge of precautions, Increased edema, Increased fascial  restricitons  Visit Diagnosis: Malignant neoplasm of upper-outer quadrant of left breast in female, estrogen receptor positive (HCC)  Abnormal posture  Stiffness of left shoulder, not elsewhere classified  Localized edema  Aftercare following surgery for neoplasm  Problem List Patient Active Problem List   Diagnosis Date Noted  . Family history of breast cancer   . Family history of throat cancer   . Family history of lung cancer   . Family history of colon cancer   . Malignant neoplasm of upper-outer quadrant of left breast in female, estrogen receptor positive ([place]) 05/20/2019  . Arthritis 11/23/2017  . Right-sided low back pain without sciatica 12/31/2015  . Left knee pain 11/28/2014  . HTN (hypertension) 06/22/2013  . Genetic testing 06/22/2013    Otelia Limes, PTA 07/13/2019, 11:42 AM  [place], [place], [address] Phone: [phone]   Fax:  [phone]  Name: Erin Gallagher MRN: [id number] Date of Birth: [birth year]

## 2019-07-14 ENCOUNTER — Ambulatory Visit
Admission: RE | Admit: 2019-07-14 | Discharge: 2019-07-14 | Disposition: A | Discharge status: Home / Self Care | Payer: 59 | Source: Ambulatory Visit | Attending: Radiation Oncology | Admitting: Radiation Oncology

## 2019-07-14 ENCOUNTER — Other Ambulatory Visit: Payer: Self-pay

## 2019-07-14 DIAGNOSIS — Z483 Aftercare following surgery for neoplasm: Secondary | ICD-10-CM | POA: Diagnosis not present

## 2019-07-14 DIAGNOSIS — R293 Abnormal posture: Secondary | ICD-10-CM | POA: Diagnosis not present

## 2019-07-14 DIAGNOSIS — C50412 Malignant neoplasm of upper-outer quadrant of left female breast: Secondary | ICD-10-CM | POA: Diagnosis not present

## 2019-07-14 DIAGNOSIS — M25612 Stiffness of left shoulder, not elsewhere classified: Secondary | ICD-10-CM | POA: Diagnosis not present

## 2019-07-14 DIAGNOSIS — Z170 Estrogen receptor positive status [ER+]: Secondary | ICD-10-CM | POA: Diagnosis not present

## 2019-07-14 DIAGNOSIS — R600 Localized edema: Secondary | ICD-10-CM | POA: Diagnosis not present

## 2019-07-15 ENCOUNTER — Other Ambulatory Visit: Payer: Self-pay

## 2019-07-15 ENCOUNTER — Ambulatory Visit
Admission: RE | Admit: 2019-07-15 | Discharge: 2019-07-15 | Disposition: A | Discharge status: Home / Self Care | Payer: 59 | Source: Ambulatory Visit | Attending: Radiation Oncology | Admitting: Radiation Oncology

## 2019-07-15 DIAGNOSIS — R293 Abnormal posture: Secondary | ICD-10-CM | POA: Diagnosis not present

## 2019-07-15 DIAGNOSIS — M25612 Stiffness of left shoulder, not elsewhere classified: Secondary | ICD-10-CM | POA: Diagnosis not present

## 2019-07-15 DIAGNOSIS — R600 Localized edema: Secondary | ICD-10-CM | POA: Diagnosis not present

## 2019-07-15 DIAGNOSIS — C50412 Malignant neoplasm of upper-outer quadrant of left female breast: Secondary | ICD-10-CM | POA: Diagnosis not present

## 2019-07-15 DIAGNOSIS — Z170 Estrogen receptor positive status [ER+]: Secondary | ICD-10-CM | POA: Diagnosis not present

## 2019-07-15 DIAGNOSIS — Z483 Aftercare following surgery for neoplasm: Secondary | ICD-10-CM | POA: Diagnosis not present

## 2019-07-18 ENCOUNTER — Ambulatory Visit
Admission: RE | Admit: 2019-07-18 | Discharge: 2019-07-18 | Disposition: A | Discharge status: Home / Self Care | Payer: 59 | Source: Ambulatory Visit | Attending: Radiation Oncology | Admitting: Radiation Oncology

## 2019-07-18 ENCOUNTER — Other Ambulatory Visit: Payer: Self-pay

## 2019-07-18 DIAGNOSIS — Z483 Aftercare following surgery for neoplasm: Secondary | ICD-10-CM | POA: Diagnosis not present

## 2019-07-18 DIAGNOSIS — R293 Abnormal posture: Secondary | ICD-10-CM | POA: Diagnosis not present

## 2019-07-18 DIAGNOSIS — M25612 Stiffness of left shoulder, not elsewhere classified: Secondary | ICD-10-CM | POA: Diagnosis not present

## 2019-07-18 DIAGNOSIS — C50412 Malignant neoplasm of upper-outer quadrant of left female breast: Secondary | ICD-10-CM | POA: Diagnosis not present

## 2019-07-18 DIAGNOSIS — R600 Localized edema: Secondary | ICD-10-CM | POA: Diagnosis not present

## 2019-07-18 DIAGNOSIS — Z170 Estrogen receptor positive status [ER+]: Secondary | ICD-10-CM | POA: Diagnosis not present

## 2019-07-19 ENCOUNTER — Other Ambulatory Visit: Payer: Self-pay

## 2019-07-19 ENCOUNTER — Ambulatory Visit
Admission: RE | Admit: 2019-07-19 | Discharge: 2019-07-19 | Disposition: A | Discharge status: Home / Self Care | Payer: 59 | Source: Ambulatory Visit | Attending: Radiation Oncology | Admitting: Radiation Oncology

## 2019-07-19 ENCOUNTER — Ambulatory Visit: Payer: 59

## 2019-07-19 DIAGNOSIS — M25612 Stiffness of left shoulder, not elsewhere classified: Secondary | ICD-10-CM | POA: Diagnosis not present

## 2019-07-19 DIAGNOSIS — Z170 Estrogen receptor positive status [ER+]: Secondary | ICD-10-CM | POA: Diagnosis not present

## 2019-07-19 DIAGNOSIS — C50412 Malignant neoplasm of upper-outer quadrant of left female breast: Secondary | ICD-10-CM

## 2019-07-19 DIAGNOSIS — R293 Abnormal posture: Secondary | ICD-10-CM

## 2019-07-19 DIAGNOSIS — Z483 Aftercare following surgery for neoplasm: Secondary | ICD-10-CM

## 2019-07-19 DIAGNOSIS — R600 Localized edema: Secondary | ICD-10-CM

## 2019-07-19 NOTE — Therapy (Signed)
[place], [place], [address] Phone: [phone]   Fax:  [phone]  Physical Therapy Treatment  Patient Details  Name: Erin Gallagher MRN: [id number] Date of Birth: 1971-03-23 Referring Provider (PT): Dr. [name]   Encounter Date: 07/19/2019  PT End of Session - 07/19/19 1003    Visit Number  4    Number of Visits  10    Date for PT Re-Evaluation  08/08/19    PT Start Time  0906    PT Stop Time  1002    PT Time Calculation (min)  56 min    Activity Tolerance  Patient tolerated treatment well    Behavior During Therapy  [facility] for tasks assessed/performed       Past Medical History:  Diagnosis Date  . Arthritis   . Family history of breast cancer   . Family history of colon cancer   . Family history of lung cancer   . Family history of throat cancer   . Gall bladder disease    gall bladder removal  . Hypertension     Past Surgical History:  Procedure Laterality Date  . BREAST LUMPECTOMY WITH RADIOACTIVE SEED AND SENTINEL LYMPH NODE BIOPSY Left 06/14/2019   Procedure: LEFT BREAST LUMPECTOMY WITH RADIOACTIVE SEED AND SENTINEL LYMPH NODE BIOPSY;  Surgeon: [name], MD;  Location: [facility];  Service: General;  Laterality: Left;  . CESAREAN SECTION    . CHOLECYSTECTOMY    . TUBAL LIGATION      There were no vitals filed for this visit.  Subjective Assessment - 07/19/19 0908    Subjective  The ABC class was helpful and I've been doing the stretches you sent me. I am just ready for the tightness to be gone.    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 Patient undewent a left lumpectomy and sentinel node biopsy (0/3 axillary nodes positive) on 06/14/2019. It is ER/PR positive and HER2 negative with a Ki67 of 10%.    Patient Stated Goals  See if we can help my left arm    Currently in Pain?  No/denies                        [facility] Adult PT Treatment/Exercise - 07/19/19 0001      Manual Therapy   Myofascial Release  Myofascial release in left axilla to try to decreasing cording. Cord is very thin and obviosuly limiting ROM    Manual Lymphatic Drainage (MLD)  In Supine: Short neck, 5 diaphragmatic breaths, Lt inguinal and Rt axillary nodes, Lt axillo-inguinal and anterior inter-axillary anastomosis, then focused on Lt breast and upper arm, then into Rt S/L for further work at lateral breast along Lt axillo-inguinal and posterior inter-axillary anastmosis focusing on area over seroma, then finished in supine retracing steps along pathways.    Passive ROM  PROM left shoulder all planes; significant guarding and ROM limited by pain due to cording.                  PT Long Term Goals - 07/11/19 1435      PT LONG TERM GOAL #1   Title  Patient will demonstrate she has regained full shoulder ROM and function post operatively compared to baselines.    Time  4    Period  Weeks    Status  On-going  Target Date  08/08/19      PT LONG TERM GOAL #2   Title  Patient will increase left shoulder active flexion to >/= 140 degrees to achieve baseline function and be able to reach overhead.    Baseline  112 degrees    Time  4    Period  Weeks    Status  New    Target Date  08/08/19      PT LONG TERM GOAL #3   Title  Patient will increase left shoulder active abduction to >/= 150 degrees to achieve baseline function and be able to reach overhead.    Baseline  116 degrees4    Period  Weeks    Status  New    Target Date  08/08/19      PT LONG TERM GOAL #4   Title  Patient will improve her DASH score to </= 10 for impoved overall upper extremity function.    Baseline  36.36 post op but 0 (zero) pre-op    Time  4    Period  Weeks    Status  New    Target Date  08/08/19      PT LONG TERM GOAL #5   Title  Patient will verbalize good understanding of lymphedema risk  reduction practices.    Time  4    Period  Weeks    Status  New    Target Date  08/08/19            Plan - 07/19/19 1004    Clinical Impression Statement  Pts seroma was visibly larger today and she reports increased tightness of Lt UE since yesterday. Pts cording was also noticeably tighter with manual therapy so advised pt to call Dr. Barry Dienes right after session today to see if she can get appt for possible draining of seroma again. Pt was agreeable to this. Also sent an inbox message to Dr. Barry Dienes updating her.    Stability/Clinical Decision Making  Stable/Uncomplicated    Rehab Potential  Excellent    PT Frequency  2x / week    PT Duration  4 weeks    PT Treatment/Interventions  ADLs/Self Care Home Management;Therapeutic exercise;Patient/family education;Manual techniques;Manual lymph drainage;Taping;Passive range of motion;Scar mobilization;Compression bandaging;Therapeutic activities    PT Next Visit Plan  Manual techniques to reduce axillary cording; PROM; manual lymph drainage left breast and begin instructing pt in this; did she get an appointment for a compression bra? did she see/call Dr. Barry Dienes about seroma?    PT Home Exercise Plan  Post op shoulder ROM HEP and supine cane exercises    Consulted and Agree with Plan of Care  Patient       Patient will benefit from skilled therapeutic intervention in order to improve the following deficits and impairments:  Postural dysfunction, Decreased range of motion, Pain, Impaired UE functional use, Decreased knowledge of precautions, Increased edema, Increased fascial restricitons  Visit Diagnosis: Malignant neoplasm of upper-outer quadrant of left breast in female, estrogen receptor positive (HCC)  Abnormal posture  Stiffness of left shoulder, not elsewhere classified  Localized edema  Aftercare following surgery for neoplasm     Problem List Patient Active Problem List   Diagnosis Date Noted  . Family history of breast  cancer   . Family history of throat cancer   . Family history of lung cancer   . Family history of colon cancer   . Malignant neoplasm of upper-outer quadrant of left breast in female, estrogen receptor  positive ([place]) 05/20/2019  . Arthritis 11/23/2017  . Right-sided low back pain without sciatica 12/31/2015  . Left knee pain 11/28/2014  . HTN (hypertension) 06/22/2013  . Genetic testing 06/22/2013    Otelia Limes, PTA 07/19/2019, 12:26 PM  [place], [place], [address] Phone: [phone]   Fax:  [phone]  Name: CLEORA KARNIK MRN: [id number] Date of Birth: 07-15-1971

## 2019-07-20 ENCOUNTER — Other Ambulatory Visit: Payer: Self-pay

## 2019-07-20 ENCOUNTER — Ambulatory Visit
Admission: RE | Admit: 2019-07-20 | Discharge: 2019-07-20 | Disposition: A | Discharge status: Home / Self Care | Payer: 59 | Source: Ambulatory Visit | Attending: Radiation Oncology | Admitting: Radiation Oncology

## 2019-07-20 DIAGNOSIS — M25612 Stiffness of left shoulder, not elsewhere classified: Secondary | ICD-10-CM | POA: Diagnosis not present

## 2019-07-20 DIAGNOSIS — Z483 Aftercare following surgery for neoplasm: Secondary | ICD-10-CM | POA: Diagnosis not present

## 2019-07-20 DIAGNOSIS — R600 Localized edema: Secondary | ICD-10-CM | POA: Diagnosis not present

## 2019-07-20 DIAGNOSIS — C50412 Malignant neoplasm of upper-outer quadrant of left female breast: Secondary | ICD-10-CM | POA: Diagnosis not present

## 2019-07-20 DIAGNOSIS — Z170 Estrogen receptor positive status [ER+]: Secondary | ICD-10-CM | POA: Diagnosis not present

## 2019-07-20 DIAGNOSIS — R293 Abnormal posture: Secondary | ICD-10-CM | POA: Diagnosis not present

## 2019-07-21 ENCOUNTER — Other Ambulatory Visit: Payer: Self-pay

## 2019-07-21 ENCOUNTER — Ambulatory Visit
Admission: RE | Admit: 2019-07-21 | Discharge: 2019-07-21 | Disposition: A | Discharge status: Home / Self Care | Payer: 59 | Source: Ambulatory Visit | Attending: Radiation Oncology | Admitting: Radiation Oncology

## 2019-07-21 DIAGNOSIS — R600 Localized edema: Secondary | ICD-10-CM | POA: Diagnosis not present

## 2019-07-21 DIAGNOSIS — R293 Abnormal posture: Secondary | ICD-10-CM | POA: Diagnosis not present

## 2019-07-21 DIAGNOSIS — Z483 Aftercare following surgery for neoplasm: Secondary | ICD-10-CM | POA: Diagnosis not present

## 2019-07-21 DIAGNOSIS — C50412 Malignant neoplasm of upper-outer quadrant of left female breast: Secondary | ICD-10-CM | POA: Diagnosis not present

## 2019-07-21 DIAGNOSIS — M25612 Stiffness of left shoulder, not elsewhere classified: Secondary | ICD-10-CM | POA: Diagnosis not present

## 2019-07-21 DIAGNOSIS — Z170 Estrogen receptor positive status [ER+]: Secondary | ICD-10-CM | POA: Diagnosis not present

## 2019-07-22 ENCOUNTER — Other Ambulatory Visit: Payer: Self-pay

## 2019-07-22 ENCOUNTER — Ambulatory Visit
Admission: RE | Admit: 2019-07-22 | Discharge: 2019-07-22 | Disposition: A | Discharge status: Home / Self Care | Payer: 59 | Source: Ambulatory Visit | Attending: Radiation Oncology | Admitting: Radiation Oncology

## 2019-07-22 DIAGNOSIS — Z483 Aftercare following surgery for neoplasm: Secondary | ICD-10-CM | POA: Diagnosis not present

## 2019-07-22 DIAGNOSIS — C50412 Malignant neoplasm of upper-outer quadrant of left female breast: Secondary | ICD-10-CM

## 2019-07-22 DIAGNOSIS — Z170 Estrogen receptor positive status [ER+]: Secondary | ICD-10-CM

## 2019-07-22 DIAGNOSIS — M25612 Stiffness of left shoulder, not elsewhere classified: Secondary | ICD-10-CM | POA: Diagnosis not present

## 2019-07-22 DIAGNOSIS — R293 Abnormal posture: Secondary | ICD-10-CM | POA: Diagnosis not present

## 2019-07-22 DIAGNOSIS — R600 Localized edema: Secondary | ICD-10-CM | POA: Diagnosis not present

## 2019-07-22 MED ORDER — ALRA NON-METALLIC DEODORANT (RAD-ONC)
1.0000 "application " | Freq: Once | TOPICAL | Status: AC
  Administered 2019-07-22: 1 via TOPICAL

## 2019-07-22 MED ORDER — RADIAPLEXRX EX GEL
Freq: Once | CUTANEOUS | Status: AC
  Administered 2019-07-22: 16:00:00 via TOPICAL

## 2019-07-22 MED FILL — DOXYCYCLINE HYCLATE 100 MG: 100 | 5 days supply | Qty: 10 | Fill #0

## 2019-07-24 ENCOUNTER — Ambulatory Visit
Admission: RE | Admit: 2019-07-24 | Discharge: 2019-07-24 | Disposition: A | Discharge status: Home / Self Care | Payer: 59 | Source: Ambulatory Visit | Attending: Radiation Oncology | Admitting: Radiation Oncology

## 2019-07-24 ENCOUNTER — Other Ambulatory Visit: Payer: Self-pay

## 2019-07-24 DIAGNOSIS — R600 Localized edema: Secondary | ICD-10-CM | POA: Diagnosis not present

## 2019-07-24 DIAGNOSIS — Z170 Estrogen receptor positive status [ER+]: Secondary | ICD-10-CM | POA: Diagnosis not present

## 2019-07-24 DIAGNOSIS — Z483 Aftercare following surgery for neoplasm: Secondary | ICD-10-CM | POA: Diagnosis not present

## 2019-07-24 DIAGNOSIS — M25612 Stiffness of left shoulder, not elsewhere classified: Secondary | ICD-10-CM | POA: Diagnosis not present

## 2019-07-24 DIAGNOSIS — C50412 Malignant neoplasm of upper-outer quadrant of left female breast: Secondary | ICD-10-CM | POA: Diagnosis not present

## 2019-07-24 DIAGNOSIS — R293 Abnormal posture: Secondary | ICD-10-CM | POA: Diagnosis not present

## 2019-07-25 ENCOUNTER — Ambulatory Visit
Admission: RE | Admit: 2019-07-25 | Discharge: 2019-07-25 | Disposition: A | Discharge status: Home / Self Care | Payer: 59 | Source: Ambulatory Visit | Attending: Radiation Oncology | Admitting: Radiation Oncology

## 2019-07-25 ENCOUNTER — Ambulatory Visit: Payer: 59

## 2019-07-25 ENCOUNTER — Other Ambulatory Visit: Payer: Self-pay

## 2019-07-25 DIAGNOSIS — R600 Localized edema: Secondary | ICD-10-CM

## 2019-07-25 DIAGNOSIS — M25612 Stiffness of left shoulder, not elsewhere classified: Secondary | ICD-10-CM | POA: Diagnosis not present

## 2019-07-25 DIAGNOSIS — Z170 Estrogen receptor positive status [ER+]: Secondary | ICD-10-CM | POA: Diagnosis not present

## 2019-07-25 DIAGNOSIS — R293 Abnormal posture: Secondary | ICD-10-CM | POA: Diagnosis not present

## 2019-07-25 DIAGNOSIS — Z483 Aftercare following surgery for neoplasm: Secondary | ICD-10-CM

## 2019-07-25 DIAGNOSIS — C50412 Malignant neoplasm of upper-outer quadrant of left female breast: Secondary | ICD-10-CM | POA: Diagnosis not present

## 2019-07-25 NOTE — Therapy (Signed)
[place], [place], [address] Phone: [phone]   Fax:  [phone]  Physical Therapy Treatment  Patient Details  Name: Erin Gallagher MRN: [id number] Date of Birth: Feb 24, 1971 Referring Provider (PT): Dr. [name]   Encounter Date: 07/25/2019  PT End of Session - 07/25/19 1404    Visit Number  5    Number of Visits  10    Date for PT Re-Evaluation  08/08/19    PT Start Time  1302    PT Stop Time  1402    PT Time Calculation (min)  60 min    Activity Tolerance  Patient tolerated treatment well    Behavior During Therapy  [facility] for tasks assessed/performed       Past Medical History:  Diagnosis Date  . Arthritis   . Family history of breast cancer   . Family history of colon cancer   . Family history of lung cancer   . Family history of throat cancer   . Gall bladder disease    gall bladder removal  . Hypertension     Past Surgical History:  Procedure Laterality Date  . BREAST LUMPECTOMY WITH RADIOACTIVE SEED AND SENTINEL LYMPH NODE BIOPSY Left 06/14/2019   Procedure: LEFT BREAST LUMPECTOMY WITH RADIOACTIVE SEED AND SENTINEL LYMPH NODE BIOPSY;  Surgeon: [name], MD;  Location: [facility];  Service: General;  Laterality: Left;  . CESAREAN SECTION    . CHOLECYSTECTOMY    . TUBAL LIGATION      There were no vitals filed for this visit.  Subjective Assessment - 07/25/19 1306    Subjective  I saw Dr. Chuck Hint PA and she drained over 80 cc's from my seroma and she put me on doxycycline but I'm not sure of the dose. It's just for 5 days. I'm just feeling sore at my breast today from where the seroma is. I tried calling [facility] for an appt but they weren't open yet so I'll call them again when I leave here.    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 Patient undewent a left lumpectomy and  sentinel node biopsy (0/3 axillary nodes positive) on 06/14/2019. It is ER/PR positive and HER2 negative with a Ki67 of 10%.    Patient Stated Goals  See if we can help my left arm    Currently in Pain?  No/denies                       [facility] Adult PT Treatment/Exercise - 07/25/19 0001      Manual Therapy   Myofascial Release  Myofascial release in left axilla to try to decreasing cording. Cord is very thin and obviosuly limiting ROM    Manual Lymphatic Drainage (MLD)  In Supine: Short neck, 5 diaphragmatic breaths, Lt inguinal and Rt axillary nodes, Lt axillo-inguinal and anterior inter-axillary anastomosis, then focused on Lt breast and upper arm, then into Rt S/L for further work at lateral breast along Lt axillo-inguinal and posterior inter-axillary anastmosis focusing on area over seroma, then finished in supine retracing steps along pathways.    Passive ROM  PROM left shoulder all planes; significant guarding and ROM limited by pain due to cording.                  PT Long Term Goals - 07/11/19 1435      PT LONG  TERM GOAL #1   Title  Patient will demonstrate she has regained full shoulder ROM and function post operatively compared to baselines.    Time  4    Period  Weeks    Status  On-going    Target Date  08/08/19      PT LONG TERM GOAL #2   Title  Patient will increase left shoulder active flexion to >/= 140 degrees to achieve baseline function and be able to reach overhead.    Baseline  112 degrees    Time  4    Period  Weeks    Status  New    Target Date  08/08/19      PT LONG TERM GOAL #3   Title  Patient will increase left shoulder active abduction to >/= 150 degrees to achieve baseline function and be able to reach overhead.    Baseline  116 degrees4    Period  Weeks    Status  New    Target Date  08/08/19      PT LONG TERM GOAL #4   Title  Patient will improve her DASH score to </= 10 for impoved overall upper extremity function.     Baseline  36.36 post op but 0 (zero) pre-op    Time  4    Period  Weeks    Status  New    Target Date  08/08/19      PT LONG TERM GOAL #5   Title  Patient will verbalize good understanding of lymphedema risk reduction practices.    Time  4    Period  Weeks    Status  New    Target Date  08/08/19            Plan - 07/25/19 1404    Clinical Impression Statement  Pts seroma was still very visible today despite having over 80 cc's drained from it Friday per pt report. Her cording did seem some improved though and pt repors it not feeling as tight. Instructed pt to cont stretching as much as able over holiday until next session next week. Pt has follow up with Dr Barry Dienes Dec. 7.    Stability/Clinical Decision Making  Stable/Uncomplicated    Rehab Potential  Excellent    PT Frequency  2x / week    PT Duration  4 weeks    PT Treatment/Interventions  ADLs/Self Care Home Management;Therapeutic exercise;Patient/family education;Manual techniques;Manual lymph drainage;Taping;Passive range of motion;Scar mobilization;Compression bandaging;Therapeutic activities    PT Next Visit Plan  Manual techniques to reduce axillary cording; PROM; manual lymph drainage left breast and begin instructing pt in this; did she get an appointment for a compression bra?    PT Home Exercise Plan  Post op shoulder ROM HEP and supine cane exercises    Consulted and Agree with Plan of Care  Patient       Patient will benefit from skilled therapeutic intervention in order to improve the following deficits and impairments:  Postural dysfunction, Decreased range of motion, Pain, Impaired UE functional use, Decreased knowledge of precautions, Increased edema, Increased fascial restricitons  Visit Diagnosis: Malignant neoplasm of upper-outer quadrant of left breast in female, estrogen receptor positive (HCC)  Abnormal posture  Stiffness of left shoulder, not elsewhere classified  Localized edema  Aftercare  following surgery for neoplasm     Problem List Patient Active Problem List   Diagnosis Date Noted  . Family history of breast cancer   . Family history of throat  cancer   . Family history of lung cancer   . Family history of colon cancer   . Malignant neoplasm of upper-outer quadrant of left breast in female, estrogen receptor positive ([place]) 05/20/2019  . Arthritis 11/23/2017  . Right-sided low back pain without sciatica 12/31/2015  . Left knee pain 11/28/2014  . HTN (hypertension) 06/22/2013  . Genetic testing 06/22/2013    Otelia Limes, PTA 07/25/2019, 2:07 PM  [place], [place], [address] Phone: [phone]   Fax:  [phone]  Name: DAVIONNE MASTRANGELO MRN: [id number] Date of Birth: 1970-11-08

## 2019-07-26 ENCOUNTER — Other Ambulatory Visit: Payer: Self-pay

## 2019-07-26 ENCOUNTER — Ambulatory Visit
Admission: RE | Admit: 2019-07-26 | Discharge: 2019-07-26 | Disposition: A | Discharge status: Home / Self Care | Payer: 59 | Source: Ambulatory Visit | Attending: Radiation Oncology | Admitting: Radiation Oncology

## 2019-07-26 DIAGNOSIS — R293 Abnormal posture: Secondary | ICD-10-CM | POA: Diagnosis not present

## 2019-07-26 DIAGNOSIS — C50412 Malignant neoplasm of upper-outer quadrant of left female breast: Secondary | ICD-10-CM | POA: Diagnosis not present

## 2019-07-26 DIAGNOSIS — M25612 Stiffness of left shoulder, not elsewhere classified: Secondary | ICD-10-CM | POA: Diagnosis not present

## 2019-07-26 DIAGNOSIS — Z170 Estrogen receptor positive status [ER+]: Secondary | ICD-10-CM | POA: Diagnosis not present

## 2019-07-26 DIAGNOSIS — Z483 Aftercare following surgery for neoplasm: Secondary | ICD-10-CM | POA: Diagnosis not present

## 2019-07-26 DIAGNOSIS — R600 Localized edema: Secondary | ICD-10-CM | POA: Diagnosis not present

## 2019-07-26 MED FILL — LOSARTAN POTASSIUM 50 MG TA: 50 | 30 days supply | Qty: 30 | Fill #4

## 2019-07-27 ENCOUNTER — Ambulatory Visit
Admission: RE | Admit: 2019-07-27 | Discharge: 2019-07-27 | Disposition: A | Discharge status: Home / Self Care | Payer: 59 | Source: Ambulatory Visit | Attending: Radiation Oncology | Admitting: Radiation Oncology

## 2019-07-27 ENCOUNTER — Other Ambulatory Visit: Payer: Self-pay

## 2019-07-27 DIAGNOSIS — C50412 Malignant neoplasm of upper-outer quadrant of left female breast: Secondary | ICD-10-CM | POA: Diagnosis not present

## 2019-07-27 DIAGNOSIS — R600 Localized edema: Secondary | ICD-10-CM | POA: Diagnosis not present

## 2019-07-27 DIAGNOSIS — Z170 Estrogen receptor positive status [ER+]: Secondary | ICD-10-CM | POA: Diagnosis not present

## 2019-07-27 DIAGNOSIS — R293 Abnormal posture: Secondary | ICD-10-CM | POA: Diagnosis not present

## 2019-07-27 DIAGNOSIS — M25612 Stiffness of left shoulder, not elsewhere classified: Secondary | ICD-10-CM | POA: Diagnosis not present

## 2019-07-27 DIAGNOSIS — Z483 Aftercare following surgery for neoplasm: Secondary | ICD-10-CM | POA: Diagnosis not present

## 2019-07-31 ENCOUNTER — Ambulatory Visit: Payer: 59

## 2019-08-01 ENCOUNTER — Other Ambulatory Visit: Payer: Self-pay

## 2019-08-01 ENCOUNTER — Ambulatory Visit
Admission: RE | Admit: 2019-08-01 | Discharge: 2019-08-01 | Disposition: A | Discharge status: Home / Self Care | Payer: 59 | Source: Ambulatory Visit | Attending: Radiation Oncology | Admitting: Radiation Oncology

## 2019-08-01 DIAGNOSIS — M25612 Stiffness of left shoulder, not elsewhere classified: Secondary | ICD-10-CM | POA: Diagnosis not present

## 2019-08-01 DIAGNOSIS — R293 Abnormal posture: Secondary | ICD-10-CM | POA: Diagnosis not present

## 2019-08-01 DIAGNOSIS — Z170 Estrogen receptor positive status [ER+]: Secondary | ICD-10-CM | POA: Diagnosis not present

## 2019-08-01 DIAGNOSIS — C50412 Malignant neoplasm of upper-outer quadrant of left female breast: Secondary | ICD-10-CM | POA: Diagnosis not present

## 2019-08-01 DIAGNOSIS — R600 Localized edema: Secondary | ICD-10-CM | POA: Diagnosis not present

## 2019-08-01 DIAGNOSIS — Z483 Aftercare following surgery for neoplasm: Secondary | ICD-10-CM | POA: Diagnosis not present

## 2019-08-02 ENCOUNTER — Other Ambulatory Visit: Payer: Self-pay

## 2019-08-02 ENCOUNTER — Ambulatory Visit
Admission: RE | Admit: 2019-08-02 | Discharge: 2019-08-02 | Disposition: A | Discharge status: Home / Self Care | Payer: 59 | Source: Ambulatory Visit | Attending: Radiation Oncology | Admitting: Radiation Oncology

## 2019-08-02 DIAGNOSIS — Z79810 Long term (current) use of selective estrogen receptor modulators (SERMs): Secondary | ICD-10-CM | POA: Diagnosis not present

## 2019-08-02 DIAGNOSIS — Z170 Estrogen receptor positive status [ER+]: Secondary | ICD-10-CM | POA: Insufficient documentation

## 2019-08-02 DIAGNOSIS — C50412 Malignant neoplasm of upper-outer quadrant of left female breast: Secondary | ICD-10-CM | POA: Insufficient documentation

## 2019-08-02 DIAGNOSIS — Z79899 Other long term (current) drug therapy: Secondary | ICD-10-CM | POA: Insufficient documentation

## 2019-08-02 DIAGNOSIS — Z510 Encounter for antineoplastic radiation therapy: Secondary | ICD-10-CM | POA: Diagnosis not present

## 2019-08-02 NOTE — Progress Notes (Signed)
  Radiation Oncology         [phone] ________________________________  Name: Erin Gallagher MRN: [id number]  Date: 07/06/2019  DOB: 1971/05/13  DIAGNOSIS:     ICD-10-CM   1. Malignant neoplasm of upper-outer quadrant of left breast in female, estrogen receptor positive ([place])  C50.412    Z17.0      SIMULATION AND TREATMENT PLANNING NOTE  The patient presented for simulation prior to beginning her course of radiation treatment for her diagnosis of left-sided breast cancer. The patient was placed in a supine position on a breast board. A customized vac-lock bag was constructed and this complex treatment device will be used on a daily basis during her treatment. In this fashion, a CT scan was obtained through the chest area and an isocenter was placed near the chest wall within the breast.  The patient will be planned to receive a course of radiation initially to a dose of 50.4 Gy. This will consist of a whole breast radiotherapy technique. To accomplish this, 2 customized blocks have been designed which will correspond to medial and lateral whole breast tangent fields. This treatment will be accomplished at 1.8 Gy per fraction. A forward planning technique will also be evaluated to determine if this approach improves the plan. It is anticipated that the patient will then receive a 10 Gy boost to the seroma cavity which has been contoured. This will be accomplished at 2 Gy per fraction.   This initial treatment will consist of a 3-D conformal technique. The seroma has been contoured as the primary target structure. Additionally, dose volume histograms of both this target as well as the lungs and heart will also be evaluated. Such an approach is necessary to ensure that the target area is adequately covered while the nearby critical  normal structures are adequately spared.   Plan:  The final anticipated total dose therefore will correspond to 60.4 Gy.    Special treatment procedure was  performed today due to the extra time and effort required by myself to plan and prepare this patient for deep inspiration breath hold technique.  I have determined cardiac sparing to be of benefit to this patient to prevent long term cardiac damage due to radiation of the heart.  Bellows were placed on the patient's abdomen. To facilitate cardiac sparing, the patient was coached by the radiation therapists on breath hold techniques and breathing practice was performed. Practice waveforms were obtained. The patient was then scanned while maintaining breath hold in the treatment position.  This image was then transferred over to the imaging specialist. The imaging specialist then created a fusion of the free breathing and breath hold scans using the chest wall as the stable structure. I personally reviewed the fusion in axial, coronal and sagittal image planes.  Excellent cardiac sparing was obtained.  I felt the patient is an appropriate candidate for breath hold and the patient will be treated as such.  The image fusion was then reviewed with the patient to reinforce the necessity of reproducible breath hold.     _______________________________   Jodelle Gross, MD, PhD

## 2019-08-02 NOTE — Progress Notes (Signed)
  Radiation Oncology         [phone] ________________________________  Name: ZEKIA GRAHEK MRN: [id number]  Date: 07/06/2019  DOB: [birth year]  Optical Surface Tracking Plan:  Since intensity modulated radiotherapy (IMRT) and 3D conformal radiation treatment methods are predicated on accurate and precise positioning for treatment, intrafraction motion monitoring is medically necessary to ensure accurate and safe treatment delivery.  The ability to quantify intrafraction motion without excessive ionizing radiation dose can only be performed with optical surface tracking. Accordingly, surface imaging offers the opportunity to obtain 3D measurements of patient position throughout IMRT and 3D treatments without excessive radiation exposure.  I am ordering optical surface tracking for this patient's upcoming course of radiotherapy. ________________________________  Kyung Rudd, MD 08/02/2019 4:51 PM    Reference:   Ursula Alert, J, et al. Surface imaging-based analysis of intrafraction motion for breast radiotherapy patients.Journal of [place], n. 6, nov. 2014. ISSN [id number].   Available at: <http://www.jacmp.org/index.php/jacmp/article/view/4957>.

## 2019-08-03 ENCOUNTER — Ambulatory Visit
Admission: RE | Admit: 2019-08-03 | Discharge: 2019-08-03 | Disposition: A | Discharge status: Home / Self Care | Payer: 59 | Source: Ambulatory Visit | Attending: Radiation Oncology | Admitting: Radiation Oncology

## 2019-08-03 ENCOUNTER — Other Ambulatory Visit: Payer: Self-pay

## 2019-08-03 DIAGNOSIS — Z79810 Long term (current) use of selective estrogen receptor modulators (SERMs): Secondary | ICD-10-CM | POA: Diagnosis not present

## 2019-08-03 DIAGNOSIS — C50412 Malignant neoplasm of upper-outer quadrant of left female breast: Secondary | ICD-10-CM | POA: Diagnosis not present

## 2019-08-03 DIAGNOSIS — Z79899 Other long term (current) drug therapy: Secondary | ICD-10-CM | POA: Diagnosis not present

## 2019-08-03 DIAGNOSIS — Z510 Encounter for antineoplastic radiation therapy: Secondary | ICD-10-CM | POA: Diagnosis not present

## 2019-08-03 DIAGNOSIS — Z170 Estrogen receptor positive status [ER+]: Secondary | ICD-10-CM | POA: Diagnosis not present

## 2019-08-04 ENCOUNTER — Ambulatory Visit
Admission: RE | Admit: 2019-08-04 | Discharge: 2019-08-04 | Disposition: A | Discharge status: Home / Self Care | Payer: 59 | Source: Ambulatory Visit | Attending: Radiation Oncology | Admitting: Radiation Oncology

## 2019-08-04 ENCOUNTER — Other Ambulatory Visit: Payer: Self-pay

## 2019-08-04 DIAGNOSIS — C50412 Malignant neoplasm of upper-outer quadrant of left female breast: Secondary | ICD-10-CM | POA: Diagnosis not present

## 2019-08-04 DIAGNOSIS — Z510 Encounter for antineoplastic radiation therapy: Secondary | ICD-10-CM | POA: Diagnosis not present

## 2019-08-04 DIAGNOSIS — Z170 Estrogen receptor positive status [ER+]: Secondary | ICD-10-CM | POA: Diagnosis not present

## 2019-08-04 DIAGNOSIS — Z79899 Other long term (current) drug therapy: Secondary | ICD-10-CM | POA: Diagnosis not present

## 2019-08-04 DIAGNOSIS — Z79810 Long term (current) use of selective estrogen receptor modulators (SERMs): Secondary | ICD-10-CM | POA: Diagnosis not present

## 2019-08-05 ENCOUNTER — Other Ambulatory Visit: Payer: Self-pay

## 2019-08-05 ENCOUNTER — Ambulatory Visit
Admission: RE | Admit: 2019-08-05 | Discharge: 2019-08-05 | Disposition: A | Discharge status: Home / Self Care | Payer: 59 | Source: Ambulatory Visit | Attending: Radiation Oncology | Admitting: Radiation Oncology

## 2019-08-05 DIAGNOSIS — C50412 Malignant neoplasm of upper-outer quadrant of left female breast: Secondary | ICD-10-CM | POA: Diagnosis not present

## 2019-08-05 DIAGNOSIS — Z170 Estrogen receptor positive status [ER+]: Secondary | ICD-10-CM | POA: Diagnosis not present

## 2019-08-05 DIAGNOSIS — Z510 Encounter for antineoplastic radiation therapy: Secondary | ICD-10-CM | POA: Diagnosis not present

## 2019-08-05 DIAGNOSIS — Z79810 Long term (current) use of selective estrogen receptor modulators (SERMs): Secondary | ICD-10-CM | POA: Diagnosis not present

## 2019-08-05 DIAGNOSIS — Z79899 Other long term (current) drug therapy: Secondary | ICD-10-CM | POA: Diagnosis not present

## 2019-08-08 ENCOUNTER — Ambulatory Visit
Admission: RE | Admit: 2019-08-08 | Discharge: 2019-08-08 | Disposition: A | Discharge status: Home / Self Care | Payer: 59 | Source: Ambulatory Visit | Attending: Radiation Oncology | Admitting: Radiation Oncology

## 2019-08-08 ENCOUNTER — Other Ambulatory Visit: Payer: Self-pay

## 2019-08-08 DIAGNOSIS — C50412 Malignant neoplasm of upper-outer quadrant of left female breast: Secondary | ICD-10-CM | POA: Diagnosis not present

## 2019-08-08 DIAGNOSIS — Z510 Encounter for antineoplastic radiation therapy: Secondary | ICD-10-CM | POA: Diagnosis not present

## 2019-08-08 DIAGNOSIS — Z79899 Other long term (current) drug therapy: Secondary | ICD-10-CM | POA: Diagnosis not present

## 2019-08-08 DIAGNOSIS — Z170 Estrogen receptor positive status [ER+]: Secondary | ICD-10-CM | POA: Diagnosis not present

## 2019-08-08 DIAGNOSIS — Z79810 Long term (current) use of selective estrogen receptor modulators (SERMs): Secondary | ICD-10-CM | POA: Diagnosis not present

## 2019-08-09 ENCOUNTER — Other Ambulatory Visit: Payer: Self-pay

## 2019-08-09 ENCOUNTER — Ambulatory Visit
Admission: RE | Admit: 2019-08-09 | Discharge: 2019-08-09 | Disposition: A | Discharge status: Home / Self Care | Payer: 59 | Source: Ambulatory Visit | Attending: Radiation Oncology | Admitting: Radiation Oncology

## 2019-08-09 DIAGNOSIS — Z510 Encounter for antineoplastic radiation therapy: Secondary | ICD-10-CM | POA: Diagnosis not present

## 2019-08-09 DIAGNOSIS — Z79899 Other long term (current) drug therapy: Secondary | ICD-10-CM | POA: Diagnosis not present

## 2019-08-09 DIAGNOSIS — Z79810 Long term (current) use of selective estrogen receptor modulators (SERMs): Secondary | ICD-10-CM | POA: Diagnosis not present

## 2019-08-09 DIAGNOSIS — Z170 Estrogen receptor positive status [ER+]: Secondary | ICD-10-CM | POA: Diagnosis not present

## 2019-08-09 DIAGNOSIS — C50412 Malignant neoplasm of upper-outer quadrant of left female breast: Secondary | ICD-10-CM | POA: Diagnosis not present

## 2019-08-10 ENCOUNTER — Other Ambulatory Visit: Payer: Self-pay

## 2019-08-10 ENCOUNTER — Ambulatory Visit
Admission: RE | Admit: 2019-08-10 | Discharge: 2019-08-10 | Disposition: A | Discharge status: Home / Self Care | Payer: 59 | Source: Ambulatory Visit | Attending: Radiation Oncology | Admitting: Radiation Oncology

## 2019-08-10 DIAGNOSIS — C50412 Malignant neoplasm of upper-outer quadrant of left female breast: Secondary | ICD-10-CM | POA: Diagnosis not present

## 2019-08-10 DIAGNOSIS — Z170 Estrogen receptor positive status [ER+]: Secondary | ICD-10-CM | POA: Diagnosis not present

## 2019-08-10 DIAGNOSIS — Z79810 Long term (current) use of selective estrogen receptor modulators (SERMs): Secondary | ICD-10-CM | POA: Diagnosis not present

## 2019-08-10 DIAGNOSIS — Z79899 Other long term (current) drug therapy: Secondary | ICD-10-CM | POA: Diagnosis not present

## 2019-08-10 DIAGNOSIS — Z510 Encounter for antineoplastic radiation therapy: Secondary | ICD-10-CM | POA: Diagnosis not present

## 2019-08-11 ENCOUNTER — Ambulatory Visit
Admission: RE | Admit: 2019-08-11 | Discharge: 2019-08-11 | Disposition: A | Discharge status: Home / Self Care | Payer: 59 | Source: Ambulatory Visit | Attending: Radiation Oncology | Admitting: Radiation Oncology

## 2019-08-11 ENCOUNTER — Other Ambulatory Visit: Payer: Self-pay

## 2019-08-11 DIAGNOSIS — Z79899 Other long term (current) drug therapy: Secondary | ICD-10-CM | POA: Diagnosis not present

## 2019-08-11 DIAGNOSIS — C50912 Malignant neoplasm of unspecified site of left female breast: Secondary | ICD-10-CM | POA: Diagnosis not present

## 2019-08-11 DIAGNOSIS — Z79810 Long term (current) use of selective estrogen receptor modulators (SERMs): Secondary | ICD-10-CM | POA: Diagnosis not present

## 2019-08-11 DIAGNOSIS — Z510 Encounter for antineoplastic radiation therapy: Secondary | ICD-10-CM | POA: Diagnosis not present

## 2019-08-11 DIAGNOSIS — Z170 Estrogen receptor positive status [ER+]: Secondary | ICD-10-CM | POA: Diagnosis not present

## 2019-08-11 DIAGNOSIS — C50412 Malignant neoplasm of upper-outer quadrant of left female breast: Secondary | ICD-10-CM | POA: Diagnosis not present

## 2019-08-12 ENCOUNTER — Ambulatory Visit
Admission: RE | Admit: 2019-08-12 | Discharge: 2019-08-12 | Disposition: A | Discharge status: Home / Self Care | Payer: 59 | Source: Ambulatory Visit | Attending: Radiation Oncology | Admitting: Radiation Oncology

## 2019-08-12 ENCOUNTER — Other Ambulatory Visit: Payer: Self-pay

## 2019-08-12 DIAGNOSIS — Z79899 Other long term (current) drug therapy: Secondary | ICD-10-CM | POA: Diagnosis not present

## 2019-08-12 DIAGNOSIS — Z170 Estrogen receptor positive status [ER+]: Secondary | ICD-10-CM | POA: Diagnosis not present

## 2019-08-12 DIAGNOSIS — C50412 Malignant neoplasm of upper-outer quadrant of left female breast: Secondary | ICD-10-CM | POA: Diagnosis not present

## 2019-08-12 DIAGNOSIS — Z79810 Long term (current) use of selective estrogen receptor modulators (SERMs): Secondary | ICD-10-CM | POA: Diagnosis not present

## 2019-08-12 DIAGNOSIS — Z510 Encounter for antineoplastic radiation therapy: Secondary | ICD-10-CM | POA: Diagnosis not present

## 2019-08-15 ENCOUNTER — Ambulatory Visit: Payer: 59

## 2019-08-16 ENCOUNTER — Ambulatory Visit
Admission: RE | Admit: 2019-08-16 | Discharge: 2019-08-16 | Disposition: A | Discharge status: Home / Self Care | Payer: 59 | Source: Ambulatory Visit | Attending: Radiation Oncology | Admitting: Radiation Oncology

## 2019-08-16 ENCOUNTER — Ambulatory Visit: Payer: 59 | Admitting: Radiation Oncology

## 2019-08-16 ENCOUNTER — Other Ambulatory Visit: Payer: Self-pay

## 2019-08-16 DIAGNOSIS — Z510 Encounter for antineoplastic radiation therapy: Secondary | ICD-10-CM | POA: Diagnosis not present

## 2019-08-16 DIAGNOSIS — Z79899 Other long term (current) drug therapy: Secondary | ICD-10-CM | POA: Diagnosis not present

## 2019-08-16 DIAGNOSIS — Z170 Estrogen receptor positive status [ER+]: Secondary | ICD-10-CM | POA: Diagnosis not present

## 2019-08-16 DIAGNOSIS — Z79810 Long term (current) use of selective estrogen receptor modulators (SERMs): Secondary | ICD-10-CM | POA: Diagnosis not present

## 2019-08-16 DIAGNOSIS — C50412 Malignant neoplasm of upper-outer quadrant of left female breast: Secondary | ICD-10-CM | POA: Diagnosis not present

## 2019-08-17 ENCOUNTER — Other Ambulatory Visit: Payer: Self-pay

## 2019-08-17 ENCOUNTER — Ambulatory Visit
Admission: RE | Admit: 2019-08-17 | Discharge: 2019-08-17 | Disposition: A | Discharge status: Home / Self Care | Payer: 59 | Source: Ambulatory Visit | Attending: Radiation Oncology | Admitting: Radiation Oncology

## 2019-08-17 DIAGNOSIS — Z170 Estrogen receptor positive status [ER+]: Secondary | ICD-10-CM | POA: Diagnosis not present

## 2019-08-17 DIAGNOSIS — Z79899 Other long term (current) drug therapy: Secondary | ICD-10-CM | POA: Diagnosis not present

## 2019-08-17 DIAGNOSIS — Z79810 Long term (current) use of selective estrogen receptor modulators (SERMs): Secondary | ICD-10-CM | POA: Diagnosis not present

## 2019-08-17 DIAGNOSIS — C50412 Malignant neoplasm of upper-outer quadrant of left female breast: Secondary | ICD-10-CM | POA: Diagnosis not present

## 2019-08-17 DIAGNOSIS — Z510 Encounter for antineoplastic radiation therapy: Secondary | ICD-10-CM | POA: Diagnosis not present

## 2019-08-18 ENCOUNTER — Other Ambulatory Visit: Payer: Self-pay

## 2019-08-18 ENCOUNTER — Telehealth: Payer: Self-pay

## 2019-08-18 ENCOUNTER — Ambulatory Visit
Admission: RE | Admit: 2019-08-18 | Discharge: 2019-08-18 | Disposition: A | Discharge status: Home / Self Care | Payer: 59 | Source: Ambulatory Visit | Attending: Radiation Oncology | Admitting: Radiation Oncology

## 2019-08-18 DIAGNOSIS — C50412 Malignant neoplasm of upper-outer quadrant of left female breast: Secondary | ICD-10-CM | POA: Diagnosis not present

## 2019-08-18 DIAGNOSIS — Z79899 Other long term (current) drug therapy: Secondary | ICD-10-CM | POA: Diagnosis not present

## 2019-08-18 DIAGNOSIS — Z79810 Long term (current) use of selective estrogen receptor modulators (SERMs): Secondary | ICD-10-CM | POA: Diagnosis not present

## 2019-08-18 DIAGNOSIS — Z170 Estrogen receptor positive status [ER+]: Secondary | ICD-10-CM | POA: Diagnosis not present

## 2019-08-18 DIAGNOSIS — Z510 Encounter for antineoplastic radiation therapy: Secondary | ICD-10-CM | POA: Diagnosis not present

## 2019-08-18 NOTE — Telephone Encounter (Signed)
Pt was returning therapist call regarding pt having not been to therapy in awhile. She is still going thru daily radiation treatments and reports doing okay for now. Would like to be on hold until end of radiation (end of December) and plans to call us at that time to let us know if she needs to resume therapy or is okay to D/C depending on how things go with radiation.

## 2019-08-19 ENCOUNTER — Ambulatory Visit
Admission: RE | Admit: 2019-08-19 | Discharge: 2019-08-19 | Disposition: A | Discharge status: Home / Self Care | Payer: 59 | Source: Ambulatory Visit | Attending: Radiation Oncology | Admitting: Radiation Oncology

## 2019-08-19 ENCOUNTER — Ambulatory Visit: Payer: 59 | Admitting: Radiation Oncology

## 2019-08-19 ENCOUNTER — Other Ambulatory Visit: Payer: Self-pay

## 2019-08-19 DIAGNOSIS — C50412 Malignant neoplasm of upper-outer quadrant of left female breast: Secondary | ICD-10-CM | POA: Diagnosis not present

## 2019-08-19 DIAGNOSIS — Z510 Encounter for antineoplastic radiation therapy: Secondary | ICD-10-CM | POA: Diagnosis not present

## 2019-08-19 DIAGNOSIS — Z170 Estrogen receptor positive status [ER+]: Secondary | ICD-10-CM | POA: Diagnosis not present

## 2019-08-19 DIAGNOSIS — Z79810 Long term (current) use of selective estrogen receptor modulators (SERMs): Secondary | ICD-10-CM | POA: Diagnosis not present

## 2019-08-19 DIAGNOSIS — Z79899 Other long term (current) drug therapy: Secondary | ICD-10-CM | POA: Diagnosis not present

## 2019-08-22 ENCOUNTER — Ambulatory Visit: Payer: 59

## 2019-08-22 ENCOUNTER — Ambulatory Visit
Admission: RE | Admit: 2019-08-22 | Discharge: 2019-08-22 | Disposition: A | Discharge status: Home / Self Care | Payer: 59 | Source: Ambulatory Visit | Attending: Radiation Oncology | Admitting: Radiation Oncology

## 2019-08-22 ENCOUNTER — Other Ambulatory Visit: Payer: Self-pay

## 2019-08-22 DIAGNOSIS — Z170 Estrogen receptor positive status [ER+]: Secondary | ICD-10-CM | POA: Diagnosis not present

## 2019-08-22 DIAGNOSIS — C50412 Malignant neoplasm of upper-outer quadrant of left female breast: Secondary | ICD-10-CM | POA: Diagnosis not present

## 2019-08-22 DIAGNOSIS — Z510 Encounter for antineoplastic radiation therapy: Secondary | ICD-10-CM | POA: Diagnosis not present

## 2019-08-22 DIAGNOSIS — Z79899 Other long term (current) drug therapy: Secondary | ICD-10-CM | POA: Diagnosis not present

## 2019-08-22 DIAGNOSIS — Z79810 Long term (current) use of selective estrogen receptor modulators (SERMs): Secondary | ICD-10-CM | POA: Diagnosis not present

## 2019-08-23 ENCOUNTER — Other Ambulatory Visit: Payer: Self-pay

## 2019-08-23 ENCOUNTER — Ambulatory Visit
Admission: RE | Admit: 2019-08-23 | Discharge: 2019-08-23 | Disposition: A | Discharge status: Home / Self Care | Payer: 59 | Source: Ambulatory Visit | Attending: Radiation Oncology | Admitting: Radiation Oncology

## 2019-08-23 ENCOUNTER — Ambulatory Visit: Payer: 59

## 2019-08-23 DIAGNOSIS — Z510 Encounter for antineoplastic radiation therapy: Secondary | ICD-10-CM | POA: Diagnosis not present

## 2019-08-23 DIAGNOSIS — C50412 Malignant neoplasm of upper-outer quadrant of left female breast: Secondary | ICD-10-CM | POA: Diagnosis not present

## 2019-08-23 DIAGNOSIS — Z170 Estrogen receptor positive status [ER+]: Secondary | ICD-10-CM | POA: Diagnosis not present

## 2019-08-23 DIAGNOSIS — Z79810 Long term (current) use of selective estrogen receptor modulators (SERMs): Secondary | ICD-10-CM | POA: Diagnosis not present

## 2019-08-23 DIAGNOSIS — Z79899 Other long term (current) drug therapy: Secondary | ICD-10-CM | POA: Diagnosis not present

## 2019-08-23 MED FILL — LOSARTAN POTASSIUM 50 MG TA: 50 | 30 days supply | Qty: 30 | Fill #5

## 2019-08-24 ENCOUNTER — Ambulatory Visit
Admission: RE | Admit: 2019-08-24 | Discharge: 2019-08-24 | Disposition: A | Discharge status: Home / Self Care | Payer: 59 | Source: Ambulatory Visit | Attending: Radiation Oncology | Admitting: Radiation Oncology

## 2019-08-24 ENCOUNTER — Other Ambulatory Visit: Payer: Self-pay

## 2019-08-24 DIAGNOSIS — Z79899 Other long term (current) drug therapy: Secondary | ICD-10-CM | POA: Diagnosis not present

## 2019-08-24 DIAGNOSIS — C50412 Malignant neoplasm of upper-outer quadrant of left female breast: Secondary | ICD-10-CM | POA: Diagnosis not present

## 2019-08-24 DIAGNOSIS — Z510 Encounter for antineoplastic radiation therapy: Secondary | ICD-10-CM | POA: Diagnosis not present

## 2019-08-24 DIAGNOSIS — Z170 Estrogen receptor positive status [ER+]: Secondary | ICD-10-CM | POA: Diagnosis not present

## 2019-08-24 DIAGNOSIS — Z79810 Long term (current) use of selective estrogen receptor modulators (SERMs): Secondary | ICD-10-CM | POA: Diagnosis not present

## 2019-08-25 ENCOUNTER — Ambulatory Visit
Admission: RE | Admit: 2019-08-25 | Discharge: 2019-08-25 | Disposition: A | Discharge status: Home / Self Care | Payer: 59 | Source: Ambulatory Visit | Attending: Radiation Oncology | Admitting: Radiation Oncology

## 2019-08-25 ENCOUNTER — Other Ambulatory Visit: Payer: Self-pay

## 2019-08-25 DIAGNOSIS — Z170 Estrogen receptor positive status [ER+]: Secondary | ICD-10-CM | POA: Diagnosis not present

## 2019-08-25 DIAGNOSIS — Z79810 Long term (current) use of selective estrogen receptor modulators (SERMs): Secondary | ICD-10-CM | POA: Diagnosis not present

## 2019-08-25 DIAGNOSIS — C50412 Malignant neoplasm of upper-outer quadrant of left female breast: Secondary | ICD-10-CM

## 2019-08-25 DIAGNOSIS — Z79899 Other long term (current) drug therapy: Secondary | ICD-10-CM | POA: Diagnosis not present

## 2019-08-25 DIAGNOSIS — Z510 Encounter for antineoplastic radiation therapy: Secondary | ICD-10-CM | POA: Diagnosis not present

## 2019-08-25 MED ORDER — SONAFINE EX EMUL
1.0000 "application " | Freq: Two times a day (BID) | CUTANEOUS | Status: DC
  Administered 2019-08-25: 1 via TOPICAL

## 2019-08-28 NOTE — Progress Notes (Signed)
Patient Care Team: Rutherford Guys, MD as PCP - General (Family Medicine) Mauro Kaufmann, RN as Oncology Nurse Navigator Rockwell Germany, RN as Oncology Nurse Navigator [name], MD as Consulting Physician (General Surgery) Nicholas Lose, MD as Consulting Physician (Hematology and Oncology) Kyung Rudd, MD as Consulting Physician (Radiation Oncology)  DIAGNOSIS:    ICD-10-CM   1. Malignant neoplasm of upper-outer quadrant of left breast in female, estrogen receptor positive ([place])  C50.412    Z17.0     SUMMARY OF ONCOLOGIC HISTORY: Oncology History  Malignant neoplasm of upper-outer quadrant of left breast in female, estrogen receptor positive ([place])  05/20/2019 Initial Diagnosis   Routine screening mammogram detected a 1.4cm left breast mass at the 2:30 position, no axillary adenopathy. 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    06/05/2019 Genetic Testing   Negative genetic testing. No pathogenic variants identified on the [facility] Common Hereditary Cancers Panel + STAT Breast Cancer panel.The STAT Breast cancer panel offered by [facility] includes sequencing and rearrangement analysis for the following 9 genes:  ATM, BRCA1, BRCA2, CDH1, CHEK2, PALB2, PTEN, STK11 and TP53. The Common Hereditary Cancers Panel offered by [facility] includes sequencing and/or deletion duplication testing of the following 47 genes: APC, ATM, AXIN2, BARD1, BMPR1A, BRCA1, BRCA2, BRIP1, CDH1, CDKN2A (p14ARF), CDKN2A (p16INK4a), CKD4, CHEK2, CTNNA1, DICER1, EPCAM (Deletion/duplication testing only), GREM1 (promoter region deletion/duplication testing only), KIT, MEN1, MLH1, MSH2, MSH3, MSH6, MUTYH, NBN, NF1, NHTL1, PALB2, PDGFRA, PMS2, POLD1, POLE, PTEN, RAD50, RAD51C, RAD51D, SDHB, SDHC, SDHD, SMAD4, SMARCA4. STK11, TP53, TSC1, TSC2, and VHL.  The following genes were evaluated for sequence changes only: SDHA and HOXB13 c.251G>A variant only. The report date is 06/05/2019.    06/14/2019 Surgery   Left lumpectomy [facility]): IDC, grade 2, with high grade DCIS, 1.7cm, clear margins, 2 left axillary lymph nodes negative for carcinoma.    06/24/2019 Oncotype testing   Recurrence score: 13; low risk, distant recurrence at 9 years: 4%   07/14/2019 -  Radiation Therapy   Adjuvant radiation     CHIEF COMPLIANT:  Follow-up of left breast cancer to discuss antiestrogen therapy  INTERVAL HISTORY: Erin Gallagher is a 48 y.o. with above-mentioned history of left breast cancer treated with left lumpectomy and who began radiation on 07/14/19. She presents to the clinic today for follow-up to discuss antiestrogen therapy.  Other than radiation dermatitis she has tolerated radiation fairly well.  REVIEW OF SYSTEMS:   Constitutional: Denies fevers, chills or abnormal weight loss Eyes: Denies blurriness of vision Ears, nose, mouth, throat, and face: Denies mucositis or sore throat Respiratory: Denies cough, dyspnea or wheezes Cardiovascular: Denies palpitation, chest discomfort Gastrointestinal: Denies nausea, heartburn or change in bowel habits Skin: Denies abnormal skin rashes Lymphatics: Denies new lymphadenopathy or easy bruising Neurological: Denies numbness, tingling or new weaknesses Behavioral/Psych: Mood is stable, no new changes  Extremities: No lower extremity edema Breast: denies any pain or lumps or nodules in either breasts All other systems were reviewed with the patient and are negative.  I have reviewed the past medical history, past surgical history, social history and family history with the patient and they are unchanged from previous note.  ALLERGIES:  is allergic to other.  MEDICATIONS:  Current Outpatient Medications  Medication Sig Dispense Refill  . amLODipine (NORVASC) 10 MG tablet Take 1 tablet (10 mg total) by mouth daily. 90 tablet 1  . cyclobenzaprine (FLEXERIL) 5 MG tablet 1 pill by  mouth up to every 8 hours as needed. Start with one  pill by mouth each bedtime as needed due to sedation 15 tablet 0  . losartan (COZAAR) 50 MG tablet Take 1 tablet (50 mg total) by mouth daily. 90 tablet 1  . oxyCODONE (OXY IR/ROXICODONE) 5 MG immediate release tablet Take 1 tablet (5 mg total) by mouth every 6 (six) hours as needed for severe pain. (Patient not taking: Reported on 06/29/2019) 15 tablet 0  . tamoxifen (NOLVADEX) 20 MG tablet Take 1 tablet (20 mg total) by mouth daily. 90 tablet 3   No current facility-administered medications for this visit.    PHYSICAL EXAMINATION: ECOG PERFORMANCE STATUS: 1 - Symptomatic but completely ambulatory  Vitals:   08/29/19 1000  BP: 140/74  Pulse: 81  Resp: 18  Temp: 98.4 F (36.9 C)  SpO2: 99%   Filed Weights   08/29/19 1000  Weight: 267 lb 12.8 oz (121.5 kg)    GENERAL: alert, no distress and comfortable SKIN: skin color, texture, turgor are normal, no rashes or significant lesions EYES: normal, Conjunctiva are pink and non-injected, sclera clear OROPHARYNX: no exudate, no erythema and lips, buccal mucosa, and tongue normal  NECK: supple, thyroid normal size, non-tender, without nodularity LYMPH: no palpable lymphadenopathy in the cervical, axillary or inguinal LUNGS: clear to auscultation and percussion with normal breathing effort HEART: regular rate & rhythm and no murmurs and no lower extremity edema ABDOMEN: abdomen soft, non-tender and normal bowel sounds MUSCULOSKELETAL: no cyanosis of digits and no clubbing  NEURO: alert & oriented x 3 with fluent speech, no focal motor/sensory deficits EXTREMITIES: No lower extremity edema  LABORATORY DATA:  I have reviewed the data as listed CMP Latest Ref Rng & Units 05/25/2019 03/08/2019 11/23/2017  Glucose 70 - 99 mg/dL 111(H) 90 94  BUN 6 - 20 mg/dL 9 7 8   Creatinine 0.44 - 1.00 mg/dL 0.75 0.65 0.73  Sodium 135 - 145 mmol/L 140 140 140  Potassium 3.5 - 5.1 mmol/L 4.1 4.3 4.5  Chloride 98 - 111 mmol/L 107 107(H) 106  CO2 22 - 32  mmol/L 26 19(L) 20  Calcium 8.9 - 10.3 mg/dL 8.9 8.8 8.5(L)  Total Protein 6.5 - 8.1 g/dL 7.1 6.6 7.0  Total Bilirubin 0.3 - 1.2 mg/dL 0.3 0.5 <0.2  Alkaline Phos 38 - 126 U/L 65 65 61  AST 15 - 41 U/L 14(L) 16 18  ALT 0 - 44 U/L 13 14 16     Lab Results  Component Value Date   WBC 7.0 05/25/2019   HGB 13.7 05/25/2019   HCT 41.8 05/25/2019   MCV 82.8 05/25/2019   PLT 344 05/25/2019   NEUTROABS 4.6 05/25/2019    ASSESSMENT & PLAN:  Malignant neoplasm of upper-outer quadrant of left breast in female, estrogen receptor positive ([place]) 05/20/2019:Routine screening mammogram detected a 1.4cm left breast mass at the 2:30 position, no axillary adenopathy. 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 T1CN0 stage Ia clinical stage  06/14/2019: Left lumpectomy: Grade 2 IDC, 1.7 cm, high-grade DCIS, margins negative, no lymphovascular or perineural invasion, 0/2 lymph nodes negative, ER 100%, PR 100%, HER-2 negative, Ki-67 10% Oncotype DX score: 13, low risk, risk of distant recurrence at 9 years 4% Genetic testing: Negative Adjuvant radiation therapy: 07/14/2019-08/29/2019  Recommendation: Adjuvant antiestrogen therapy with tamoxifen 20 mg daily, could do switch to aromatase inhibitor once she is menopausal  Tamoxifen counseling:We discussed the risks and benefits of tamoxifen.  These include but not limited to insomnia, hot flashes, mood changes, vaginal dryness, and weight gain. Although rare, serious side effects including endometrial cancer, risk of blood clots were also discussed. We strongly believe that the benefits far outweigh the risks. Patient understands these risks and consented to starting treatment. Planned treatment duration is 10 years.  Return to clinic in 3 months for survivorship care plan visit     No orders of the defined types were placed in this encounter.  The patient has a good understanding of the overall plan. she agrees  with it. she will call with any problems that may develop before the next visit here.  Nicholas Lose, MD 08/29/2019  Julious Oka Dorshimer, am acting as scribe for Dr. Nicholas Lose.  I have reviewed the above document for accuracy and completeness, and I agree with the above.

## 2019-08-29 ENCOUNTER — Telehealth: Payer: Self-pay | Admitting: Hematology and Oncology

## 2019-08-29 ENCOUNTER — Other Ambulatory Visit: Payer: Self-pay

## 2019-08-29 ENCOUNTER — Ambulatory Visit
Admission: RE | Admit: 2019-08-29 | Discharge: 2019-08-29 | Disposition: A | Discharge status: Home / Self Care | Payer: 59 | Source: Ambulatory Visit | Attending: Radiation Oncology | Admitting: Radiation Oncology

## 2019-08-29 ENCOUNTER — Ambulatory Visit: Payer: 59

## 2019-08-29 ENCOUNTER — Inpatient Hospital Stay: Payer: 59 | Attending: Hematology and Oncology | Admitting: Hematology and Oncology

## 2019-08-29 DIAGNOSIS — Z170 Estrogen receptor positive status [ER+]: Secondary | ICD-10-CM | POA: Diagnosis not present

## 2019-08-29 DIAGNOSIS — C50412 Malignant neoplasm of upper-outer quadrant of left female breast: Secondary | ICD-10-CM | POA: Diagnosis not present

## 2019-08-29 DIAGNOSIS — Z79899 Other long term (current) drug therapy: Secondary | ICD-10-CM | POA: Diagnosis not present

## 2019-08-29 DIAGNOSIS — Z510 Encounter for antineoplastic radiation therapy: Secondary | ICD-10-CM | POA: Diagnosis not present

## 2019-08-29 DIAGNOSIS — Z79810 Long term (current) use of selective estrogen receptor modulators (SERMs): Secondary | ICD-10-CM | POA: Diagnosis not present

## 2019-08-29 MED ORDER — TAMOXIFEN CITRATE 20 MG PO TABS: 20.0000 mg | ORAL_TABLET | Freq: Every day | ORAL | 3 refills | Status: DC

## 2019-08-29 MED FILL — TAMOXIFEN 20 MG TABLET: 20 | 90 days supply | Qty: 90 | Fill #0

## 2019-08-29 NOTE — Assessment & Plan Note (Signed)
05/20/2019:Routine screening mammogram detected a 1.4cm left breast mass at the 2:30 position, no axillary adenopathy. 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 T1CN0 stage Ia clinical stage  06/14/2019: Left lumpectomy: Grade 2 IDC, 1.7 cm, high-grade DCIS, margins negative, no lymphovascular or perineural invasion, 0/2 lymph nodes negative, ER 100%, PR 100%, HER-2 negative, Ki-67 10% Oncotype DX score: 13, low risk, risk of distant recurrence at 9 years 4% Genetic testing: Negative Adjuvant radiation therapy: 07/14/2019-08/29/2019  Recommendation: Adjuvant antiestrogen therapy with tamoxifen 20 mg daily, could do switch to aromatase inhibitor once she is menopausal  Tamoxifen counseling:We discussed the risks and benefits of tamoxifen. These include but not limited to insomnia, hot flashes, mood changes, vaginal dryness, and weight gain. Although rare, serious side effects including endometrial cancer, risk of blood clots were also discussed. We strongly believe that the benefits far outweigh the risks. Patient understands these risks and consented to starting treatment. Planned treatment duration is 10 years.  Return to clinic in 3 months for survivorship care plan visit

## 2019-08-29 NOTE — Telephone Encounter (Signed)
Scheduled per 12/28 los, patient is notified. 

## 2019-08-30 ENCOUNTER — Ambulatory Visit: Payer: 59

## 2019-08-30 DIAGNOSIS — Z170 Estrogen receptor positive status [ER+]: Secondary | ICD-10-CM | POA: Diagnosis not present

## 2019-08-30 DIAGNOSIS — C50412 Malignant neoplasm of upper-outer quadrant of left female breast: Secondary | ICD-10-CM | POA: Diagnosis not present

## 2019-08-30 DIAGNOSIS — Z510 Encounter for antineoplastic radiation therapy: Secondary | ICD-10-CM | POA: Diagnosis not present

## 2019-08-30 DIAGNOSIS — Z79810 Long term (current) use of selective estrogen receptor modulators (SERMs): Secondary | ICD-10-CM | POA: Diagnosis not present

## 2019-08-30 DIAGNOSIS — Z79899 Other long term (current) drug therapy: Secondary | ICD-10-CM | POA: Diagnosis not present

## 2019-08-31 ENCOUNTER — Encounter: Payer: Self-pay | Admitting: Radiation Oncology

## 2019-08-31 ENCOUNTER — Other Ambulatory Visit: Payer: Self-pay

## 2019-08-31 ENCOUNTER — Encounter: Payer: Self-pay | Admitting: *Deleted

## 2019-08-31 ENCOUNTER — Ambulatory Visit
Admission: RE | Admit: 2019-08-31 | Discharge: 2019-08-31 | Disposition: A | Discharge status: Home / Self Care | Payer: 59 | Source: Ambulatory Visit | Attending: Radiation Oncology | Admitting: Radiation Oncology

## 2019-08-31 DIAGNOSIS — Z170 Estrogen receptor positive status [ER+]: Secondary | ICD-10-CM | POA: Diagnosis not present

## 2019-08-31 DIAGNOSIS — Z79810 Long term (current) use of selective estrogen receptor modulators (SERMs): Secondary | ICD-10-CM | POA: Diagnosis not present

## 2019-08-31 DIAGNOSIS — Z79899 Other long term (current) drug therapy: Secondary | ICD-10-CM | POA: Diagnosis not present

## 2019-08-31 DIAGNOSIS — C50412 Malignant neoplasm of upper-outer quadrant of left female breast: Secondary | ICD-10-CM | POA: Diagnosis not present

## 2019-08-31 DIAGNOSIS — Z510 Encounter for antineoplastic radiation therapy: Secondary | ICD-10-CM | POA: Diagnosis not present

## 2019-09-08 ENCOUNTER — Other Ambulatory Visit: Payer: Self-pay

## 2019-09-08 ENCOUNTER — Ambulatory Visit: Payer: 59 | Admitting: Family Medicine

## 2019-09-08 ENCOUNTER — Encounter: Payer: Self-pay | Admitting: Family Medicine

## 2019-09-08 VITALS — BP 140/86 | HR 89 | Temp 98.9°F | Ht 66.0 in | Wt 265.0 lb

## 2019-09-08 DIAGNOSIS — M25511 Pain in right shoulder: Secondary | ICD-10-CM | POA: Diagnosis not present

## 2019-09-08 DIAGNOSIS — Z6841 Body Mass Index (BMI) 40.0 and over, adult: Secondary | ICD-10-CM

## 2019-09-08 DIAGNOSIS — Z170 Estrogen receptor positive status [ER+]: Secondary | ICD-10-CM | POA: Diagnosis not present

## 2019-09-08 DIAGNOSIS — C50412 Malignant neoplasm of upper-outer quadrant of left female breast: Secondary | ICD-10-CM

## 2019-09-08 DIAGNOSIS — I10 Essential (primary) hypertension: Secondary | ICD-10-CM

## 2019-09-08 MED ORDER — LOSARTAN POTASSIUM 50 MG PO TABS: 50.0000 mg | ORAL_TABLET | Freq: Every day | ORAL | 1 refills | Status: DC

## 2019-09-08 MED ORDER — CYCLOBENZAPRINE HCL 5 MG PO TABS: ORAL_TABLET | ORAL | 2 refills | Status: DC

## 2019-09-08 MED ORDER — AMLODIPINE BESYLATE 10 MG PO TABS: 10.0000 mg | ORAL_TABLET | Freq: Every day | ORAL | 1 refills | Status: DC

## 2019-09-08 MED FILL — CYCLOBENZAPRINE HCL 5 MG TA: 5 | 10 days supply | Qty: 30 | Fill #0

## 2019-09-08 MED FILL — AMLODIPINE BESYLATE 10 MG T: 10 | 90 days supply | Qty: 90 | Fill #0

## 2019-09-08 NOTE — Progress Notes (Signed)
1/7/20219:41 AM  Erin Gallagher 05/12/1971, 49 y.o., female [id number]  Chief Complaint  Patient presents with  . Hypertension    HPI:   Patient is a 49 y.o. female with past medical history significant for HTN who presents today for routine followup  Last OV July 2020 for CPE - restarted BP meds Since then diagnosed with LEFT breast cancer, s/p lumpectomy with radiation therapy, last OV Dec 2020 -started on tamoxifen  She is overall doing well She is taking her meds as prescribed She does not check BP at home She has been trying to walk more and eat healthier She uses flexeril prn for low back and right shoulder pain, takes mostly at night as it makes her sleepy She has no acute concerns today  Depression screen [facility] 2/9 09/08/2019 03/08/2019 06/17/2018  Decreased Interest 0 0 0  Down, Depressed, Hopeless 0 0 0  PHQ - 2 Score 0 0 0    Fall Risk  09/08/2019 03/08/2019 06/17/2018 04/30/2018 04/28/2018  Falls in the past year? 0 0 No No No  Number falls in past yr: 0 0 - - -  Injury with Fall? 0 0 - - -     Allergies  Allergen Reactions  . Other Rash    Steri Strips cause itching and a rash    Prior to Admission medications   Medication Sig Start Date End Date Taking? Authorizing Provider  amLODipine (NORVASC) 10 MG tablet Take 1 tablet (10 mg total) by mouth daily. 03/08/19   Rutherford Guys, MD  cyclobenzaprine (FLEXERIL) 5 MG tablet 1 pill by mouth up to every 8 hours as needed. Start with one pill by mouth each bedtime as needed due to sedation 06/17/18   Wendie Agreste, MD  losartan (COZAAR) 50 MG tablet Take 1 tablet (50 mg total) by mouth daily. 03/08/19   Rutherford Guys, MD  oxyCODONE (OXY IR/ROXICODONE) 5 MG immediate release tablet Take 1 tablet (5 mg total) by mouth every 6 (six) hours as needed for severe pain. Patient not taking: Reported on 06/29/2019 06/14/19   [name], MD  tamoxifen (NOLVADEX) 20 MG tablet Take 1 tablet (20 mg total) by mouth daily.  08/29/19   Nicholas Lose, MD    Past Medical History:  Diagnosis Date  . Arthritis   . Family history of breast cancer   . Family history of colon cancer   . Family history of lung cancer   . Family history of throat cancer   . Gall bladder disease    gall bladder removal  . Hypertension     Past Surgical History:  Procedure Laterality Date  . BREAST LUMPECTOMY WITH RADIOACTIVE SEED AND SENTINEL LYMPH NODE BIOPSY Left 06/14/2019   Procedure: LEFT BREAST LUMPECTOMY WITH RADIOACTIVE SEED AND SENTINEL LYMPH NODE BIOPSY;  Surgeon: [name], MD;  Location: [place];  Service: General;  Laterality: Left;  . CESAREAN SECTION    . CHOLECYSTECTOMY    . TUBAL LIGATION      Social History   Tobacco Use  . Smoking status: Never Smoker  . Smokeless tobacco: Never Used  Substance Use Topics  . Alcohol use: Yes    Alcohol/week: 2.0 standard drinks    Types: 2 Cans of beer per week    Comment: occasionally    Family History  Problem Relation Age of Onset  . Hypertension Mother   . Heart disease Father   . Hypertension Father   . Colon cancer  Father        dx 51s  . Stroke Maternal Grandmother   . Hypertension Maternal Grandmother   . Breast cancer Maternal Aunt        dx 78s  . Throat cancer Maternal Uncle   . Cancer Maternal Uncle   . Lung cancer Paternal Uncle   . Stomach cancer Neg Hx   . Esophageal cancer Neg Hx   . Rectal cancer Neg Hx   . Liver cancer Neg Hx     Review of Systems  Constitutional: Negative for chills and fever.  Respiratory: Negative for cough and shortness of breath.   Cardiovascular: Negative for chest pain, palpitations and leg swelling.  Gastrointestinal: Negative for abdominal pain, nausea and vomiting.     OBJECTIVE:  Today's Vitals   09/08/19 0926  BP: 140/86  Pulse: 89  Temp: 98.9 F (37.2 C)  SpO2: 95%  Weight: 265 lb (120.2 kg)  Height: 5\' 6"  (1.676 m)   Body mass index is 42.77 kg/m.  BP Readings  from Last 3 Encounters:  09/08/19 140/86  08/29/19 140/74  06/22/19 132/71   Wt Readings from Last 3 Encounters:  09/08/19 265 lb (120.2 kg)  08/29/19 267 lb 12.8 oz (121.5 kg)  06/22/19 255 lb 1.6 oz (115.7 kg)    Physical Exam Vitals and nursing note reviewed.  Constitutional:      Appearance: She is well-developed.  HENT:     Head: Normocephalic and atraumatic.     Mouth/Throat:     Pharynx: No oropharyngeal exudate.  Eyes:     General: No scleral icterus.    Conjunctiva/sclera: Conjunctivae normal.     Pupils: Pupils are equal, round, and reactive to light.  Cardiovascular:     Rate and Rhythm: Normal rate and regular rhythm.     Heart sounds: Normal heart sounds. No murmur. No friction rub. No gallop.   Pulmonary:     Effort: Pulmonary effort is normal.     Breath sounds: Normal breath sounds. No wheezing or rales.  Musculoskeletal:     Cervical back: Neck supple.  Skin:    General: Skin is warm and dry.  Neurological:     Mental Status: She is alert and oriented to person, place, and time.     No results found for this or any previous visit (from the past 24 hour(s)).  No results found.   ASSESSMENT and PLAN  1. Essential hypertension Controlled. Continue current regime.  - Lipid panel - CMET with GFR  2. BMI 40.0-44.9, adult (H[facility]) Discussed LFM  3. Malignant neoplasm of upper-outer quadrant of left breast in female, estrogen receptor positive ([place]) Under med onc care, emotionally coping well  4. Pain in joint of right shoulder Stable. Cont flexeril prn - cyclobenzaprine (FLEXERIL) 5 MG tablet; 1 pill by mouth up to every 8 hours as needed.  Other orders - amLODipine (NORVASC) 10 MG tablet; Take 1 tablet (10 mg total) by mouth daily. - losartan (COZAAR) 50 MG tablet; Take 1 tablet (50 mg total) by mouth daily.  Return in about 6 months (around 03/07/2020).    Rutherford Guys, MD [facility] [place], [place]  [address] Ph.  [phone] Fax [phone]

## 2019-09-08 NOTE — Patient Instructions (Addendum)
If you have lab work done today you will be contacted with your lab results within the next 2 weeks.  If you have not heard from [place] then please contact us. The fastest way to get your results is to register for My Chart.   IF you received an x-ray today, you will receive an invoice from [facility]. Please contact [facility] at [phone] with questions or concerns regarding your invoice.   IF you received labwork today, you will receive an invoice from [facility]. Please contact [facility] at [phone] with questions or concerns regarding your invoice.   Our billing staff will not be able to assist you with questions regarding bills from these companies.  You will be contacted with the lab results as soon as they are available. The fastest way to get your results is to activate your My Chart account. Instructions are located on the last page of this paperwork. If you have not heard from [place] regarding the results in 2 weeks, please contact this office.     Calorie Counting for Weight Loss Calories are units of energy. Your body needs a certain amount of calories from food to keep you going throughout the day. When you eat more calories than your body needs, your body stores the extra calories as fat. When you eat fewer calories than your body needs, your body burns fat to get the energy it needs. Calorie counting means keeping track of how many calories you eat and drink each day. Calorie counting can be helpful if you need to lose weight. If you make sure to eat fewer calories than your body needs, you should lose weight. Ask your health care provider what a healthy weight is for you. For calorie counting to work, you will need to eat the right number of calories in a day in order to lose a healthy amount of weight per week. A dietitian can help you determine how many calories you need in a day and will give you suggestions on how to reach your calorie goal.  A healthy  amount of weight to lose per week is usually 1-2 lb (0.5-0.9 kg). This usually means that your daily calorie intake should be reduced by 500-750 calories.  Eating 1,200 - 1,500 calories per day can help most women lose weight.  Eating 1,500 - 1,800 calories per day can help most men lose weight. What is my plan? My goal is to have __1600________ calories per day. If I have this many calories per day, I should lose around ___1_______ pounds per week. What do I need to know about calorie counting? In order to meet your daily calorie goal, you will need to:  Find out how many calories are in each food you would like to eat. Try to do this before you eat.  Decide how much of the food you plan to eat.  Write down what you ate and how many calories it had. Doing this is called keeping a food log. To successfully lose weight, it is important to balance calorie counting with a healthy lifestyle that includes regular activity. Aim for 150 minutes of moderate exercise (such as walking) or 75 minutes of vigorous exercise (such as running) each week. Where do I find calorie information?  The number of calories in a food can be found on a Nutrition Facts label. If a food does not have a Nutrition Facts label, try to look up the calories online or ask your dietitian for  help. Remember that calories are listed per serving. If you choose to have more than one serving of a food, you will have to multiply the calories per serving by the amount of servings you plan to eat. For example, the label on a package of bread might say that a serving size is 1 slice and that there are 90 calories in a serving. If you eat 1 slice, you will have eaten 90 calories. If you eat 2 slices, you will have eaten 180 calories. How do I keep a food log? Immediately after each meal, record the following information in your food log:  What you ate. Don't forget to include toppings, sauces, and other extras on the food.  How much  you ate. This can be measured in cups, ounces, or number of items.  How many calories each food and drink had.  The total number of calories in the meal. Keep your food log near you, such as in a small notebook in your pocket, or use a mobile app or website. Some programs will calculate calories for you and show you how many calories you have left for the day to meet your goal. What are some calorie counting tips?   Use your calories on foods and drinks that will fill you up and not leave you hungry: ? Some examples of foods that fill you up are nuts and nut butters, vegetables, lean proteins, and high-fiber foods like whole grains. High-fiber foods are foods with more than 5 g fiber per serving. ? Drinks such as sodas, specialty coffee drinks, alcohol, and juices have a lot of calories, yet do not fill you up.  Eat nutritious foods and avoid empty calories. Empty calories are calories you get from foods or beverages that do not have many vitamins or protein, such as candy, sweets, and soda. It is better to have a nutritious high-calorie food (such as an avocado) than a food with few nutrients (such as a bag of chips).  Know how many calories are in the foods you eat most often. This will help you calculate calorie counts faster.  Pay attention to calories in drinks. Low-calorie drinks include water and unsweetened drinks.  Pay attention to nutrition labels for "low fat" or "fat free" foods. These foods sometimes have the same amount of calories or more calories than the full fat versions. They also often have added sugar, starch, or salt, to make up for flavor that was removed with the fat.  Find a way of tracking calories that works for you. Get creative. Try different apps or programs if writing down calories does not work for you. What are some portion control tips?  Know how many calories are in a serving. This will help you know how many servings of a certain food you can have.  Use a  measuring cup to measure serving sizes. You could also try weighing out portions on a kitchen scale. With time, you will be able to estimate serving sizes for some foods.  Take some time to put servings of different foods on your favorite plates, bowls, and cups so you know what a serving looks like.  Try not to eat straight from a bag or box. Doing this can lead to overeating. Put the amount you would like to eat in a cup or on a plate to make sure you are eating the right portion.  Use smaller plates, glasses, and bowls to prevent overeating.  Try not to multitask (for example,  watch TV or use your computer) while eating. If it is time to eat, sit down at a table and enjoy your food. This will help you to know when you are full. It will also help you to be aware of what you are eating and how much you are eating. What are tips for following this plan? Reading food labels  Check the calorie count compared to the serving size. The serving size may be smaller than what you are used to eating.  Check the source of the calories. Make sure the food you are eating is high in vitamins and protein and low in saturated and trans fats. Shopping  Read nutrition labels while you shop. This will help you make healthy decisions before you decide to purchase your food.  Make a grocery list and stick to it. Cooking  Try to cook your favorite foods in a healthier way. For example, try baking instead of frying.  Use low-fat dairy products. Meal planning  Use more fruits and vegetables. Half of your plate should be fruits and vegetables.  Include lean proteins like poultry and fish. How do I count calories when eating out?  Ask for smaller portion sizes.  Consider sharing an entree and sides instead of getting your own entree.  If you get your own entree, eat only half. Ask for a box at the beginning of your meal and put the rest of your entree in it so you are not tempted to eat it.  If calories  are listed on the menu, choose the lower calorie options.  Choose dishes that include vegetables, fruits, whole grains, low-fat dairy products, and lean protein.  Choose items that are boiled, broiled, grilled, or steamed. Stay away from items that are buttered, battered, fried, or served with cream sauce. Items labeled "crispy" are usually fried, unless stated otherwise.  Choose water, low-fat milk, unsweetened iced tea, or other drinks without added sugar. If you want an alcoholic beverage, choose a lower calorie option such as a glass of wine or light beer.  Ask for dressings, sauces, and syrups on the side. These are usually high in calories, so you should limit the amount you eat.  If you want a salad, choose a garden salad and ask for grilled meats. Avoid extra toppings like bacon, cheese, or fried items. Ask for the dressing on the side, or ask for olive oil and vinegar or lemon to use as dressing.  Estimate how many servings of a food you are given. For example, a serving of cooked rice is  cup or about the size of half a baseball. Knowing serving sizes will help you be aware of how much food you are eating at restaurants. The list below tells you how big or small some common portion sizes are based on everyday objects: ? 1 oz--4 stacked dice. ? 3 oz--1 deck of cards. ? 1 tsp--1 die. ? 1 Tbsp-- a ping-pong ball. ? 2 Tbsp--1 ping-pong ball. ?  cup-- baseball. ? 1 cup--1 baseball. Summary  Calorie counting means keeping track of how many calories you eat and drink each day. If you eat fewer calories than your body needs, you should lose weight.  A healthy amount of weight to lose per week is usually 1-2 lb (0.5-0.9 kg). This usually means reducing your daily calorie intake by 500-750 calories.  The number of calories in a food can be found on a Nutrition Facts label. If a food does not have a Nutrition Facts   label, try to look up the calories online or ask your dietitian for  help.  Use your calories on foods and drinks that will fill you up, and not on foods and drinks that will leave you hungry.  Use smaller plates, glasses, and bowls to prevent overeating. This information is not intended to replace advice given to you by your health care provider. Make sure you discuss any questions you have with your health care provider. Document Revised: 05/07/2018 Document Reviewed: 07/18/2016 Elsevier Patient Education  [place].

## 2019-09-09 LAB — CMP14+EGFR
ALT: 19 IU/L (ref 0–32)
AST: 16 IU/L (ref 0–40)
Albumin/Globulin Ratio: 1.5 (ref 1.2–2.2)
Albumin: 3.9 g/dL (ref 3.8–4.8)
Alkaline Phosphatase: 73 IU/L (ref 39–117)
BUN/Creatinine Ratio: 13 (ref 9–23)
BUN: 9 mg/dL (ref 6–24)
Bilirubin Total: <0.2 mg/dL (ref 0.0–1.2)
CO2: 20 mmol/L (ref 20–29)
Calcium: 8.9 mg/dL (ref 8.7–10.2)
Chloride: 105 mmol/L (ref 96–106)
Creatinine, Ser: 0.69 mg/dL (ref 0.57–1.00)
GFR calc Af Amer: 118 mL/min/{1.73_m2} (ref >59)
GFR calc non Af Amer: 103 mL/min/{1.73_m2} (ref >59)
Globulin, Total: 2.6 g/dL (ref 1.5–4.5)
Glucose: 131 mg/dL — ABNORMAL HIGH (ref 65–99)
Potassium: 3.8 mmol/L (ref 3.5–5.2)
Sodium: 138 mmol/L (ref 134–144)
Total Protein: 6.5 g/dL (ref 6.0–8.5)

## 2019-09-09 LAB — LIPID PANEL
Chol/HDL Ratio: 3.1 ratio (ref 0.0–4.4)
Cholesterol, Total: 193 mg/dL (ref 100–199)
HDL: 62 mg/dL (ref >39)
LDL Chol Calc (NIH): 113 mg/dL — ABNORMAL HIGH (ref 0–99)
Triglycerides: 103 mg/dL (ref 0–149)
VLDL Cholesterol Cal: 18 mg/dL (ref 5–40)

## 2019-09-23 DIAGNOSIS — C50412 Malignant neoplasm of upper-outer quadrant of left female breast: Secondary | ICD-10-CM | POA: Diagnosis not present

## 2019-09-23 DIAGNOSIS — I890 Lymphedema, not elsewhere classified: Secondary | ICD-10-CM | POA: Diagnosis not present

## 2019-09-23 DIAGNOSIS — Z170 Estrogen receptor positive status [ER+]: Secondary | ICD-10-CM | POA: Diagnosis not present

## 2019-09-26 ENCOUNTER — Telehealth: Payer: Self-pay | Admitting: Radiation Oncology

## 2019-09-26 NOTE — Telephone Encounter (Signed)
  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  Interval Since Last Radiation:  4 weeks   07/13/2019-08/31/2019: The left breast was treated to 50.4 Gy in 28 fractions, followed by a 10 Gy boost in 5 fractions.  Narrative:  The patient was contacted today for routine follow-up. During treatment she did very well with radiotherapy and did not have significant desquamation.    Impression/Plan: 1. 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 I had to leave the patient a VM but I discussed that we would be happy to continue to follow her as needed, and she will also continue to follow up with Dr. Lindi Adie in medical oncology. She was counseled on skin care as well as measures to avoid sun exposure to this area.  2. Survivorship. I explained the importance of survivorship evaluation which she is scheduled for and recommended that she attend.     Carola Rhine, PAC

## 2019-09-28 ENCOUNTER — Ambulatory Visit: Payer: 59 | Attending: General Surgery

## 2019-09-28 ENCOUNTER — Other Ambulatory Visit: Payer: Self-pay

## 2019-09-28 DIAGNOSIS — Z483 Aftercare following surgery for neoplasm: Secondary | ICD-10-CM | POA: Diagnosis not present

## 2019-09-28 DIAGNOSIS — R293 Abnormal posture: Secondary | ICD-10-CM | POA: Insufficient documentation

## 2019-09-28 DIAGNOSIS — R600 Localized edema: Secondary | ICD-10-CM | POA: Diagnosis not present

## 2019-09-28 DIAGNOSIS — Z170 Estrogen receptor positive status [ER+]: Secondary | ICD-10-CM | POA: Diagnosis not present

## 2019-09-28 DIAGNOSIS — M25612 Stiffness of left shoulder, not elsewhere classified: Secondary | ICD-10-CM | POA: Diagnosis not present

## 2019-09-28 DIAGNOSIS — M25611 Stiffness of right shoulder, not elsewhere classified: Secondary | ICD-10-CM | POA: Insufficient documentation

## 2019-09-28 DIAGNOSIS — C50412 Malignant neoplasm of upper-outer quadrant of left female breast: Secondary | ICD-10-CM | POA: Diagnosis not present

## 2019-09-28 NOTE — Therapy (Signed)
[place], [place], [address] Phone: [phone]   Fax:  [phone]  Physical Therapy Re-Evaluation  Patient Details  Name: Erin Gallagher MRN: [id number] Date of Birth: [birth year] Referring Provider (PT): Dr. [name]   Encounter Date: 09/28/2019  PT End of Session - 09/28/19 1425    Visit Number  6    Number of Visits  12    Date for PT Re-Evaluation  11/16/19    PT Start Time  5277    PT Stop Time  1510    PT Time Calculation (min)  55 min    Activity Tolerance  Patient tolerated treatment well    Behavior During Therapy  [facility] for tasks assessed/performed       Past Medical History:  Diagnosis Date  . Arthritis   . Family history of breast cancer   . Family history of colon cancer   . Family history of lung cancer   . Family history of throat cancer   . Gall bladder disease    gall bladder removal  . Hypertension     Past Surgical History:  Procedure Laterality Date  . BREAST LUMPECTOMY WITH RADIOACTIVE SEED AND SENTINEL LYMPH NODE BIOPSY Left 06/14/2019   Procedure: LEFT BREAST LUMPECTOMY WITH RADIOACTIVE SEED AND SENTINEL LYMPH NODE BIOPSY;  Surgeon: [name], MD;  Location: [facility];  Service: General;  Laterality: Left;  . CESAREAN SECTION    . CHOLECYSTECTOMY    . TUBAL LIGATION      There were no vitals filed for this visit.  Subjective Assessment - 09/28/19 1420    Subjective  Pt reports to physical therapy after being on hold for over 2 months. She states that she finished radiation on 08/31/19. She states that her swelling and mobility in her L shoulder are still about the same. She reports that she had some dryness/flaking in her L breast from the radiation but was not significantly tender in this area. She has started Tamoxifen. She states that she has had her L breast drained 3 times due to edema. Pt reports that she is still performing the exercises  she was given when she was in physical therapy last 3x/week.    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 Patient undewent a left lumpectomy and sentinel node biopsy (0/3 axillary nodes positive) on 06/14/2019. It is ER/PR positive and HER2 negative with a Ki67 of 10%.    Patient Stated Goals  I want to manage my pain and get the swelling down.    Currently in Pain?  Yes    Pain Score  6     Pain Location  Breast    Pain Orientation  Left    Pain Descriptors / Indicators  Stabbing;Tightness;Aching    Pain Type  Surgical pain    Pain Radiating Towards  shoulder and shoulder blade    Pain Onset  More than a month ago    Pain Frequency  Constant    Aggravating Factors   aching all the time, sharp/stabbing, stretching, reaching    Pain Relieving Factors  rest    Effect of Pain on Daily Activities  Pt reports that she is not doing as much due to pain in her L shoulder.    Multiple Pain Sites  No         [facility] PT Assessment - 09/28/19 0001  ROM / Strength   AROM / PROM / Strength  AROM      AROM   AROM Assessment Site  Shoulder    Right/Left Shoulder  Left;Right    Right Shoulder Flexion  150 Degrees    Right Shoulder ABduction  112 Degrees    Right Shoulder Internal Rotation  70 Degrees    Right Shoulder External Rotation  75 Degrees    Left Shoulder Flexion  143 Degrees    Left Shoulder ABduction  110 Degrees    Left Shoulder Internal Rotation  70 Degrees    Left Shoulder External Rotation  69 Degrees        LYMPHEDEMA/ONCOLOGY QUESTIONNAIRE - 09/28/19 1429      Type   Cancer Type  L breast cancer      Surgeries   Lumpectomy Date  06/14/19    Sentinel Lymph Node Biopsy Date  06/14/19      Treatment   Active Chemotherapy Treatment  No    Past Chemotherapy Treatment  No    Active Radiation Treatment  No    Past Radiation Treatment  Yes    Date  08/31/19    Body Site  L breast and axilla     Current Hormone Treatment  Yes    Date  09/18/19    Drug Name  Tamoxifen     Past Hormone Therapy  No      What other symptoms do you have   Are you Having Heaviness or Tightness  Yes    Are you having Pain  Yes    Are you having pitting edema  No    Is it Hard or Difficult finding clothes that fit  No    Do you have infections  No    Is there Decreased scar mobility  Yes      Lymphedema Stage   Stage  STAGE 1 SPONTANEOUSLY REVERSIBLE      Lymphedema Assessments   Lymphedema Assessments  Upper extremities      Right Upper Extremity Lymphedema   10 cm Proximal to Olecranon Process  39.3 cm    Olecranon Process  26.7 cm    10 cm Proximal to Ulnar Styloid Process  22.7 cm    Just Proximal to Ulnar Styloid Process  16.6 cm    Across Hand at [employer]  18.7 cm    At [place] of 2nd Digit  6 cm      Left Upper Extremity Lymphedema   10 cm Proximal to Olecranon Process  38 cm    Olecranon Process  26.9 cm    10 cm Proximal to Ulnar Styloid Process  22.8 cm    Just Proximal to Ulnar Styloid Process  17 cm    Across Hand at [employer]  18.4 cm    At [place] of 2nd Digit  5.9 cm        Quick Dash - 09/28/19 0001    Open a tight or new jar  Moderate difficulty    Do heavy household chores (wash walls, wash floors)  Moderate difficulty    Carry a shopping bag or briefcase  Mild difficulty    Wash your back  Moderate difficulty    Use a knife to cut food  Mild difficulty    Recreational activities in which you take some force or impact through your arm, shoulder, or hand (golf, hammering, tennis)  Moderate difficulty    During the past week, to what extent  has your arm, shoulder or hand problem interfered with your normal social activities with family, friends, neighbors, or groups?  Slightly    During the past week, to what extent has your arm, shoulder or hand problem limited your work or other regular daily activities  Modererately    Arm, shoulder, or hand pain.  Extreme     Tingling (pins and needles) in your arm, shoulder, or hand  Mild    Difficulty Sleeping  Moderate difficulty    DASH Score  45.45 %             [facility] Adult PT Treatment/Exercise - 09/28/19 0001      Exercises   Exercises  Shoulder      Shoulder Exercises: Standing   Flexion  AAROM;Both;10 reps    Flexion Limitations  5 second hold, demonstration for correct movement and VC to avoid pain.     ABduction  AAROM;Right;Left;10 reps    ABduction Limitations  5 second hold with hand on wall side bending hips away for contralateral trunk stretch, unilateral on side of wall shoulder abduction stretch demonstration and VC to avoid pain especially when stretching the L trunk due to pt felt stretching up into the L breast due to fascial adhesions.       Manual Therapy   Manual Therapy  Manual Lymphatic Drainage (MLD)    Manual Lymphatic Drainage (MLD)  Pt was educated on self MLD of the L breast with step by step demonstration and return demonstration: In supine: short neck, 5 diaphragmatic breathes, BIl axillary nodes, L inguinal nodes, established the L axillo-inguinal anastomosis then the anterior-inter-axillary anastomosis, superior L breast toward the anterior-inter-axillary anastomosis repeat this anastomosis 5x, then inferior breast toward the L axillo-inguinal anastomosis then repeat this anastomosis 5x, re-worked all surfaces with an emphasis on direciton, skin stretch and pressure this session.              PT Education - 09/28/19 1544    Education Details  Pt was educated on self MLD with an emphasis on skin stretch, direction and pressure with hand over hand direction, VC and demonstration for correct performance. Pt was educated on the importance of easy stretching avoiding pain in order to decrease risk for further formation of cording.    Person(s) Educated  Patient    Methods  Explanation;Demonstration;Verbal cues;Tactile cues;Handout    Comprehension  Verbalized  understanding;Returned demonstration          PT Long Term Goals - 09/28/19 1546      PT LONG TERM GOAL #1   Title  Pt will reach 130 degrees BIl shoulder abduction in order to demonstrate improve functional mobility    Baseline  R shoulder abdct: 110  L shoulder abdct: 112    Time  6    Period  Weeks    Status  New    Target Date  11/16/19      PT LONG TERM GOAL #2   Title  Patient will increase L  shoulder active flexion to >/= 150 degrees to achieve baseline function and be able to reach overhead.    Baseline  L shoulder 143    Time  6    Period  Weeks    Status  On-going    Target Date  11/16/19      PT LONG TERM GOAL #3   Title  Pt will demonstrate understanding of edema management including compression, exercise and self MLD to demonstrate autonomy of care.  Baseline  Pt does not know how to perform self MLD and only wears compression intermittently,    Time  6    Period  Weeks    Status  New    Target Date  11/16/19      PT LONG TERM GOAL #4   Title  Patient will improve her DASH score to </= 10 for impoved overall upper extremity function.    Baseline  45,45    Time  6    Period  Weeks    Status  On-going    Target Date  11/16/19            Plan - 09/28/19 1414    Clinical Impression Statement  Pt presents to physical therapy after a 2 month hold due to she was receiving radiation therapy to the L shoulder. She demonstrates decreased abduction in BIl shoulders and slight improvement in L shoulder flexion since her last session. She demonstrates no increase in edema in her LUE with circumferential measurements. She continues with heaviness in her L breast with non-pitting edema that crepes easily with MLD demonstration good fluid flow out of the breast. Pt was taught self MLD for the L breast this session and provided with a hand out. Discussed performing AA/ROM into flexion/abduction for BIl shoulders due to significant loss of functional ROM in Bil  shoulders. She continues with cording in the L axillary area into the medial brachium; she was educated on the importance of just light stretchingfor this area. Pt will benefit from continued physical therapy services 1x/week for 6 weeks due to pt is concerned about time constraints related to work.    Stability/Clinical Decision Making  Stable/Uncomplicated    Clinical Decision Making  Low    Rehab Potential  Good    PT Frequency  1x / week    PT Duration  6 weeks    PT Treatment/Interventions  ADLs/Self Care Home Management;Therapeutic exercise;Patient/family education;Manual techniques;Manual lymph drainage;Taping;Passive range of motion;Scar mobilization;Compression bandaging;Therapeutic activities    PT Next Visit Plan  Assess self MLD (teach pt spouse MLD if he would like to come and observe), easy AA/ROM stretching for BIl shoulders, MLD of the L breast    PT Home Exercise Plan  self MLD and AA/ROM of the shoulder into flexion/abduct    Consulted and Agree with Plan of Care  Patient       Patient will benefit from skilled therapeutic intervention in order to improve the following deficits and impairments:  Decreased range of motion, Pain, Impaired UE functional use, Decreased knowledge of precautions, Increased edema, Increased fascial restricitons  Visit Diagnosis: Malignant neoplasm of upper-outer quadrant of left breast in female, estrogen receptor positive (HCC)  Abnormal posture  Stiffness of left shoulder, not elsewhere classified  Localized edema  Aftercare following surgery for neoplasm  Stiffness of right shoulder, not elsewhere classified     Problem List Patient Active Problem List   Diagnosis Date Noted  . Family history of breast cancer   . Family history of throat cancer   . Family history of lung cancer   . Family history of colon cancer   . Malignant neoplasm of upper-outer quadrant of left breast in female, estrogen receptor positive ([facility]) 05/20/2019  .  Arthritis 11/23/2017  . Right-sided low back pain without sciatica 12/31/2015  . Left knee pain 11/28/2014  . HTN (hypertension) 06/22/2013  . Genetic testing 06/22/2013    Ander Purpura, PT 09/28/2019, 3:56 PM  [facility]  [place], [place], [address] Phone: [phone]   Fax:  [phone]  Name: Erin Gallagher MRN: [id number] Date of Birth: [birth year]

## 2019-09-28 NOTE — Patient Instructions (Signed)
Self manual lymph drainage: Perform this sequence once a day.  Only give enough pressure no your skin to make the skin move.  Diaphragmatic - Supine   Inhale through nose making navel move out toward hands. Exhale through puckered lips, hands follow navel in. Repeat _5__ times. Rest _10__ seconds between repeats.   Copyright  VHI. All rights reserved.  Hug yourself.  Do circles at your neck just above your collarbones.  Repeat this 10 times.  Axilla - One at a Time   Using full weight of flat hand and fingers at center of both armpits, make _10__ in-place circles.   Copyright  VHI. All rights reserved.  LEG: Inguinal Nodes Stimulation   With small finger side of hand against hip crease on involved side, gently perform circles at the crease. Repeat __10_ times.   Copyright  VHI. All rights reserved.  1) Axilla to Inguinal Nodes - Sweep   On involved side, sweep _4__ times from armpit along side of trunk to hip crease.  Now gently stretch skin from the involved side to the uninvolved side across the chest at the shoulder line.  Repeat that 4 times.  Draw an imaginary diagonal line from upper outer breast through the nipple area toward lower inner breast.  Direct fluid upward and inward from this line toward the pathway across your upper chest .  Do this in three rows to treat all of the upper inner breast tissue, and do each row 3-4x.      Direct fluid to treat all of lower outer breast tissue downward and outward toward      pathway that is aimed at the left groin.  Finish by doing the pathways as described above going from your involved armpit to the same side groin and going across your upper chest from the involved shoulder to the uninvolved shoulder.  Repeat the steps above where you do circles in your left groin and right armpit.   Closed Chain: Shoulder Abduction / Adduction - on Wall    One hand on wall, step to side and return. Stepping causes shoulder to abduct  and adduct. Step _10__ times, each side, _2__ times per day.  http://ss.exer.us/267   Copyright  VHI. All rights reserved.  SHOULDER: Flexion At Wall    Slide both arms up wall while leaning gently into wall. Maintain upright posture and tuck in stomach. Hold _5__ seconds. _10__ reps per set, _2__ sets per day, ___ days per week  Copyright  VHI. All rights reserved.   Copyright  VHI. All rights reserved.

## 2019-09-29 MED FILL — LOSARTAN POTASSIUM 50 MG TA: 50 | 90 days supply | Qty: 90 | Fill #0

## 2019-10-17 ENCOUNTER — Other Ambulatory Visit: Payer: 59

## 2019-10-17 ENCOUNTER — Other Ambulatory Visit: Payer: Self-pay | Admitting: Student

## 2019-10-17 DIAGNOSIS — N644 Mastodynia: Secondary | ICD-10-CM

## 2019-10-17 DIAGNOSIS — N6489 Other specified disorders of breast: Secondary | ICD-10-CM

## 2019-10-17 MED FILL — traMADol HCL 50 MG TABS: 50 | 3 days supply | Qty: 10 | Fill #0

## 2019-10-18 NOTE — Progress Notes (Signed)
  Radiation Oncology         [phone] ________________________________  Name: Erin Gallagher MRN: [id number]  Date: 08/31/2019  DOB: 01/27/1971  End of Treatment Note  Diagnosis:   left-sided breast cancer     Indication for treatment:  Curative       Radiation treatment dates:   07/13/19 - 08/31/19  Site/dose:   The patient initially received a dose of 50.4 Gy in 28 fractions to the breast using whole-breast tangent fields. This was delivered using a 3-D conformal technique. The patient then received a boost to the seroma. This delivered an additional 10 Gy in 5 fractions using a 3-field photon boost technique. The total dose was 60.4 Gy.  Narrative: The patient tolerated radiation treatment relatively well.   The patient had some expected skin irritation as she progressed during treatment. Moist desquamation was not present at the end of treatment.  Plan: The patient has completed radiation treatment. The patient will return to radiation oncology clinic for routine followup in one month. I advised the patient to call or return sooner if they have any questions or concerns related to their recovery or treatment. ________________________________  Jodelle Gross, M.D., Ph.D.

## 2019-10-21 ENCOUNTER — Ambulatory Visit
Admission: RE | Admit: 2019-10-21 | Discharge: 2019-10-21 | Disposition: A | Discharge status: Home / Self Care | Payer: 59 | Source: Ambulatory Visit | Attending: Student | Admitting: Student

## 2019-10-21 ENCOUNTER — Other Ambulatory Visit: Payer: Self-pay

## 2019-10-21 DIAGNOSIS — N644 Mastodynia: Secondary | ICD-10-CM

## 2019-10-21 DIAGNOSIS — L7634 Postprocedural seroma of skin and subcutaneous tissue following other procedure: Secondary | ICD-10-CM | POA: Diagnosis not present

## 2019-10-21 DIAGNOSIS — N6489 Other specified disorders of breast: Secondary | ICD-10-CM

## 2019-10-21 DIAGNOSIS — R928 Other abnormal and inconclusive findings on diagnostic imaging of breast: Secondary | ICD-10-CM | POA: Diagnosis not present

## 2019-10-21 HISTORY — DX: Personal history of irradiation: Z92.3

## 2019-11-08 ENCOUNTER — Other Ambulatory Visit: Payer: Self-pay | Admitting: General Surgery

## 2019-11-08 DIAGNOSIS — N6489 Other specified disorders of breast: Secondary | ICD-10-CM

## 2019-11-08 MED FILL — CYCLOBENZAPRINE HCL 5 MG TA: 5 | 10 days supply | Qty: 30 | Fill #1

## 2019-11-11 ENCOUNTER — Ambulatory Visit
Admission: RE | Admit: 2019-11-11 | Discharge: 2019-11-11 | Disposition: A | Discharge status: Home / Self Care | Payer: 59 | Source: Ambulatory Visit | Attending: General Surgery | Admitting: General Surgery

## 2019-11-11 ENCOUNTER — Other Ambulatory Visit: Payer: Self-pay

## 2019-11-11 DIAGNOSIS — N6489 Other specified disorders of breast: Secondary | ICD-10-CM

## 2019-11-11 DIAGNOSIS — L7682 Other postprocedural complications of skin and subcutaneous tissue: Secondary | ICD-10-CM | POA: Diagnosis not present

## 2019-11-22 ENCOUNTER — Other Ambulatory Visit: Payer: Self-pay | Admitting: General Surgery

## 2019-11-22 DIAGNOSIS — N644 Mastodynia: Secondary | ICD-10-CM

## 2019-11-24 ENCOUNTER — Ambulatory Visit
Admission: RE | Admit: 2019-11-24 | Discharge: 2019-11-24 | Disposition: A | Discharge status: Home / Self Care | Payer: 59 | Source: Ambulatory Visit | Attending: General Surgery | Admitting: General Surgery

## 2019-11-24 ENCOUNTER — Other Ambulatory Visit: Payer: Self-pay

## 2019-11-24 DIAGNOSIS — L7634 Postprocedural seroma of skin and subcutaneous tissue following other procedure: Secondary | ICD-10-CM | POA: Diagnosis not present

## 2019-11-24 DIAGNOSIS — N6489 Other specified disorders of breast: Secondary | ICD-10-CM | POA: Diagnosis not present

## 2019-11-24 DIAGNOSIS — N644 Mastodynia: Secondary | ICD-10-CM

## 2019-11-25 MED FILL — DOXYCYCLINE HYCLATE 100 MG: 100 | 10 days supply | Qty: 20 | Fill #0

## 2019-11-28 ENCOUNTER — Telehealth: Payer: Self-pay | Admitting: Adult Health

## 2019-11-28 ENCOUNTER — Inpatient Hospital Stay: Payer: 59 | Attending: Hematology and Oncology | Admitting: Adult Health

## 2019-11-28 ENCOUNTER — Encounter: Payer: Self-pay | Admitting: Adult Health

## 2019-11-28 ENCOUNTER — Other Ambulatory Visit: Payer: Self-pay

## 2019-11-28 VITALS — BP 135/74 | HR 81 | Temp 97.9°F | Resp 20 | Ht 66.0 in | Wt 258.3 lb

## 2019-11-28 DIAGNOSIS — N6489 Other specified disorders of breast: Secondary | ICD-10-CM | POA: Insufficient documentation

## 2019-11-28 DIAGNOSIS — Z79899 Other long term (current) drug therapy: Secondary | ICD-10-CM | POA: Diagnosis not present

## 2019-11-28 DIAGNOSIS — Z801 Family history of malignant neoplasm of trachea, bronchus and lung: Secondary | ICD-10-CM | POA: Diagnosis not present

## 2019-11-28 DIAGNOSIS — C50412 Malignant neoplasm of upper-outer quadrant of left female breast: Secondary | ICD-10-CM | POA: Insufficient documentation

## 2019-11-28 DIAGNOSIS — I10 Essential (primary) hypertension: Secondary | ICD-10-CM | POA: Insufficient documentation

## 2019-11-28 DIAGNOSIS — Z79810 Long term (current) use of selective estrogen receptor modulators (SERMs): Secondary | ICD-10-CM | POA: Diagnosis not present

## 2019-11-28 DIAGNOSIS — R5383 Other fatigue: Secondary | ICD-10-CM | POA: Diagnosis not present

## 2019-11-28 DIAGNOSIS — Z923 Personal history of irradiation: Secondary | ICD-10-CM | POA: Insufficient documentation

## 2019-11-28 DIAGNOSIS — M1990 Unspecified osteoarthritis, unspecified site: Secondary | ICD-10-CM | POA: Diagnosis not present

## 2019-11-28 DIAGNOSIS — Z170 Estrogen receptor positive status [ER+]: Secondary | ICD-10-CM | POA: Diagnosis not present

## 2019-11-28 NOTE — Telephone Encounter (Signed)
Scheduled appt per 3/29 los. Pt confirmed new appt date and time.

## 2019-11-28 NOTE — Progress Notes (Signed)
SURVIVORSHIP VISIT:  BRIEF ONCOLOGIC HISTORY:  Oncology History  Malignant neoplasm of upper-outer quadrant of left breast in female, estrogen receptor positive ([place])  05/20/2019 Initial Diagnosis   Routine screening mammogram detected a 1.4cm left breast mass at the 2:30 position, no axillary adenopathy. 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    06/05/2019 Genetic Testing   Negative genetic testing. No pathogenic variants identified on the [facility] Common Hereditary Cancers Panel + STAT Breast Cancer panel.The STAT Breast cancer panel offered by [facility] includes sequencing and rearrangement analysis for the following 9 genes:  ATM, BRCA1, BRCA2, CDH1, CHEK2, PALB2, PTEN, STK11 and TP53. The Common Hereditary Cancers Panel offered by [facility] includes sequencing and/or deletion duplication testing of the following 47 genes: APC, ATM, AXIN2, BARD1, BMPR1A, BRCA1, BRCA2, BRIP1, CDH1, CDKN2A (p14ARF), CDKN2A (p16INK4a), CKD4, CHEK2, CTNNA1, DICER1, EPCAM (Deletion/duplication testing only), GREM1 (promoter region deletion/duplication testing only), KIT, MEN1, MLH1, MSH2, MSH3, MSH6, MUTYH, NBN, NF1, NHTL1, PALB2, PDGFRA, PMS2, POLD1, POLE, PTEN, RAD50, RAD51C, RAD51D, SDHB, SDHC, SDHD, SMAD4, SMARCA4. STK11, TP53, TSC1, TSC2, and VHL.  The following genes were evaluated for sequence changes only: SDHA and HOXB13 c.251G>A variant only. The report date is 06/05/2019.    06/14/2019 Surgery   Left lumpectomy Barry Dienes) [phone]): IDC, grade 2, with high grade DCIS, 1.7cm, clear margins, 2 left axillary lymph nodes negative for carcinoma.    06/24/2019 Oncotype testing   Recurrence score: 13; low risk, distant recurrence at 9 years: 4%   07/13/2019 - 08/31/2019 Radiation Therapy   The patient initially received a dose of 50.4 Gy in 28 fractions to the breast using whole-breast tangent fields. This was delivered using a 3-D conformal technique. The patient then  received a boost to the seroma. This delivered an additional 10 Gy in 5 fractions using a 3-field photon boost technique. The total dose was 60.4 Gy.   08/2019 - 08/2024 Anti-estrogen oral therapy   Tamoxifen     INTERVAL HISTORY:  Erin Gallagher to review her survivorship care plan detailing her treatment course for breast cancer, as well as monitoring long-term side effects of that treatment, education regarding health maintenance, screening, and overall wellness and health promotion.     Overall, Erin Gallagher reports feeling quite well.  She completed survivorship survey.  She has shortness of breath after physical exercise, and attributes this to deconditioning and her weight.  She notes fatigue about 4 days of the week about 6/10.  She has pain in her left breast 5/10--has had seromas drained on several occasions from her left breast and is currently taking Doxycycline BID.  She is wearing a compression bra and participated in PT which she didn't note an improvement, so she stopped going.  She has some sleep issues.  She says she tosses until she falls asleep.  She does not feel rested.  She does not have a gynecologist, Dr. Pamella Pert completes her pap smears.    REVIEW OF SYSTEMS:  Review of Systems  Constitutional: Positive for fatigue. Negative for appetite change, chills, fever and unexpected weight change.  HENT:   Negative for hearing loss, lump/mass and trouble swallowing.   Eyes: Negative for eye problems and icterus.  Respiratory: Negative for chest tightness, cough and shortness of breath.   Cardiovascular: Negative for chest pain, leg swelling and palpitations.  Gastrointestinal: Negative for abdominal distention, abdominal pain, constipation, diarrhea, nausea and vomiting.  Endocrine: Negative for hot flashes.  Genitourinary: Negative for difficulty  urinating.   Musculoskeletal: Negative for arthralgias.  Skin: Negative for itching and rash.  Neurological: Negative for dizziness,  extremity weakness, headaches and numbness.  Hematological: Negative for adenopathy. Does not bruise/bleed easily.  Psychiatric/Behavioral: Positive for sleep disturbance. Negative for depression. The patient is not nervous/anxious.   Breast: Denies any new nodularity, masses, tenderness, nipple changes, or nipple discharge.      ONCOLOGY TREATMENT TEAM:  1. Surgeon:  Dr. Barry [facility]ienes at [facility] 2. Medical Oncologist: Dr. Lindi Adie  3. Radiation Oncologist: Dr. Lisbeth Renshaw    PAST MEDICAL/SURGICAL HISTORY:  Past Medical History:  Diagnosis Date  . Arthritis   . Family history of breast cancer   . Family history of colon cancer   . Family history of lung cancer   . Family history of throat cancer   . Gall bladder disease    gall bladder removal  . Hypertension   . Personal history of radiation therapy    Past Surgical History:  Procedure Laterality Date  . BREAST BIOPSY Left 05/16/2019  . BREAST LUMPECTOMY Left 06/14/2019  . BREAST LUMPECTOMY WITH RADIOACTIVE SEED AND SENTINEL LYMPH NODE BIOPSY Left 06/14/2019   Procedure: LEFT BREAST LUMPECTOMY WITH RADIOACTIVE SEED AND SENTINEL LYMPH NODE BIOPSY;  Surgeon: [name], MD;  Location: [facility];  Service: General;  Laterality: Left;  . CESAREAN SECTION    . CHOLECYSTECTOMY    . TUBAL LIGATION       ALLERGIES:  Allergies  Allergen Reactions  . Other Rash    Steri Strips cause itching and a rash     CURRENT MEDICATIONS:  Outpatient Encounter Medications as of 11/28/2019  Medication Sig  . amLODipine (NORVASC) 10 MG tablet Take 1 tablet (10 mg total) by mouth daily.  . cyclobenzaprine (FLEXERIL) 5 MG tablet 1 pill by mouth up to every 8 hours as needed.  Marland Kitchen losartan (COZAAR) 50 MG tablet Take 1 tablet (50 mg total) by mouth daily.  . tamoxifen (NOLVADEX) 20 MG tablet Take 1 tablet (20 mg total) by mouth daily.   No facility-administered encounter medications on file as of 11/28/2019.      ONCOLOGIC FAMILY HISTORY:  Family History  Problem Relation Age of Onset  . Hypertension Mother   . Heart disease Father   . Hypertension Father   . Colon cancer Father        dx 67s  . Stroke Maternal Grandmother   . Hypertension Maternal Grandmother   . Breast cancer Maternal Aunt        dx 76s  . Throat cancer Maternal Uncle   . Cancer Maternal Uncle   . Lung cancer Paternal Uncle   . Stomach cancer Neg Hx   . Esophageal cancer Neg Hx   . Rectal cancer Neg Hx   . Liver cancer Neg Hx      GENETIC COUNSELING/TESTING: See above  SOCIAL HISTORY:  Social History   Socioeconomic History  . Marital status: Married    Spouse name: Not on file  . Number of children: 1  . Years of education: Not on file  . Highest education level: Not on file  Occupational History  . Occupation: Librarian, academic  Tobacco Use  . Smoking status: Never Smoker  . Smokeless tobacco: Never Used  Substance and Sexual Activity  . Alcohol use: Yes    Alcohol/week: 2.0 standard drinks    Types: 2 Cans of beer per week    Comment: occasionally  . Drug use: No  . Sexual  activity: Yes    Birth control/protection: Other-see comments, Surgical    Comment: Tubal ligation2  Other Topics Concern  . Not on file  Social History Narrative  . Not on file   Social Determinants of Health   Financial Resource Strain:   . Difficulty of Paying Living Expenses:   Food Insecurity:   . Worried About Charity fundraiser in the Last Year:   . Arboriculturist in the Last Year:   Transportation Needs:   . Film/video editor (Medical):   Marland Kitchen Lack of Transportation (Non-Medical):   Physical Activity:   . Days of Exercise per Week:   . Minutes of Exercise per Session:   Stress:   . Feeling of Stress :   Social Connections:   . Frequency of Communication with Friends and Family:   . Frequency of Social Gatherings with Friends and Family:   . Attends Religious Services:   . Active Member of Clubs or  Organizations:   . Attends Archivist Meetings:   Marland Kitchen Marital Status:   Intimate Partner Violence:   . Fear of Current or Ex-Partner:   . Emotionally Abused:   Marland Kitchen Physically Abused:   . Sexually Abused:      OBSERVATIONS/OBJECTIVE:  BP 135/74 (BP Location: Right Arm, Patient Position: Sitting)   Pulse 81   Temp 97.9 F (36.6 C) (Temporal)   Resp 20   Ht '5\' 6"'$  (1.676 m)   Wt 258 lb 4.8 oz (117.2 kg)   SpO2 99%   BMI 41.69 kg/m  GENERAL: Patient is a well appearing female in no acute distress HEENT:  Sclerae anicteric.  Oropharynx clear and moist. No ulcerations or evidence of oropharyngeal candidiasis. Neck is supple.  NODES:  No cervical, supraclavicular, or axillary lymphadenopathy palpated.  BREAST EXAM:  Right breast benign, left breast s/p lumpectomy and radiation, slight swelling, no sign of local recurrence LUNGS:  Clear to auscultation bilaterally.  No wheezes or rhonchi. HEART:  Regular rate and rhythm. No murmur appreciated. ABDOMEN:  Soft, nontender.  Positive, normoactive bowel sounds. No organomegaly palpated. MSK:  No focal spinal tenderness to palpation. Full range of motion bilaterally in the upper extremities. EXTREMITIES:  No peripheral edema.   SKIN:  Clear with no obvious rashes or skin changes. No nail dyscrasia. NEURO:  Nonfocal. Well oriented.  Appropriate affect.    LABORATORY DATA:  None for this visit.  DIAGNOSTIC IMAGING:  None for this visit.      ASSESSMENT AND PLAN:  Ms.. Gallagher is a pleasant 49 y.o. female with Stage IA left breast invasive ductal carcinoma, ER+/PR+/HER2-, diagnosed in 05/2019, treated with lumpectomy, adjuvant radiation therapy, and anti-estrogen therapy with Tamoxifen beginning in 08/2019.  She presents to the Survivorship Clinic for our initial meeting and routine follow-up post-completion of treatment for breast cancer.    1. Stage IA left breast cancer:  Erin Gallagher is continuing to recover from definitive  treatment for breast cancer. She will follow-up with her medical oncologist, Dr. Lindi Adie in 6 months with history and physical exam per surveillance protocol.  She will continue her anti-estrogen therapy with Tamoxifen. Thus far, she is tolerating the Tamoxifen well, with minimal side effects. She was instructed to make Dr. Lindi Adie or myself aware if she begins to experience any worsening side effects of the medication and I could see her back in clinic to help manage those side effects, as needed. Her mammogram is due 05/2020; orders placed today.  Her breast  density is category B. Today, a comprehensive survivorship care plan and treatment summary was reviewed with the patient today detailing her breast cancer diagnosis, treatment course, potential late/long-term effects of treatment, appropriate follow-up care with recommendations for the future, and patient education resources.  A copy of this summary, along with a letter will be sent to the patient's primary care provider via mail/fax/In Basket message after today's visit.    2. Bone health: She was given education on specific activities to promote bone health.  3. Cancer screening:  Due to Erin Gallagher's history and her age, she should receive screening for skin cancers, colon cancer, and gynecologic cancers.  The information and recommendations are listed on the patient's comprehensive care plan/treatment summary and were reviewed in detail with the patient.    4. Health maintenance and wellness promotion: Erin Gallagher was encouraged to consume 5-7 servings of fruits and vegetables per day. We reviewed the "Nutrition Rainbow" handout, as well as the handout "Take Control of Your Health and Reduce Your Cancer Risk" from the [place].  She was also encouraged to engage in moderate to vigorous exercise for 30 minutes per day most days of the week. We discussed the [employer] fitness program, which is designed for cancer survivors to help them  become more physically fit after cancer treatments.  She was instructed to limit her alcohol consumption and continue to abstain from tobacco use.     5. Support services/counseling: It is not uncommon for this period of the patient's cancer care trajectory to be one of many emotions and stressors.  We discussed how this can be increasingly difficult during the times of quarantine and social distancing due to the COVID-19 pandemic.   She was given information regarding our available services and encouraged to contact me with any questions or for help enrolling in any of our support group/programs.    Follow up instructions:    -Return to cancer center in 6 months for f/u with Dr. Lindi Adie  -Mammogram due in 05/2020 -See Dr. Barry Dienes 11/2020 (and as needed for Seroma management) -She is welcome to return back to the Survivorship Clinic at any time; no additional follow-up needed at this time.  -Consider referral back to survivorship as a long-term survivor for continued surveillance  The patient was provided an opportunity to ask questions and all were answered. The patient agreed with the plan and demonstrated an understanding of the instructions.   Total encounter time: 30 minutes*  Wilber Bihari, NP 11/28/19 12:07 PM Medical Oncology and Hematology [facility] [place], [place] [address] Tel. [phone]    Fax. [phone]  *Total Encounter Time as defined by the Centers for Medicare and Medicaid Services includes, in addition to the face-to-face time of a patient visit (documented in the note above) non-face-to-face time: obtaining and reviewing outside history, ordering and reviewing medications, tests or procedures, care coordination (communications with other health care professionals or caregivers) and documentation in the medical record.

## 2019-12-19 ENCOUNTER — Other Ambulatory Visit: Payer: Self-pay | Admitting: General Surgery

## 2019-12-19 DIAGNOSIS — N644 Mastodynia: Secondary | ICD-10-CM

## 2019-12-20 ENCOUNTER — Ambulatory Visit
Admission: RE | Admit: 2019-12-20 | Discharge: 2019-12-20 | Disposition: A | Discharge status: Home / Self Care | Payer: 59 | Source: Ambulatory Visit | Attending: General Surgery | Admitting: General Surgery

## 2019-12-20 ENCOUNTER — Other Ambulatory Visit: Payer: Self-pay

## 2019-12-20 DIAGNOSIS — N644 Mastodynia: Secondary | ICD-10-CM

## 2019-12-20 DIAGNOSIS — N6489 Other specified disorders of breast: Secondary | ICD-10-CM | POA: Diagnosis not present

## 2019-12-20 DIAGNOSIS — L7634 Postprocedural seroma of skin and subcutaneous tissue following other procedure: Secondary | ICD-10-CM | POA: Diagnosis not present

## 2020-01-16 MED FILL — AMLODIPINE BESYLATE 10 MG T: 10 | 90 days supply | Qty: 90 | Fill #1

## 2020-01-17 MED FILL — LOSARTAN POTASSIUM 50 MG TA: 50 | 90 days supply | Qty: 90 | Fill #1

## 2020-02-13 ENCOUNTER — Other Ambulatory Visit: Payer: Self-pay | Admitting: General Surgery

## 2020-02-13 ENCOUNTER — Other Ambulatory Visit: Payer: Self-pay | Admitting: Adult Health

## 2020-02-13 DIAGNOSIS — N644 Mastodynia: Secondary | ICD-10-CM

## 2020-02-15 ENCOUNTER — Other Ambulatory Visit: Payer: Self-pay | Admitting: Family Medicine

## 2020-02-15 MED ORDER — NAPROXEN 500 MG PO TABS: 500.0000 mg | ORAL_TABLET | Freq: Two times a day (BID) | ORAL | 1 refills | Status: DC

## 2020-02-15 NOTE — Telephone Encounter (Signed)
Request for new Rx- not on current medication list. Naprosyn 500 mg

## 2020-02-15 NOTE — Telephone Encounter (Signed)
Medication Refill - Medication:  naproxen (NAPROSYN) 500 MG tablet  Has the patient contacted their pharmacy? No. (Agent: If no, request that the patient contact the pharmacy for the refill.) (Agent: If yes, when and what did the pharmacy advise?)  Preferred Pharmacy (with phone number or street name):  [facility], [place] Phone:  [phone]  Fax:  [phone]       Agent: Please be advised that RX refills may take up to 3 business days. We ask that you follow-up with your pharmacy.

## 2020-02-15 NOTE — Telephone Encounter (Signed)
Okay to fill the naproxen 500 mg RX?  Pt stated that she stopped taking it for a while, but her hip started right hip pain started to flair up so she was needing it again. Since it is not longer on the medication list I have informed the pt that I would double check with provider about filling medication.   I have informed her she may be able to take the otc version in the mean time if she really needs.   She stated understanding. Please advise on refill that has been pended.   Pt was last seen on 09/08/19 and has a f/u appointment on 03/01/20  Thanks.

## 2020-02-16 MED FILL — NAPROXEN 500 MG TABS: 500 | 30 days supply | Qty: 60 | Fill #0

## 2020-02-17 ENCOUNTER — Ambulatory Visit
Admission: RE | Admit: 2020-02-17 | Discharge: 2020-02-17 | Disposition: A | Discharge status: Home / Self Care | Payer: 59 | Source: Ambulatory Visit | Attending: General Surgery | Admitting: General Surgery

## 2020-02-17 ENCOUNTER — Other Ambulatory Visit: Payer: Self-pay

## 2020-02-17 DIAGNOSIS — R928 Other abnormal and inconclusive findings on diagnostic imaging of breast: Secondary | ICD-10-CM | POA: Diagnosis not present

## 2020-02-17 DIAGNOSIS — Z853 Personal history of malignant neoplasm of breast: Secondary | ICD-10-CM | POA: Diagnosis not present

## 2020-02-17 DIAGNOSIS — N644 Mastodynia: Secondary | ICD-10-CM

## 2020-02-17 DIAGNOSIS — N6321 Unspecified lump in the left breast, upper outer quadrant: Secondary | ICD-10-CM | POA: Diagnosis not present

## 2020-02-17 DIAGNOSIS — N6489 Other specified disorders of breast: Secondary | ICD-10-CM

## 2020-02-17 DIAGNOSIS — L7634 Postprocedural seroma of skin and subcutaneous tissue following other procedure: Secondary | ICD-10-CM | POA: Diagnosis not present

## 2020-02-17 DIAGNOSIS — N6323 Unspecified lump in the left breast, lower outer quadrant: Secondary | ICD-10-CM | POA: Diagnosis not present

## 2020-02-22 LAB — AEROBIC/ANAEROBIC CULTURE W GRAM STAIN (SURGICAL/DEEP WOUND): Culture: NO GROWTH

## 2020-03-01 ENCOUNTER — Other Ambulatory Visit: Payer: Self-pay | Admitting: Family Medicine

## 2020-03-01 ENCOUNTER — Ambulatory Visit: Payer: 59 | Admitting: Family Medicine

## 2020-03-01 ENCOUNTER — Ambulatory Visit (INDEPENDENT_AMBULATORY_CARE_PROVIDER_SITE_OTHER): Payer: 59

## 2020-03-01 ENCOUNTER — Encounter: Payer: Self-pay | Admitting: Family Medicine

## 2020-03-01 ENCOUNTER — Other Ambulatory Visit: Payer: Self-pay

## 2020-03-01 VITALS — BP 140/90 | HR 74 | Temp 98.0°F | Ht 66.0 in | Wt 255.6 lb

## 2020-03-01 DIAGNOSIS — M25561 Pain in right knee: Secondary | ICD-10-CM | POA: Diagnosis not present

## 2020-03-01 DIAGNOSIS — M25562 Pain in left knee: Secondary | ICD-10-CM | POA: Diagnosis not present

## 2020-03-01 DIAGNOSIS — I10 Essential (primary) hypertension: Secondary | ICD-10-CM

## 2020-03-01 DIAGNOSIS — I83812 Varicose veins of left lower extremities with pain: Secondary | ICD-10-CM | POA: Diagnosis not present

## 2020-03-01 DIAGNOSIS — M7122 Synovial cyst of popliteal space [Baker], left knee: Secondary | ICD-10-CM

## 2020-03-01 DIAGNOSIS — M25569 Pain in unspecified knee: Secondary | ICD-10-CM

## 2020-03-01 LAB — CMP14+EGFR
ALT: 14 IU/L (ref 0–32)
AST: 13 IU/L (ref 0–40)
Albumin/Globulin Ratio: 1.5 (ref 1.2–2.2)
Albumin: 4.1 g/dL (ref 3.8–4.8)
Alkaline Phosphatase: 64 IU/L (ref 48–121)
BUN/Creatinine Ratio: 12 (ref 9–23)
BUN: 9 mg/dL (ref 6–24)
Bilirubin Total: 0.3 mg/dL (ref 0.0–1.2)
CO2: 21 mmol/L (ref 20–29)
Calcium: 8.7 mg/dL (ref 8.7–10.2)
Chloride: 105 mmol/L (ref 96–106)
Creatinine, Ser: 0.74 mg/dL (ref 0.57–1.00)
GFR calc Af Amer: 110 mL/min/{1.73_m2} (ref >59)
GFR calc non Af Amer: 95 mL/min/{1.73_m2} (ref >59)
Globulin, Total: 2.7 g/dL (ref 1.5–4.5)
Glucose: 87 mg/dL (ref 65–99)
Potassium: 4.3 mmol/L (ref 3.5–5.2)
Sodium: 138 mmol/L (ref 134–144)
Total Protein: 6.8 g/dL (ref 6.0–8.5)

## 2020-03-01 LAB — LIPID PANEL
Chol/HDL Ratio: 3.2 ratio (ref 0.0–4.4)
Cholesterol, Total: 170 mg/dL (ref 100–199)
HDL: 53 mg/dL (ref >39)
LDL Chol Calc (NIH): 98 mg/dL (ref 0–99)
Triglycerides: 108 mg/dL (ref 0–149)
VLDL Cholesterol Cal: 19 mg/dL (ref 5–40)

## 2020-03-01 MED ORDER — LOSARTAN POTASSIUM-HCTZ 50-12.5 MG PO TABS: 1.0000 | ORAL_TABLET | Freq: Every day | ORAL | 1 refills | Status: DC

## 2020-03-01 MED ORDER — AMLODIPINE BESYLATE 10 MG PO TABS: 10.0000 mg | ORAL_TABLET | Freq: Every day | ORAL | 1 refills | Status: DC

## 2020-03-01 MED FILL — LOSARTAN-HCTZ 50-12.5 MG TA: 50-12.5 | 90 days supply | Qty: 90 | Fill #0

## 2020-03-01 NOTE — Progress Notes (Signed)
7/1/20219:57 AM  Erin Gallagher 12/31/1970, 49 y.o., female [id number]  Chief Complaint  Patient presents with  . Hypertension  . Pain    left knee, very pain for the pst 2 weeks. Taking naproxen and flexeril for the pain    HPI:   Patient is a 49 y.o. female with past medical history significant for HTN and LEFT breast cancer who presents today for routine followup  Last OV Jan 2021 - no changes  2 weeks of Left knee pain, intense pain Started behind knee and warps around front knee, radiates down to her foot, numbness She remember it feeling hot and fullness, denies any redness She denies any trauma Has been using ice hot and biofreeze She has been walking more trying to lose weight  She denies any fever, chills She denies any chest pain, no worsening SOB or palpitations She denies any cough  Lab Results  Component Value Date   CREATININE 0.69 09/08/2019   BUN 9 09/08/2019   NA 138 09/08/2019   K 3.8 09/08/2019   CL 105 09/08/2019   CO2 20 09/08/2019    Depression screen PHQ 2/9 09/08/2019 03/08/2019 06/17/2018  Decreased Interest 0 0 0  Down, Depressed, Hopeless 0 0 0  PHQ - 2 Score 0 0 0    Fall Risk  03/01/2020 09/08/2019 03/08/2019 06/17/2018 04/30/2018  Falls in the past year? 0 0 0 No No  Number falls in past yr: 0 0 0 - -  Injury with Fall? 0 0 0 - -     Allergies  Allergen Reactions  . Other Rash    Steri Strips cause itching and a rash    Prior to Admission medications   Medication Sig Start Date End Date Taking? Authorizing Provider  amLODipine (NORVASC) 10 MG tablet Take 1 tablet (10 mg total) by mouth daily. 09/08/19  Yes Rutherford Guys, MD  cyclobenzaprine (FLEXERIL) 5 MG tablet 1 pill by mouth up to every 8 hours as needed. 09/08/19  Yes Rutherford Guys, MD  losartan (COZAAR) 50 MG tablet Take 1 tablet (50 mg total) by mouth daily. 09/08/19  Yes Rutherford Guys, MD  naproxen (NAPROSYN) 500 MG tablet Take 1 tablet (500 mg total) by mouth 2 (two)  times daily with a meal. 02/15/20  Yes Rutherford Guys, MD  tamoxifen (NOLVADEX) 20 MG tablet Take 1 tablet (20 mg total) by mouth daily. 08/29/19  Yes Nicholas Lose, MD    Past Medical History:  Diagnosis Date  . Arthritis   . Family history of breast cancer   . Family history of colon cancer   . Family history of lung cancer   . Family history of throat cancer   . Gall bladder disease    gall bladder removal  . Hypertension   . Personal history of radiation therapy     Past Surgical History:  Procedure Laterality Date  . BREAST BIOPSY Left 05/16/2019  . BREAST LUMPECTOMY Left 06/14/2019  . BREAST LUMPECTOMY WITH RADIOACTIVE SEED AND SENTINEL LYMPH NODE BIOPSY Left 06/14/2019   Procedure: LEFT BREAST LUMPECTOMY WITH RADIOACTIVE SEED AND SENTINEL LYMPH NODE BIOPSY;  Surgeon: [name], MD;  Location: [facility];  Service: General;  Laterality: Left;  . CESAREAN SECTION    . CHOLECYSTECTOMY    . TUBAL LIGATION      Social History   Tobacco Use  . Smoking status: Never Smoker  . Smokeless tobacco: Never Used  Substance Use Topics  .  Alcohol use: Yes    Alcohol/week: 2.0 standard drinks    Types: 2 Cans of beer per week    Comment: occasionally    Family History  Problem Relation Age of Onset  . Hypertension Mother   . Heart disease Father   . Hypertension Father   . Colon cancer Father        dx 35s  . Stroke Maternal Grandmother   . Hypertension Maternal Grandmother   . Breast cancer Maternal Aunt        dx 29s  . Throat cancer Maternal Uncle   . Cancer Maternal Uncle   . Lung cancer Paternal Uncle   . Stomach cancer Neg Hx   . Esophageal cancer Neg Hx   . Rectal cancer Neg Hx   . Liver cancer Neg Hx     ROS Per hpi  OBJECTIVE:  Today's Vitals   03/01/20 0950  BP: 140/90  Pulse: 74  Temp: 98 F (36.7 C)  SpO2: 100%  Weight: 255 lb 9.6 oz (115.9 kg)  Height: _0  (1.676 m)   Body mass index is 41.25 kg/m.   Wt Readings  from Last 3 Encounters:  03/01/20 255 lb 9.6 oz (115.9 kg)  11/28/19 258 lb 4.8 oz (117.2 kg)  09/08/19 265 lb (120.2 kg)     Physical Exam Vitals and nursing note reviewed.  Constitutional:      Appearance: She is well-developed.  HENT:     Head: Normocephalic and atraumatic.     Mouth/Throat:     Pharynx: No oropharyngeal exudate.  Eyes:     General: No scleral icterus.    Conjunctiva/sclera: Conjunctivae normal.     Pupils: Pupils are equal, round, and reactive to light.  Cardiovascular:     Rate and Rhythm: Normal rate and regular rhythm.     Heart sounds: Normal heart sounds. No murmur heard.  No friction rub. No gallop.   Pulmonary:     Effort: Pulmonary effort is normal.     Breath sounds: Normal breath sounds. No wheezing or rales.  Musculoskeletal:     Cervical back: Neck supple.     Left knee: Swelling present. No effusion or erythema. Normal range of motion. Tenderness (posterior fossa) present. No LCL laxity or MCL laxity.    Instability Tests: Anterior drawer test negative. Posterior drawer test negative. Medial McMurray test negative and lateral McMurray test negative.     Comments: Patient with prominent varicose veins in left knee posterior fossa  Skin:    General: Skin is warm and dry.  Neurological:     Mental Status: She is alert and oriented to person, place, and time.     No results found for this or any previous visit (from the past 24 hour(s)).  DG Knee Complete 4 Views Left  Result Date: 03/01/2020 CLINICAL DATA:  Right knee pain.  No known injury. EXAM: LEFT KNEE - COMPLETE 4+ VIEW COMPARISON:  None. FINDINGS: There is no acute bony or joint abnormality. Degenerative change about the knee is seen and worst in the medial compartment where there is a greater degree of osteophytosis and joint space narrowing. No joint effusion or chondrocalcinosis. No worrisome bony lesion. Soft tissues unremarkable. IMPRESSION: Age advanced osteoarthritis about the knee  appears worst in the No acute finding. Electronically Signed   By: Inge Rise M.D.   On: 03/01/2020 10:34     ASSESSMENT and PLAN  1. Essential hypertension Controlled. Continue current regime.  - Lipid panel -  [id number] - Basic Metabolic Panel; Future  2. Posterior left knee pain History suggestive of bakers cyst though none seen on exam. [place] to r/o. Discussed RICE therapy.  - DG Knee Complete 4 Views Left; Future - [place] LT LOWER EXTREM LTD SOFT TISSUE NON VASCULAR; Future  3. Varicose veins of left lower extremity with pain Consider referral to specialist as might be contributing to knee symptoms  Other orders - losartan-hydrochlorothiazide (HYZAAR) 50-12.5 MG tablet; Take 1 tablet by mouth daily. - amLODipine (NORVASC) 10 MG tablet; Take 1 tablet (10 mg total) by mouth daily.  Return in about 6 months (around 09/01/2020).    Rutherford Guys, MD Primary [facility] at [facility] [place], [place] [address] Ph.  [phone] Fax [phone]

## 2020-03-01 NOTE — Patient Instructions (Signed)
° ° ° °  If you have lab work done today you will be contacted with your lab results within the next 2 weeks.  If you have not heard from us then please contact us. The fastest way to get your results is to register for My Chart. ° ° °IF you received an x-ray today, you will receive an invoice from [facility]. Please contact  Radiology at [phone] with questions or concerns regarding your invoice.  ° °IF you received labwork today, you will receive an invoice from [facility]. Please contact [facility] at [phone] with questions or concerns regarding your invoice.  ° °Our billing staff will not be able to assist you with questions regarding bills from these companies. ° °You will be contacted with the lab results as soon as they are available. The fastest way to get your results is to activate your My Chart account. Instructions are located on the last page of this paperwork. If you have not heard from us regarding the results in 2 weeks, please contact this office. °  ° ° ° °

## 2020-03-14 ENCOUNTER — Ambulatory Visit
Admission: RE | Admit: 2020-03-14 | Discharge: 2020-03-14 | Disposition: A | Discharge status: Home / Self Care | Payer: 59 | Source: Ambulatory Visit | Attending: Family Medicine | Admitting: Family Medicine

## 2020-03-14 DIAGNOSIS — M7122 Synovial cyst of popliteal space [Baker], left knee: Secondary | ICD-10-CM | POA: Diagnosis not present

## 2020-03-14 DIAGNOSIS — M25562 Pain in left knee: Secondary | ICD-10-CM

## 2020-03-15 NOTE — Addendum Note (Signed)
Addended by: Rutherford Guys on: 03/15/2020 05:48 PM   Modules accepted: Orders

## 2020-03-22 ENCOUNTER — Ambulatory Visit: Payer: 59 | Admitting: Orthopedic Surgery

## 2020-03-22 ENCOUNTER — Encounter: Payer: Self-pay | Admitting: Physician Assistant

## 2020-03-22 ENCOUNTER — Other Ambulatory Visit: Payer: Self-pay

## 2020-03-22 VITALS — Ht 66.0 in | Wt 255.0 lb

## 2020-03-22 DIAGNOSIS — G8929 Other chronic pain: Secondary | ICD-10-CM

## 2020-03-22 DIAGNOSIS — M25562 Pain in left knee: Secondary | ICD-10-CM | POA: Diagnosis not present

## 2020-03-22 MED ORDER — METHYLPREDNISOLONE ACETATE 40 MG/ML IJ SUSP
40.0000 mg | INTRAMUSCULAR | Status: AC | PRN
  Administered 2020-03-22: 40 mg via INTRA_ARTICULAR

## 2020-03-22 MED ORDER — LIDOCAINE HCL (PF) 1 % IJ SOLN
5.0000 mL | INTRAMUSCULAR | Status: AC | PRN
  Administered 2020-03-22: 5 mL

## 2020-03-22 NOTE — Progress Notes (Signed)
Office Visit Note   Patient: Erin Gallagher           Date of Birth: February 24, 1971           MRN: [id number] Visit Date: 03/22/2020              Requested by: Rutherford Guys, MD [address] [place],  [place] [address] PCP: Rutherford Guys, MD  Chief Complaint  Patient presents with  . Left Knee - Pain      HPI: Patient is a 49 year old woman who presents complaining of chronic pain left knee she did have an ultrasound on July 15 which showed a Baker's cyst.  She complains of swelling on and off global pain around the knee states her knee feels tight has pain with weightbearing and popping and start up stiffness she has used Naprosyn.  Assessment & Plan: Visit Diagnoses:  1. Chronic pain of left knee     Plan: The knee was injected discussed that her symptoms are from the swelling from the arthritis.  Discussed that we could proceed with 3 injections a year to help with the knee symptoms.  Discussed surgical intervention is an option after failure of conservative therapy.  Follow-Up Instructions: Return if symptoms worsen or fail to improve.   Ortho Exam  Patient is alert, oriented, no adenopathy, well-dressed, normal affect, normal respiratory effort. Examination patient has an antalgic gait she has mild swelling around the left knee the calf is soft nontender no pain with dorsiflexion of the ankle no clinical signs of DVT no cellulitis.  She has generalized tenderness to palpation around the knee including the patellofemoral joint as well as medial and lateral joint lines there is mild crepitation with range of motion.  Collaterals and cruciates are stable.  She has a little bit of tenderness to palpation of the popliteal fossa as well but no significant swelling  Imaging: No results found. No images are attached to the encounter.  Labs: Lab Results  Component Value Date   HGBA1C 6.0 07/10/2014   REPTSTATUS 02/22/2020 FINAL 02/17/2020   GRAMSTAIN  02/17/2020    MODERATE  WBC PRESENT,BOTH PMN AND MONONUCLEAR NO ORGANISMS SEEN    CULT  02/17/2020    No growth aerobically or anaerobically. Performed at [facility], [place] [address] [place], [place] [address]      Lab Results  Component Value Date   ALBUMIN 4.1 03/01/2020   ALBUMIN 3.9 09/08/2019   ALBUMIN 3.9 05/25/2019    No results found for: MG Lab Results  Component Value Date   VD25OH 25.1 (L) 12/03/2017    No results found for: PREALBUMIN CBC EXTENDED Latest Ref Rng & Units 05/25/2019 03/08/2019 11/23/2017  WBC 4.0 - 10.5 K/uL 7.0 6.7 7.8  RBC 3.87 - 5.11 MIL/uL 5.05 5.13 4.81  HGB 12.0 - 15.0 g/dL 13.7 13.8 13.4  HCT 36 - 46 % 41.8 41.1 38.9  PLT 150 - 400 K/uL 344 366 361  NEUTROABS 1.7 - 7.7 K/uL 4.6 - 5.2  LYMPHSABS 0.7 - 4.0 K/uL 1.8 - 2.0     Body mass index is 41.16 kg/m.  Orders:  No orders of the defined types were placed in this encounter.  No orders of the defined types were placed in this encounter.    Procedures: Large Joint Inj: L knee on 03/22/2020 11:15 AM Indications: pain and diagnostic evaluation Details: 22 G 1.5 in needle, anteromedial approach  Arthrogram: No  Medications: 5 mL lidocaine (PF)  1 %; 40 mg methylPREDNISolone acetate 40 MG/ML Outcome: tolerated well, no immediate complications Procedure, treatment alternatives, risks and benefits explained, specific risks discussed. Consent was given by the patient. Immediately prior to procedure a time out was called to verify the correct patient, procedure, equipment, support staff and site/side marked as required. Patient was prepped and draped in the usual sterile fashion.      Clinical Data: No additional findings.  ROS:  All other systems negative, except as noted in the HPI. Review of Systems  Objective: Vital Signs: Ht 5\' 6"  (1.676 m)   Wt 255 lb (115.7 kg)   LMP 02/24/2020   BMI 41.16 kg/m   Specialty Comments:  No specialty comments available.  PMFS History: Patient Active  Problem List   Diagnosis Date Noted  . Family history of breast cancer   . Family history of throat cancer   . Family history of lung cancer   . Family history of colon cancer   . Malignant neoplasm of upper-outer quadrant of left breast in female, estrogen receptor positive ([facility]) 05/20/2019  . Arthritis 11/23/2017  . Right-sided low back pain without sciatica 12/31/2015  . Left knee pain 11/28/2014  . HTN (hypertension) 06/22/2013  . Genetic testing 06/22/2013   Past Medical History:  Diagnosis Date  . Arthritis   . Family history of breast cancer   . Family history of colon cancer   . Family history of lung cancer   . Family history of throat cancer   . Gall bladder disease    gall bladder removal  . Hypertension   . Personal history of radiation therapy     Family History  Problem Relation Age of Onset  . Hypertension Mother   . Heart disease Father   . Hypertension Father   . Colon cancer Father        dx 3s  . Stroke Maternal Grandmother   . Hypertension Maternal Grandmother   . Breast cancer Maternal Aunt        dx 68s  . Throat cancer Maternal Uncle   . Cancer Maternal Uncle   . Lung cancer Paternal Uncle   . Stomach cancer Neg Hx   . Esophageal cancer Neg Hx   . Rectal cancer Neg Hx   . Liver cancer Neg Hx     Past Surgical History:  Procedure Laterality Date  . BREAST BIOPSY Left 05/16/2019  . BREAST LUMPECTOMY Left 06/14/2019  . BREAST LUMPECTOMY WITH RADIOACTIVE SEED AND SENTINEL LYMPH NODE BIOPSY Left 06/14/2019   Procedure: LEFT BREAST LUMPECTOMY WITH RADIOACTIVE SEED AND SENTINEL LYMPH NODE BIOPSY;  Surgeon: [name], MD;  Location: [facility];  Service: General;  Laterality: Left;  . CESAREAN SECTION    . CHOLECYSTECTOMY    . TUBAL LIGATION     Social History   Occupational History  . Occupation: Librarian, academic  Tobacco Use  . Smoking status: Never Smoker  . Smokeless tobacco: Never Used  Vaping Use  . Vaping Use: Never  used  Substance and Sexual Activity  . Alcohol use: Yes    Alcohol/week: 2.0 standard drinks    Types: 2 Cans of beer per week    Comment: occasionally  . Drug use: No  . Sexual activity: Yes    Birth control/protection: Other-see comments, Surgical    Comment: Tubal ligation2

## 2020-04-06 ENCOUNTER — Ambulatory Visit (INDEPENDENT_AMBULATORY_CARE_PROVIDER_SITE_OTHER): Payer: 59

## 2020-04-06 ENCOUNTER — Other Ambulatory Visit: Payer: Self-pay

## 2020-04-06 DIAGNOSIS — Z23 Encounter for immunization: Secondary | ICD-10-CM

## 2020-04-06 NOTE — Progress Notes (Signed)
   Covid-19 Vaccination Clinic  Name:  Erin Gallagher    MRN: [id number] DOB: 1971/05/25  04/06/2020  [name] was observed post Covid-19 immunization for 15 minutes without incident. She was provided with Vaccine Information Sheet and instruction to access the V-Safe system.   [name] was instructed to call 911 with any severe reactions post vaccine: Marland Kitchen Difficulty breathing  . Swelling of face and throat  . A fast heartbeat  . A bad rash all over body  . Dizziness and weakness   Immunizations Administered    Name Date Dose VIS Date Route   Moderna COVID-19 Vaccine 04/06/2020 11:35 AM 0.5 mL 08/2019 Intramuscular   Manufacturer: Moderna   Lot: [id number]   [place]: [id number]

## 2020-04-12 DIAGNOSIS — H52203 Unspecified astigmatism, bilateral: Secondary | ICD-10-CM | POA: Diagnosis not present

## 2020-04-12 DIAGNOSIS — H5213 Myopia, bilateral: Secondary | ICD-10-CM | POA: Diagnosis not present

## 2020-04-12 DIAGNOSIS — H524 Presbyopia: Secondary | ICD-10-CM | POA: Diagnosis not present

## 2020-04-16 MED FILL — CYCLOBENZAPRINE HCL 5 MG TA: 5 | 10 days supply | Qty: 30 | Fill #2

## 2020-04-16 MED FILL — TAMOXIFEN 20 MG TABLET: 20 | 90 days supply | Qty: 90 | Fill #1

## 2020-04-26 ENCOUNTER — Encounter: Payer: Self-pay | Admitting: Family Medicine

## 2020-04-26 ENCOUNTER — Ambulatory Visit: Payer: 59 | Admitting: Family Medicine

## 2020-04-26 ENCOUNTER — Other Ambulatory Visit: Payer: Self-pay

## 2020-04-26 VITALS — BP 128/86 | HR 82 | Temp 98.1°F | Resp 14 | Ht 66.0 in | Wt 258.8 lb

## 2020-04-26 DIAGNOSIS — M7521 Bicipital tendinitis, right shoulder: Secondary | ICD-10-CM | POA: Diagnosis not present

## 2020-04-26 DIAGNOSIS — G8929 Other chronic pain: Secondary | ICD-10-CM

## 2020-04-26 DIAGNOSIS — M25562 Pain in left knee: Secondary | ICD-10-CM

## 2020-04-26 MED ORDER — METHYLPREDNISOLONE 4 MG PO TBPK: ORAL_TABLET | ORAL | 0 refills | Status: DC

## 2020-04-26 MED ORDER — CYCLOBENZAPRINE HCL 10 MG PO TABS: 5.0000 mg | ORAL_TABLET | Freq: Three times a day (TID) | ORAL | 0 refills | Status: DC | PRN

## 2020-04-26 MED FILL — CYCLOBENZAPRINE HCL 10 MG T: 10 | 20 days supply | Qty: 60 | Fill #0

## 2020-04-26 MED FILL — METHYLPREDNISOLONE 4 MG TBP: 4 | 6 days supply | Qty: 21 | Fill #0

## 2020-04-26 NOTE — Patient Instructions (Addendum)
If you have lab work done today you will be contacted with your lab results within the next 2 weeks.  If you have not heard from [place] then please contact us. The fastest way to get your results is to register for My Chart.   IF you received an x-ray today, you will receive an invoice from [facility]. Please contact [facility] at [phone] with questions or concerns regarding your invoice.   IF you received labwork today, you will receive an invoice from [facility]. Please contact [facility] at [phone] with questions or concerns regarding your invoice.   Our billing staff will not be able to assist you with questions regarding bills from these companies.  You will be contacted with the lab results as soon as they are available. The fastest way to get your results is to activate your My Chart account. Instructions are located on the last page of this paperwork. If you have not heard from [place] regarding the results in 2 weeks, please contact this office.     Proximal Biceps Tendinitis and Tenosynovitis  The proximal biceps tendon is a strong cord of tissue that connects the biceps muscle on the front of the upper arm to the shoulder blade. Tendinitis is inflammation of a tendon. Tenosynovitis is inflammation of the lining around the tendon (tendon sheath). These conditions often occur at the same time, and they can interfere with the ability to bend the elbow and turn the palm of the hand up. Proximal biceps tendinitis and tenosynovitis are usually caused by overusing the shoulder joint and the biceps muscle. These conditions usually heal within 6 weeks. Proximal biceps tendinitis may include a grade 1 or grade 2 strain of the tendon.  A grade 1 strain is mild, and it involves a slight pull of the tendon without any stretching or noticeable tearing of the tendon. There is usually no loss of biceps muscle strength.  A grade 2 strain is moderate, and it involves a small  tear in the tendon. The tendon is stretched, and biceps strength is usually decreased. What are the causes? This condition may be caused by:  A sudden increase in frequency or intensity of activity that involves the shoulder and the biceps muscle.  Overuse of the biceps muscle. This can happen when you do the same movements over and over, such as: ? Turning the palm of the hand up. ? Forceful straightening (hyperextension) of the elbow. ? Bending the elbow.  A direct, forceful hit or injury to the elbow. This is rare. What increases the risk? The following factors may make you more likely to develop this condition:  Playing contact sports.  Playing sports that involve throwing and overhead movements, including racket sports, gymnastics, weight lifting, or bodybuilding.  Doing physical labor.  Having poor strength and flexibility of the arm and shoulder. What are the signs or symptoms? Symptoms of this condition may include:  Pain and inflammation in the front of the shoulder.  A feeling of warmth in the front of the shoulder.  Limited range of motion of the shoulder and the elbow.  A crackling sound (crepitation) when you move or touch the shoulder or the upper arm. In some cases, symptoms may return after treatment, and they may be long-lasting (chronic). How is this diagnosed? This condition is diagnosed based on:  Your symptoms.  Your medical history.  Physical exam.  X-ray or MRI, if needed. How is this treated? Treatment for this condition depends on  the severity of your injury. It may include:  Resting the injured arm.  Icing the injured area.  Doing physical therapy. Your health care provider may also use:  Medicines to treat pain and inflammation.  Sound waves to treat the injured muscle (ultrasound therapy).  Medicines that are injected to the muscle (corticosteroids).  Medicines that numb the area (local anesthetics).  Surgery. This is done if  other treatments have not worked. Follow these instructions at home: Managing pain, stiffness, and swelling      If directed, put ice on the injured area. ? Put ice in a plastic bag. ? Place a towel between your skin and the bag. ? Leave the ice on for 20 minutes, 2-3 times a day.  If directed, apply heat to the affected area before you exercise. Use the heat source that your health care provider recommends, such as a moist heat pack or a heating pad. ? Place a towel between your skin and the heat source. ? Leave the heat on for 20-30 minutes. ? Remove the heat if your skin turns bright red. This is especially important if you are unable to feel pain, heat, or cold. You may have a greater risk of getting burned.  Move your fingers often to reduce stiffness and swelling.  Raise (elevate) the injured area above the level of your heart while you are lying down. Activity  Do not lift anything that is heavier than 10 lb (4.5 kg), or the limit that you are told, until your health care provider says that it is safe.  Avoid activities that cause pain or make your condition worse.  Return to your normal activities as told by your health care provider. Ask your health care provider what activities are safe for you.  Do exercises as told by your health care provider. General instructions  Take over-the-counter and prescription medicines only as told by your health care provider.  Do not use any products that contain nicotine or tobacco, such as cigarettes, e-cigarettes, and chewing tobacco. These can delay healing. If you need help quitting, ask your health care provider.  Keep all follow-up visits as told by your health care provider. This is important. How is this prevented?  Warm up and stretch before being active.  Cool down and stretch after being active.  Give your body time to rest between periods of activity.  Make sure any equipment that you use is fitted to you.  Be safe  and responsible while being active to avoid falls.  Maintain physical fitness, including: ? Strength. ? Flexibility. ? Heart health (cardiovascular fitness). ? The ability to use muscles for a long time (endurance). Contact a health care provider if:  You have symptoms that get worse or do not get better after 2 weeks of treatment.  You develop new symptoms. Get help right away if:  You develop severe pain. Summary  Tendinitis is inflammation of the biceps tendon. Tenosynovitis is inflammation of the lining around the biceps tendon. These conditions often occur at the same time.  These conditions are usually caused by overusing the shoulder joint and biceps muscle.  Symptoms include pain, warmth in the shoulder, and limited range of motion.  The two conditions are treated with rest, ice, medicines, and surgery (rare). This information is not intended to replace advice given to you by your health care provider. Make sure you discuss any questions you have with your health care provider. Document Revised: 12/14/2018 Document Reviewed: 10/14/2018 Elsevier Patient Education  2020 [employer]  

## 2020-04-26 NOTE — Progress Notes (Signed)
8/26/20213:35 PM  Erin Gallagher 01-Jun-1971, 49 y.o., female [id number]  Chief Complaint  Patient presents with  . Shoulder Pain    pt is having Rt shoulder pain that goes down her back and into both her legs  . Leg Pain    pt is having bilaterl leg pain with numbness below the knee if she stands for too long, this issue has been intermitent for 2 weeks     HPI:   Patient is a 49 y.o. female with past medical history significant for HTN who presents today for shoulder and leg pain  Right shoulder pain intermittent for couple of weeks More towards the front the shoulder At times it feels that times pain radiates down her back into her pelvis, gets really tight No neck pain Thought it was related to sleeping on her right side sleeping on her back, biofreeze, aspercream, APAP and naproxen has not helped Intermittent numbness, tingling in her hand Feels shoulder is week Right shoulder xray in 2019 - normal Saw Dr Sharol Given for left knee bakers cyst Treated with steroid injection Not sign better    Depression screen [facility] 2/9 04/26/2020 09/08/2019 03/08/2019  Decreased Interest 0 0 0  Down, Depressed, Hopeless 0 0 0  PHQ - 2 Score 0 0 0    Fall Risk  04/26/2020 03/01/2020 09/08/2019 03/08/2019 06/17/2018  Falls in the past year? 0 0 0 0 No  Number falls in past yr: 0 0 0 0 -  Injury with Fall? 0 0 0 0 -  Risk for fall due to : No Fall Risks - - - -  Follow up Falls evaluation completed - - - -     Allergies  Allergen Reactions  . Other Rash    Steri Strips cause itching and a rash    Prior to Admission medications   Medication Sig Start Date End Date Taking? Authorizing Provider  amLODipine (NORVASC) 10 MG tablet Take 1 tablet (10 mg total) by mouth daily. 03/01/20  Yes Rutherford Guys, MD  cyclobenzaprine (FLEXERIL) 5 MG tablet 1 pill by mouth up to every 8 hours as needed. 09/08/19  Yes Rutherford Guys, MD  losartan-hydrochlorothiazide (HYZAAR) 50-12.5 MG tablet Take 1 tablet by  mouth daily. 03/01/20  Yes Rutherford Guys, MD  naproxen (NAPROSYN) 500 MG tablet Take 1 tablet (500 mg total) by mouth 2 (two) times daily with a meal. 02/15/20  Yes Rutherford Guys, MD  tamoxifen (NOLVADEX) 20 MG tablet Take 1 tablet (20 mg total) by mouth daily. 08/29/19  Yes Nicholas Lose, MD    Past Medical History:  Diagnosis Date  . Arthritis   . Family history of breast cancer   . Family history of colon cancer   . Family history of lung cancer   . Family history of throat cancer   . Gall bladder disease    gall bladder removal  . Hypertension   . Personal history of radiation therapy     Past Surgical History:  Procedure Laterality Date  . BREAST BIOPSY Left 05/16/2019  . BREAST LUMPECTOMY Left 06/14/2019  . BREAST LUMPECTOMY WITH RADIOACTIVE SEED AND SENTINEL LYMPH NODE BIOPSY Left 06/14/2019   Procedure: LEFT BREAST LUMPECTOMY WITH RADIOACTIVE SEED AND SENTINEL LYMPH NODE BIOPSY;  Surgeon: [name], MD;  Location: [facility];  Service: General;  Laterality: Left;  . CESAREAN SECTION    . CHOLECYSTECTOMY    . TUBAL LIGATION      Social History  Tobacco Use  . Smoking status: Never Smoker  . Smokeless tobacco: Never Used  Substance Use Topics  . Alcohol use: Yes    Alcohol/week: 2.0 standard drinks    Types: 2 Cans of beer per week    Comment: occasionally    Family History  Problem Relation Age of Onset  . Hypertension Mother   . Heart disease Father   . Hypertension Father   . Colon cancer Father        dx 6s  . Stroke Maternal Grandmother   . Hypertension Maternal Grandmother   . Breast cancer Maternal Aunt        dx 51s  . Throat cancer Maternal Uncle   . Cancer Maternal Uncle   . Lung cancer Paternal Uncle   . Stomach cancer Neg Hx   . Esophageal cancer Neg Hx   . Rectal cancer Neg Hx   . Liver cancer Neg Hx     ROS Per hpi  OBJECTIVE:  Today's Vitals   04/26/20 1504  BP: 128/86  Pulse: 82  Resp: 14  Temp: 98.1  F (36.7 C)  TempSrc: Temporal  SpO2: 97%  Weight: 258 lb 12.8 oz (117.4 kg)  Height: 5\' 6"  (1.676 m)   Body mass index is 41.77 kg/m.   Physical Exam Vitals and nursing note reviewed.  Constitutional:      Appearance: She is well-developed.  HENT:     Head: Normocephalic and atraumatic.  Eyes:     General: No scleral icterus.    Conjunctiva/sclera: Conjunctivae normal.     Pupils: Pupils are equal, round, and reactive to light.  Pulmonary:     Effort: Pulmonary effort is normal.  Musculoskeletal:     Right shoulder: Tenderness (biceptial groove) present. No swelling or bony tenderness. Normal range of motion. Normal strength. Normal pulse.     Cervical back: Neck supple. No tenderness or bony tenderness. Normal range of motion.  Skin:    General: Skin is warm and dry.  Neurological:     Mental Status: She is alert and oriented to person, place, and time.     No results found for this or any previous visit (from the past 24 hour(s)).  No results found.   ASSESSMENT and PLAN  1. Biceps tendonitis on right Discussed supportive measures, new meds r/se/b and RTC precautions. Stop naproxen while taking methylprednisolone. Patient educational handout given. - Ambulatory referral to Physical Therapy  2. Chronic pain of left knee Fu with ortho  Other orders - methylPREDNISolone (MEDROL DOSEPAK) 4 MG TBPK tablet; Take per package instructions - cyclobenzaprine (FLEXERIL) 10 MG tablet; Take 0.5-1 tablets (5-10 mg total) by mouth 3 (three) times daily as needed for muscle spasms.  No follow-ups on file.    Rutherford Guys, MD [facility] [place], [place] [address] Ph.  [phone] Fax [phone]

## 2020-05-04 ENCOUNTER — Other Ambulatory Visit: Payer: Self-pay

## 2020-05-04 ENCOUNTER — Ambulatory Visit (INDEPENDENT_AMBULATORY_CARE_PROVIDER_SITE_OTHER): Payer: 59

## 2020-05-04 DIAGNOSIS — Z23 Encounter for immunization: Secondary | ICD-10-CM | POA: Diagnosis not present

## 2020-05-04 NOTE — Progress Notes (Signed)
   Covid-19 Vaccination Clinic  Name:  Erin Gallagher    MRN: [id number] DOB: 1971/03/21  05/04/2020  [name] was observed post Covid-19 immunization for 15 minutes without incident. She was provided with Vaccine Information Sheet and instruction to access the V-Safe system.   [name] was instructed to call 911 with any severe reactions post vaccine: Marland Kitchen Difficulty breathing  . Swelling of face and throat  . A fast heartbeat  . A bad rash all over body  . Dizziness and weakness   Immunizations Administered    Name Date Dose VIS Date Route   Moderna COVID-19 Vaccine 05/04/2020 11:56 AM 0.5 mL 08/2019 Intramuscular   Manufacturer: Moderna   Lot: [id number]   [place]: 73710-626-94

## 2020-05-08 MED FILL — AMLODIPINE BESYLATE 10 MG T: 10 | 90 days supply | Qty: 90 | Fill #0

## 2020-05-28 ENCOUNTER — Other Ambulatory Visit: Payer: Self-pay

## 2020-05-28 DIAGNOSIS — I890 Lymphedema, not elsewhere classified: Secondary | ICD-10-CM | POA: Diagnosis not present

## 2020-05-28 DIAGNOSIS — Z170 Estrogen receptor positive status [ER+]: Secondary | ICD-10-CM | POA: Diagnosis not present

## 2020-05-28 DIAGNOSIS — C50412 Malignant neoplasm of upper-outer quadrant of left female breast: Secondary | ICD-10-CM | POA: Diagnosis not present

## 2020-05-28 DIAGNOSIS — M5432 Sciatica, left side: Secondary | ICD-10-CM | POA: Diagnosis not present

## 2020-05-28 DIAGNOSIS — M5430 Sciatica, unspecified side: Secondary | ICD-10-CM

## 2020-05-29 NOTE — Progress Notes (Signed)
Patient Care Team: Rutherford Guys, MD as PCP - General (Family Medicine) [name], MD as Consulting Physician (General Surgery) Nicholas Lose, MD as Consulting Physician (Hematology and Oncology) Kyung Rudd, MD as Consulting Physician (Radiation Oncology)  DIAGNOSIS:    ICD-10-CM   1. Malignant neoplasm of upper-outer quadrant of left breast in female, estrogen receptor positive ([place])  C50.412    Z17.0     SUMMARY OF ONCOLOGIC HISTORY: Oncology History  Malignant neoplasm of upper-outer quadrant of left breast in female, estrogen receptor positive ([place])  05/20/2019 Initial Diagnosis   Routine screening mammogram detected a 1.4cm left breast mass at the 2:30 position, no axillary adenopathy. 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    06/05/2019 Genetic Testing   Negative genetic testing. No pathogenic variants identified on the [facility] Common Hereditary Cancers Panel + STAT Breast Cancer panel.The STAT Breast cancer panel offered by [facility] includes sequencing and rearrangement analysis for the following 9 genes:  ATM, BRCA1, BRCA2, CDH1, CHEK2, PALB2, PTEN, STK11 and TP53. The Common Hereditary Cancers Panel offered by [facility] includes sequencing and/or deletion duplication testing of the following 47 genes: APC, ATM, AXIN2, BARD1, BMPR1A, BRCA1, BRCA2, BRIP1, CDH1, CDKN2A (p14ARF), CDKN2A (p16INK4a), CKD4, CHEK2, CTNNA1, DICER1, EPCAM (Deletion/duplication testing only), GREM1 (promoter region deletion/duplication testing only), KIT, MEN1, MLH1, MSH2, MSH3, MSH6, MUTYH, NBN, NF1, NHTL1, PALB2, PDGFRA, PMS2, POLD1, POLE, PTEN, RAD50, RAD51C, RAD51D, SDHB, SDHC, SDHD, SMAD4, SMARCA4. STK11, TP53, TSC1, TSC2, and VHL.  The following genes were evaluated for sequence changes only: SDHA and HOXB13 c.251G>A variant only. The report date is 06/05/2019.    06/14/2019 Surgery   Left lumpectomy Barry Dienes) [phone]): IDC, grade 2, with high grade  DCIS, 1.7cm, clear margins, 2 left axillary lymph nodes negative for carcinoma.    06/24/2019 Oncotype testing   Recurrence score: 13; low risk, distant recurrence at 9 years: 4%   07/13/2019 - 08/31/2019 Radiation Therapy   The patient initially received a dose of 50.4 Gy in 28 fractions to the breast using whole-breast tangent fields. This was delivered using a 3-D conformal technique. The patient then received a boost to the seroma. This delivered an additional 10 Gy in 5 fractions using a 3-field photon boost technique. The total dose was 60.4 Gy.   09/2019 - 09/2024 Anti-estrogen oral therapy   Tamoxifen     CHIEF COMPLIANT: Follow-up of left breast cancer to discuss antiestrogen therapy  INTERVAL HISTORY: Erin Gallagher is a 49 y.o. with above-mentioned history of left breast cancer treated with left lumpectomy, radiation, and is currently on antiestrogen therapy with tamoxifen. She presents to the clinic today for follow-up.  She is tolerating tamoxifen fairly well with exception of right leg muscle aches and pains.  She is seeing orthopedics for this.  She denies any hot flashes.  She has taken the COVID-19 vaccine.  ALLERGIES:  is allergic to other.  MEDICATIONS:  Current Outpatient Medications  Medication Sig Dispense Refill  . amLODipine (NORVASC) 10 MG tablet Take 1 tablet (10 mg total) by mouth daily. 90 tablet 1  . cyclobenzaprine (FLEXERIL) 10 MG tablet Take 0.5-1 tablets (5-10 mg total) by mouth 3 (three) times daily as needed for muscle spasms. 60 tablet 0  . losartan-hydrochlorothiazide (HYZAAR) 50-12.5 MG tablet Take 1 tablet by mouth daily. 90 tablet 1  . methylPREDNISolone (MEDROL DOSEPAK) 4 MG TBPK tablet Take per package instructions 21 tablet 0  . naproxen (NAPROSYN) 500 MG  tablet Take 1 tablet (500 mg total) by mouth 2 (two) times daily with a meal. 60 tablet 1  . tamoxifen (NOLVADEX) 20 MG tablet Take 1 tablet (20 mg total) by mouth daily. 90 tablet 3   No  current facility-administered medications for this visit.    PHYSICAL EXAMINATION: ECOG PERFORMANCE STATUS: 1 - Symptomatic but completely ambulatory  There were no vitals filed for this visit. There were no vitals filed for this visit.  BREAST: No palpable masses or nodules in either right or left breasts. No palpable axillary supraclavicular or infraclavicular adenopathy no breast tenderness or nipple discharge. (exam performed in the presence of a chaperone)  LABORATORY DATA:  I have reviewed the data as listed CMP Latest Ref Rng & Units 03/01/2020 09/08/2019 05/25/2019  Glucose 65 - 99 mg/dL 87 131(H) 111(H)  BUN 6 - 24 mg/dL _0 Creatinine 0.57 - 1.00 mg/dL 0.74 0.69 0.75  Sodium 134 - 144 mmol/L 138 138 140  Potassium 3.5 - 5.2 mmol/L 4.3 3.8 4.1  Chloride 96 - 106 mmol/L 105 105 107  CO2 20 - 29 mmol/L _1 Calcium 8.7 - 10.2 mg/dL 8.7 8.9 8.9  Total Protein 6.0 - 8.5 g/dL 6.8 6.5 7.1  Total Bilirubin 0.0 - 1.2 mg/dL 0.3 <0.2 0.3  Alkaline Phos 48 - 121 IU/L 64 73 65  AST 0 - 40 IU/L 13 16 14(L)  ALT 0 - 32 IU/L _2 Lab Results  Component Value Date   WBC 7.0 05/25/2019   HGB 13.7 05/25/2019   HCT 41.8 05/25/2019   MCV 82.8 05/25/2019   PLT 344 05/25/2019   NEUTROABS 4.6 05/25/2019    ASSESSMENT & PLAN:  Malignant neoplasm of upper-outer quadrant of left breast in female, estrogen receptor positive ([place]) 05/20/2019:Routine screening mammogram detected a 1.4cm left breast mass at the 2:30 position, no axillary adenopathy. 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 T1CN0 stage Ia clinical stage  06/14/2019: Left lumpectomy: Grade 2 IDC, 1.7 cm, high-grade DCIS, margins negative, no lymphovascular or perineural invasion, 0/2 lymph nodes negative, ER 100%, PR 100%, HER-2 negative, Ki-67 10% Oncotype DX score: 13, low risk, risk of distant recurrence at 9 years 4% Genetic testing: Negative Adjuvant radiation therapy:  07/14/2019-08/29/2019  Current treatment: Adjuvant antiestrogen therapy with tamoxifen 20 mg daily, could switch to aromatase inhibitor once she is menopausal  Tamoxifen toxicities: Right lower extremity muscle aches and pains: I instructed her to take tamoxifen at bedtime and also to take vitamin D and tonic water.  Breast cancer surveillance: 1.  Mammogram scheduled for 06/15/2020 2. breast exam 05/30/2020: Benign  Return to clinic in 1 year for follow-up    No orders of the defined types were placed in this encounter.  The patient has a good understanding of the overall plan. she agrees with it. she will call with any problems that may develop before the next visit here.  Total time spent: 20 mins including face to face time and time spent for planning, charting and coordination of care  Nicholas Lose, MD 05/30/2020  I, Cloyde Reams Dorshimer, am acting as scribe for Dr. Nicholas Lose.  I have reviewed the above documentation for accuracy and completeness, and I agree with the above.

## 2020-05-30 ENCOUNTER — Other Ambulatory Visit: Payer: Self-pay

## 2020-05-30 ENCOUNTER — Encounter: Payer: Self-pay | Admitting: Physician Assistant

## 2020-05-30 ENCOUNTER — Ambulatory Visit: Payer: Self-pay

## 2020-05-30 ENCOUNTER — Ambulatory Visit (INDEPENDENT_AMBULATORY_CARE_PROVIDER_SITE_OTHER): Payer: 59 | Admitting: Physician Assistant

## 2020-05-30 ENCOUNTER — Telehealth: Payer: Self-pay | Admitting: Hematology and Oncology

## 2020-05-30 ENCOUNTER — Inpatient Hospital Stay: Payer: 59 | Attending: Hematology and Oncology | Admitting: Hematology and Oncology

## 2020-05-30 DIAGNOSIS — Z79810 Long term (current) use of selective estrogen receptor modulators (SERMs): Secondary | ICD-10-CM | POA: Insufficient documentation

## 2020-05-30 DIAGNOSIS — C50412 Malignant neoplasm of upper-outer quadrant of left female breast: Secondary | ICD-10-CM | POA: Diagnosis not present

## 2020-05-30 DIAGNOSIS — M5440 Lumbago with sciatica, unspecified side: Secondary | ICD-10-CM | POA: Diagnosis not present

## 2020-05-30 DIAGNOSIS — M79604 Pain in right leg: Secondary | ICD-10-CM | POA: Diagnosis not present

## 2020-05-30 DIAGNOSIS — Z791 Long term (current) use of non-steroidal anti-inflammatories (NSAID): Secondary | ICD-10-CM | POA: Insufficient documentation

## 2020-05-30 DIAGNOSIS — Z79899 Other long term (current) drug therapy: Secondary | ICD-10-CM | POA: Diagnosis not present

## 2020-05-30 DIAGNOSIS — Z923 Personal history of irradiation: Secondary | ICD-10-CM | POA: Diagnosis not present

## 2020-05-30 DIAGNOSIS — Z170 Estrogen receptor positive status [ER+]: Secondary | ICD-10-CM | POA: Diagnosis not present

## 2020-05-30 MED ORDER — PREDNISONE 10 MG PO TABS: 20.0000 mg | ORAL_TABLET | Freq: Every day | ORAL | 0 refills | Status: DC

## 2020-05-30 MED FILL — predniSONE 10 MG TABS: 10 | 30 days supply | Qty: 60 | Fill #0

## 2020-05-30 NOTE — Assessment & Plan Note (Signed)
05/20/2019:Routine screening mammogram detected a 1.4cm left breast mass at the 2:30 position, no axillary adenopathy. 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 T1CN0 stage Ia clinical stage  06/14/2019: Left lumpectomy: Grade 2 IDC, 1.7 cm, high-grade DCIS, margins negative, no lymphovascular or perineural invasion, 0/2 lymph nodes negative, ER 100%, PR 100%, HER-2 negative, Ki-67 10% Oncotype DX score: 13, low risk, risk of distant recurrence at 9 years 4% Genetic testing: Negative Adjuvant radiation therapy: 07/14/2019-08/29/2019  Current treatment: Adjuvant antiestrogen therapy with tamoxifen 20 mg daily, could switch to aromatase inhibitor once she is menopausal  Tamoxifen toxicities:  Breast cancer surveillance: 1.  Mammogram scheduled for 06/15/2020 2. breast exam 05/30/2020: Benign  Return to clinic in 1 year for follow-up

## 2020-05-30 NOTE — Telephone Encounter (Signed)
Scheduled appointments per 9/29 los. Gave patient calendar print out.

## 2020-05-30 NOTE — Progress Notes (Signed)
Office Visit Note   Patient: Erin Gallagher           Date of Birth: 1971/06/01           MRN: [id number] Visit Date: 05/30/2020              Requested by: Rutherford Guys, MD [address] [place],  [place] [address] PCP: Rutherford Guys, MD  Chief Complaint  Patient presents with  . Left Leg - Pain    Pain down lateral side of thigh and in the posterior calf. Denies back pain and injury, onset 3-4 weeks      HPI: This is a pleasant 49 year old woman who presents today with a chief complaint of a pain that starts in her buttock area and radiates down the side of her leg and into her calf.  She sometimes gets some numbness that goes into her foot.  She denies any injuries.  She also has a history of left knee arthritis and has gotten injections for this.  She states this pain seems to be different  Assessment & Plan: Visit Diagnoses:  1. Acute bilateral low back pain with sciatica, sciatica laterality unspecified     Plan: Findings consistent with sciatica.  We discussed the natural history of this as well as treatment options.  We will start with a course of prednisone to daily with breakfast.  She understands not to take any other anti-inflammatories at the same time.  If she is feeling well she can wean down to 1 a day.  We will follow up in 3 weeks.  If no improvement we would recommend an MRI for possible epidural steroid injection  Follow-Up Instructions: No follow-ups on file.   Ortho Exam  Patient is alert, oriented, no adenopathy, well-dressed, normal affect, normal respiratory effort. Examination of her back.  She has no deformity in her lower back that is palpable.  No tenderness.  With deep palpation over left buttock she does have some recreation of the symptoms that go down her leg.  Pain in her area is not increased with forward flexion or extension.  She has good strength with dorsiflexion and plantarflexion.  Negative Homans' sign.  She does have some recurrence of  her symptoms with a straight leg raise  Imaging: No results found. No images are attached to the encounter.  Labs: Lab Results  Component Value Date   HGBA1C 6.0 07/10/2014   REPTSTATUS 02/22/2020 FINAL 02/17/2020   GRAMSTAIN  02/17/2020    MODERATE WBC PRESENT,BOTH PMN AND MONONUCLEAR NO ORGANISMS SEEN    CULT  02/17/2020    No growth aerobically or anaerobically. Performed at [facility], [place] [address], [place], [place] [address]      Lab Results  Component Value Date   ALBUMIN 4.1 03/01/2020   ALBUMIN 3.9 09/08/2019   ALBUMIN 3.9 05/25/2019    No results found for: MG Lab Results  Component Value Date   VD25OH 25.1 (L) 12/03/2017    No results found for: PREALBUMIN CBC EXTENDED Latest Ref Rng & Units 05/25/2019 03/08/2019 11/23/2017  WBC 4.0 - 10.5 K/uL 7.0 6.7 7.8  RBC 3.87 - 5.11 MIL/uL 5.05 5.13 4.81  HGB 12.0 - 15.0 g/dL 13.7 13.8 13.4  HCT 36 - 46 % 41.8 41.1 38.9  PLT 150 - 400 K/uL 344 366 361  NEUTROABS 1.7 - 7.7 K/uL 4.6 - 5.2  LYMPHSABS 0.7 - 4.0 K/uL 1.8 - 2.0     There is  no height or weight on file to calculate BMI.  Orders:  Orders Placed This Encounter  Procedures  . XR Lumbar Spine 2-3 Views   No orders of the defined types were placed in this encounter.    Procedures: No procedures performed  Clinical Data: No additional findings.  ROS:  All other systems negative, except as noted in the HPI. Review of Systems  Objective: Vital Signs: There were no vitals taken for this visit.  Specialty Comments:  No specialty comments available.  PMFS History: Patient Active Problem List   Diagnosis Date Noted  . Family history of breast cancer   . Family history of throat cancer   . Family history of lung cancer   . Family history of colon cancer   . Malignant neoplasm of upper-outer quadrant of left breast in female, estrogen receptor positive ([place]) 05/20/2019  . Arthritis 11/23/2017  . Right-sided low back pain without  sciatica 12/31/2015  . Left knee pain 11/28/2014  . HTN (hypertension) 06/22/2013  . Genetic testing 06/22/2013   Past Medical History:  Diagnosis Date  . Arthritis   . Family history of breast cancer   . Family history of colon cancer   . Family history of lung cancer   . Family history of throat cancer   . Gall bladder disease    gall bladder removal  . Hypertension   . Personal history of radiation therapy     Family History  Problem Relation Age of Onset  . Hypertension Mother   . Heart disease Father   . Hypertension Father   . Colon cancer Father        dx 39s  . Stroke Maternal Grandmother   . Hypertension Maternal Grandmother   . Breast cancer Maternal Aunt        dx 2s  . Throat cancer Maternal Uncle   . Cancer Maternal Uncle   . Lung cancer Paternal Uncle   . Stomach cancer Neg Hx   . Esophageal cancer Neg Hx   . Rectal cancer Neg Hx   . Liver cancer Neg Hx     Past Surgical History:  Procedure Laterality Date  . BREAST BIOPSY Left 05/16/2019  . BREAST LUMPECTOMY Left 06/14/2019  . BREAST LUMPECTOMY WITH RADIOACTIVE SEED AND SENTINEL LYMPH NODE BIOPSY Left 06/14/2019   Procedure: LEFT BREAST LUMPECTOMY WITH RADIOACTIVE SEED AND SENTINEL LYMPH NODE BIOPSY;  Surgeon: [name], MD;  Location: [facility];  Service: General;  Laterality: Left;  . CESAREAN SECTION    . CHOLECYSTECTOMY    . TUBAL LIGATION     Social History   Occupational History  . Occupation: Librarian, academic  Tobacco Use  . Smoking status: Never Smoker  . Smokeless tobacco: Never Used  Vaping Use  . Vaping Use: Never used  Substance and Sexual Activity  . Alcohol use: Yes    Alcohol/week: 2.0 standard drinks    Types: 2 Cans of beer per week    Comment: occasionally  . Drug use: No  . Sexual activity: Yes    Birth control/protection: Other-see comments, Surgical    Comment: Tubal ligation2

## 2020-06-01 ENCOUNTER — Encounter: Payer: Self-pay | Admitting: Family Medicine

## 2020-06-01 ENCOUNTER — Ambulatory Visit (INDEPENDENT_AMBULATORY_CARE_PROVIDER_SITE_OTHER): Payer: 59 | Admitting: Family Medicine

## 2020-06-01 ENCOUNTER — Other Ambulatory Visit: Payer: Self-pay | Admitting: Family Medicine

## 2020-06-01 ENCOUNTER — Other Ambulatory Visit: Payer: Self-pay

## 2020-06-01 VITALS — BP 121/86 | HR 86 | Temp 97.6°F | Ht 67.0 in | Wt 252.8 lb

## 2020-06-01 DIAGNOSIS — E559 Vitamin D deficiency, unspecified: Secondary | ICD-10-CM | POA: Diagnosis not present

## 2020-06-01 DIAGNOSIS — N938 Other specified abnormal uterine and vaginal bleeding: Secondary | ICD-10-CM | POA: Diagnosis not present

## 2020-06-01 DIAGNOSIS — C50412 Malignant neoplasm of upper-outer quadrant of left female breast: Secondary | ICD-10-CM | POA: Diagnosis not present

## 2020-06-01 DIAGNOSIS — I10 Essential (primary) hypertension: Secondary | ICD-10-CM | POA: Diagnosis not present

## 2020-06-01 DIAGNOSIS — M5432 Sciatica, left side: Secondary | ICD-10-CM

## 2020-06-01 DIAGNOSIS — R0683 Snoring: Secondary | ICD-10-CM | POA: Diagnosis not present

## 2020-06-01 DIAGNOSIS — M7521 Bicipital tendinitis, right shoulder: Secondary | ICD-10-CM

## 2020-06-01 DIAGNOSIS — Z23 Encounter for immunization: Secondary | ICD-10-CM

## 2020-06-01 DIAGNOSIS — Z170 Estrogen receptor positive status [ER+]: Secondary | ICD-10-CM

## 2020-06-01 DIAGNOSIS — R5383 Other fatigue: Secondary | ICD-10-CM | POA: Diagnosis not present

## 2020-06-01 MED ORDER — LOSARTAN POTASSIUM-HCTZ 50-12.5 MG PO TABS: 1.0000 | ORAL_TABLET | Freq: Every day | ORAL | 1 refills | Status: DC

## 2020-06-01 MED ORDER — AMLODIPINE BESYLATE 10 MG PO TABS: 10.0000 mg | ORAL_TABLET | Freq: Every day | ORAL | 1 refills | Status: DC

## 2020-06-01 MED FILL — LOSARTAN-HCTZ 50-12.5 MG TA: 50-12.5 | 90 days supply | Qty: 90 | Fill #0

## 2020-06-01 NOTE — Progress Notes (Signed)
10/1/20218:55 AM  Erin Gallagher 06-03-1971, 49 y.o., female [id number]  Chief Complaint  Patient presents with  . right arm pain    6 m f/u     HPI:   Patient is a 49 y.o. female with past medical history significant for HTN, RIGHT breast cancer who presents today for routine followup  Last ov aug 2021 - PT for right shoulder (bicpes tenditinits) - medrol dose pak and flexeril, fu with ortho for backer cysts of left knee,  Saw once sept 2021 for yearly visit - on tamoxifen, ordered mammo for oct 15th 2021 Has been seeing ortho for sciatica, rx pred 20mg  daily onc recommended having vitamin D and K checked due to occ muscle cramps She reports vitamin D deficiency Feeling tired all the time, has h/o DUB, sometimes gets more than one period a month, does not skip months, not necessarily worse but has large clots, no h/o EBM, last pap April 2019, did not follow thru with referral to obgyn, no sign hotflashes She snores, does not wake up with headaches, if sits down long enough she could probably dose She has been working on losing weight, portion control and walking  Wt Readings from Last 3 Encounters:  06/01/20 252 lb 12.8 oz (114.7 kg)  05/30/20 253 lb 4.8 oz (114.9 kg)  04/26/20 258 lb 12.8 oz (117.4 kg)   BP Readings from Last 3 Encounters:  06/01/20 121/86  05/30/20 (!) 145/97  04/26/20 128/86   Lab Results  Component Value Date   CREATININE 0.74 03/01/2020   BUN 9 03/01/2020   NA 138 03/01/2020   K 4.3 03/01/2020   CL 105 03/01/2020   CO2 21 03/01/2020    Lab Results  Component Value Date   CHOL 170 03/01/2020   HDL 53 03/01/2020   LDLCALC 98 03/01/2020   TRIG 108 03/01/2020   CHOLHDL 3.2 03/01/2020   Depression screen PHQ 2/9 06/01/2020 04/26/2020 09/08/2019  Decreased Interest 0 0 0  Down, Depressed, Hopeless 0 0 0  PHQ - 2 Score 0 0 0    Fall Risk  04/26/2020 03/01/2020 09/08/2019 03/08/2019 06/17/2018  Falls in the past year? 0 0 0 0 No  Number falls in  past yr: 0 0 0 0 -  Injury with Fall? 0 0 0 0 -  Risk for fall due to : No Fall Risks - - - -  Follow up Falls evaluation completed - - - -     Allergies  Allergen Reactions  . Other Rash    Steri Strips cause itching and a rash    Prior to Admission medications   Medication Sig Start Date End Date Taking? Authorizing Provider  amLODipine (NORVASC) 10 MG tablet Take 1 tablet (10 mg total) by mouth daily. 03/01/20  Yes Rutherford Guys, MD  cyclobenzaprine (FLEXERIL) 10 MG tablet Take 0.5-1 tablets (5-10 mg total) by mouth 3 (three) times daily as needed for muscle spasms. 04/26/20  Yes Rutherford Guys, MD  losartan-hydrochlorothiazide (HYZAAR) 50-12.5 MG tablet Take 1 tablet by mouth daily. 03/01/20  Yes Rutherford Guys, MD  naproxen (NAPROSYN) 500 MG tablet Take 1 tablet (500 mg total) by mouth 2 (two) times daily with a meal. 02/15/20  Yes Rutherford Guys, MD  predniSONE (DELTASONE) 10 MG tablet Take 2 tablets (20 mg total) by mouth daily with breakfast. 05/30/20  Yes Persons, Bevely Palmer, PA  tamoxifen (NOLVADEX) 20 MG tablet Take 1 tablet (20 mg total) by mouth daily.  08/29/19  Yes Nicholas Lose, MD    Past Medical History:  Diagnosis Date  . Arthritis   . Family history of breast cancer   . Family history of colon cancer   . Family history of lung cancer   . Family history of throat cancer   . Gall bladder disease    gall bladder removal  . Hypertension   . Personal history of radiation therapy     Past Surgical History:  Procedure Laterality Date  . BREAST BIOPSY Left 05/16/2019  . BREAST LUMPECTOMY Left 06/14/2019  . BREAST LUMPECTOMY WITH RADIOACTIVE SEED AND SENTINEL LYMPH NODE BIOPSY Left 06/14/2019   Procedure: LEFT BREAST LUMPECTOMY WITH RADIOACTIVE SEED AND SENTINEL LYMPH NODE BIOPSY;  Surgeon: [name], MD;  Location: [facility];  Service: General;  Laterality: Left;  . CESAREAN SECTION    . CHOLECYSTECTOMY    . TUBAL LIGATION      Social  History   Tobacco Use  . Smoking status: Never Smoker  . Smokeless tobacco: Never Used  Substance Use Topics  . Alcohol use: Yes    Alcohol/week: 2.0 standard drinks    Types: 2 Cans of beer per week    Comment: occasionally    Family History  Problem Relation Age of Onset  . Hypertension Mother   . Heart disease Father   . Hypertension Father   . Colon cancer Father        dx 82s  . Stroke Maternal Grandmother   . Hypertension Maternal Grandmother   . Breast cancer Maternal Aunt        dx 49s  . Throat cancer Maternal Uncle   . Cancer Maternal Uncle   . Lung cancer Paternal Uncle   . Stomach cancer Neg Hx   . Esophageal cancer Neg Hx   . Rectal cancer Neg Hx   . Liver cancer Neg Hx     Review of Systems  Constitutional: Negative for chills and fever.  Respiratory: Negative for cough and shortness of breath.   Cardiovascular: Negative for chest pain, palpitations and leg swelling.  Gastrointestinal: Negative for abdominal pain, nausea and vomiting.   Per hpi  OBJECTIVE:  Today's Vitals   06/01/20 0826  BP: 121/86  Pulse: 86  Temp: 97.6 F (36.4 C)  TempSrc: Temporal  SpO2: 98%  Weight: 252 lb 12.8 oz (114.7 kg)  Height: 5\' 7"  (1.702 m)   Body mass index is 39.59 kg/m.   Physical Exam Vitals and nursing note reviewed.  Constitutional:      Appearance: She is well-developed.  HENT:     Head: Normocephalic and atraumatic.     Mouth/Throat:     Pharynx: No oropharyngeal exudate.  Eyes:     General: No scleral icterus.    Extraocular Movements: Extraocular movements intact.     Conjunctiva/sclera: Conjunctivae normal.     Pupils: Pupils are equal, round, and reactive to light.  Cardiovascular:     Rate and Rhythm: Normal rate and regular rhythm.     Heart sounds: Normal heart sounds. No murmur heard.  No friction rub. No gallop.   Pulmonary:     Effort: Pulmonary effort is normal.     Breath sounds: Normal breath sounds. No wheezing, rhonchi or  rales.  Musculoskeletal:     Cervical back: Neck supple.  Skin:    General: Skin is warm and dry.  Neurological:     Mental Status: She is alert and oriented to person, place, and  time.     No results found for this or any previous visit (from the past 24 hour(s)).  No results found.   ASSESSMENT and PLAN  1. Primary hypertension Controlled. Continue current regime.  - Basic Metabolic Panel  2. Vitamin D deficiency Checking labs today, medications will be adjusted as needed.  - Vitamin D, 25-hydroxy  3. Malignant neoplasm of upper-outer quadrant of left breast in female, estrogen receptor positive ([place]) Managed by onc  4. Biceps tendonitis on right 5. Sciatica of left side Shoulder better, will be doing PT for sciatica, will re-eval need for PT for shoulder  6. Fatigue, unspecified type - TSH - Hemoglobin A1c  7. Snoring If normal labs, order overnight pulse ox  8. DUB (dysfunctional uterine bleeding) Patient on tamoxifen, discussed importance for gyn eval, referring again. Labs and [place] pending. - Ambulatory referral to Obstetrics / Gynecology - CBC - TSH - US Pelvic Complete With Transvaginal; Future  Other orders - Flu Vaccine QUAD 36+ mos IM - amLODipine (NORVASC) 10 MG tablet; Take 1 tablet (10 mg total) by mouth daily. - losartan-hydrochlorothiazide (HYZAAR) 50-12.5 MG tablet; Take 1 tablet by mouth daily.  Return in about 2 months (around 08/01/2020) for fatigue/labs/DUB.    Rutherford Guys, MD Primary Care at [facility] [place], [place] [address] Ph.  [phone] Fax [phone]

## 2020-06-01 NOTE — Patient Instructions (Signed)
° ° ° °  If you have lab work done today you will be contacted with your lab results within the next 2 weeks.  If you have not heard from us then please contact us. The fastest way to get your results is to register for My Chart. ° ° °IF you received an x-ray today, you will receive an invoice from [facility]. Please contact [facility] at [phone] with questions or concerns regarding your invoice.  ° °IF you received labwork today, you will receive an invoice from [facility]. Please contact [facility] at [phone] with questions or concerns regarding your invoice.  ° °Our billing staff will not be able to assist you with questions regarding bills from these companies. ° °You will be contacted with the lab results as soon as they are available. The fastest way to get your results is to activate your My Chart account. Instructions are located on the last page of this paperwork. If you have not heard from us regarding the results in 2 weeks, please contact this office. °  ° ° ° °

## 2020-06-02 LAB — BASIC METABOLIC PANEL
BUN/Creatinine Ratio: 11 (ref 9–23)
BUN: 8 mg/dL (ref 6–24)
CO2: 22 mmol/L (ref 20–29)
Calcium: 8.7 mg/dL (ref 8.7–10.2)
Chloride: 101 mmol/L (ref 96–106)
Creatinine, Ser: 0.73 mg/dL (ref 0.57–1.00)
GFR calc Af Amer: 112 mL/min/{1.73_m2} (ref >59)
GFR calc non Af Amer: 97 mL/min/{1.73_m2} (ref >59)
Glucose: 90 mg/dL (ref 65–99)
Potassium: 4.4 mmol/L (ref 3.5–5.2)
Sodium: 139 mmol/L (ref 134–144)

## 2020-06-02 LAB — CBC
Hematocrit: 41.8 % (ref 34.0–46.6)
Hemoglobin: 13.4 g/dL (ref 11.1–15.9)
MCH: 26.6 pg (ref 26.6–33.0)
MCHC: 32.1 g/dL (ref 31.5–35.7)
MCV: 83 fL (ref 79–97)
Platelets: 339 10*3/uL (ref 150–450)
RBC: 5.03 x10E6/uL (ref 3.77–5.28)
RDW: 14 % (ref 11.7–15.4)
WBC: 6.7 10*3/uL (ref 3.4–10.8)

## 2020-06-02 LAB — HEMOGLOBIN A1C
Est. average glucose Bld gHb Est-mCnc: 128 mg/dL
Hgb A1c MFr Bld: 6.1 % — ABNORMAL HIGH (ref 4.8–5.6)

## 2020-06-02 LAB — TSH: TSH: 1.43 u[IU]/mL (ref 0.450–4.500)

## 2020-06-02 LAB — VITAMIN D 25 HYDROXY (VIT D DEFICIENCY, FRACTURES): Vit D, 25-Hydroxy: 33.3 ng/mL (ref 30.0–100.0)

## 2020-06-06 ENCOUNTER — Ambulatory Visit
Admission: RE | Admit: 2020-06-06 | Discharge: 2020-06-06 | Disposition: A | Discharge status: Home / Self Care | Payer: 59 | Source: Ambulatory Visit | Attending: Family Medicine | Admitting: Family Medicine

## 2020-06-06 DIAGNOSIS — D250 Submucous leiomyoma of uterus: Secondary | ICD-10-CM | POA: Diagnosis not present

## 2020-06-06 DIAGNOSIS — N938 Other specified abnormal uterine and vaginal bleeding: Secondary | ICD-10-CM

## 2020-06-08 ENCOUNTER — Telehealth: Payer: Self-pay | Admitting: Family Medicine

## 2020-06-08 NOTE — Telephone Encounter (Signed)
patient called per CRM left at lunch that she had a missed call from our office . Unable to find documentation

## 2020-06-14 ENCOUNTER — Other Ambulatory Visit: Payer: Self-pay

## 2020-06-14 ENCOUNTER — Encounter: Payer: Self-pay | Admitting: Physical Therapy

## 2020-06-14 ENCOUNTER — Ambulatory Visit: Payer: 59 | Admitting: Physical Therapy

## 2020-06-14 DIAGNOSIS — M5442 Lumbago with sciatica, left side: Secondary | ICD-10-CM | POA: Diagnosis not present

## 2020-06-14 DIAGNOSIS — G8929 Other chronic pain: Secondary | ICD-10-CM

## 2020-06-14 NOTE — Therapy (Signed)
[facility] [address] [place], [place], [address] Phone: [phone]   Fax:  [phone]  Physical Therapy Evaluation  Patient Details  Name: Erin Gallagher MRN: [id number] Date of Birth: 10/07/1970 Referring Provider (PT): Sharol Given, MD   Encounter Date: 06/14/2020   PT End of Session - 06/14/20 1257    Visit Number 1    Number of Visits 12    Date for PT Re-Evaluation 08/09/20    PT Start Time 1200    PT Stop Time 1230    PT Time Calculation (min) 30 min    Activity Tolerance Patient tolerated treatment well    Behavior During Therapy [facility] for tasks assessed/performed           Past Medical History:  Diagnosis Date  . Arthritis   . Family history of breast cancer   . Family history of colon cancer   . Family history of lung cancer   . Family history of throat cancer   . Gall bladder disease    gall bladder removal  . Hypertension   . Personal history of radiation therapy     Past Surgical History:  Procedure Laterality Date  . BREAST BIOPSY Left 05/16/2019  . BREAST LUMPECTOMY Left 06/14/2019  . BREAST LUMPECTOMY WITH RADIOACTIVE SEED AND SENTINEL LYMPH NODE BIOPSY Left 06/14/2019   Procedure: LEFT BREAST LUMPECTOMY WITH RADIOACTIVE SEED AND SENTINEL LYMPH NODE BIOPSY;  Surgeon: [name], MD;  Location: [facility];  Service: General;  Laterality: Left;  . CESAREAN SECTION    . CHOLECYSTECTOMY    . TUBAL LIGATION      There were no vitals filed for this visit.    Subjective Assessment - 06/14/20 1202    Subjective She relays back pain with Lt leg radiculopathy for months. Insidious onset.    How long can you stand comfortably? hour    Diagnostic tests "She does have some spurring and early degenerative changes on recent XR.  No acute osseous changes"    Patient Stated Goals reduce pain    Currently in Pain? Yes    Pain Score 7     Pain Location Back    Pain Orientation Lower    Pain Descriptors /  Indicators Aching;Burning;Tingling;Numbness    Pain Type Chronic pain;Other (Comment)   subacute   Pain Radiating Towards down Lt leg to foot    Pain Onset More than a month ago    Pain Frequency Intermittent    Aggravating Factors  standing and walking    Pain Relieving Factors pain meds    Multiple Pain Sites No              [facility] PT Assessment - 06/14/20 0001      Assessment   Medical Diagnosis LBP with Lt sciatica    Referring Provider (PT) Sharol Given, MD    Onset Date/Surgical Date --   3 month onset of pain   Next MD Visit 10/20    Prior Therapy none for her back      Precautions   Precautions None      Restrictions   Weight Bearing Restrictions No      Balance Screen   Has the patient fallen in the past 6 months No    Has the patient had a decrease in activity level because of a fear of falling?  No    Is the patient reluctant to leave their home because of a fear of falling?  No  Home Environment   Living Environment Private residence      Prior Function   Level of Independence Independent    Vocation Full time employment    Vocation Requirements endoscope tech    Leisure movies      Cognition   Overall Cognitive Status Within Functional Limits for tasks assessed      Observation/Other Assessments   Focus on Therapeutic Outcomes (FOTO)  2nd visit as she was 15 min late      ROM / Strength   AROM / PROM / Strength AROM;Strength      AROM   AROM Assessment Site Lumbar    Lumbar Flexion 75%    Lumbar Extension 75%    Lumbar - Right Side Bend 75%    Lumbar - Left Side Bend 75%    Lumbar - Right Rotation 75%    Lumbar - Left Rotation 75%      Strength   Overall Strength Comments tested in sitting    Strength Assessment Site Hip;Knee    Right/Left Hip Right;Left    Right Hip Flexion 4/5    Right Hip ABduction 4/5    Left Hip Flexion 4-/5    Left Hip ABduction 4/5    Right/Left Knee Right;Left    Right Knee Flexion 4+/5    Right Knee Extension 5/5     Left Knee Flexion 4+/5    Left Knee Extension 4+/5      Palpation   Palpation comment TTP lower lumbar/upper sacral segments. T.P. in glutes/piriformis with increased tightness on Lt      Special Tests   Other special tests + slump test on Lt that was neg on Rt. + SLR test on Lt that was neg on Rt.                      Objective measurements completed on examination: See above findings.       [facility] Adult PT Treatment/Exercise - 06/14/20 0001      Modalities   Modalities Moist Heat      Moist Heat Therapy   Number Minutes Moist Heat 10 Minutes    Moist Heat Location Lumbar Spine                  PT Education - 06/14/20 1257    Education Details HEP, POC, exam findings, heat vs ice    Person(s) Educated Patient    Methods Explanation;Demonstration;Verbal cues;Handout    Comprehension Verbalized understanding;Need further instruction            PT Short Term Goals - 06/14/20 1301      PT SHORT TERM GOAL #1   Title Pt will be I and compliant with HEP.    Time 4    Period Weeks    Status New    Target Date 07/12/20      PT SHORT TERM GOAL #2   Title will do FOTO and set goal             PT Long Term Goals - 06/14/20 1301      PT LONG TERM GOAL #1   Title Pt will improve lumbar ROM to WNL    Time 8    Period Weeks    Status New    Target Date 08/09/20      PT LONG TERM GOAL #2   Title Pt will improve hip/knee strength to 5- to improve function.    Time 8  Period Weeks    Status New      PT LONG TERM GOAL #3   Title Pt will reduce pain to no more than 3/10 with standing/walking greater than one hour.    Time 8    Period Weeks    Status New      PT LONG TERM GOAL #4   Title -                  Plan - 06/14/20 1258    Clinical Impression Statement Pt presents with subacute LBP with Lt sciatica. She will benefit from skilled PT to address her deficits in Lt leg strength, lumbar ROM, and decreased activity  tolerance for standing and walking.    Personal Factors and Comorbidities Comorbidity 2    Comorbidities OA,HTN    Examination-Activity Limitations Stand;Lift;Squat;Locomotion Level    Examination-Participation Restrictions Cleaning;Community Activity;Shop;Laundry;Occupation    Stability/Clinical Decision Making Evolving/Moderate complexity    Clinical Decision Making Moderate    Rehab Potential Good    PT Frequency 2x / week   1-2   PT Duration 8 weeks    PT Treatment/Interventions ADLs/Self Care Home Management;Electrical Stimulation;Iontophoresis 4mg /ml Dexamethasone;Moist Heat;Traction;Ultrasound;Therapeutic activities;Therapeutic exercise;Balance training;Neuromuscular re-education;Manual techniques;Passive range of motion;Dry needling;Joint Manipulations;Spinal Manipulations;Taping    PT Next Visit Plan needs to complete FOTO, review and update HEP PRN. needs core strength and hip strength.    PT Home Exercise Plan Access Code: [email or web address]    Consulted and Agree with Plan of Care Patient           Patient will benefit from skilled therapeutic intervention in order to improve the following deficits and impairments:  Decreased activity tolerance, Decreased endurance, Decreased mobility, Decreased strength, Difficulty walking, Impaired flexibility, Increased muscle spasms, Pain, Postural dysfunction  Visit Diagnosis: Chronic bilateral low back pain with left-sided sciatica     Problem List Patient Active Problem List   Diagnosis Date Noted  . Family history of breast cancer   . Family history of throat cancer   . Family history of lung cancer   . Family history of colon cancer   . Malignant neoplasm of upper-outer quadrant of left breast in female, estrogen receptor positive ([place]) 05/20/2019  . Arthritis 11/23/2017  . Right-sided low back pain without sciatica 12/31/2015  . Left knee pain 11/28/2014  . HTN (hypertension) 06/22/2013  . Genetic testing 06/22/2013    Debbe Odea ,PT,DPT 06/14/2020, 1:06 PM  [facility] [address] [place], [place], [address] Phone: [phone]   Fax:  [phone]  Name: ALEXANDRINA FIORINI MRN: [id number] Date of Birth: 1970-09-16

## 2020-06-14 NOTE — Patient Instructions (Signed)
Access Code: [email or web address] URL: [email or web address] Date: 06/14/2020 Prepared by: Elsie Ra  Exercises Seated Piriformis Stretch - 2 x daily - 6 x weekly - 1 sets - 2 reps - 30 hold Seated Sciatic Tensioner - 2 x daily - 6 x weekly - 1 sets - 10 reps - 3 hold Supine Bridge - 2 x daily - 6 x weekly - 1-2 sets - 10 reps - 5 hold Single Knee to Chest Stretch - 2 x daily - 6 x weekly - 2 reps - 1 sets - 30 hold Straight Leg Raise - 2 x daily - 6 x weekly - 2-3 sets - 10 reps

## 2020-06-15 ENCOUNTER — Ambulatory Visit
Admission: RE | Admit: 2020-06-15 | Discharge: 2020-06-15 | Disposition: A | Discharge status: Home / Self Care | Payer: 59 | Source: Ambulatory Visit | Attending: Adult Health | Admitting: Adult Health

## 2020-06-15 DIAGNOSIS — Z170 Estrogen receptor positive status [ER+]: Secondary | ICD-10-CM

## 2020-06-15 DIAGNOSIS — R928 Other abnormal and inconclusive findings on diagnostic imaging of breast: Secondary | ICD-10-CM | POA: Diagnosis not present

## 2020-06-15 DIAGNOSIS — Z853 Personal history of malignant neoplasm of breast: Secondary | ICD-10-CM | POA: Diagnosis not present

## 2020-06-15 DIAGNOSIS — C50412 Malignant neoplasm of upper-outer quadrant of left female breast: Secondary | ICD-10-CM

## 2020-06-20 ENCOUNTER — Ambulatory Visit: Payer: 59 | Admitting: Physician Assistant

## 2020-06-26 ENCOUNTER — Encounter: Payer: 59 | Admitting: Physical Therapy

## 2020-07-02 ENCOUNTER — Encounter: Payer: 59 | Admitting: Physical Therapy

## 2020-07-09 ENCOUNTER — Encounter: Payer: 59 | Admitting: Physical Therapy

## 2020-07-09 ENCOUNTER — Telehealth: Payer: Self-pay | Admitting: Physical Therapy

## 2020-07-09 NOTE — Telephone Encounter (Signed)
Pt no show for PT appointment today. They were contacted and informed of this via voicemail. They were provided the date and time of their next appointment on voicemail. They were instructed to call us to let us know if they cannot make their appointment.  Elsie Ra, PT, DPT 07/09/20 3:37 PM

## 2020-07-16 ENCOUNTER — Encounter: Payer: 59 | Admitting: Physical Therapy

## 2020-07-23 ENCOUNTER — Ambulatory Visit (INDEPENDENT_AMBULATORY_CARE_PROVIDER_SITE_OTHER): Payer: 59 | Admitting: Physical Therapy

## 2020-07-23 ENCOUNTER — Other Ambulatory Visit: Payer: Self-pay

## 2020-07-23 ENCOUNTER — Encounter: Payer: Self-pay | Admitting: Physical Therapy

## 2020-07-23 DIAGNOSIS — G8929 Other chronic pain: Secondary | ICD-10-CM

## 2020-07-23 DIAGNOSIS — M5442 Lumbago with sciatica, left side: Secondary | ICD-10-CM | POA: Diagnosis not present

## 2020-07-23 DIAGNOSIS — R293 Abnormal posture: Secondary | ICD-10-CM | POA: Diagnosis not present

## 2020-07-23 NOTE — Therapy (Addendum)
[facility] [address] [place], [place], [address] Phone: [phone]   Fax:  [phone]  Physical Therapy Treatment/Discharge addendum PHYSICAL THERAPY DISCHARGE SUMMARY  Visits from Start of Care: 2  Current functional level related to goals / functional outcomes: See below   Remaining deficits: See below   Education / Equipment: HEP Plan: Patient agrees to discharge.  Patient goals were not met. Patient is being discharged due to not returning since the last visit.  ?????  Elsie Ra, PT, DPT 10/09/20 12:03 PM      Patient Details  Name: Erin Gallagher MRN: [id number] Date of Birth: 05-02-1971 Referring Provider (PT): Sharol Given, MD   Encounter Date: 07/23/2020   PT End of Session - 07/23/20 1604    Visit Number 2    Number of Visits 12    Date for PT Re-Evaluation 08/09/20    PT Start Time 1520    PT Stop Time 1600    PT Time Calculation (min) 40 min    Activity Tolerance Patient tolerated treatment well    Behavior During Therapy [facility] for tasks assessed/performed           Past Medical History:  Diagnosis Date  . Arthritis   . Family history of breast cancer   . Family history of colon cancer   . Family history of lung cancer   . Family history of throat cancer   . Gall bladder disease    gall bladder removal  . Hypertension   . Personal history of radiation therapy     Past Surgical History:  Procedure Laterality Date  . BREAST BIOPSY Left 05/16/2019  . BREAST LUMPECTOMY Left 06/14/2019  . BREAST LUMPECTOMY WITH RADIOACTIVE SEED AND SENTINEL LYMPH NODE BIOPSY Left 06/14/2019   Procedure: LEFT BREAST LUMPECTOMY WITH RADIOACTIVE SEED AND SENTINEL LYMPH NODE BIOPSY;  Surgeon: [name], MD;  Location: [facility];  Service: General;  Laterality: Left;  . CESAREAN SECTION    . CHOLECYSTECTOMY    . TUBAL LIGATION      There were no vitals filed for this visit.   Subjective Assessment -  07/23/20 1535    Subjective relays no radiculopathy down her leg today. Overall back pain is about 5/10    How long can you stand comfortably? hour    Diagnostic tests "She does have some spurring and early degenerative changes on recent XR.  No acute osseous changes"    Patient Stated Goals reduce pain    Pain Onset More than a month ago              [facility] PT Assessment - 07/23/20 0001      Assessment   Medical Diagnosis LBP with Lt sciatica    Referring Provider (PT) Sharol Given, MD      AROM   Lumbar Flexion 75%    Lumbar Extension 75%    Lumbar - Right Side Bend 75%    Lumbar - Left Side Bend 75%    Lumbar - Right Rotation 75%    Lumbar - Left Rotation 75%            [facility] Adult PT Treatment/Exercise - 07/23/20 0001      Exercises   Exercises Lumbar      Lumbar Exercises: Stretches   Active Hamstring Stretch Right;Left;2 reps;30 seconds    Single Knee to Chest Stretch Right;Left;2 reps;30 seconds    Double Knee to Chest Stretch Limitations with feet on pball 5 sec X 10  reps    Lower Trunk Rotation 5 reps;10 seconds    Piriformis Stretch Right;Left;2 reps;30 seconds      Lumbar Exercises: Aerobic   Recumbent Bike L2 X 5 min      Lumbar Exercises: Standing   Row 20 reps    Theraband Level (Row) Level 2 (Red)    Shoulder Extension 20 reps    Theraband Level (Shoulder Extension) Level 2 (Red)      Lumbar Exercises: Supine   Clam 20 reps    Clam Limitations green    Bridge 10 reps;5 seconds    Straight Leg Raise 10 reps   2 sets bilat   Other Supine Lumbar Exercises isometric ab set using pball to push UE down into 5 sec X 15 reps                    PT Short Term Goals - 07/23/20 1606      PT SHORT TERM GOAL #1   Title Pt will be I and compliant with HEP.    Time 4    Period Weeks    Status On-going    Target Date 07/12/20      PT SHORT TERM GOAL #2   Title -             PT Long Term Goals - 07/23/20 1606      PT LONG TERM GOAL #1    Title Pt will improve lumbar ROM to WNL    Time 8    Period Weeks    Status On-going      PT LONG TERM GOAL #2   Title Pt will improve hip/knee strength to 5- to improve function.    Time 8    Period Weeks    Status On-going      PT LONG TERM GOAL #3   Title Pt will reduce pain to no more than 3/10 with standing/walking greater than one hour.    Time 8    Period Weeks    Status On-going      PT LONG TERM GOAL #4   Title -                 Plan - 07/23/20 1605    Clinical Impression Statement Session focused on stretching and strengthening for core/lumbar and hips. She had good overall exercise tolerance to this. She was given education on when to use heat vs ice. Continue POC    Personal Factors and Comorbidities Comorbidity 2    Comorbidities OA,HTN    Examination-Activity Limitations Stand;Lift;Squat;Locomotion Level    Examination-Participation Restrictions Cleaning;Community Activity;Shop;Laundry;Occupation    Stability/Clinical Decision Making Evolving/Moderate complexity    Rehab Potential Good    PT Frequency 2x / week   1-2   PT Duration 8 weeks    PT Treatment/Interventions ADLs/Self Care Home Management;Electrical Stimulation;Iontophoresis 80m/ml Dexamethasone;Moist Heat;Traction;Ultrasound;Therapeutic activities;Therapeutic exercise;Balance training;Neuromuscular re-education;Manual techniques;Passive range of motion;Dry needling;Joint Manipulations;Spinal Manipulations;Taping    PT Next Visit Plan review and update HEP PRN. needs core strength and hip strength.    PT Home Exercise Plan Access Code: [email or web address]    [email or web address] Agree with Plan of Care Patient           Patient will benefit from skilled therapeutic intervention in order to improve the following deficits and impairments:  Decreased activity tolerance, Decreased endurance, Decreased mobility, Decreased strength, Difficulty walking, Impaired flexibility, Increased muscle spasms, Pain, Postural  dysfunction  Visit Diagnosis: Chronic bilateral low back pain with  left-sided sciatica  Abnormal posture     Problem List Patient Active Problem List   Diagnosis Date Noted  . Family history of breast cancer   . Family history of throat cancer   . Family history of lung cancer   . Family history of colon cancer   . Malignant neoplasm of upper-outer quadrant of left breast in female, estrogen receptor positive ([place]) 05/20/2019  . Arthritis 11/23/2017  . Right-sided low back pain without sciatica 12/31/2015  . Left knee pain 11/28/2014  . HTN (hypertension) 06/22/2013  . Genetic testing 06/22/2013    Silvestre Mesi 07/23/2020, 4:07 PM  [facility] [address] [place], [place], [address] Phone: [phone]   Fax:  [phone]  Name: JOSSIE SMOOT MRN: [id number] Date of Birth: 1971-08-11

## 2020-08-01 ENCOUNTER — Ambulatory Visit: Payer: 59 | Admitting: Registered Nurse

## 2020-09-09 MED FILL — NAPROXEN 500 MG TABS: 500 | 30 days supply | Qty: 60 | Fill #1

## 2020-09-09 MED FILL — AMLODIPINE BESYLATE 10 MG T: 10 | 90 days supply | Qty: 90 | Fill #1

## 2020-09-27 ENCOUNTER — Other Ambulatory Visit: Payer: Self-pay | Admitting: Hematology and Oncology

## 2020-09-28 ENCOUNTER — Other Ambulatory Visit: Payer: Self-pay | Admitting: Hematology and Oncology

## 2020-09-28 ENCOUNTER — Other Ambulatory Visit: Payer: Self-pay | Admitting: *Deleted

## 2020-09-28 MED FILL — TAMOXIFEN 20 MG TABLET: 20 | 90 days supply | Qty: 90 | Fill #0

## 2020-10-01 DIAGNOSIS — Z170 Estrogen receptor positive status [ER+]: Secondary | ICD-10-CM | POA: Diagnosis not present

## 2020-10-01 DIAGNOSIS — N644 Mastodynia: Secondary | ICD-10-CM | POA: Diagnosis not present

## 2020-10-01 DIAGNOSIS — I890 Lymphedema, not elsewhere classified: Secondary | ICD-10-CM | POA: Diagnosis not present

## 2020-10-01 DIAGNOSIS — C50412 Malignant neoplasm of upper-outer quadrant of left female breast: Secondary | ICD-10-CM | POA: Diagnosis not present

## 2020-11-09 DIAGNOSIS — N644 Mastodynia: Secondary | ICD-10-CM | POA: Diagnosis not present

## 2020-11-09 DIAGNOSIS — Z170 Estrogen receptor positive status [ER+]: Secondary | ICD-10-CM | POA: Diagnosis not present

## 2020-11-09 DIAGNOSIS — C50412 Malignant neoplasm of upper-outer quadrant of left female breast: Secondary | ICD-10-CM | POA: Diagnosis not present

## 2020-11-09 DIAGNOSIS — I890 Lymphedema, not elsewhere classified: Secondary | ICD-10-CM | POA: Diagnosis not present

## 2020-12-06 ENCOUNTER — Other Ambulatory Visit (HOSPITAL_COMMUNITY): Payer: Self-pay

## 2020-12-25 ENCOUNTER — Encounter: Payer: Self-pay | Admitting: Nurse Practitioner

## 2020-12-25 ENCOUNTER — Other Ambulatory Visit (HOSPITAL_COMMUNITY): Payer: Self-pay

## 2020-12-25 ENCOUNTER — Ambulatory Visit (INDEPENDENT_AMBULATORY_CARE_PROVIDER_SITE_OTHER): Payer: 59 | Admitting: Nurse Practitioner

## 2020-12-25 ENCOUNTER — Other Ambulatory Visit: Payer: Self-pay

## 2020-12-25 VITALS — BP 118/76 | HR 70 | Resp 16 | Ht 65.5 in | Wt 248.0 lb

## 2020-12-25 DIAGNOSIS — B373 Candidiasis of vulva and vagina: Secondary | ICD-10-CM | POA: Diagnosis not present

## 2020-12-25 DIAGNOSIS — Z01419 Encounter for gynecological examination (general) (routine) without abnormal findings: Secondary | ICD-10-CM

## 2020-12-25 DIAGNOSIS — B3731 Acute candidiasis of vulva and vagina: Secondary | ICD-10-CM

## 2020-12-25 DIAGNOSIS — R350 Frequency of micturition: Secondary | ICD-10-CM | POA: Diagnosis not present

## 2020-12-25 MED ORDER — FLUCONAZOLE 150 MG PO TABS
ORAL_TABLET | ORAL | 0 refills | Status: DC
  Filled 2020-12-25: qty 2, 3d supply, fill #0

## 2020-12-25 NOTE — Patient Instructions (Addendum)
Pap :last done 2019 (co-test), Next pap due 2024  Mammogram: Oncology to follow  Labs: recently done with PCP  Medications: Diflucan 150 mg, #2, take 3 days apart  Colonoscopy 5 years ago (family hx colon cancer), recommend repeat this year (to discuss with GI MD)  Uterine Fibroids  Uterine fibroids, also called leiomyomas, are noncancerous (benign) tumors that can grow in the uterus. They can cause heavy menstrual bleeding and pain. Fibroids may also grow in the fallopian tubes, cervix, or tissues (ligaments) near the uterus. You may have one or many fibroids. Fibroids vary in size, weight, and where they grow in the uterus. Some can become quite large. Most fibroids do not require medical treatment. What are the causes? The cause of this condition is not known. What increases the risk? You are more likely to develop this condition if you:  Are in your 30s or 40s and have not gone through menopause.  Have a family history of this condition.  Are of African American descent.  Started your menstrual period at age 51 or younger.  Have never given birth.  Are overweight or obese. What are the signs or symptoms? Many women do not have any symptoms. Symptoms of this condition may include:  Heavy menstrual bleeding.  Bleeding between menstrual periods.  Pain and pressure in the pelvic area, between your hip bones.  Pain during sex.  Bladder problems, such as needing to urinate right away or more often than usual.  Inability to have children (infertility).  Failure to carry pregnancy to term (miscarriage). How is this diagnosed? This condition may be diagnosed based on:  Your symptoms and medical history.  A physical exam.  A pelvic exam that includes feeling for any tumors.  Imaging tests, such as ultrasound or MRI. How is this treated? Treatment for this condition may include follow-up visits with your health care provider to monitor your fibroids for any changes.  Other treatment may include:  Medicines, such as: ? Medicines to relieve pain, including aspirin and NSAIDs, such as ibuprofen or naproxen. ? Hormone therapy. Treatment may be given as a pill or an injection, or it may be inserted into the uterus using an intrauterine device (IUD).  Surgery that would do one of the following: ? Remove the fibroids (myomectomy). This may be recommended if fibroids affect your fertility and you want to become pregnant. ? Remove the uterus (hysterectomy). ? Block the blood supply to the fibroids (uterine artery embolization). This can cause them to shrink and die. Follow these instructions at home: Medicines  Take over-the-counter and prescription medicines only as told by your health care provider.  Ask your health care provider if you should take iron pills or eat more iron-rich foods, such as dark green, leafy vegetables. Heavy menstrual bleeding can cause low iron levels. Managing pain If directed, apply heat to your back or abdomen to reduce pain. Use the heat source that your health care provider recommends, such as a moist heat pack or a heating pad. To apply heat:  Place a towel between your skin and the heat source.  Leave the heat on for 20-30 minutes.  Remove the heat if your skin turns bright red. This is especially important if you are unable to feel pain, heat, or cold. You may have a greater risk of getting burned.   General instructions  Pay close attention to your menstrual cycle. Tell your health care provider about any changes, such as: ? Heavier bleeding that requires you  to change your pads or tampons more than usual. ? A change in the number of days that your menstrual period lasts. ? A change in symptoms that come with your menstrual period, such as back pain or cramps in your abdomen.  Keep all follow-up visits. This is important, especially if your fibroids need to be monitored for any changes. Contact a health care provider if  you:  Have pelvic pain, back pain, or cramps in your abdomen that do not get better with medicine or heat.  Develop new bleeding between menstrual periods.  Have increased bleeding during or between menstrual periods.  Feel more tired or weak than usual.  Feel light-headed. Get help right away if you:  Faint.  Have pelvic pain that suddenly gets worse.  Have severe vaginal bleeding that soaks a tampon or pad in 30 minutes or less. Summary  Uterine fibroids are noncancerous (benign) tumors that can develop in the uterus.  The exact cause of this condition is not known.  Most fibroids do not require medical treatment unless they affect your ability to have children (fertility).  Contact a health care provider if you have pelvic pain, back pain, or cramps in your abdomen that do not get better with medicines.  Get help right away if you faint, have pelvic pain that suddenly gets worse, or have severe vaginal bleeding. This information is not intended to replace advice given to you by your health care provider. Make sure you discuss any questions you have with your health care provider. Document Revised: 03/20/2020 Document Reviewed: 03/20/2020 Elsevier Patient Education  [place] Maintenance, Female Adopting a healthy lifestyle and getting preventive care are important in promoting health and wellness. Ask your health care provider about:  The right schedule for you to have regular tests and exams.  Things you can do on your own to prevent diseases and keep yourself healthy. What should I know about diet, weight, and exercise? Eat a healthy diet  Eat a diet that includes plenty of vegetables, fruits, low-fat dairy products, and lean protein.  Do not eat a lot of foods that are high in solid fats, added sugars, or sodium.   Maintain a healthy weight Body mass index (BMI) is used to identify weight problems. It estimates body fat based on height and  weight. Your health care provider can help determine your BMI and help you achieve or maintain a healthy weight. Get regular exercise Get regular exercise. This is one of the most important things you can do for your health. Most adults should:  Exercise for at least 150 minutes each week. The exercise should increase your heart rate and make you sweat (moderate-intensity exercise).  Do strengthening exercises at least twice a week. This is in addition to the moderate-intensity exercise.  Spend less time sitting. Even light physical activity can be beneficial. Watch cholesterol and blood lipids Have your blood tested for lipids and cholesterol at 50 years of age, then have this test every 5 years. Have your cholesterol levels checked more often if:  Your lipid or cholesterol levels are high.  You are older than 50 years of age.  You are at high risk for heart disease. What should I know about cancer screening? Depending on your health history and family history, you may need to have cancer screening at various ages. This may include screening for:  Breast cancer.  Cervical cancer.  Colorectal cancer.  Skin cancer.  Lung cancer. What should  I know about heart disease, diabetes, and high blood pressure? Blood pressure and heart disease  High blood pressure causes heart disease and increases the risk of stroke. This is more likely to develop in people who have high blood pressure readings, are of African descent, or are overweight.  Have your blood pressure checked: ? Every 3-5 years if you are 54-57 years of age. ? Every year if you are 67 years old or older. Diabetes Have regular diabetes screenings. This checks your fasting blood sugar level. Have the screening done:  Once every three years after age 15 if you are at a normal weight and have a low risk for diabetes.  More often and at a younger age if you are overweight or have a high risk for diabetes. What should I know  about preventing infection? Hepatitis B If you have a higher risk for hepatitis B, you should be screened for this virus. Talk with your health care provider to find out if you are at risk for hepatitis B infection. Hepatitis C Testing is recommended for:  Everyone born from [birth year] through 1965.  Anyone with known risk factors for hepatitis C. Sexually transmitted infections (STIs)  Get screened for STIs, including gonorrhea and chlamydia, if: ? You are sexually active and are younger than 50 years of age. ? You are older than 50 years of age and your health care provider tells you that you are at risk for this type of infection. ? Your sexual activity has changed since you were last screened, and you are at increased risk for chlamydia or gonorrhea. Ask your health care provider if you are at risk.  Ask your health care provider about whether you are at high risk for HIV. Your health care provider may recommend a prescription medicine to help prevent HIV infection. If you choose to take medicine to prevent HIV, you should first get tested for HIV. You should then be tested every 3 months for as long as you are taking the medicine. Pregnancy  If you are about to stop having your period (premenopausal) and you may become pregnant, seek counseling before you get pregnant.  Take 400 to 800 micrograms (mcg) of folic acid every day if you become pregnant.  Ask for birth control (contraception) if you want to prevent pregnancy. Osteoporosis and menopause Osteoporosis is a disease in which the bones lose minerals and strength with aging. This can result in bone fractures. If you are 62 years old or older, or if you are at risk for osteoporosis and fractures, ask your health care provider if you should:  Be screened for bone loss.  Take a calcium or vitamin D supplement to lower your risk of fractures.  Be given hormone replacement therapy (HRT) to treat symptoms of menopause. Follow these  instructions at home: Lifestyle  Do not use any products that contain nicotine or tobacco, such as cigarettes, e-cigarettes, and chewing tobacco. If you need help quitting, ask your health care provider.  Do not use street drugs.  Do not share needles.  Ask your health care provider for help if you need support or information about quitting drugs. Alcohol use  Do not drink alcohol if: ? Your health care provider tells you not to drink. ? You are pregnant, may be pregnant, or are planning to become pregnant.  If you drink alcohol: ? Limit how much you use to 0-1 drink a day. ? Limit intake if you are breastfeeding.  Be aware of how  much alcohol is in your drink. In the [place] one drink equals one 12 oz bottle of beer (355 mL), one 5 oz glass of wine (148 mL), or one 1 oz glass of hard liquor (44 mL). General instructions  Schedule regular health, dental, and eye exams.  Stay current with your vaccines.  Tell your health care provider if: ? You often feel depressed. ? You have ever been abused or do not feel safe at home. Summary  Adopting a healthy lifestyle and getting preventive care are important in promoting health and wellness.  Follow your health care provider's instructions about healthy diet, exercising, and getting tested or screened for diseases.  Follow your health care provider's instructions on monitoring your cholesterol and blood pressure. This information is not intended to replace advice given to you by your health care provider. Make sure you discuss any questions you have with your health care provider. Document Revised: 08/11/2018 Document Reviewed: 08/11/2018 Elsevier Patient Education  2021 [place].  Vaginal Yeast Infection, Adult  Vaginal yeast infection is a condition that causes vaginal discharge as well as soreness, swelling, and redness (inflammation) of the vagina. This is a common condition. Some women get this infection frequently. What  are the causes? This condition is caused by a change in the normal balance of the yeast (candida) and bacteria that live in the vagina. This change causes an overgrowth of yeast, which causes the inflammation. What increases the risk? The condition is more likely to develop in women who:  Take antibiotic medicines.  Have diabetes.  Take birth control pills.  Are pregnant.  Douche often.  Have a weak body defense system (immune system).  Have been taking steroid medicines for a long time.  Frequently wear tight clothing. What are the signs or symptoms? Symptoms of this condition include:  White, thick, creamy vaginal discharge.  Swelling, itching, redness, and irritation of the vagina. The lips of the vagina (vulva) may be affected as well.  Pain or a burning feeling while urinating.  Pain during sex. How is this diagnosed? This condition is diagnosed based on:  Your medical history.  A physical exam.  A pelvic exam. Your health care provider will examine a sample of your vaginal discharge under a microscope. Your health care provider may send this sample for testing to confirm the diagnosis. How is this treated? This condition is treated with medicine. Medicines may be over-the-counter or prescription. You may be told to use one or more of the following:  Medicine that is taken by mouth (orally).  Medicine that is applied as a cream (topically).  Medicine that is inserted directly into the vagina (suppository). Follow these instructions at home: Lifestyle  Do not have sex until your health care provider approves. Tell your sex partner that you have a yeast infection. That person should go to his or her health care provider and ask if they should also be treated.  Do not wear tight clothes, such as pantyhose or tight pants.  Wear breathable cotton underwear. General instructions  Take or apply over-the-counter and prescription medicines only as told by your  health care provider.  Eat more yogurt. This may help to keep your yeast infection from returning.  Do not use tampons until your health care provider approves.  Try taking a sitz bath to help with discomfort. This is a warm water bath that is taken while you are sitting down. The water should only come up to your hips and should cover  your buttocks. Do this 3-4 times per day or as told by your health care provider.  Do not douche.  If you have diabetes, keep your blood sugar levels under control.  Keep all follow-up visits as told by your health care provider. This is important.   Contact a health care provider if:  You have a fever.  Your symptoms go away and then return.  Your symptoms do not get better with treatment.  Your symptoms get worse.  You have new symptoms.  You develop blisters in or around your vagina.  You have blood coming from your vagina and it is not your menstrual period.  You develop pain in your abdomen. Summary  Vaginal yeast infection is a condition that causes discharge as well as soreness, swelling, and redness (inflammation) of the vagina.  This condition is treated with medicine. Medicines may be over-the-counter or prescription.  Take or apply over-the-counter and prescription medicines only as told by your health care provider.  Do not douche. Do not have sex or use tampons until your health care provider approves.  Contact a health care provider if your symptoms do not get better with treatment or your symptoms go away and then return. This information is not intended to replace advice given to you by your health care provider. Make sure you discuss any questions you have with your health care provider. Document Revised: 03/18/2019 Document Reviewed: 01/04/2018 Elsevier Patient Education  [place].

## 2020-12-25 NOTE — Progress Notes (Signed)
50 y.o. G32P0011 Married Erin Gallagher female here for annual exam.    Getting a period about 2-3 months, lasting 4 days, very light bleeding but some clots, denies bleeding in between periods Denies pain with menses, only light cramps. LMP 09/21/2020  Denies hot flashes, denies vaginal dryness or pain with sex  Period Pattern: (!) Irregular Menstrual Flow: Light Menstrual Control: Maxi pad Dysmenorrhea: (!) Mild No LMP recorded. (Menstrual status: Irregular Periods).          Dx breast cancer September 2020. Left breast, stage 1. Had lumpectomy and radiation. On tamoxifen x 1 year, tolerating well.  Father had colon cancer age 67, deceased  Works as a Teaching laboratory technician at [employer], Good relationship with husband, supported   Sexually active: Yes.    The current method of family planning is tubal ligation.    Exercising: Yes.    walking Smoker:  no  Health Maintenance: Pap:  12-03-2017 neg HPV HR neg History of abnormal Pap:  no MMG:  06-15-2020 catgeory b density birads 2:neg Colonoscopy:  2017 BMD:   none Gardasil:   n/a Covid-19: moderna Hep C testing: neg 2019 Screening Labs: with PCP   reports that she has never smoked. She has never used smokeless tobacco. She reports current alcohol use of about 2.0 standard drinks of alcohol per week. She reports that she does not use drugs.  Past Medical History:  Diagnosis Date  . Arthritis   . Blood transfusion without reported diagnosis   . Cancer Procedure [facility])    left breast cancer  . Family history of breast cancer   . Family history of colon cancer   . Family history of lung cancer   . Family history of throat cancer   . Gall bladder disease    gall bladder removal  . Hypertension   . Personal history of radiation therapy     Past Surgical History:  Procedure Laterality Date  . BREAST BIOPSY Left 05/16/2019  . BREAST LUMPECTOMY Left 06/14/2019  . BREAST LUMPECTOMY WITH RADIOACTIVE SEED AND SENTINEL LYMPH NODE BIOPSY  Left 06/14/2019   Procedure: LEFT BREAST LUMPECTOMY WITH RADIOACTIVE SEED AND SENTINEL LYMPH NODE BIOPSY;  Surgeon: [name], MD;  Location: [place];  Service: General;  Laterality: Left;  . CESAREAN SECTION    . CHOLECYSTECTOMY    . TUBAL LIGATION      Current Outpatient Medications  Medication Sig Dispense Refill  . amLODipine (NORVASC) 10 MG tablet Take 1 tablet (10 mg total) by mouth daily. 90 tablet 1  . Ascorbic Acid (VITAMIN C PO) Take by mouth.    . cyclobenzaprine (FLEXERIL) 10 MG tablet Take 0.5-1 tablets (5-10 mg total) by mouth 3 (three) times daily as needed for muscle spasms. 60 tablet 0  . losartan-hydrochlorothiazide (HYZAAR) 50-12.5 MG tablet TAKE 1 TABLET BY MOUTH DAILY. 90 tablet 1  . Multiple Vitamin (MULTIVITAMIN PO) Take by mouth.    . naproxen (NAPROSYN) 500 MG tablet TAKE 1 TABLET BY MOUTH TWICE DAILY WITH A MEAL 60 tablet 1  . tamoxifen (NOLVADEX) 20 MG tablet TAKE 1 TABLET BY MOUTH ONCE DAILY 90 tablet 3  . VITAMIN D PO Take by mouth.     No current facility-administered medications for this visit.    Family History  Problem Relation Age of Onset  . Hypertension Mother   . Hypertension Father   . Colon cancer Father        dx 90s  . Stroke Maternal Grandmother   .  Hypertension Maternal Grandmother   . Aneurysm Maternal Grandmother   . Throat cancer Maternal Uncle   . Lung cancer Paternal Uncle     Review of Systems  Constitutional: Negative.   HENT: Negative.   Eyes: Negative.   Respiratory: Negative.   Cardiovascular: Negative.   Gastrointestinal: Negative.   Endocrine: Negative.   Genitourinary: Positive for frequency.  Musculoskeletal: Negative.   Skin: Negative.   Allergic/Immunologic: Negative.   Neurological: Negative.   Hematological: Negative.   Psychiatric/Behavioral: Negative.     Exam:   BP 118/76   Pulse 70   Resp 16   Ht 5' 5.5" (1.664 m)   Wt 248 lb (112.5 kg)   BMI 40.64 kg/m   Height: 5' 5.5"  (166.4 cm)  General appearance: alert, cooperative and appears stated age, no acute distress Head: Normocephalic, without obvious abnormality Neck: no adenopathy, thyroid normal to inspection and palpation Lungs: clear to auscultation bilaterally Breasts: Normal to palpation without dominant masses, lumpectomy scar left breast 3 oclock at areolar border Heart: regular rate and rhythm Abdomen: soft, non-tender; no masses,  no organomegaly Extremities: extremities normal, no edema Skin: No rashes or lesions Lymph nodes: Cervical, supraclavicular, and axillary nodes normal. No abnormal inguinal nodes palpated Neurologic: Grossly normal   Pelvic: External genitalia:  Edema and redness              Urethra:  normal appearing urethra with no masses, tenderness or lesions              Bartholins and Skenes: normal                 Vagina: normal appearing vagina, appropriate for age, white clumpy discharge (yeast previously confirmed in urinalysis)              Cervix: neg cervical motion tenderness, no visible lesions             Bimanual Exam:   Uterus:  enlarged, 12 weeks size and irregular              Adnexa: no mass, fullness, tenderness              Rectal: no palpable mass   Joy, CMA Chaperone was present for exam.  A:  Well Woman with normal exam  ER+ breast cancer, on Tamoxifen  Fibroid uterus (stable when compared to [place] report 06/2020)  Perimenopausal, light periods q 3 months  Yeast vaginitis  P:   Pap :last done 2019 (co-test), Next pap due 2024  Mammogram: Oncology to follow  Labs: recently done with PCP  Medications: Diflucan 150 mg, #2, take 3 days apart  Colonoscopy 5 years ago (family hx colon cancer), recommend repeat this year (to discuss with GI MD)  Advised to contact clinic if heavy or bleeding in between periods  F/u 1 year

## 2020-12-26 ENCOUNTER — Other Ambulatory Visit (HOSPITAL_COMMUNITY): Payer: Self-pay

## 2020-12-26 LAB — URINALYSIS, COMPLETE W/RFL CULTURE
Bilirubin Urine: NEGATIVE
Glucose, UA: NEGATIVE
Hyaline Cast: NONE SEEN /LPF
Ketones, ur: NEGATIVE
Nitrites, Initial: NEGATIVE
Protein, ur: NEGATIVE
Specific Gravity, Urine: 1.02 (ref 1.001–1.035)
pH: 5 (ref 5.0–8.0)

## 2020-12-26 LAB — URINE CULTURE
MICRO NUMBER:: 11815083
Result:: NO GROWTH
SPECIMEN QUALITY:: ADEQUATE

## 2020-12-26 LAB — CULTURE INDICATED

## 2020-12-27 MED FILL — Losartan Potassium & Hydrochlorothiazide Tab 50-12.5 MG: ORAL | 90 days supply | Qty: 90 | Fill #0 | Status: AC

## 2020-12-28 ENCOUNTER — Other Ambulatory Visit (HOSPITAL_COMMUNITY): Payer: Self-pay

## 2021-01-01 ENCOUNTER — Other Ambulatory Visit (HOSPITAL_COMMUNITY): Payer: Self-pay

## 2021-01-01 ENCOUNTER — Other Ambulatory Visit: Payer: Self-pay | Admitting: Family Medicine

## 2021-01-02 ENCOUNTER — Other Ambulatory Visit (HOSPITAL_COMMUNITY): Payer: Self-pay

## 2021-01-02 ENCOUNTER — Other Ambulatory Visit: Payer: Self-pay | Admitting: Internal Medicine

## 2021-01-03 ENCOUNTER — Other Ambulatory Visit (HOSPITAL_COMMUNITY): Payer: Self-pay

## 2021-01-03 NOTE — Telephone Encounter (Signed)
I should not be asked about this. We cannot prescribe any medications for people who are not our patients.

## 2021-01-04 ENCOUNTER — Encounter: Payer: Self-pay | Admitting: Nurse Practitioner

## 2021-01-04 ENCOUNTER — Other Ambulatory Visit (HOSPITAL_COMMUNITY): Payer: Self-pay

## 2021-01-04 ENCOUNTER — Encounter: Payer: Self-pay | Admitting: Emergency Medicine

## 2021-01-04 MED ORDER — AMLODIPINE BESYLATE 10 MG PO TABS
10.0000 mg | ORAL_TABLET | Freq: Every day | ORAL | 0 refills | Status: DC
  Filled 2021-01-04: qty 90, 90d supply, fill #0

## 2021-01-04 NOTE — Addendum Note (Signed)
Addended by: Donivan Scull on: 01/04/2021 03:26 PM   Modules accepted: Orders

## 2021-01-11 ENCOUNTER — Other Ambulatory Visit (HOSPITAL_COMMUNITY): Payer: Self-pay

## 2021-01-11 ENCOUNTER — Other Ambulatory Visit: Payer: Self-pay

## 2021-01-11 MED ORDER — LOSARTAN POTASSIUM-HCTZ 50-12.5 MG PO TABS
1.0000 | ORAL_TABLET | Freq: Every day | ORAL | 0 refills | Status: DC
  Filled 2021-01-11 – 2021-05-21 (×2): qty 30, 30d supply, fill #0

## 2021-01-11 MED ORDER — AMLODIPINE BESYLATE 10 MG PO TABS
10.0000 mg | ORAL_TABLET | Freq: Every day | ORAL | 0 refills | Status: DC
  Filled 2021-01-11 – 2021-05-03 (×2): qty 30, 30d supply, fill #0

## 2021-01-11 NOTE — Telephone Encounter (Signed)
Refilled medication

## 2021-02-05 MED FILL — Tamoxifen Citrate Tab 20 MG (Base Equivalent): ORAL | 90 days supply | Qty: 90 | Fill #0 | Status: AC

## 2021-02-06 ENCOUNTER — Other Ambulatory Visit: Payer: Self-pay

## 2021-02-06 ENCOUNTER — Other Ambulatory Visit (HOSPITAL_COMMUNITY): Payer: Self-pay

## 2021-02-06 ENCOUNTER — Encounter: Payer: Self-pay | Admitting: Emergency Medicine

## 2021-02-06 ENCOUNTER — Ambulatory Visit: Payer: 59 | Admitting: Emergency Medicine

## 2021-02-06 VITALS — BP 120/88 | HR 94 | Temp 98.3°F | Ht 65.5 in | Wt 251.6 lb

## 2021-02-06 DIAGNOSIS — Z7689 Persons encountering health services in other specified circumstances: Secondary | ICD-10-CM

## 2021-02-06 DIAGNOSIS — Z6841 Body Mass Index (BMI) 40.0 and over, adult: Secondary | ICD-10-CM | POA: Diagnosis not present

## 2021-02-06 DIAGNOSIS — I10 Essential (primary) hypertension: Secondary | ICD-10-CM

## 2021-02-06 DIAGNOSIS — Z23 Encounter for immunization: Secondary | ICD-10-CM

## 2021-02-06 DIAGNOSIS — C50412 Malignant neoplasm of upper-outer quadrant of left female breast: Secondary | ICD-10-CM | POA: Diagnosis not present

## 2021-02-06 DIAGNOSIS — Z853 Personal history of malignant neoplasm of breast: Secondary | ICD-10-CM | POA: Diagnosis not present

## 2021-02-06 DIAGNOSIS — Z170 Estrogen receptor positive status [ER+]: Secondary | ICD-10-CM

## 2021-02-06 NOTE — Assessment & Plan Note (Signed)
Diet and nutrition discussed.  Referred to medical weight management clinic. Advised to lower amount of daily carbohydrate intake.

## 2021-02-06 NOTE — Progress Notes (Signed)
Erin Gallagher 50 y.o.   Chief Complaint  Patient presents with  . New Patient (Initial Visit)    Former Dr Pamella Pert    HISTORY OF PRESENT ILLNESS: This is a 50 y.o. female former patient of Dr. Pamella Pert here to establish care with me. Works for [employer]. Has history of hypertension on amlodipine 10 mg daily and losartan-HCTZ 50-12.5 mg daily History of breast cancer on tamoxifen 20 mg once daily  Non-smoker. No complaints or medical concerns today.  HPI   Prior to Admission medications   Medication Sig Start Date End Date Taking? Authorizing Provider  amLODipine (NORVASC) 10 MG tablet Take 1 tablet (10 mg total) by mouth daily. 01/11/21  Yes Jeoffrey Eleazer, Ines Bloomer, MD  Ascorbic Acid (VITAMIN C PO) Take by mouth.   Yes [provider]  cyclobenzaprine (FLEXERIL) 10 MG tablet Take 0.5-1 tablets (5-10 mg total) by mouth 3 (three) times daily as needed for muscle spasms. 04/26/20  Yes Jacelyn Pi, Lilia Argue, MD  losartan-hydrochlorothiazide (HYZAAR) 50-12.5 MG tablet TAKE 1 TABLET BY MOUTH DAILY. 01/11/21 01/11/22 Yes Saige Canton, Ines Bloomer, MD  Multiple Vitamin (MULTIVITAMIN PO) Take by mouth.   Yes [provider]  naproxen (NAPROSYN) 500 MG tablet TAKE 1 TABLET BY MOUTH TWICE DAILY WITH A MEAL 02/15/20 02/14/21 Yes Jacelyn Pi, Lilia Argue, MD  tamoxifen (NOLVADEX) 20 MG tablet TAKE 1 TABLET BY MOUTH ONCE DAILY 09/28/20 09/28/21 Yes Nicholas Lose, MD  VITAMIN D PO Take by mouth.   Yes [provider]  fluconazole (DIFLUCAN) 150 MG tablet Take 1 tablet by mouth today and the second pill in 3 days Patient not taking: Reported on 02/06/2021 12/25/20   Karma Ganja, NP    Allergies  Allergen Reactions  . Other Rash    Steri Strips cause itching and a rash    Patient Active Problem List   Diagnosis Date Noted  . Malignant neoplasm of upper-outer quadrant of left breast in female, estrogen receptor positive ([place]) 05/20/2019  . Arthritis 11/23/2017  . HTN  (hypertension) 06/22/2013    Past Medical History:  Diagnosis Date  . Arthritis   . Blood transfusion without reported diagnosis   . Cancer [facility])    left breast cancer  . Family history of breast cancer   . Family history of colon cancer   . Family history of lung cancer   . Family history of throat cancer   . Gall bladder disease    gall bladder removal  . Hypertension   . Personal history of radiation therapy     Past Surgical History:  Procedure Laterality Date  . BREAST BIOPSY Left 05/16/2019  . BREAST LUMPECTOMY Left 06/14/2019  . BREAST LUMPECTOMY WITH RADIOACTIVE SEED AND SENTINEL LYMPH NODE BIOPSY Left 06/14/2019   Procedure: LEFT BREAST LUMPECTOMY WITH RADIOACTIVE SEED AND SENTINEL LYMPH NODE BIOPSY;  Surgeon: [name], MD;  Location: [facility];  Service: General;  Laterality: Left;  . CESAREAN SECTION    . CHOLECYSTECTOMY    . TUBAL LIGATION      Social History   Socioeconomic History  . Marital status: Married    Spouse name: Not on file  . Number of children: 1  . Years of education: Not on file  . Highest education level: Not on file  Occupational History  . Occupation: Librarian, academic  Tobacco Use  . Smoking status: Never Smoker  . Smokeless tobacco: Never Used  Vaping Use  . Vaping Use: Never used  Substance  and Sexual Activity  . Alcohol use: Yes    Alcohol/week: 2.0 standard drinks    Types: 2 Standard drinks or equivalent per week    Comment: occasionally  . Drug use: No  . Sexual activity: Yes    Birth control/protection: Surgical    Comment: Tubal ligation  Other Topics Concern  . Not on file  Social History Narrative  . Not on file   Social Determinants of Health   Financial Resource Strain: Not on file  Food Insecurity: Not on file  Transportation Needs: Not on file  Physical Activity: Not on file  Stress: Not on file  Social Connections: Not on file  Intimate Partner Violence: Not on file    Family History   Problem Relation Age of Onset  . Hypertension Mother   . Hypertension Father   . Colon cancer Father        dx 58s  . Stroke Maternal Grandmother   . Hypertension Maternal Grandmother   . Aneurysm Maternal Grandmother   . Throat cancer Maternal Uncle   . Lung cancer Paternal Uncle      Review of Systems  Constitutional: Negative.  Negative for chills and fever.  HENT: Negative.  Negative for congestion and sore throat.   Respiratory: Negative.  Negative for cough and shortness of breath.   Cardiovascular: Negative.  Negative for chest pain and palpitations.  Gastrointestinal: Negative for abdominal pain, diarrhea, nausea and vomiting.  Genitourinary: Negative.  Negative for dysuria and hematuria.  Musculoskeletal: Negative.   Skin: Negative.  Negative for rash.  Neurological: Negative.  Negative for dizziness and headaches.  All other systems reviewed and are negative.   Today's Vitals   02/06/21 1105  BP: 120/88  Pulse: 94  Temp: 98.3 F (36.8 C)  TempSrc: Oral  SpO2: 98%  Weight: 251 lb 9.6 oz (114.1 kg)  Height: 5' 5.5" (1.664 m)   Body mass index is 41.23 kg/m. Wt Readings from Last 3 Encounters:  02/06/21 251 lb 9.6 oz (114.1 kg)  12/25/20 248 lb (112.5 kg)  06/01/20 252 lb 12.8 oz (114.7 kg)    Physical Exam Vitals reviewed.  Constitutional:      Appearance: Normal appearance. She is obese.  HENT:     Head: Normocephalic.  Eyes:     Extraocular Movements: Extraocular movements intact.     Conjunctiva/sclera: Conjunctivae normal.     Pupils: Pupils are equal, round, and reactive to light.  Cardiovascular:     Rate and Rhythm: Normal rate and regular rhythm.     Pulses: Normal pulses.     Heart sounds: Normal heart sounds.  Pulmonary:     Effort: Pulmonary effort is normal.     Breath sounds: Normal breath sounds.  Musculoskeletal:        General: Normal range of motion.     Cervical back: Normal range of motion and neck supple.  Skin:     General: Skin is warm and dry.     Capillary Refill: Capillary refill takes less than 2 seconds.  Neurological:     General: No focal deficit present.     Mental Status: She is alert and oriented to person, place, and time.  Psychiatric:        Mood and Affect: Mood normal.        Behavior: Behavior normal.      ASSESSMENT & PLAN: A total of 30 minutes was spent with the patient and counseling/coordination of care regarding establishing care with me,  hypertension and cardiovascular risks associated with this condition, review of all medications, review of past medical history including breast cancer history, review of most recent blood work results, education on nutrition and referral to medical weight management clinic, health maintenance items including need for pneumonia vaccine, prognosis, documentation and need for follow-up.  Essential hypertension Well-controlled hypertension.  Continue amlodipine 10 mg daily and Hyzaar 50-12.5 mg daily. Dietary approaches to stop hypertension discussed with patient.  Morbid obesity ([place]) Diet and nutrition discussed.  Referred to medical weight management clinic. Advised to lower amount of daily carbohydrate intake.  Malignant neoplasm of upper-outer quadrant of left breast in female, estrogen receptor positive ([place]) Stable.  On tamoxifen 20 mg daily. Has regular visits with oncologist and yearly mammograms  Scotlyn was seen today for new patient (initial visit).  Diagnoses and all orders for this visit:  Essential hypertension  Encounter to establish care  History of breast cancer  BMI 40.0-44.9, adult ([place]) -     Amb Ref to Medical Weight Management  Malignant neoplasm of upper-outer quadrant of left breast in female, estrogen receptor positive ([place])  Morbid obesity ([place]) -     Amb Ref to Medical Weight Management  Need for vaccination against Streptococcus pneumoniae using pneumococcal conjugate vaccine 13 -     Pneumococcal  conjugate vaccine 13-valent IM    Patient Instructions   Health Maintenance, Female Adopting a healthy lifestyle and getting preventive care are important in promoting health and wellness. Ask your health care provider about:  The right schedule for you to have regular tests and exams.  Things you can do on your own to prevent diseases and keep yourself healthy. What should I know about diet, weight, and exercise? Eat a healthy diet  Eat a diet that includes plenty of vegetables, fruits, low-fat dairy products, and lean protein.  Do not eat a lot of foods that are high in solid fats, added sugars, or sodium.   Maintain a healthy weight Body mass index (BMI) is used to identify weight problems. It estimates body fat based on height and weight. Your health care provider can help determine your BMI and help you achieve or maintain a healthy weight. Get regular exercise Get regular exercise. This is one of the most important things you can do for your health. Most adults should:  Exercise for at least 150 minutes each week. The exercise should increase your heart rate and make you sweat (moderate-intensity exercise).  Do strengthening exercises at least twice a week. This is in addition to the moderate-intensity exercise.  Spend less time sitting. Even light physical activity can be beneficial. Watch cholesterol and blood lipids Have your blood tested for lipids and cholesterol at 50 years of age, then have this test every 5 years. Have your cholesterol levels checked more often if:  Your lipid or cholesterol levels are high.  You are older than 50 years of age.  You are at high risk for heart disease. What should I know about cancer screening? Depending on your health history and family history, you may need to have cancer screening at various ages. This may include screening for:  Breast cancer.  Cervical cancer.  Colorectal cancer.  Skin cancer.  Lung cancer. What  should I know about heart disease, diabetes, and high blood pressure? Blood pressure and heart disease  High blood pressure causes heart disease and increases the risk of stroke. This is more likely to develop in people who have high blood pressure readings,  are of African descent, or are overweight.  Have your blood pressure checked: ? Every 3-5 years if you are 50-32 years of age. ? Every year if you are 4 years old or older. Diabetes Have regular diabetes screenings. This checks your fasting blood sugar level. Have the screening done:  Once every three years after age 56 if you are at a normal weight and have a low risk for diabetes.  More often and at a younger age if you are overweight or have a high risk for diabetes. What should I know about preventing infection? Hepatitis B If you have a higher risk for hepatitis B, you should be screened for this virus. Talk with your health care provider to find out if you are at risk for hepatitis B infection. Hepatitis C Testing is recommended for:  Everyone born from [birth year] through 1965.  Anyone with known risk factors for hepatitis C. Sexually transmitted infections (STIs)  Get screened for STIs, including gonorrhea and chlamydia, if: ? You are sexually active and are younger than 50 years of age. ? You are older than 50 years of age and your health care provider tells you that you are at risk for this type of infection. ? Your sexual activity has changed since you were last screened, and you are at increased risk for chlamydia or gonorrhea. Ask your health care provider if you are at risk.  Ask your health care provider about whether you are at high risk for HIV. Your health care provider may recommend a prescription medicine to help prevent HIV infection. If you choose to take medicine to prevent HIV, you should first get tested for HIV. You should then be tested every 3 months for as long as you are taking the medicine. Pregnancy  If  you are about to stop having your period (premenopausal) and you may become pregnant, seek counseling before you get pregnant.  Take 400 to 800 micrograms (mcg) of folic acid every day if you become pregnant.  Ask for birth control (contraception) if you want to prevent pregnancy. Osteoporosis and menopause Osteoporosis is a disease in which the bones lose minerals and strength with aging. This can result in bone fractures. If you are 78 years old or older, or if you are at risk for osteoporosis and fractures, ask your health care provider if you should:  Be screened for bone loss.  Take a calcium or vitamin D supplement to lower your risk of fractures.  Be given hormone replacement therapy (HRT) to treat symptoms of menopause. Follow these instructions at home: Lifestyle  Do not use any products that contain nicotine or tobacco, such as cigarettes, e-cigarettes, and chewing tobacco. If you need help quitting, ask your health care provider.  Do not use street drugs.  Do not share needles.  Ask your health care provider for help if you need support or information about quitting drugs. Alcohol use  Do not drink alcohol if: ? Your health care provider tells you not to drink. ? You are pregnant, may be pregnant, or are planning to become pregnant.  If you drink alcohol: ? Limit how much you use to 0-1 drink a day. ? Limit intake if you are breastfeeding.  Be aware of how much alcohol is in your drink. In the [place] one drink equals one 12 oz bottle of beer (355 mL), one 5 oz glass of wine (148 mL), or one 1 oz glass of hard liquor (44 mL). General instructions  Schedule regular  health, dental, and eye exams.  Stay current with your vaccines.  Tell your health care provider if: ? You often feel depressed. ? You have ever been abused or do not feel safe at home. Summary  Adopting a healthy lifestyle and getting preventive care are important in promoting health and  wellness.  Follow your health care provider's instructions about healthy diet, exercising, and getting tested or screened for diseases.  Follow your health care provider's instructions on monitoring your cholesterol and blood pressure. This information is not intended to replace advice given to you by your health care provider. Make sure you discuss any questions you have with your health care provider. Document Revised: 08/11/2018 Document Reviewed: 08/11/2018 Elsevier Patient Education  2021 [place], MD [facility]

## 2021-02-06 NOTE — Assessment & Plan Note (Signed)
Stable.  On tamoxifen 20 mg daily. Has regular visits with oncologist and yearly mammograms

## 2021-02-06 NOTE — Patient Instructions (Signed)
Health Maintenance, Female Adopting a healthy lifestyle and getting preventive care are important in promoting health and wellness. Ask your health care provider about:  The right schedule for you to have regular tests and exams.  Things you can do on your own to prevent diseases and keep yourself healthy. What should I know about diet, weight, and exercise? Eat a healthy diet  Eat a diet that includes plenty of vegetables, fruits, low-fat dairy products, and lean protein.  Do not eat a lot of foods that are high in solid fats, added sugars, or sodium.   Maintain a healthy weight Body mass index (BMI) is used to identify weight problems. It estimates body fat based on height and weight. Your health care provider can help determine your BMI and help you achieve or maintain a healthy weight. Get regular exercise Get regular exercise. This is one of the most important things you can do for your health. Most adults should:  Exercise for at least 150 minutes each week. The exercise should increase your heart rate and make you sweat (moderate-intensity exercise).  Do strengthening exercises at least twice a week. This is in addition to the moderate-intensity exercise.  Spend less time sitting. Even light physical activity can be beneficial. Watch cholesterol and blood lipids Have your blood tested for lipids and cholesterol at 50 years of age, then have this test every 5 years. Have your cholesterol levels checked more often if:  Your lipid or cholesterol levels are high.  You are older than 50 years of age.  You are at high risk for heart disease. What should I know about cancer screening? Depending on your health history and family history, you may need to have cancer screening at various ages. This may include screening for:  Breast cancer.  Cervical cancer.  Colorectal cancer.  Skin cancer.  Lung cancer. What should I know about heart disease, diabetes, and high blood  pressure? Blood pressure and heart disease  High blood pressure causes heart disease and increases the risk of stroke. This is more likely to develop in people who have high blood pressure readings, are of African descent, or are overweight.  Have your blood pressure checked: ? Every 3-5 years if you are 18-39 years of age. ? Every year if you are 40 years old or older. Diabetes Have regular diabetes screenings. This checks your fasting blood sugar level. Have the screening done:  Once every three years after age 40 if you are at a normal weight and have a low risk for diabetes.  More often and at a younger age if you are overweight or have a high risk for diabetes. What should I know about preventing infection? Hepatitis B If you have a higher risk for hepatitis B, you should be screened for this virus. Talk with your health care provider to find out if you are at risk for hepatitis B infection. Hepatitis C Testing is recommended for:  Everyone born from 1945 through 1965.  Anyone with known risk factors for hepatitis C. Sexually transmitted infections (STIs)  Get screened for STIs, including gonorrhea and chlamydia, if: ? You are sexually active and are younger than 50 years of age. ? You are older than 50 years of age and your health care provider tells you that you are at risk for this type of infection. ? Your sexual activity has changed since you were last screened, and you are at increased risk for chlamydia or gonorrhea. Ask your health care provider   if you are at risk.  Ask your health care provider about whether you are at high risk for HIV. Your health care provider may recommend a prescription medicine to help prevent HIV infection. If you choose to take medicine to prevent HIV, you should first get tested for HIV. You should then be tested every 3 months for as long as you are taking the medicine. Pregnancy  If you are about to stop having your period (premenopausal) and  you may become pregnant, seek counseling before you get pregnant.  Take 400 to 800 micrograms (mcg) of folic acid every day if you become pregnant.  Ask for birth control (contraception) if you want to prevent pregnancy. Osteoporosis and menopause Osteoporosis is a disease in which the bones lose minerals and strength with aging. This can result in bone fractures. If you are 65 years old or older, or if you are at risk for osteoporosis and fractures, ask your health care provider if you should:  Be screened for bone loss.  Take a calcium or vitamin D supplement to lower your risk of fractures.  Be given hormone replacement therapy (HRT) to treat symptoms of menopause. Follow these instructions at home: Lifestyle  Do not use any products that contain nicotine or tobacco, such as cigarettes, e-cigarettes, and chewing tobacco. If you need help quitting, ask your health care provider.  Do not use street drugs.  Do not share needles.  Ask your health care provider for help if you need support or information about quitting drugs. Alcohol use  Do not drink alcohol if: ? Your health care provider tells you not to drink. ? You are pregnant, may be pregnant, or are planning to become pregnant.  If you drink alcohol: ? Limit how much you use to 0-1 drink a day. ? Limit intake if you are breastfeeding.  Be aware of how much alcohol is in your drink. In the [place] one drink equals one 12 oz bottle of beer (355 mL), one 5 oz glass of wine (148 mL), or one 1 oz glass of hard liquor (44 mL). General instructions  Schedule regular health, dental, and eye exams.  Stay current with your vaccines.  Tell your health care provider if: ? You often feel depressed. ? You have ever been abused or do not feel safe at home. Summary  Adopting a healthy lifestyle and getting preventive care are important in promoting health and wellness.  Follow your health care provider's instructions about healthy  diet, exercising, and getting tested or screened for diseases.  Follow your health care provider's instructions on monitoring your cholesterol and blood pressure. This information is not intended to replace advice given to you by your health care provider. Make sure you discuss any questions you have with your health care provider. Document Revised: 08/11/2018 Document Reviewed: 08/11/2018 Elsevier Patient Education  2021 [employer]vier Inc[employer]  

## 2021-02-06 NOTE — Assessment & Plan Note (Signed)
Well-controlled hypertension.  Continue amlodipine 10 mg daily and Hyzaar 50-12.5 mg daily. Dietary approaches to stop hypertension discussed with patient.

## 2021-02-11 ENCOUNTER — Encounter: Payer: Self-pay | Admitting: *Deleted

## 2021-04-18 DIAGNOSIS — H5213 Myopia, bilateral: Secondary | ICD-10-CM | POA: Diagnosis not present

## 2021-04-18 DIAGNOSIS — H524 Presbyopia: Secondary | ICD-10-CM | POA: Diagnosis not present

## 2021-04-18 DIAGNOSIS — H52203 Unspecified astigmatism, bilateral: Secondary | ICD-10-CM | POA: Diagnosis not present

## 2021-04-19 ENCOUNTER — Ambulatory Visit: Payer: 59 | Admitting: Internal Medicine

## 2021-05-03 ENCOUNTER — Other Ambulatory Visit (HOSPITAL_COMMUNITY): Payer: Self-pay

## 2021-05-22 ENCOUNTER — Other Ambulatory Visit (HOSPITAL_COMMUNITY): Payer: Self-pay

## 2021-05-29 NOTE — Progress Notes (Signed)
Patient Care Team: Horald Pollen, MD as PCP - General (Internal Medicine) [name], MD as Consulting Physician (General Surgery) Nicholas Lose, MD as Consulting Physician (Hematology and Oncology) Kyung Rudd, MD as Consulting Physician (Radiation Oncology)  DIAGNOSIS:    ICD-10-CM   1. Malignant neoplasm of upper-outer quadrant of left breast in female, estrogen receptor positive ([place])  C50.412    Z17.0       SUMMARY OF ONCOLOGIC HISTORY: Oncology History  Malignant neoplasm of upper-outer quadrant of left breast in female, estrogen receptor positive ([place])  05/20/2019 Initial Diagnosis   Routine screening mammogram detected a 1.4cm left breast mass at the 2:30 position, no axillary adenopathy. 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    06/05/2019 Genetic Testing   Negative genetic testing. No pathogenic variants identified on the [facility] Common Hereditary Cancers Panel + STAT Breast Cancer panel.The STAT Breast cancer panel offered by [facility] includes sequencing and rearrangement analysis for the following 9 genes:  ATM, BRCA1, BRCA2, CDH1, CHEK2, PALB2, PTEN, STK11 and TP53. The Common Hereditary Cancers Panel offered by [facility] includes sequencing and/or deletion duplication testing of the following 47 genes: APC, ATM, AXIN2, BARD1, BMPR1A, BRCA1, BRCA2, BRIP1, CDH1, CDKN2A (p14ARF), CDKN2A (p16INK4a), CKD4, CHEK2, CTNNA1, DICER1, EPCAM (Deletion/duplication testing only), GREM1 (promoter region deletion/duplication testing only), KIT, MEN1, MLH1, MSH2, MSH3, MSH6, MUTYH, NBN, NF1, NHTL1, PALB2, PDGFRA, PMS2, POLD1, POLE, PTEN, RAD50, RAD51C, RAD51D, SDHB, SDHC, SDHD, SMAD4, SMARCA4. STK11, TP53, TSC1, TSC2, and VHL.  The following genes were evaluated for sequence changes only: SDHA and HOXB13 c.251G>A variant only. The report date is 06/05/2019.    06/14/2019 Surgery   Left lumpectomy Barry Dienes) [phone]): IDC, grade 2, with high  grade DCIS, 1.7cm, clear margins, 2 left axillary lymph nodes negative for carcinoma.    06/24/2019 Oncotype testing   Recurrence score: 13; low risk, distant recurrence at 9 years: 4%   07/13/2019 - 08/31/2019 Radiation Therapy   The patient initially received a dose of 50.4 Gy in 28 fractions to the breast using whole-breast tangent fields. This was delivered using a 3-D conformal technique. The patient then received a boost to the seroma. This delivered an additional 10 Gy in 5 fractions using a 3-field photon boost technique. The total dose was 60.4 Gy.   09/2019 - 09/2024 Anti-estrogen oral therapy   Tamoxifen     CHIEF COMPLIANT: Follow-up of left breast cancer to discuss antiestrogen therapy  INTERVAL HISTORY: Erin Gallagher is a 50 y.o. with above-mentioned history of left breast cancer treated with left lumpectomy, radiation, and is currently on antiestrogen therapy with tamoxifen. Mammogram on 06/15/2020 showed no evidence of malignancy. She presents to the clinic today for follow-up.  She is tolerating tamoxifen extremely well without any problems or concerns.  She has occasional hot flashes.  Denies any lumps or nodules in the breast.  ALLERGIES:  is allergic to other.  MEDICATIONS:  Current Outpatient Medications  Medication Sig Dispense Refill   amLODipine (NORVASC) 10 MG tablet Take 1 tablet by mouth daily. 30 tablet 0   Ascorbic Acid (VITAMIN C PO) Take by mouth.     cyclobenzaprine (FLEXERIL) 10 MG tablet Take 0.5-1 tablets (5-10 mg total) by mouth 3 (three) times daily as needed for muscle spasms. 60 tablet 0   fluconazole (DIFLUCAN) 150 MG tablet Take 1 tablet by mouth today and the second pill in 3 days (Patient not taking: Reported on 02/06/2021) 2 tablet  0   losartan-hydrochlorothiazide (HYZAAR) 50-12.5 MG tablet TAKE 1 TABLET BY MOUTH DAILY. 30 tablet 0   Multiple Vitamin (MULTIVITAMIN PO) Take by mouth.     tamoxifen (NOLVADEX) 20 MG tablet TAKE 1 TABLET BY MOUTH ONCE  DAILY 90 tablet 3   VITAMIN D PO Take by mouth.     No current facility-administered medications for this visit.    PHYSICAL EXAMINATION: ECOG PERFORMANCE STATUS: 1 - Symptomatic but completely ambulatory  Vitals:   05/30/21 0940  BP: 133/86  Pulse: 84  Resp: 18  Temp: 97.8 F (36.6 C)  SpO2: 98%   Filed Weights   05/30/21 0940  Weight: 251 lb 8 oz (114.1 kg)      LABORATORY DATA:  I have reviewed the data as listed CMP Latest Ref Rng & Units 06/01/2020 03/01/2020 09/08/2019  Glucose 65 - 99 mg/dL 90 87 131(H)  BUN 6 - 24 mg/dL 8 9 9   Creatinine 0.57 - 1.00 mg/dL 0.73 0.74 0.69  Sodium 134 - 144 mmol/L 139 138 138  Potassium 3.5 - 5.2 mmol/L 4.4 4.3 3.8  Chloride 96 - 106 mmol/L 101 105 105  CO2 20 - 29 mmol/L 22 21 20   Calcium 8.7 - 10.2 mg/dL 8.7 8.7 8.9  Total Protein 6.0 - 8.5 g/dL - 6.8 6.5  Total Bilirubin 0.0 - 1.2 mg/dL - 0.3 <0.2  Alkaline Phos 48 - 121 IU/L - 64 73  AST 0 - 40 IU/L - 13 16  ALT 0 - 32 IU/L - 14 19    Lab Results  Component Value Date   WBC 6.7 06/01/2020   HGB 13.4 06/01/2020   HCT 41.8 06/01/2020   MCV 83 06/01/2020   PLT 339 06/01/2020   NEUTROABS 4.6 05/25/2019    ASSESSMENT & PLAN:  Malignant neoplasm of upper-outer quadrant of left breast in female, estrogen receptor positive ([place]) 05/20/2019: Routine screening mammogram detected a 1.4cm left breast mass at the 2:30 position, no axillary adenopathy. 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  T1CN0 stage Ia clinical stage   06/14/2019: Left lumpectomy: Grade 2 IDC, 1.7 cm, high-grade DCIS, margins negative, no lymphovascular or perineural invasion, 0/2 lymph nodes negative, ER 100%, PR 100%, HER-2 negative, Ki-67 10% Oncotype DX score: 13, low risk, risk of distant recurrence at 9 years 4% Genetic testing: Negative Adjuvant radiation therapy: 07/14/2019-08/29/2019   Current treatment: Adjuvant antiestrogen therapy with tamoxifen 20 mg  daily, could switch to aromatase inhibitor once she is menopausal   Tamoxifen toxicities: Right lower extremity muscle aches and pains: I instructed her to take tamoxifen at bedtime and also to take vitamin D and tonic water.   Breast cancer surveillance: 1.  Mammogram   06/15/2020: Benign breast density category B 2. breast exam 05/30/2020: Benign   Return to clinic in 1 year for follow-up      No orders of the defined types were placed in this encounter.  The patient has a good understanding of the overall plan. she agrees with it. she will call with any problems that may develop before the next visit here.  Total time spent: 30 mins including face to face time and time spent for planning, charting and coordination of care  Rulon Eisenmenger, MD, MPH 05/30/2021  I, Thana Ates, am acting as scribe for Dr. Nicholas Lose.  I have reviewed the above documentation for accuracy and completeness, and I agree with the above.

## 2021-05-30 ENCOUNTER — Other Ambulatory Visit: Payer: Self-pay

## 2021-05-30 ENCOUNTER — Inpatient Hospital Stay: Payer: 59 | Attending: Hematology and Oncology | Admitting: Hematology and Oncology

## 2021-05-30 DIAGNOSIS — M79604 Pain in right leg: Secondary | ICD-10-CM | POA: Insufficient documentation

## 2021-05-30 DIAGNOSIS — Z79810 Long term (current) use of selective estrogen receptor modulators (SERMs): Secondary | ICD-10-CM | POA: Diagnosis not present

## 2021-05-30 DIAGNOSIS — C50412 Malignant neoplasm of upper-outer quadrant of left female breast: Secondary | ICD-10-CM | POA: Diagnosis not present

## 2021-05-30 DIAGNOSIS — Z170 Estrogen receptor positive status [ER+]: Secondary | ICD-10-CM | POA: Diagnosis not present

## 2021-05-30 DIAGNOSIS — Z923 Personal history of irradiation: Secondary | ICD-10-CM | POA: Insufficient documentation

## 2021-05-30 NOTE — Assessment & Plan Note (Signed)
05/20/2019:Routine screening mammogram detected a 1.4cm left breast mass at the 2:30 position, no axillary adenopathy. 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 T1CN0 stage Ia clinical stage  06/14/2019: Left lumpectomy: Grade 2 IDC, 1.7 cm, high-grade DCIS, margins negative, no lymphovascular or perineural invasion, 0/2 lymph nodes negative, ER 100%, PR 100%, HER-2 negative, Ki-67 10% Oncotype DX score: 13, low risk, risk of distant recurrence at 9 years 4% Genetic testing: Negative Adjuvant radiation therapy: 07/14/2019-08/29/2019  Current treatment: Adjuvant antiestrogen therapywith tamoxifen 20 mg daily, could switch to aromatase inhibitor once she is menopausal  Tamoxifen toxicities: Right lower extremity muscle aches and pains: I instructed her to take tamoxifen at bedtime and also to take vitamin D and tonic water.  Breast cancer surveillance: 1.  Mammogram   06/15/2020: Benign breast density category B 2. breast exam 05/30/2020: Benign  Return to clinic in 1 year for follow-up

## 2021-05-31 ENCOUNTER — Other Ambulatory Visit: Payer: Self-pay | Admitting: General Surgery

## 2021-05-31 DIAGNOSIS — Z9889 Other specified postprocedural states: Secondary | ICD-10-CM

## 2021-06-04 ENCOUNTER — Other Ambulatory Visit (HOSPITAL_COMMUNITY): Payer: Self-pay

## 2021-06-04 MED ORDER — AMOXICILLIN 500 MG PO CAPS
ORAL_CAPSULE | ORAL | 1 refills | Status: DC
  Filled 2021-06-04: qty 21, 6d supply, fill #0

## 2021-06-04 MED FILL — Tamoxifen Citrate Tab 20 MG (Base Equivalent): ORAL | 90 days supply | Qty: 90 | Fill #1 | Status: AC

## 2021-06-05 ENCOUNTER — Other Ambulatory Visit (HOSPITAL_COMMUNITY): Payer: Self-pay

## 2021-06-06 ENCOUNTER — Other Ambulatory Visit (HOSPITAL_COMMUNITY): Payer: Self-pay

## 2021-06-06 MED ORDER — AMOXICILLIN 500 MG PO CAPS
ORAL_CAPSULE | ORAL | 0 refills | Status: DC
  Filled 2021-06-06 – 2021-06-10 (×2): qty 21, 7d supply, fill #0

## 2021-06-10 ENCOUNTER — Other Ambulatory Visit (HOSPITAL_COMMUNITY): Payer: Self-pay

## 2021-06-13 ENCOUNTER — Other Ambulatory Visit: Payer: Self-pay | Admitting: Emergency Medicine

## 2021-06-14 ENCOUNTER — Other Ambulatory Visit (HOSPITAL_COMMUNITY): Payer: Self-pay

## 2021-06-14 MED ORDER — AMLODIPINE BESYLATE 10 MG PO TABS
10.0000 mg | ORAL_TABLET | Freq: Every day | ORAL | 0 refills | Status: DC
  Filled 2021-06-14: qty 30, 30d supply, fill #0

## 2021-06-18 DIAGNOSIS — K023 Arrested dental caries: Secondary | ICD-10-CM | POA: Diagnosis not present

## 2021-06-19 ENCOUNTER — Ambulatory Visit
Admission: RE | Admit: 2021-06-19 | Discharge: 2021-06-19 | Disposition: A | Discharge status: Home / Self Care | Payer: 59 | Source: Ambulatory Visit | Attending: General Surgery | Admitting: General Surgery

## 2021-06-19 ENCOUNTER — Other Ambulatory Visit: Payer: Self-pay

## 2021-06-19 DIAGNOSIS — Z853 Personal history of malignant neoplasm of breast: Secondary | ICD-10-CM | POA: Diagnosis not present

## 2021-06-19 DIAGNOSIS — Z9889 Other specified postprocedural states: Secondary | ICD-10-CM

## 2021-06-19 DIAGNOSIS — R928 Other abnormal and inconclusive findings on diagnostic imaging of breast: Secondary | ICD-10-CM | POA: Diagnosis not present

## 2021-06-27 ENCOUNTER — Other Ambulatory Visit: Payer: Self-pay | Admitting: Emergency Medicine

## 2021-06-28 ENCOUNTER — Other Ambulatory Visit (HOSPITAL_COMMUNITY): Payer: Self-pay

## 2021-06-28 MED ORDER — LOSARTAN POTASSIUM-HCTZ 50-12.5 MG PO TABS
1.0000 | ORAL_TABLET | Freq: Every day | ORAL | 0 refills | Status: DC
  Filled 2021-06-28: qty 30, 30d supply, fill #0

## 2021-07-23 ENCOUNTER — Other Ambulatory Visit: Payer: Self-pay | Admitting: Emergency Medicine

## 2021-07-24 ENCOUNTER — Other Ambulatory Visit (HOSPITAL_COMMUNITY): Payer: Self-pay

## 2021-07-24 MED ORDER — AMLODIPINE BESYLATE 10 MG PO TABS
10.0000 mg | ORAL_TABLET | Freq: Every day | ORAL | 3 refills | Status: DC
  Filled 2021-07-24: qty 90, 90d supply, fill #0
  Filled 2021-11-17: qty 90, 90d supply, fill #1
  Filled 2022-03-23: qty 90, 90d supply, fill #2
  Filled 2022-07-13: qty 90, 90d supply, fill #3

## 2021-08-08 IMAGING — MG DIGITAL SCREENING BILATERAL MAMMOGRAM WITH CAD
4 series · 4 of 4 positions shown · non-contrast
Comparison: None available.

CLINICAL DATA: Screening.

EXAM:
DIGITAL SCREENING BILATERAL MAMMOGRAM WITH CAD

[L MLO]
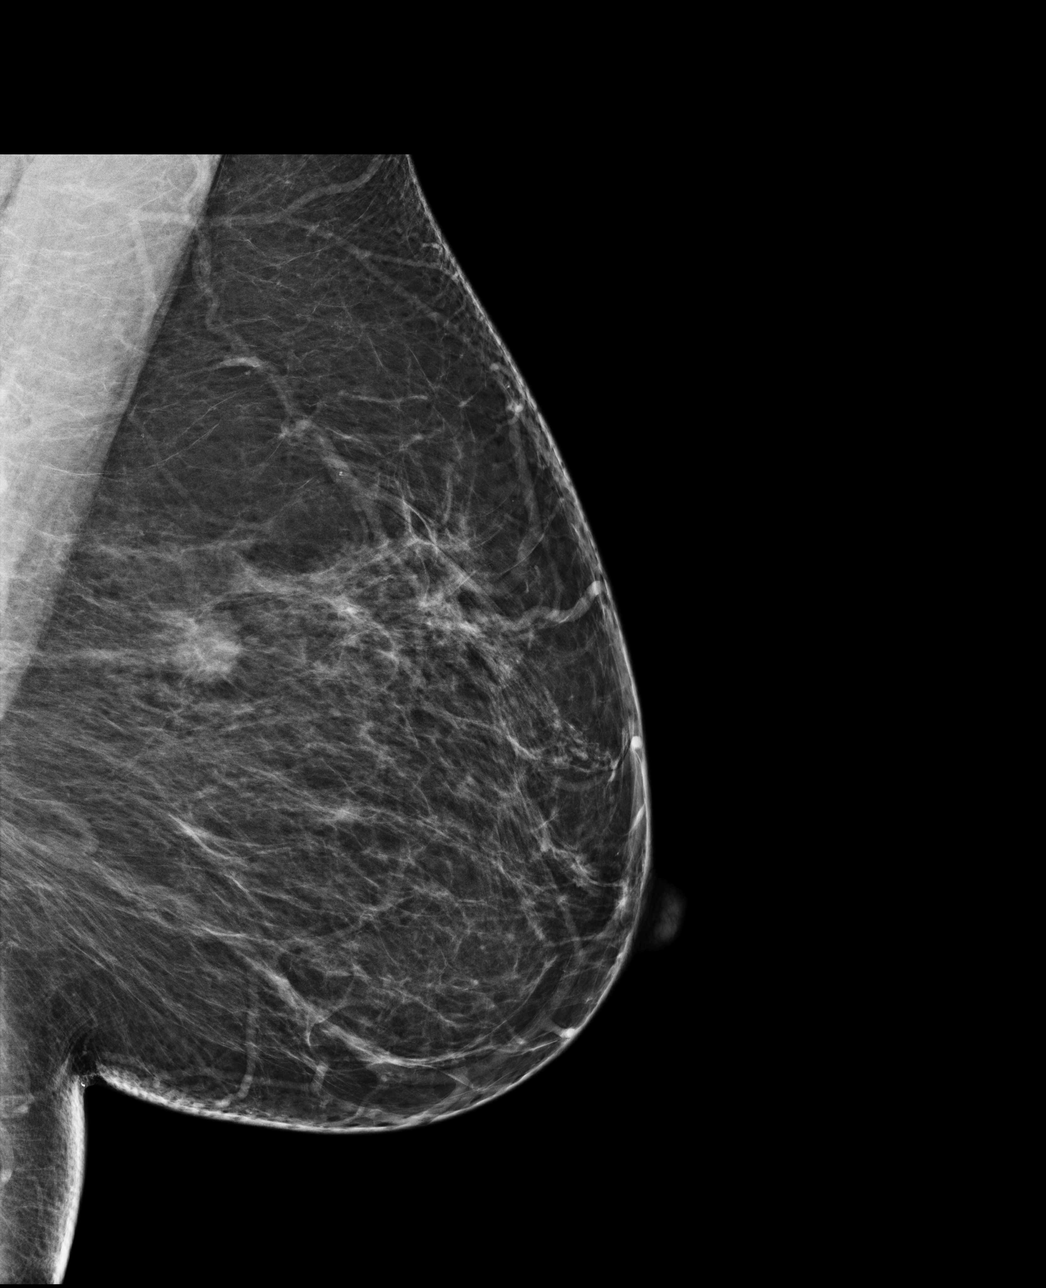

[R MLO]
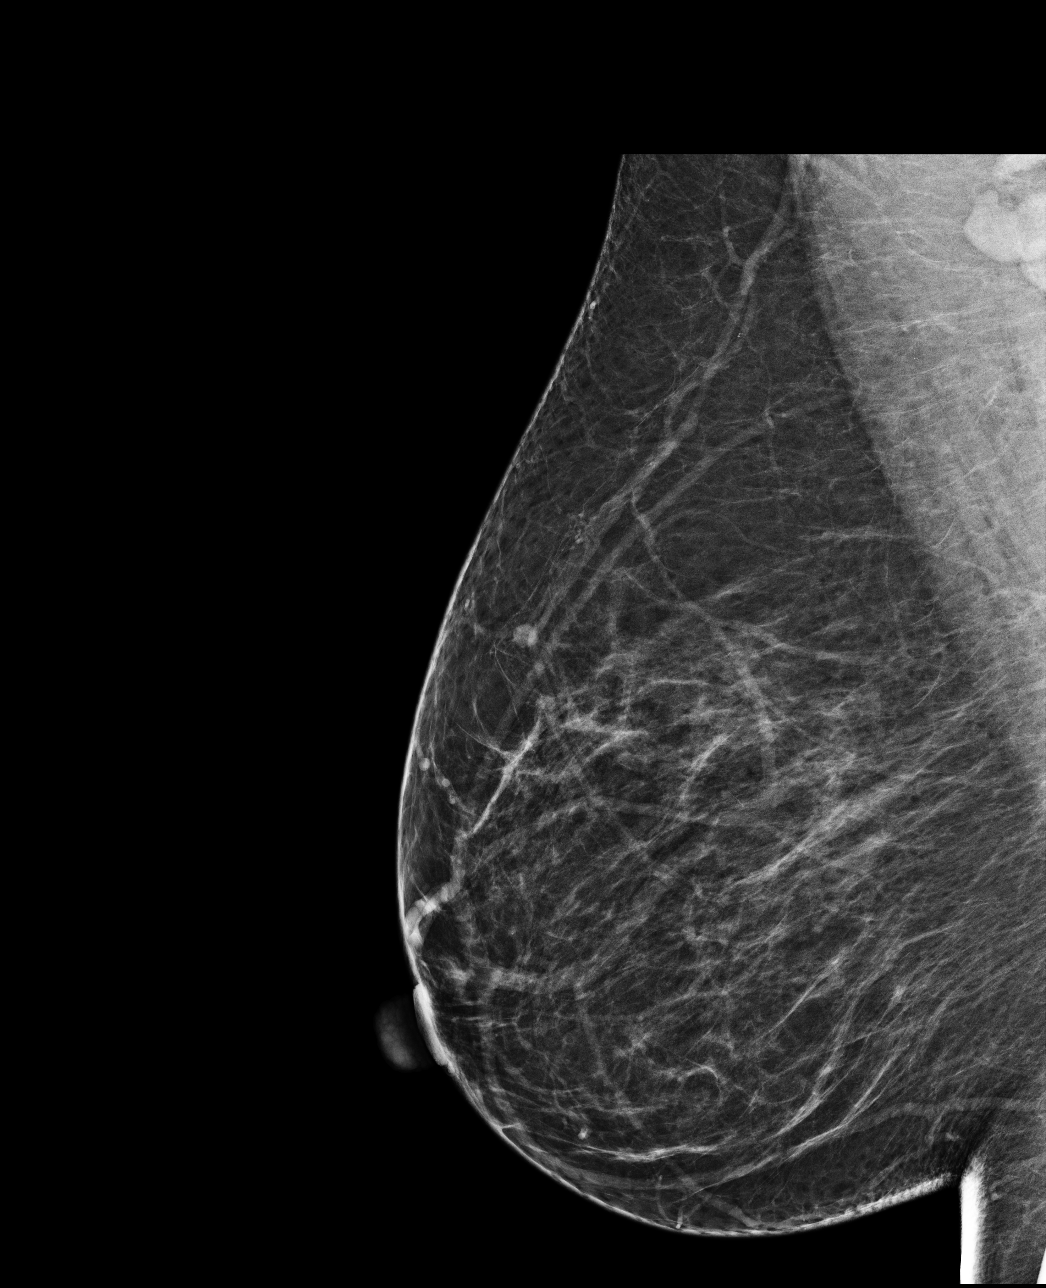

[R CC]
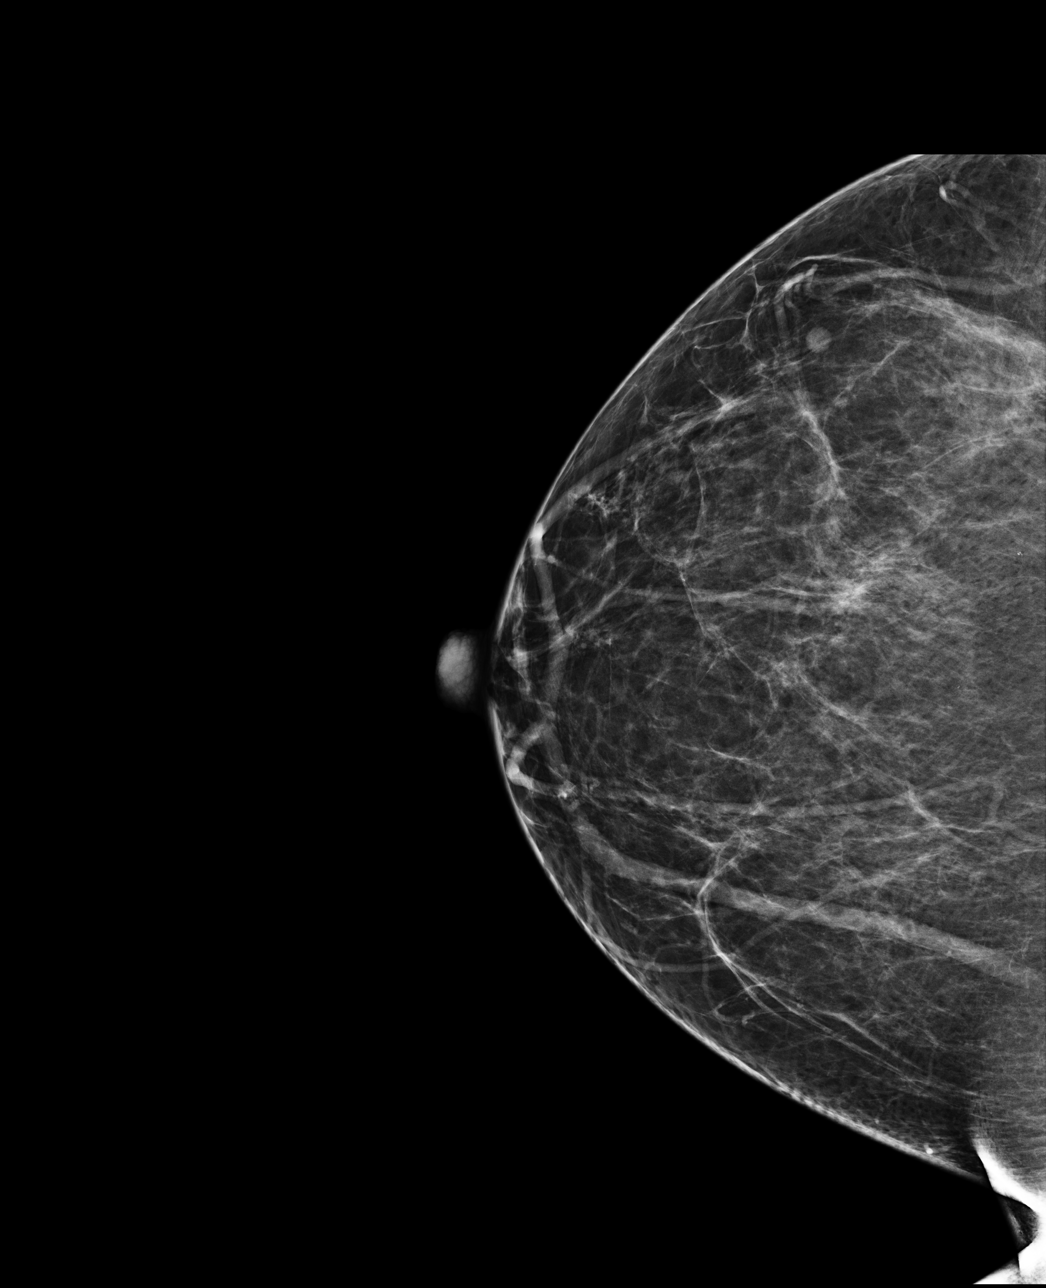

[L CC]
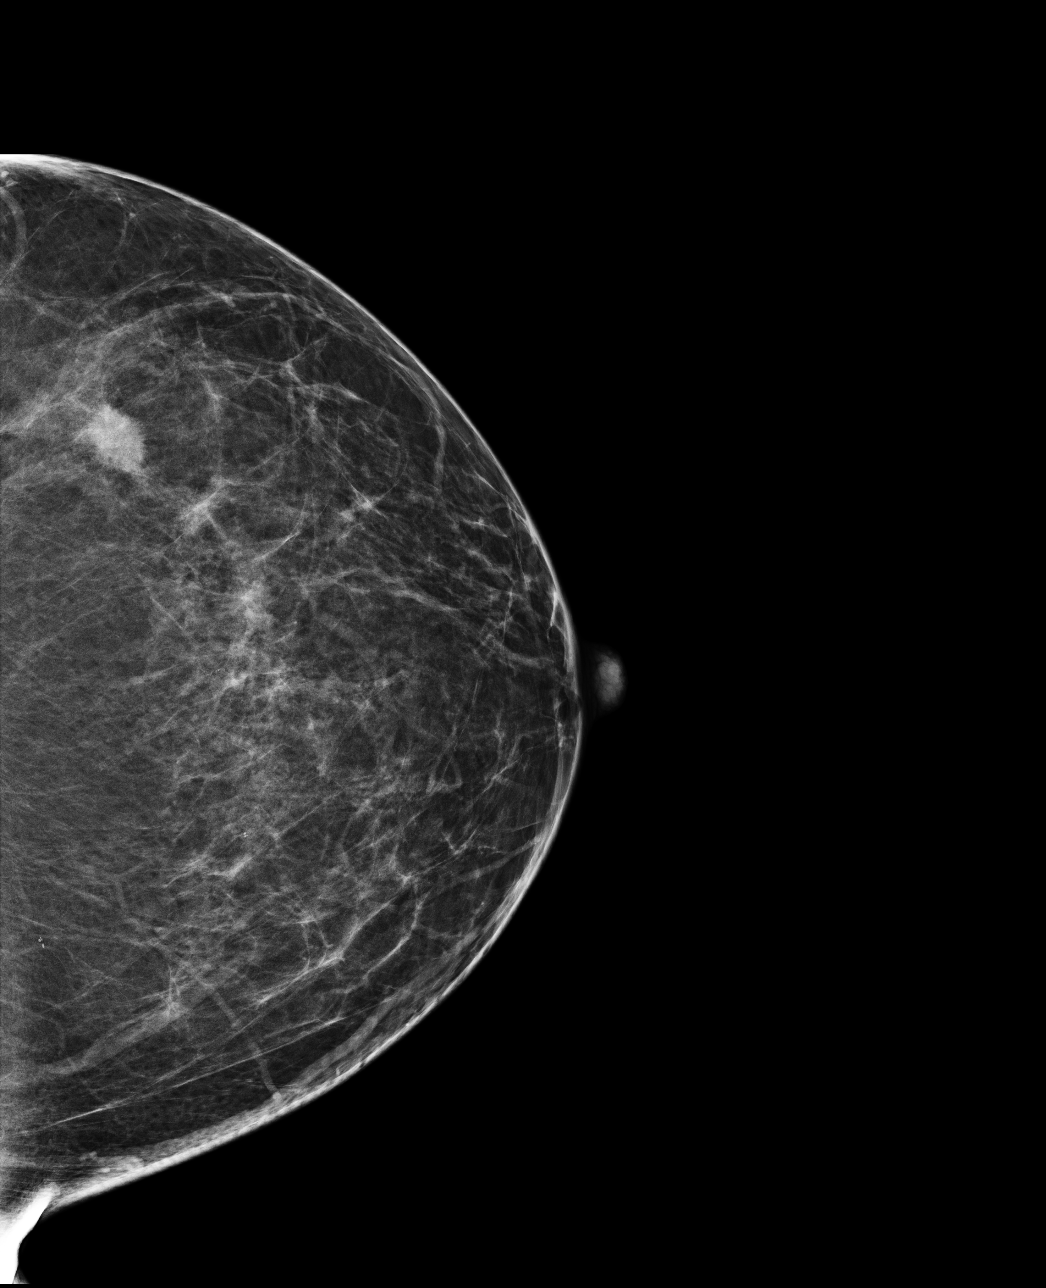

[4 of 4 positions shown; findings below may reference images not displayed]

ACR Breast Density Category b: There are scattered areas of
fibroglandular density.
FINDINGS: In the right breast possible right axillary lymphadenopathy requires
further evaluation.

In the left breast a mass and a focal asymmetry require further
evaluation.

Images were processed with CAD.
IMPRESSION: Further evaluation is suggested for possible right axillary
lymphadenopathy.

Further evaluation is suggested for possible left breast mass and
focal asymmetry in the left breast.

RECOMMENDATION:
Diagnostic mammogram and possibly ultrasound of both breasts.
(Code:[id number])

The patient will be contacted regarding the findings, and additional
imaging will be scheduled.

BI-RADS CATEGORY  0: Incomplete. Need additional imaging evaluation
and/or prior mammograms for comparison.

## 2021-08-09 ENCOUNTER — Telehealth: Payer: 59 | Admitting: Physician Assistant

## 2021-08-09 ENCOUNTER — Other Ambulatory Visit (HOSPITAL_COMMUNITY): Payer: Self-pay

## 2021-08-09 DIAGNOSIS — J111 Influenza due to unidentified influenza virus with other respiratory manifestations: Secondary | ICD-10-CM | POA: Diagnosis not present

## 2021-08-09 MED ORDER — OSELTAMIVIR PHOSPHATE 75 MG PO CAPS
75.0000 mg | ORAL_CAPSULE | Freq: Two times a day (BID) | ORAL | 0 refills | Status: DC
  Filled 2021-08-09: qty 10, 5d supply, fill #0

## 2021-08-09 NOTE — Progress Notes (Signed)

## 2021-08-17 ENCOUNTER — Other Ambulatory Visit (HOSPITAL_COMMUNITY): Payer: Self-pay

## 2021-08-17 DIAGNOSIS — I10 Essential (primary) hypertension: Secondary | ICD-10-CM | POA: Diagnosis not present

## 2021-08-17 DIAGNOSIS — J209 Acute bronchitis, unspecified: Secondary | ICD-10-CM | POA: Diagnosis not present

## 2021-08-17 DIAGNOSIS — J019 Acute sinusitis, unspecified: Secondary | ICD-10-CM | POA: Diagnosis not present

## 2021-08-17 MED ORDER — AZITHROMYCIN 250 MG PO TABS
ORAL_TABLET | ORAL | 0 refills | Status: DC
  Filled 2021-08-17: qty 6, 5d supply, fill #0

## 2021-08-17 MED ORDER — AMOXICILLIN-POT CLAVULANATE 875-125 MG PO TABS
ORAL_TABLET | ORAL | 0 refills | Status: DC
  Filled 2021-08-17: qty 20, 10d supply, fill #0

## 2021-08-17 MED ORDER — CHLORPHEN-PE-ACETAMINOPHEN 4-10-325 MG PO TABS
ORAL_TABLET | ORAL | 0 refills | Status: DC
  Filled 2021-08-17: qty 20, 4d supply, fill #0

## 2021-08-17 MED ORDER — PREDNISONE 10 MG (48) PO TBPK
ORAL_TABLET | ORAL | 0 refills | Status: DC
  Filled 2021-08-17: qty 48, 12d supply, fill #0

## 2021-08-19 ENCOUNTER — Other Ambulatory Visit: Payer: Self-pay | Admitting: Emergency Medicine

## 2021-08-20 ENCOUNTER — Other Ambulatory Visit (HOSPITAL_COMMUNITY): Payer: Self-pay

## 2021-08-20 MED ORDER — LOSARTAN POTASSIUM-HCTZ 50-12.5 MG PO TABS
1.0000 | ORAL_TABLET | Freq: Every day | ORAL | 3 refills | Status: DC
  Filled 2021-08-20: qty 90, 90d supply, fill #0
  Filled 2021-12-03: qty 90, 90d supply, fill #1
  Filled 2022-03-23: qty 90, 90d supply, fill #2
  Filled 2022-07-13: qty 90, 90d supply, fill #3

## 2021-09-02 ENCOUNTER — Other Ambulatory Visit (HOSPITAL_COMMUNITY): Payer: Self-pay

## 2021-09-02 MED ORDER — PREDNISONE 20 MG PO TABS
20.0000 mg | ORAL_TABLET | ORAL | 0 refills | Status: DC
  Filled 2021-09-02: qty 15, 5d supply, fill #0

## 2021-09-24 IMAGING — MG MM BREAST LOCALIZATION CLIP
2 series · 2 of 2 positions shown · non-contrast
Comparison: Previous exam(s).

CLINICAL DATA: Mammogram status post ultrasound-guided radioactive
seed localization of the left breast.

EXAM:
DIAGNOSTIC LEFT MAMMOGRAM POST ULTRASOUND-GUIDED RADIOACTIVE SEED
PLACEMENT

[L ML]
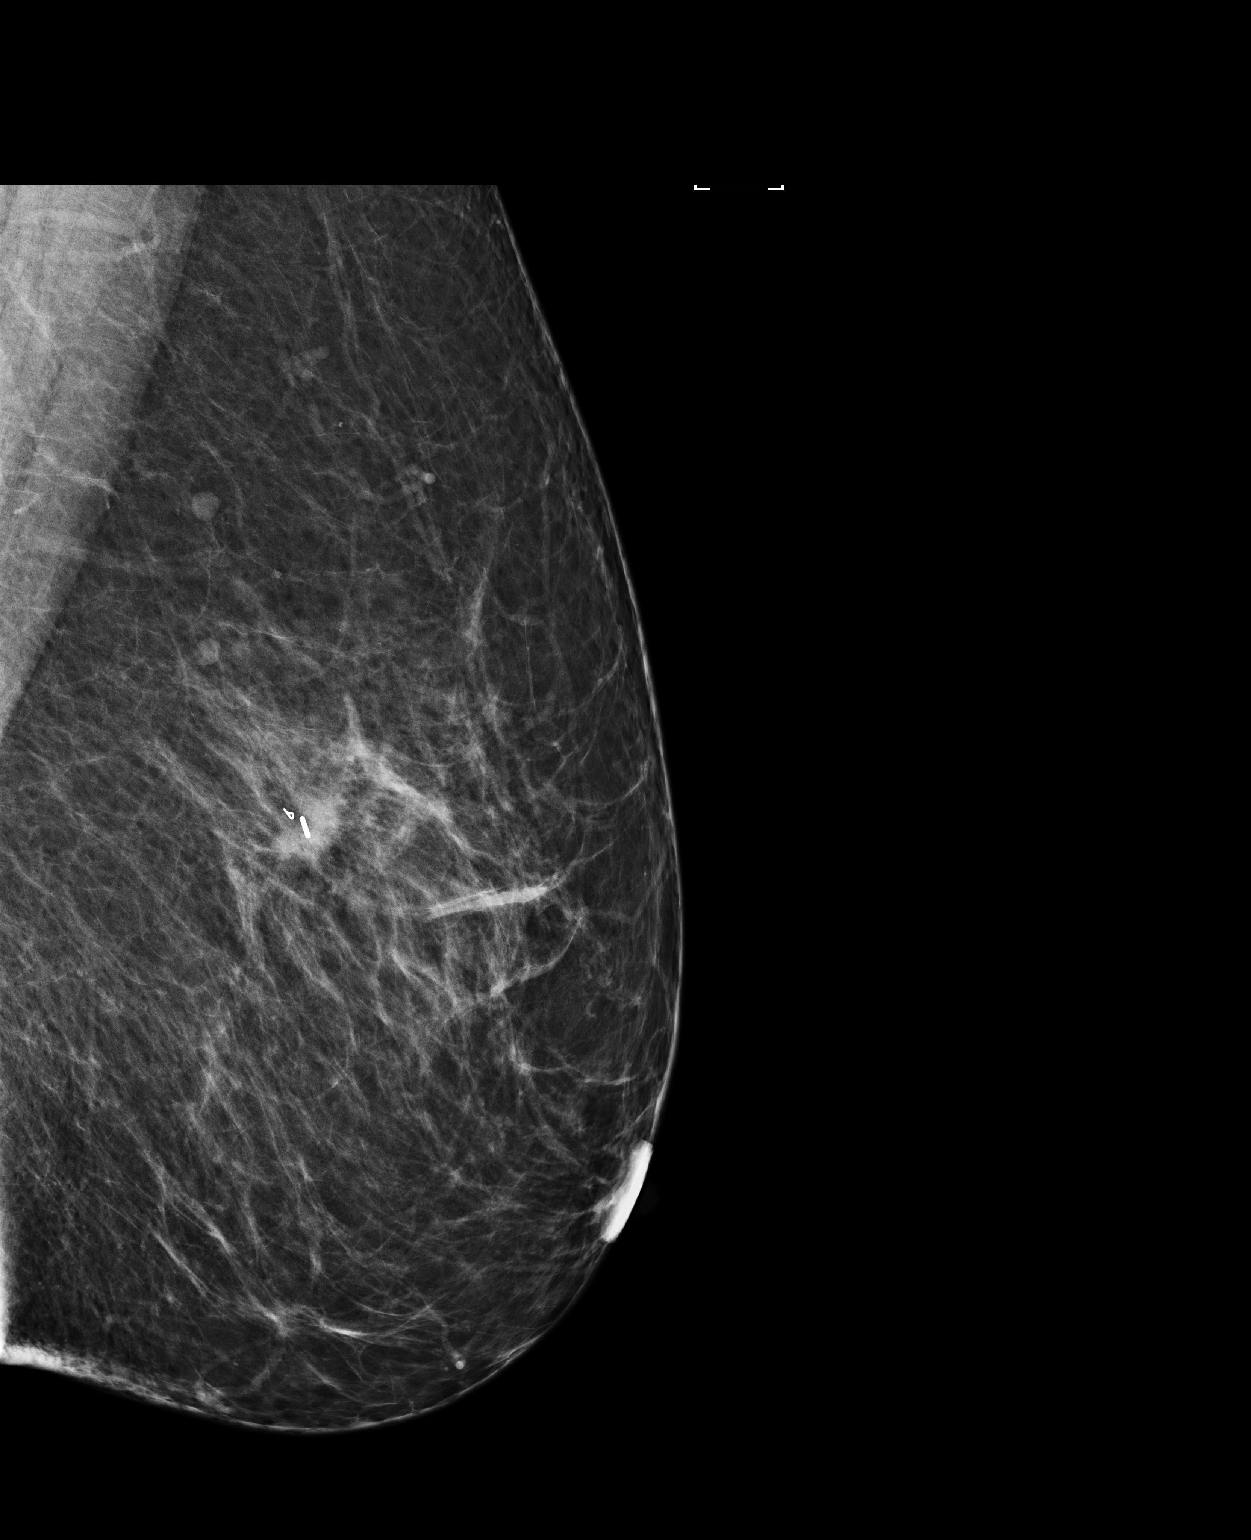

[L CC]
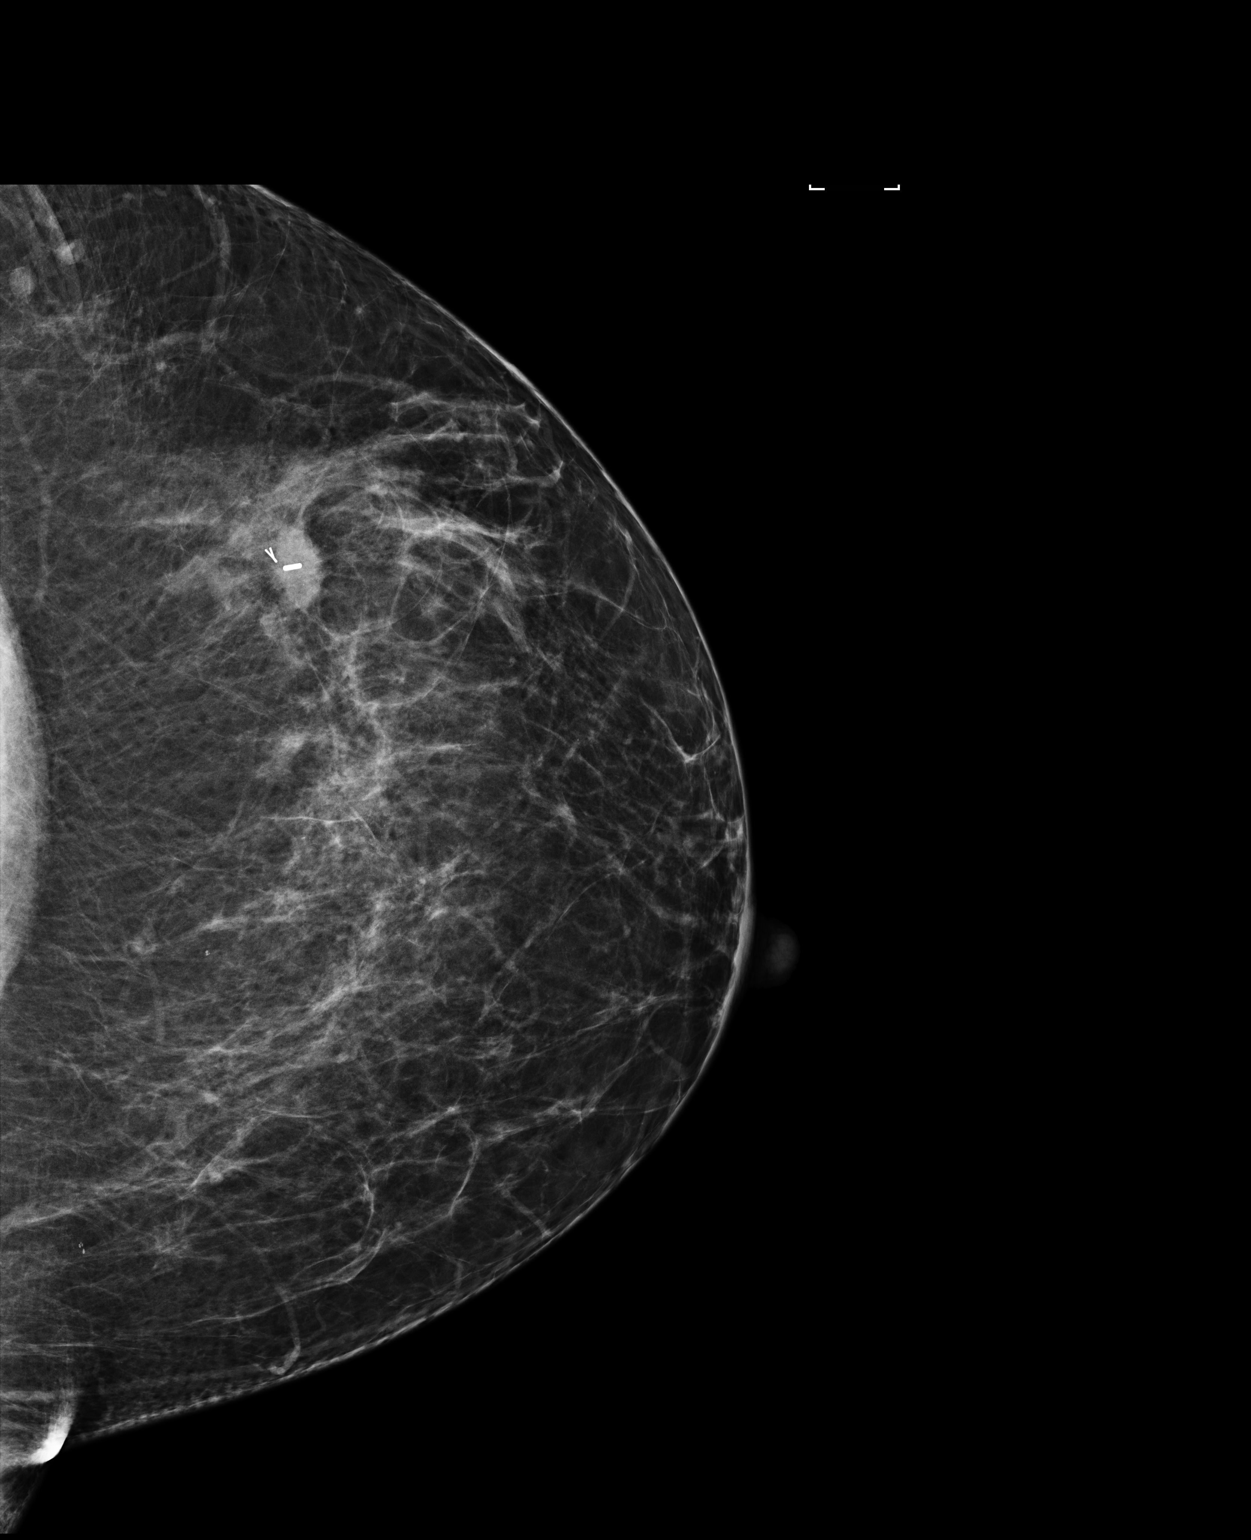

[2 of 2 positions shown; findings below may reference images not displayed]

FINDINGS: Mammographic images were obtained following ultrasound-guided
radioactive seed placement. These demonstrate that the radioactive
seed is within the mass in the upper-outer left breast, adjacent to
the ribbon shaped biopsy marking clip.
IMPRESSION: Appropriate location of the radioactive seed.

Final Assessment: Post Procedure Mammograms for Seed Placement

## 2021-10-01 ENCOUNTER — Other Ambulatory Visit: Payer: Self-pay | Admitting: Hematology and Oncology

## 2021-10-02 ENCOUNTER — Other Ambulatory Visit (HOSPITAL_COMMUNITY): Payer: Self-pay

## 2021-10-02 MED ORDER — TAMOXIFEN CITRATE 20 MG PO TABS
ORAL_TABLET | Freq: Every day | ORAL | 3 refills | Status: DC
  Filled 2021-10-02: qty 90, 90d supply, fill #0
  Filled 2022-02-07: qty 90, 90d supply, fill #1
  Filled 2022-06-24: qty 90, 90d supply, fill #2

## 2021-10-29 ENCOUNTER — Encounter: Payer: Self-pay | Admitting: Gastroenterology

## 2021-10-30 ENCOUNTER — Ambulatory Visit (INDEPENDENT_AMBULATORY_CARE_PROVIDER_SITE_OTHER): Payer: No Typology Code available for payment source | Admitting: Emergency Medicine

## 2021-10-30 ENCOUNTER — Other Ambulatory Visit (HOSPITAL_COMMUNITY): Payer: Self-pay

## 2021-10-30 ENCOUNTER — Encounter: Payer: Self-pay | Admitting: Emergency Medicine

## 2021-10-30 ENCOUNTER — Other Ambulatory Visit: Payer: Self-pay

## 2021-10-30 VITALS — BP 128/80 | HR 83 | Ht 65.0 in | Wt 252.0 lb

## 2021-10-30 DIAGNOSIS — R202 Paresthesia of skin: Secondary | ICD-10-CM | POA: Diagnosis not present

## 2021-10-30 DIAGNOSIS — R29818 Other symptoms and signs involving the nervous system: Secondary | ICD-10-CM | POA: Diagnosis not present

## 2021-10-30 DIAGNOSIS — Z170 Estrogen receptor positive status [ER+]: Secondary | ICD-10-CM | POA: Diagnosis not present

## 2021-10-30 DIAGNOSIS — C50412 Malignant neoplasm of upper-outer quadrant of left female breast: Secondary | ICD-10-CM

## 2021-10-30 LAB — LIPID PANEL
Cholesterol: 167 mg/dL (ref 0–200)
HDL: 53.5 mg/dL (ref >39.00)
LDL Cholesterol: 80 mg/dL (ref 0–99)
NonHDL: 113.37
Total CHOL/HDL Ratio: 3
Triglycerides: 165 mg/dL — ABNORMAL HIGH (ref 0.0–149.0)
VLDL: 33 mg/dL (ref 0.0–40.0)

## 2021-10-30 LAB — CBC WITH DIFFERENTIAL/PLATELET
Basophils Absolute: 0.1 10*3/uL (ref 0.0–0.1)
Basophils Relative: 0.8 % (ref 0.0–3.0)
Eosinophils Absolute: 0.1 10*3/uL (ref 0.0–0.7)
Eosinophils Relative: 1.7 % (ref 0.0–5.0)
HCT: 40.4 % (ref 36.0–46.0)
Hemoglobin: 13.2 g/dL (ref 12.0–15.0)
Lymphocytes Relative: 26 % (ref 12.0–46.0)
Lymphs Abs: 1.7 10*3/uL (ref 0.7–4.0)
MCHC: 32.6 g/dL (ref 30.0–36.0)
MCV: 82.5 fl (ref 78.0–100.0)
Monocytes Absolute: 0.5 10*3/uL (ref 0.1–1.0)
Monocytes Relative: 7.5 % (ref 3.0–12.0)
Neutro Abs: 4.1 10*3/uL (ref 1.4–7.7)
Neutrophils Relative %: 64 % (ref 43.0–77.0)
Platelets: 289 10*3/uL (ref 150.0–400.0)
RBC: 4.9 Mil/uL (ref 3.87–5.11)
RDW: 14.8 % (ref 11.5–15.5)
WBC: 6.3 10*3/uL (ref 4.0–10.5)

## 2021-10-30 LAB — COMPREHENSIVE METABOLIC PANEL
ALT: 19 U/L (ref 0–35)
AST: 18 U/L (ref 0–37)
Albumin: 3.8 g/dL (ref 3.5–5.2)
Alkaline Phosphatase: 44 U/L (ref 39–117)
BUN: 10 mg/dL (ref 6–23)
CO2: 27 mEq/L (ref 19–32)
Calcium: 8.8 mg/dL (ref 8.4–10.5)
Chloride: 105 mEq/L (ref 96–112)
Creatinine, Ser: 0.74 mg/dL (ref 0.40–1.20)
GFR: 93.85 mL/min (ref >60.00)
Glucose, Bld: 119 mg/dL — ABNORMAL HIGH (ref 70–99)
Potassium: 3.5 mEq/L (ref 3.5–5.1)
Sodium: 139 mEq/L (ref 135–145)
Total Bilirubin: 0.4 mg/dL (ref 0.2–1.2)
Total Protein: 6.8 g/dL (ref 6.0–8.3)

## 2021-10-30 LAB — VITAMIN D 25 HYDROXY (VIT D DEFICIENCY, FRACTURES): VITD: 34.13 ng/mL (ref 30.00–100.00)

## 2021-10-30 LAB — VITAMIN B12: Vitamin B-12: 722 pg/mL (ref 211–911)

## 2021-10-30 LAB — HEMOGLOBIN A1C: Hgb A1c MFr Bld: 6.3 % (ref 4.6–6.5)

## 2021-10-30 LAB — TSH: TSH: 1.59 u[IU]/mL (ref 0.35–5.50)

## 2021-10-30 MED ORDER — CYCLOBENZAPRINE HCL 10 MG PO TABS
5.0000 mg | ORAL_TABLET | Freq: Three times a day (TID) | ORAL | 1 refills | Status: DC | PRN
  Filled 2021-10-30: qty 60, 20d supply, fill #0
  Filled 2022-09-30: qty 60, 20d supply, fill #1

## 2021-10-30 NOTE — Progress Notes (Signed)
Erin Gallagher 51 y.o.   Chief Complaint  Patient presents with   Numbness    Hands and feet , left side. Tingling at times, x 3 weeks   Medication Refill    flexeril    HISTORY OF PRESENT ILLNESS: This is a 51 y.o. female complaining of numbness and tingling to hands and feet for the past several months.  Felt off balance a couple times and fell without any significant injuries.  Sometimes tingling and numbness worse on the left arm. No history of sleep apnea but snores.  Wakes up tired.  Average sleep about 5 hours.  Daytime somnolence. Has occasional blurred vision but no headaches. No nausea or vomiting.  No dizziness. No changes in medications. Has history of breast cancer on tamoxifen. No fever or flulike symptoms. No recent blood work results. Has changed diet for the better. No other complaints or medical concerns today.  Medication Refill Pertinent negatives include no abdominal pain, chest pain, chills, congestion, coughing, fever, headaches, nausea, neck pain, rash, sore throat or vomiting.    Prior to Admission medications   Medication Sig Start Date End Date Taking? Authorizing Provider  amLODipine (NORVASC) 10 MG tablet Take 1 tablet (10 mg total) by mouth daily. 07/24/21 10/30/21 Yes Safa Derner, Ines Bloomer, MD  cyclobenzaprine (FLEXERIL) 10 MG tablet Take 0.5-1 tablets (5-10 mg total) by mouth 3 (three) times daily as needed for muscle spasms. 04/26/20  Yes Jacelyn Pi, Lilia Argue, MD  losartan-hydrochlorothiazide [facility]) 50-12.5 MG tablet Take 1 tablet by mouth daily. 08/20/21 11/19/21 Yes Horald Pollen, MD  tamoxifen (NOLVADEX) 20 MG tablet TAKE 1 TABLET BY MOUTH ONCE DAILY 10/02/21 10/02/22 Yes Nicholas Lose, MD  VITAMIN D PO Take by mouth.   Yes [provider]  Multiple Vitamin (MULTIVITAMIN PO) Take by mouth.    [provider]    Allergies  Allergen Reactions   Other Rash    Steri Strips cause itching and a rash    Patient Active  Problem List   Diagnosis Date Noted   BMI 40.0-44.9, adult ([place]) 02/06/2021   Morbid obesity ([place]) 02/06/2021   History of breast cancer 02/06/2021   Malignant neoplasm of upper-outer quadrant of left breast in female, estrogen receptor positive ([place]) 05/20/2019   Arthritis 11/23/2017   Essential hypertension 06/22/2013    Past Medical History:  Diagnosis Date   Arthritis    Blood transfusion without reported diagnosis    Cancer ([place])    left breast cancer   Family history of breast cancer    Family history of colon cancer    Family history of lung cancer    Family history of throat cancer    Gall bladder disease    gall bladder removal   Hypertension    Personal history of radiation therapy     Past Surgical History:  Procedure Laterality Date   BREAST BIOPSY Left 05/16/2019   BREAST LUMPECTOMY Left 06/14/2019   BREAST LUMPECTOMY WITH RADIOACTIVE SEED AND SENTINEL LYMPH NODE BIOPSY Left 06/14/2019   Procedure: LEFT BREAST LUMPECTOMY WITH RADIOACTIVE SEED AND SENTINEL LYMPH NODE BIOPSY;  Surgeon: [name], MD;  Location: [facility];  Service: General;  Laterality: Left;   [place]    Social History   Socioeconomic History   Marital status: Married    Spouse name: Not on file   Number of children:  1   Years of education: Not on file   Highest education level: Not on file  Occupational History   Occupation: Supervisor  Tobacco Use   Smoking status: Never   Smokeless tobacco: Never  Vaping Use   Vaping Use: Never used  Substance and Sexual Activity   Alcohol use: Yes    Alcohol/week: 2.0 standard drinks    Types: 2 Standard drinks or equivalent per week    Comment: occasionally   Drug use: No   Sexual activity: Yes    Birth control/protection: Surgical    Comment: Tubal ligation  Other Topics Concern   Not on file  Social History Narrative   Not on file   Social  Determinants of Health   Financial Resource Strain: Not on file  Food Insecurity: Not on file  Transportation Needs: Not on file  Physical Activity: Not on file  Stress: Not on file  Social Connections: Not on file  Intimate Partner Violence: Not on file    Family History  Problem Relation Age of Onset   Hypertension Mother    Hypertension Father    Colon cancer Father        dx 63s   Stroke Maternal Grandmother    Hypertension Maternal Grandmother    Aneurysm Maternal Grandmother    Throat cancer Maternal Uncle    Lung cancer Paternal Uncle      Review of Systems  Constitutional: Negative.  Negative for chills and fever.  HENT: Negative.  Negative for congestion, nosebleeds and sore throat.   Eyes:  Positive for blurred vision (Occasional). Negative for double vision, photophobia, pain and redness.  Respiratory: Negative.  Negative for cough and shortness of breath.   Cardiovascular: Negative.  Negative for chest pain and palpitations.  Gastrointestinal:  Negative for abdominal pain, diarrhea, heartburn, nausea and vomiting.  Genitourinary: Negative.   Musculoskeletal:  Negative for joint pain and neck pain.  Skin: Negative.  Negative for rash.  Neurological:  Positive for tingling. Negative for dizziness, speech change, focal weakness and headaches.  Endo/Heme/Allergies: Negative.   Psychiatric/Behavioral: Negative.    All other systems reviewed and are negative.  Today's Vitals   10/30/21 1359  BP: 128/80  Pulse: 83  SpO2: 93%  Weight: 252 lb (114.3 kg)  Height: 5\' 5"  (1.651 m)   Body mass index is 41.93 kg/m.  Physical Exam Vitals reviewed.  Constitutional:      Appearance: Normal appearance. She is obese.  HENT:     Head: Normocephalic.  Eyes:     Extraocular Movements: Extraocular movements intact.     Conjunctiva/sclera: Conjunctivae normal.     Pupils: Pupils are equal, round, and reactive to light.  Neck:     Vascular: No carotid bruit.   Cardiovascular:     Rate and Rhythm: Normal rate and regular rhythm.     Pulses: Normal pulses.     Heart sounds: Normal heart sounds.  Pulmonary:     Effort: Pulmonary effort is normal.     Breath sounds: Normal breath sounds.  Musculoskeletal:     Cervical back: No tenderness.     Right lower leg: No edema.     Left lower leg: No edema.     Comments: Left upper extremity: No axillary adenopathy.  Neurovascularly intact.  Full range of motion.  No tenderness or swelling. Feet: Warm to touch.  Neurovascularly intact.  Excellent distal pulses.  Good sensation.  No abnormal findings.  Lymphadenopathy:     Cervical: No  cervical adenopathy.  Skin:    General: Skin is warm and dry.     Capillary Refill: Capillary refill takes less than 2 seconds.  Neurological:     General: No focal deficit present.     Mental Status: She is alert and oriented to person, place, and time.     Cranial Nerves: No cranial nerve deficit.     Sensory: No sensory deficit.     Motor: No weakness.     Coordination: Coordination normal.     Gait: Gait normal.  Psychiatric:        Mood and Affect: Mood normal.        Behavior: Behavior normal.     ASSESSMENT & PLAN: Problem List Items Addressed This Visit       Other   Malignant neoplasm of upper-outer quadrant of left breast in female, estrogen receptor positive ([place])    Stable.  No palpable left axillary lymph nodes.  Relationship to present symptoms uncertain at this time.      Suspected sleep apnea    She may very likely have sleep apnea.  Needs work-up. Refer to [facility] for sleep studies and evaluation.       Relevant Orders   Ambulatory referral to Neurology   Paresthesia - Primary    Differential diagnosis discussed with patient.  Needs work-up. Blood work done today.  Suspected sleep apnea.      Relevant Medications   cyclobenzaprine (FLEXERIL) 10 MG tablet   Other Relevant Orders   CBC with Differential/Platelet   Comprehensive  metabolic panel   Vitamin Z61   VITAMIN D 25 Hydroxy (Vit-D Deficiency, Fractures)   Hemoglobin A1c   TSH   Lipid panel   Patient Instructions  Paresthesia Paresthesia is a burning or prickling feeling. This feeling can happen in any part of the body. It often happens in the hands, arms, legs, or feet. Usually, it is not painful. In most cases, the feeling goes away in a short time and is not a sign of a serious problem. If you have paresthesia that lasts a long time, you need to see your doctor. Follow these instructions at home: Nutrition Eat a healthy diet. This includes: Eating foods that are high in fiber. These include beans, whole grains, and fresh fruits and vegetables. Limiting foods that are high in fat and sugar. These include fried or sweet foods.  Alcohol use  Do not drink alcohol if: Your doctor tells you not to drink. You are pregnant, may be pregnant, or are planning to become pregnant. If you drink alcohol: Limit how much you have to: 0-1 drink a day for women. 0-2 drinks a day for men. Know how much alcohol is in your drink. In the [place] one drink equals one 12 oz bottle of beer (355 mL), one 5 oz glass of wine (148 mL), or one 1 oz glass of hard liquor (44 mL). General instructions Take over-the-counter and prescription medicines only as told by your doctor. Do not smoke or use any products that contain nicotine or tobacco. If you need help quitting, ask your doctor. If you have diabetes, work with your doctor to make sure your blood sugar stays in a healthy range. If your feet feel numb: Check for redness, warmth, and swelling every day. Wear padded socks and comfortable shoes. These help protect your feet. Keep all follow-up visits. Contact a doctor if: You have paresthesia that gets worse or does not go away. You lose feeling (have numbness)  after an injury. Your burning or prickling feeling gets worse when you walk. You have pain or cramps. You feel  dizzy or you faint. You have a rash. Get help right away if: You feel weak or have new weakness in an arm or leg. You have trouble walking or moving. You have problems speaking, understanding, or seeing. You feel confused. You cannot control when you pee (urinate) or poop (have a bowel movement). These symptoms may be an emergency. Get help right away. Call 911. Do not wait to see if the symptoms will go away. Do not drive yourself to the hospital. Summary Paresthesia is a burning or prickling feeling. It often happens in the hands, arms, legs, or feet. In most cases, the feeling goes away in a short time and is not a sign of a serious problem. If you have paresthesia that lasts a long time, you need to be seen by your doctor. This information is not intended to replace advice given to you by your health care provider. Make sure you discuss any questions you have with your health care provider. Document Revised: 04/29/2021 Document Reviewed: 04/29/2021 Elsevier Patient Education  2022 [place], MD [facility]

## 2021-10-30 NOTE — Assessment & Plan Note (Signed)
Stable.  No palpable left axillary lymph nodes.  Relationship to present symptoms uncertain at this time. ?

## 2021-10-30 NOTE — Patient Instructions (Signed)
Paresthesia ?Paresthesia is a burning or prickling feeling. This feeling can happen in any part of the body. It often happens in the hands, arms, legs, or feet. Usually, it is not painful. In most cases, the feeling goes away in a short time and is not a sign of a serious problem. If you have paresthesia that lasts a long time, you need to see your doctor. ?Follow these instructions at home: ?Nutrition ?Eat a healthy diet. This includes: ?Eating foods that are high in fiber. These include beans, whole grains, and fresh fruits and vegetables. ?Limiting foods that are high in fat and sugar. These include fried or sweet foods. ? ?Alcohol use ? ?Do not drink alcohol if: ?Your doctor tells you not to drink. ?You are pregnant, may be pregnant, or are planning to become pregnant. ?If you drink alcohol: ?Limit how much you have to: ?0-1 drink a day for women. ?0-2 drinks a day for men. ?Know how much alcohol is in your drink. In the [place] one drink equals one 12 oz bottle of beer (355 mL), one 5 oz glass of wine (148 mL), or one 1? oz glass of hard liquor (44 mL). ?General instructions ?Take over-the-counter and prescription medicines only as told by your doctor. ?Do not smoke or use any products that contain nicotine or tobacco. If you need help quitting, ask your doctor. ?If you have diabetes, work with your doctor to make sure your blood sugar stays in a healthy range. ?If your feet feel numb: ?Check for redness, warmth, and swelling every day. ?Wear padded socks and comfortable shoes. These help protect your feet. ?Keep all follow-up visits. ?Contact a doctor if: ?You have paresthesia that gets worse or does not go away. ?You lose feeling (have numbness) after an injury. ?Your burning or prickling feeling gets worse when you walk. ?You have pain or cramps. ?You feel dizzy or you faint. ?You have a rash. ?Get help right away if: ?You feel weak or have new weakness in an arm or leg. ?You have trouble walking or  moving. ?You have problems speaking, understanding, or seeing. ?You feel confused. ?You cannot control when you pee (urinate) or poop (have a bowel movement). ?These symptoms may be an emergency. Get help right away. Call 911. ?Do not wait to see if the symptoms will go away. ?Do not drive yourself to the hospital. ?Summary ?Paresthesia is a burning or prickling feeling. It often happens in the hands, arms, legs, or feet. ?In most cases, the feeling goes away in a short time and is not a sign of a serious problem. ?If you have paresthesia that lasts a long time, you need to be seen by your doctor. ?This information is not intended to replace advice given to you by your health care provider. Make sure you discuss any questions you have with your health care provider. ?Document Revised: 04/29/2021 Document Reviewed: 04/29/2021 ?Elsevier Patient Education ? [place]. ? ?

## 2021-10-30 NOTE — Assessment & Plan Note (Signed)
Differential diagnosis discussed with patient.  Needs work-up. ?Blood work done today.  Suspected sleep apnea. ?

## 2021-10-30 NOTE — Assessment & Plan Note (Signed)
She may very likely have sleep apnea.  Needs work-up. ?Refer to [facility] for sleep studies and evaluation.  ?

## 2021-11-18 ENCOUNTER — Other Ambulatory Visit (HOSPITAL_COMMUNITY): Payer: Self-pay

## 2021-12-04 ENCOUNTER — Other Ambulatory Visit (HOSPITAL_COMMUNITY): Payer: Self-pay

## 2021-12-26 ENCOUNTER — Encounter: Payer: Self-pay | Admitting: Nurse Practitioner

## 2021-12-26 ENCOUNTER — Ambulatory Visit (INDEPENDENT_AMBULATORY_CARE_PROVIDER_SITE_OTHER): Payer: No Typology Code available for payment source | Admitting: Nurse Practitioner

## 2021-12-26 VITALS — BP 124/84 | Ht 66.0 in | Wt 253.0 lb

## 2021-12-26 DIAGNOSIS — Z13820 Encounter for screening for osteoporosis: Secondary | ICD-10-CM

## 2021-12-26 DIAGNOSIS — N951 Menopausal and female climacteric states: Secondary | ICD-10-CM | POA: Diagnosis not present

## 2021-12-26 DIAGNOSIS — Z01419 Encounter for gynecological examination (general) (routine) without abnormal findings: Secondary | ICD-10-CM

## 2021-12-26 DIAGNOSIS — Z853 Personal history of malignant neoplasm of breast: Secondary | ICD-10-CM

## 2021-12-26 DIAGNOSIS — Z79810 Long term (current) use of selective estrogen receptor modulators (SERMs): Secondary | ICD-10-CM

## 2021-12-26 NOTE — Progress Notes (Signed)
? ?  Erin Gallagher [facility] 05-Jun-1971 [id number] ? ? ?History:  51 y.o. G2P0011 presents for annual exam. Perimenopausal. LMP in November 2022. Normal pap history. 2020 ER+ left breast cancer managed with lumpectomy, radiation, and on second year of Tamoxifen. HTN managed  by PCP.  ? ?Gynecologic History ?Patient's last menstrual period was 05/29/2021 (approximate). ?  ?Contraception/Family planning: tubal ligation ?Sexually active: Yes ? ?Health Maintenance ?Last Pap: 12/03/2017. Results were: Normal, 5-year repeat ?Last mammogram: 06/19/2021. Results were: Normal ?Last colonoscopy: 08/05/2016. 5-year recall d/t family hx ?Last Dexa: Never ? ?Past medical history, past surgical history, family history and social history were all reviewed and documented in the EPIC chart. 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 59 yo son. Father with history of colon cancer at age 33.  ? ?ROS:  A ROS was performed and pertinent positives and negatives are included. ? ?Exam: ? ?Vitals:  ? 12/26/21 0934  ?BP: 124/84  ?Weight: 253 lb (114.8 kg)  ?Height: '5\' 6"'$  (1.676 m)  ? ?Body mass index is 40.84 kg/m?. ? ?General appearance:  Normal ?Thyroid:  Symmetrical, normal in size, without palpable masses or nodularity. ?Respiratory ? Auscultation:  Clear without wheezing or rhonchi ?Cardiovascular ? Auscultation:  Regular rate, without rubs, murmurs or gallops ? Edema/varicosities:  Not grossly evident ?Abdominal ? Soft,nontender, without masses, guarding or rebound. ? Liver/spleen:  No organomegaly noted ? Hernia:  None appreciated ? Skin ? Inspection:  Grossly normal ?Breasts: Examined lying and sitting.  ? Right: Without masses, retractions, nipple discharge or axillary adenopathy. ? ? Left: Without masses, retractions, nipple discharge or axillary adenopathy. ?Genitourinary  ? Inguinal/mons:  Normal without inguinal adenopathy ? External genitalia:  Normal appearing vulva with no masses, tenderness, or lesions ? BUS/Urethra/Skene's glands:   Normal ? Vagina:  Normal appearing with normal color and discharge, no lesions ? Cervix:  Normal appearing without discharge or lesions ? Uterus:  Normal in size, shape and contour.  Midline and mobile, nontender ? Adnexa/parametria:   ?  Rt: Normal in size, without masses or tenderness. ?  Lt: Normal in size, without masses or tenderness. ? Anus and perineum: Normal ? Digital rectal exam: Normal sphincter tone without palpated masses or tenderness ? ?Patient informed chaperone available to be present for breast and pelvic exam. Patient has requested no chaperone to be present. Patient has been advised what will be completed during breast and pelvic exam.  ? ?Assessment/Plan:  51 y.o. G2P0011 for annual exam.  ? ?Well female exam with routine gynecological exam - Education provided on SBEs, importance of preventative screenings, current guidelines, high calcium diet, regular exercise, and multivitamin daily.  Labs with PCP.  ? ?Perimenopausal - LMP 05/2021. Mild menopausal symptoms.  ? ?History of left breast cancer - 2020 ER+ managed with lumpectomy, radiation, and Tamoxifen (on year 2). Followed by oncology. UTD on mammogram. Normal breast exam today.  ? ?Screening for osteoporosis - Plan: DG Bone Density ? ?Long-term current use of tamoxifen - Plan: DG Bone Density ? ?Screening for cervical cancer - Normal Pap history.  Will repeat at 5-year interval per guidelines. ? ?Screening for colon cancer - 2017 colonoscopy. Overdue and encouraged to schedule. Father with history of colon cancer.  ? ?Return in 1 year for annual.  ? ? ? ?Tamela Gammon DNP, 10:06 AM 12/26/2021 ? ?

## 2021-12-30 ENCOUNTER — Telehealth: Payer: Self-pay | Admitting: Neurology

## 2021-12-30 ENCOUNTER — Institutional Professional Consult (permissible substitution): Payer: No Typology Code available for payment source | Admitting: Neurology

## 2022-01-09 ENCOUNTER — Encounter: Payer: Self-pay | Admitting: Family Medicine

## 2022-01-09 ENCOUNTER — Other Ambulatory Visit (HOSPITAL_COMMUNITY): Payer: Self-pay

## 2022-01-09 ENCOUNTER — Other Ambulatory Visit (HOSPITAL_COMMUNITY)
Admission: RE | Admit: 2022-01-09 | Discharge: 2022-01-09 | Disposition: A | Discharge status: Home / Self Care | Payer: No Typology Code available for payment source | Source: Ambulatory Visit | Attending: Family Medicine | Admitting: Family Medicine

## 2022-01-09 ENCOUNTER — Ambulatory Visit (INDEPENDENT_AMBULATORY_CARE_PROVIDER_SITE_OTHER): Payer: No Typology Code available for payment source | Admitting: Family Medicine

## 2022-01-09 VITALS — BP 136/86 | HR 90 | Temp 98.0°F | Ht 66.0 in | Wt 254.0 lb

## 2022-01-09 DIAGNOSIS — L989 Disorder of the skin and subcutaneous tissue, unspecified: Secondary | ICD-10-CM

## 2022-01-09 DIAGNOSIS — M310 Hypersensitivity angiitis: Secondary | ICD-10-CM | POA: Diagnosis present

## 2022-01-09 DIAGNOSIS — D485 Neoplasm of uncertain behavior of skin: Secondary | ICD-10-CM | POA: Diagnosis not present

## 2022-01-09 MED ORDER — COLCHICINE 0.6 MG PO TABS
0.6000 mg | ORAL_TABLET | Freq: Two times a day (BID) | ORAL | 0 refills | Status: DC
  Filled 2022-01-09: qty 56, 28d supply, fill #0

## 2022-01-09 NOTE — Patient Instructions (Addendum)
Take the colchicine as prescribed. ? ?Skin Biopsy, Care After ?The following information offers guidance on how to care for yourself after your procedure. Your health care provider may also give you more specific instructions. If you have problems or questions, contact your health care provider. ?What can I expect after the procedure? ?After the procedure, it is common to have: ?Soreness or mild pain. ?Bruising. ?Itching. ?Some redness and swelling. ?Follow these instructions at home: ?Biopsy site care ? ?Follow instructions from your health care provider about how to take care of your biopsy site. Make sure you: ?Wash your hands with soap and water for at least 20 seconds before and after you change your bandage (dressing). If soap and water are not available, use hand sanitizer. ?Change your dressing as told by your health care provider. ?Leave stitches (sutures), skin glue, or adhesive strips in place. These skin closures may need to stay in place for 2 weeks or longer. If adhesive strip edges start to loosen and curl up, you may trim the loose edges. Do not remove adhesive strips completely unless your health care provider tells you to do that. ?Check your biopsy site every day for signs of infection. Check for: ?More redness, swelling, or pain. ?Fluid or blood. ?Warmth. ?Pus or a bad smell. ?Do not take baths, swim, or use a hot tub until your health care provider approves. Ask your health care provider if you may take showers. You may only be allowed to take sponge baths. ?General instructions ?Take over-the-counter and prescription medicines only as told by your health care provider. ?Return to your normal activities as told by your health care provider. Ask your health care provider what activities are safe for you. ?Keep all follow-up visits. This is important. ?Contact a health care provider if: ?You have more redness, swelling, or pain around your biopsy site. ?You have fluid or blood coming from your  biopsy site. ?Your biopsy site feels warm to the touch. ?You have pus or a bad smell coming from your biopsy site. ?You have a fever. ?Your sutures, skin glue, or adhesive strips loosen or come off sooner than expected. ?Get help right away if: ?You have bleeding that does not stop with pressure or a dressing. ?Summary ?After the procedure, it is common to have soreness, bruising, and itching at the site. ?Follow instructions from your health care provider about how to take care of your biopsy site. ?Check your biopsy site every day for signs of infection. ?Contact a health care provider if you have more redness, swelling, or pain around your biopsy site, or your biopsy site feels warm to the touch. ?Keep all follow-up visits. This is important. ?This information is not intended to replace advice given to you by your health care provider. Make sure you discuss any questions you have with your health care provider. ?Document Revised: 03/19/2021 Document Reviewed: 03/19/2021 ?Elsevier Patient Education ? [place]. ? ?

## 2022-01-09 NOTE — Progress Notes (Signed)
? ?Subjective:  ? ? ? Patient ID: Erin Gallagher, female    DOB: 1971-04-25, 51 y.o.   MRN: [id number] ? ?Chief Complaint  ?Patient presents with  ? Rash  ?  Located on entirety of both legs first noticed 2 weeks ago. No known cause  ? ? ?HPI ?Patient is in today for a 2-week history of a somewhat pruritic rash of her legs.  No new medications, soaps, lotions, detergents, etc.  No changes.  Reports feeling fine.  Denies arthralgias or myalgias.  No fever, chills, nausea, vomiting or diarrhea.  No rash to any other part of her body. ? ?Health Maintenance Due  ?Topic Date Due  ? Zoster Vaccines- Shingrix (1 of 2) Never done  ? COLONOSCOPY (Pts 45-52yr Insurance coverage will need to be confirmed)  08/05/2021  ? ? ?Past Medical History:  ?Diagnosis Date  ? Arthritis   ? Blood transfusion without reported diagnosis   ? Cancer ([facility]   ? left breast cancer  ? Family history of breast cancer   ? Family history of colon cancer   ? Family history of lung cancer   ? Family history of throat cancer   ? Gall bladder disease   ? gall bladder removal  ? Hypertension   ? Personal history of radiation therapy   ? ? ?Past Surgical History:  ?Procedure Laterality Date  ? BREAST BIOPSY Left 05/16/2019  ? BREAST LUMPECTOMY Left 06/14/2019  ? BREAST LUMPECTOMY WITH RADIOACTIVE SEED AND SENTINEL LYMPH NODE BIOPSY Left 06/14/2019  ? Procedure: LEFT BREAST LUMPECTOMY WITH RADIOACTIVE SEED AND SENTINEL LYMPH NODE BIOPSY;  Surgeon: BStark Klein MD;  [facility]  Service: General;  Laterality: Left;  ? CESAREAN SECTION    ? 1997  ? CHOLECYSTECTOMY    ? TUBAL LIGATION    ? 1998  ? ? ?Family History  ?Problem Relation Age of Onset  ? Hypertension Mother   ? Hypertension Father   ? Colon cancer Father   ?     dx 647s ? Stroke Maternal Grandmother   ? Hypertension Maternal Grandmother   ? Aneurysm Maternal Grandmother   ? Throat cancer Maternal Uncle   ? Lung cancer Paternal Uncle   ? ? ?Social History  ? ?Socioeconomic  History  ? Marital status: Married  ?  Spouse name: Not on file  ? Number of children: 1  ? Years of education: Not on file  ? Highest education level: Not on file  ?Occupational History  ? Occupation: SLibrarian, academic ?Tobacco Use  ? Smoking status: Never  ? Smokeless tobacco: Never  ?Vaping Use  ? Vaping Use: Never used  ?Substance and Sexual Activity  ? Alcohol use: Yes  ?  Alcohol/week: 2.0 standard drinks  ?  Types: 2 Standard drinks or equivalent per week  ?  Comment: occasionally  ? Drug use: No  ? Sexual activity: Yes  ?  Birth control/protection: Surgical  ?  Comment: Tubal ligation  ?Other Topics Concern  ? Not on file  ?Social History Narrative  ? Not on file  ? ?Social Determinants of Health  ? ?Financial Resource Strain: Not on file  ?Food Insecurity: Not on file  ?Transportation Needs: Not on file  ?Physical Activity: Not on file  ?Stress: Not on file  ?Social Connections: Not on file  ?Intimate Partner Violence: Not on file  ? ? ?Outpatient Medications Prior to Visit  ?Medication Sig Dispense Refill  ? amLODipine (NORVASC) 10 MG tablet  Take 1 tablet (10 mg total) by mouth daily. 90 tablet 3  ? cyclobenzaprine (FLEXERIL) 10 MG tablet Take 1/2 to 1 tablet (5-10 mg total) by mouth 3 (three) times daily as needed for muscle spasms. 60 tablet 1  ? losartan-hydrochlorothiazide (HYZAAR) 50-12.5 MG tablet Take 1 tablet by mouth daily. 90 tablet 3  ? Multiple Vitamin (MULTIVITAMIN PO) Take by mouth.    ? tamoxifen (NOLVADEX) 20 MG tablet TAKE 1 TABLET BY MOUTH ONCE DAILY 90 tablet 3  ? VITAMIN D PO Take by mouth.    ? ?No facility-administered medications prior to visit.  ? ? ?Allergies  ?Allergen Reactions  ? Other Rash  ?  Steri Strips cause itching and a rash  ? ? ?ROS ?Pertinent positives and negatives in the history of present illness. ? ?   ?Objective:  ?  ?Physical Exam ? ?BP 136/86 (BP Location: Left Arm, Patient Position: Sitting, Cuff Size: Large)   Pulse 90   Temp 98 ?F (36.7 ?C) (Temporal)   Ht '5\' 6"'$   (1.676 m)   Wt 254 lb (115.2 kg)   SpO2 98%   BMI 41.00 kg/m?  ?Wt Readings from Last 3 Encounters:  ?01/09/22 254 lb (115.2 kg)  ?12/26/21 253 lb (114.8 kg)  ?10/30/21 252 lb (114.3 kg)  ? ? ? ?Bilateral lower extremities from mid thigh to her toes with a petechiae type rash.  The rash is slightly raised in areas and nonblanchable. ? ? ?After informed verbal consent was obtained. Using Betadine for cleansing and 2% Lidocaine with epinephrine for anesthetic. With sterile technique a 4 mm punch biopsy was used to obtain a biopsy specimen of a lesion over the left mid lateral thigh. Hemostasis was obtained by pressure and the wound was sutured with one interrupted suture 4.0 nylon. Antibiotic dressing is applied. The specimen is labeled and sent to pathology for evaluation. The procedure was well tolerated without complications.  ? ?Scarlette Calico, MD  ?   ?Assessment & Plan:  ? ?Problem List Items Addressed This Visit   ?None ?Visit Diagnoses   ? ? Leukocytoclastic vasculitis ([place])    -  Primary  ? Relevant Medications  ? colchicine 0.6 MG tablet  ? Other Relevant Orders  ? Cyclic citrul peptide antibody, IgG  ? C-reactive protein  ? CBC with Differential/Platelet  ? Comprehensive metabolic panel  ? Protime-INR  ? APTT  ? Surgical pathology( [place]/ [facility])  ? Skin lesion of left leg      ? Relevant Orders  ? Surgical pathology( [place]/ [facility])  ? ?  ? ?Biopsy done as stated above.  I assisted Dr. Ronnald Ramp with the procedure.  Patient tolerated it well. Colchicine prescribed.  We will check labs including coag studies and follow-up.  She will return in 7 to 10 days for follow-up and suture removal or sooner if needed. ? ?I am having Marycatherine D. Feggins "VAL" start on colchicine. I am also having her maintain her VITAMIN D PO, Multiple Vitamin (MULTIVITAMIN PO), amLODipine, losartan-hydrochlorothiazide, tamoxifen, and cyclobenzaprine. ? ?Meds ordered this encounter  ?Medications  ? colchicine 0.6 MG tablet   ?  Sig: Take 1 tablet (0.6 mg total) by mouth 2 (two) times daily.  ?  Dispense:  56 tablet  ?  Refill:  0  ?  Order Specific Question:   Supervising Provider  ?  Answer:   Pricilla Holm A [[id number]]  ? ? ?

## 2022-01-15 LAB — SURGICAL PATHOLOGY

## 2022-01-17 ENCOUNTER — Encounter: Payer: Self-pay | Admitting: Family Medicine

## 2022-01-17 ENCOUNTER — Ambulatory Visit (INDEPENDENT_AMBULATORY_CARE_PROVIDER_SITE_OTHER): Payer: No Typology Code available for payment source | Admitting: Family Medicine

## 2022-01-17 VITALS — BP 110/76 | HR 82 | Temp 97.0°F | Ht 66.0 in | Wt 250.0 lb

## 2022-01-17 DIAGNOSIS — M310 Hypersensitivity angiitis: Secondary | ICD-10-CM | POA: Diagnosis not present

## 2022-01-17 DIAGNOSIS — L989 Disorder of the skin and subcutaneous tissue, unspecified: Secondary | ICD-10-CM

## 2022-01-17 DIAGNOSIS — Z4802 Encounter for removal of sutures: Secondary | ICD-10-CM | POA: Diagnosis not present

## 2022-01-17 LAB — CBC WITH DIFFERENTIAL/PLATELET
Basophils Absolute: 0 10*3/uL (ref 0.0–0.1)
Basophils Relative: 0.6 % (ref 0.0–3.0)
Eosinophils Absolute: 0.1 10*3/uL (ref 0.0–0.7)
Eosinophils Relative: 1.8 % (ref 0.0–5.0)
HCT: 41.8 % (ref 36.0–46.0)
Hemoglobin: 13.6 g/dL (ref 12.0–15.0)
Lymphocytes Relative: 23.7 % (ref 12.0–46.0)
Lymphs Abs: 1.5 10*3/uL (ref 0.7–4.0)
MCHC: 32.6 g/dL (ref 30.0–36.0)
MCV: 82.7 fl (ref 78.0–100.0)
Monocytes Absolute: 0.4 10*3/uL (ref 0.1–1.0)
Monocytes Relative: 6.7 % (ref 3.0–12.0)
Neutro Abs: 4.2 10*3/uL (ref 1.4–7.7)
Neutrophils Relative %: 67.2 % (ref 43.0–77.0)
Platelets: 278 10*3/uL (ref 150.0–400.0)
RBC: 5.05 Mil/uL (ref 3.87–5.11)
RDW: 13.6 % (ref 11.5–15.5)
WBC: 6.3 10*3/uL (ref 4.0–10.5)

## 2022-01-17 LAB — COMPREHENSIVE METABOLIC PANEL
ALT: 21 U/L (ref 0–35)
AST: 18 U/L (ref 0–37)
Albumin: 4.2 g/dL (ref 3.5–5.2)
Alkaline Phosphatase: 53 U/L (ref 39–117)
BUN: 14 mg/dL (ref 6–23)
CO2: 26 mEq/L (ref 19–32)
Calcium: 9.2 mg/dL (ref 8.4–10.5)
Chloride: 104 mEq/L (ref 96–112)
Creatinine, Ser: 0.79 mg/dL (ref 0.40–1.20)
GFR: 86.63 mL/min (ref >60.00)
Glucose, Bld: 114 mg/dL — ABNORMAL HIGH (ref 70–99)
Potassium: 4.4 mEq/L (ref 3.5–5.1)
Sodium: 139 mEq/L (ref 135–145)
Total Bilirubin: 0.4 mg/dL (ref 0.2–1.2)
Total Protein: 7.3 g/dL (ref 6.0–8.3)

## 2022-01-17 LAB — C-REACTIVE PROTEIN: CRP: <1 mg/dL (ref 0.5–20.0)

## 2022-01-17 LAB — PROTIME-INR
INR: 1.1 ratio — ABNORMAL HIGH (ref 0.8–1.0)
Prothrombin Time: 11.6 s (ref 9.6–13.1)

## 2022-01-17 LAB — URINALYSIS, ROUTINE W REFLEX MICROSCOPIC
Bilirubin Urine: NEGATIVE
Ketones, ur: NEGATIVE
Leukocytes,Ua: NEGATIVE
Nitrite: NEGATIVE
Specific Gravity, Urine: 1.025 (ref 1.000–1.030)
Total Protein, Urine: NEGATIVE
Urine Glucose: NEGATIVE
Urobilinogen, UA: 0.2 (ref 0.0–1.0)
pH: 5.5 (ref 5.0–8.0)

## 2022-01-17 LAB — APTT: aPTT: 26.2 s (ref 25.4–36.8)

## 2022-01-17 NOTE — Progress Notes (Signed)
Subjective:     Patient ID: Erin Gallagher, female    DOB: [birth year], 51 y.o.   MRN: [id number]  Chief Complaint  Patient presents with   Follow-up    Believes rash is clearing up, needs to get stitch from leg removed    HPI Patient is in today for suture removal, follow up on rash and to have labs done.  Probable diagnosis LVC and labs recommended to look for autoimmune condition such as lupus.   States rash has improved somewhat.   Denies fever, chills, night sweats, dizziness, chest pain, palpitations, shortness of breath, abdominal pain, N/V/D, urinary symptoms, LE edema.    Health Maintenance Due  Topic Date Due   Zoster Vaccines- Shingrix (1 of 2) Never done   COLONOSCOPY (Pts 45-9yr Insurance coverage will need to be confirmed)  08/05/2021    Past Medical History:  Diagnosis Date   Arthritis    Blood transfusion without reported diagnosis    [place]    [place]   Family history of breast cancer    Family history of colon cancer    Family history of lung cancer    Family history of throat cancer    Gall bladder disease    gall bladder removal   Hypertension    Personal history of radiation therapy     Past Surgical History:  Procedure Laterality Date   BREAST BIOPSY Left 05/16/2019   BREAST LUMPECTOMY Left 06/14/2019   BREAST LUMPECTOMY WITH RADIOACTIVE SEED AND SENTINEL LYMPH NODE BIOPSY Left 06/14/2019   Procedure: LEFT BREAST LUMPECTOMY WITH RADIOACTIVE SEED AND SENTINEL LYMPH NODE BIOPSY;  Surgeon: BStark Klein MD;  [facility] [facility]  Service: General;  [place]: Left;   [place]   Family History  Problem Relation Age of Onset   Hypertension Mother    Hypertension Father    Colon cancer Father        dx 653s  Stroke Maternal Grandmother    Hypertension Maternal Grandmother    Aneurysm Maternal Grandmother    Throat cancer Maternal Uncle     Lung cancer Paternal Uncle     Social History   Socioeconomic History   Marital status: Married    Spouse name: Not on file   Number of children: 1   Years of education: Not on file   Highest education level: Not on file  Occupational History   Occupation: SLibrarian, academic Tobacco Use   Smoking status: Never   Smokeless tobacco: Never  Vaping Use   Vaping Use: Never used  Substance and Sexual Activity   Alcohol use: Yes    Alcohol/week: 2.0 standard drinks    Types: 2 Standard drinks or equivalent per week    Comment: occasionally   Drug use: No   Sexual activity: Yes    Birth control/protection: Surgical    Comment: Tubal ligation  Other Topics Concern   Not on file  Social History Narrative   Not on file   Social Determinants of Health   Financial Resource Strain: Not on file  Food Insecurity: Not on file  Transportation Needs: Not on file  Physical Activity: Not on file  Stress: Not on file  Social Connections: Not on file  Intimate Partner Violence: Not on file    Outpatient Medications Prior to Visit  Medication Sig Dispense Refill  amLODipine (NORVASC) 10 MG tablet Take 1 tablet (10 mg total) by mouth daily. 90 tablet 3   colchicine 0.6 MG tablet Take 1 tablet (0.6 mg total) by mouth 2 (two) times daily. 56 tablet 0   cyclobenzaprine (FLEXERIL) 10 MG tablet Take 1/2 to 1 tablet (5-10 mg total) by mouth 3 (three) times daily as needed for muscle spasms. 60 tablet 1   losartan-hydrochlorothiazide (HYZAAR) 50-12.5 MG tablet Take 1 tablet by mouth daily. 90 tablet 3   tamoxifen (NOLVADEX) 20 MG tablet TAKE 1 TABLET BY MOUTH ONCE DAILY 90 tablet 3   VITAMIN D PO Take by mouth.     Multiple Vitamin (MULTIVITAMIN PO) Take by mouth.     No facility-administered medications prior to visit.    Allergies  Allergen Reactions   Other Rash    Steri Strips cause itching and a rash    ROS Pertinent positives and negatives in the history of present illness.      Objective:    Physical Exam Constitutional:      General: She is not in acute distress.    Appearance: She is not ill-appearing.  Cardiovascular:     Rate and Rhythm: Normal rate.  Pulmonary:     Effort: Pulmonary effort is normal.  Musculoskeletal:        General: Normal range of motion.  Skin:    General: Skin is warm and dry.     Findings: Rash present.     Comments: Improved petechial rash on bilateral lower extremities.  Area with most involvement is posterior left knee. Obtained verbal consent for suture removal. Single interrupted stitch removed from posterior left thigh without bleeding or any complications.  Sterile tweezers and sterile scissors were used.  Area well-healed with scabbing.  No sign of infection.  Neurological:     General: No focal deficit present.     Mental Status: She is alert and oriented to person, place, and time.  Psychiatric:        Mood and Affect: Mood normal.        Thought Content: Thought content normal.    BP 110/76 (BP Location: Right Arm, Patient Position: Sitting, Cuff Size: Large)   Pulse 82   Temp (!) 97 F (36.1 C) (Temporal)   Ht '5\' 6"'$  (1.676 m)   Wt 250 lb (113.4 kg)   SpO2 99%   BMI 40.35 kg/m  Wt Readings from Last 3 Encounters:  01/17/22 250 lb (113.4 kg)  01/09/22 254 lb (115.2 kg)  12/26/21 253 lb (114.8 kg)        Assessment & Plan:   Problem List Items Addressed This Visit   None Visit Diagnoses     Leukocytoclastic vasculitis ([place])    -  Primary   Relevant Orders   ANA   Hepatitis C antibody   Hepatitis B surface antibody,qualitative   Hepatitis B surface antigen   HIV Antibody (routine testing w rflx)   Urinalysis, Routine w reflex microscopic (Completed)   Rheumatoid factor   APTT (Completed)   Protime-INR   Skin lesion of left leg       Relevant Orders   ANA   Hepatitis C antibody   Hepatitis B surface antibody,qualitative   Hepatitis B surface antigen   HIV Antibody (routine testing w rflx)    Urinalysis, Routine w reflex microscopic (Completed)   Rheumatoid factor   Visit for suture removal          Reviewed biopsy results with patient.  Biopsy report states rash is suspicious for early lupus.  Probable LCV. Recommended Labs ordered.  UA ordered. She is reportedly in her usual state of health.  Rash has improved slightly but still present.  No new symptoms. Follow-up pending lab results.  She is aware that I may refer her to rheumatology for further evaluation  I have discontinued Golden Hurter. Depoy "VAL"'s Multiple Vitamin (MULTIVITAMIN PO). I am also having her maintain her VITAMIN D PO, amLODipine, losartan-hydrochlorothiazide, tamoxifen, cyclobenzaprine, and colchicine.  No orders of the defined types were placed in this encounter.

## 2022-01-20 ENCOUNTER — Encounter: Payer: Self-pay | Admitting: Family Medicine

## 2022-01-20 ENCOUNTER — Other Ambulatory Visit: Payer: Self-pay | Admitting: Family Medicine

## 2022-01-20 DIAGNOSIS — M310 Hypersensitivity angiitis: Secondary | ICD-10-CM

## 2022-01-20 DIAGNOSIS — R768 Other specified abnormal immunological findings in serum: Secondary | ICD-10-CM

## 2022-01-20 HISTORY — DX: Other specified abnormal immunological findings in serum: R76.8

## 2022-01-20 HISTORY — DX: Hypersensitivity angiitis: M31.0

## 2022-01-20 LAB — ANTI-NUCLEAR AB-TITER (ANA TITER): ANA Titer 1: 1:40 {titer} — ABNORMAL HIGH

## 2022-01-20 LAB — HEPATITIS C ANTIBODY
Hepatitis C Ab: NONREACTIVE
SIGNAL TO CUT-OFF: 0.2 (ref <1.00)

## 2022-01-20 LAB — HEPATITIS B SURFACE ANTIBODY,QUALITATIVE: Hep B S Ab: REACTIVE — AB

## 2022-01-20 LAB — ANA: Anti Nuclear Antibody (ANA): POSITIVE — AB

## 2022-01-20 LAB — HEPATITIS B SURFACE ANTIGEN: Hepatitis B Surface Ag: NONREACTIVE

## 2022-01-20 LAB — CYCLIC CITRUL PEPTIDE ANTIBODY, IGG: Cyclic Citrullin Peptide Ab: <16 UNITS

## 2022-01-20 LAB — HIV ANTIBODY (ROUTINE TESTING W REFLEX): HIV 1&2 Ab, 4th Generation: NONREACTIVE

## 2022-01-20 LAB — RHEUMATOID FACTOR: Rheumatoid fact SerPl-aCnc: <14 IU/mL (ref <14)

## 2022-01-20 NOTE — Progress Notes (Signed)
Please reach out to her in case she does not read her mychart note by Tuesday. She will be getting a call from rheumatology for positive ANA. This is in my mychart note as well as the fact that she had blood in her urine. Please review my note prior to contacting her.

## 2022-01-20 NOTE — Telephone Encounter (Signed)
Cxld appt provider out

## 2022-01-21 ENCOUNTER — Encounter: Payer: Self-pay | Admitting: Emergency Medicine

## 2022-01-21 DIAGNOSIS — Z1211 Encounter for screening for malignant neoplasm of colon: Secondary | ICD-10-CM

## 2022-01-21 NOTE — Telephone Encounter (Signed)
Please refer to GI Dr. Eulogio Bear for colonoscopy.  Thanks.

## 2022-01-29 ENCOUNTER — Ambulatory Visit (INDEPENDENT_AMBULATORY_CARE_PROVIDER_SITE_OTHER): Payer: No Typology Code available for payment source

## 2022-01-29 ENCOUNTER — Other Ambulatory Visit: Payer: Self-pay | Admitting: Nurse Practitioner

## 2022-01-29 DIAGNOSIS — N951 Menopausal and female climacteric states: Secondary | ICD-10-CM

## 2022-01-29 DIAGNOSIS — Z79810 Long term (current) use of selective estrogen receptor modulators (SERMs): Secondary | ICD-10-CM | POA: Diagnosis not present

## 2022-01-29 DIAGNOSIS — Z13820 Encounter for screening for osteoporosis: Secondary | ICD-10-CM | POA: Diagnosis not present

## 2022-02-08 ENCOUNTER — Other Ambulatory Visit (HOSPITAL_COMMUNITY): Payer: Self-pay

## 2022-02-18 ENCOUNTER — Encounter: Payer: Self-pay | Admitting: Neurology

## 2022-02-18 ENCOUNTER — Ambulatory Visit: Payer: No Typology Code available for payment source | Admitting: Neurology

## 2022-02-18 VITALS — BP 134/90 | HR 80 | Ht 66.0 in | Wt 255.2 lb

## 2022-02-18 DIAGNOSIS — G4719 Other hypersomnia: Secondary | ICD-10-CM

## 2022-02-18 DIAGNOSIS — Z9189 Other specified personal risk factors, not elsewhere classified: Secondary | ICD-10-CM

## 2022-02-18 DIAGNOSIS — R351 Nocturia: Secondary | ICD-10-CM

## 2022-02-18 DIAGNOSIS — R0683 Snoring: Secondary | ICD-10-CM | POA: Diagnosis not present

## 2022-02-18 DIAGNOSIS — R519 Headache, unspecified: Secondary | ICD-10-CM | POA: Diagnosis not present

## 2022-02-18 DIAGNOSIS — G478 Other sleep disorders: Secondary | ICD-10-CM

## 2022-02-18 DIAGNOSIS — G4700 Insomnia, unspecified: Secondary | ICD-10-CM

## 2022-02-18 NOTE — Progress Notes (Signed)
Subjective:    Patient ID: Erin Gallagher is a 51 y.o. female.  HPI    [name], MD, PhD [facility] [address] [place], [place] [address]  Dear Dr. Mitchel Honour,  I saw your patient, Erin Gallagher, upon your kind request in my sleep clinic today for initial consultation of her sleep disorder, in particular, concern for underlying obstructive sleep apnea.  The patient is unaccompanied today.  As you know, Erin Gallagher is a 51 year old right-handed woman with an underlying medical history of arthritis, ANA positive status, history of vasculitis, breast cancer with status post lumpectomy, hypertension, and severe obesity with a BMI of over 40, who reports snoring and excessive daytime somnolence.  I reviewed your office note from 10/30/2021.  Her Epworth sleepiness score is 12/24, fatigue severity score is 53/63.  She does not have any family history of sleep apnea, her father has oxygen at night.  Patient has had difficulty initiating and maintaining sleep for years.  She has trouble relaxing at night, she tried over-the-counter melatonin, up to 5 to 10 mg at night without any significant improvement.  She lives with her husband, she has 1 grown biological child and 1 stepchild.  She works as an Teaching laboratory technician.  She is a non-smoker and drinks alcohol on the weekend typically.  She drinks caffeine in the form of tea or coffee, about 1 serving per day on average, no daily coffee or tea or soda but averages about 1 serving.  She has nocturia about twice per average night, she typically takes her blood pressure medication with the hydrochlorothiazide in the evening along with her amlodipine.  She has occasional morning headaches which are not migraine-like, more dull, achy, nagging.  She does take occasional over-the-counter Advil for these.  She has a TV in her bedroom but it is typically not on at night.   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   Personal history of radiation therapy     Her Past Surgical History Is Significant For: Past Surgical History:  Procedure Laterality Date   BREAST BIOPSY Left 05/16/2019   BREAST LUMPECTOMY Left 06/14/2019   BREAST LUMPECTOMY WITH RADIOACTIVE SEED AND SENTINEL LYMPH NODE BIOPSY Left 06/14/2019   Procedure: LEFT BREAST LUMPECTOMY WITH RADIOACTIVE SEED AND SENTINEL LYMPH NODE BIOPSY;  Surgeon: [name], MD;  Location: [facility];  Service: General;  Laterality: Left;   [place]    Her Family History Is Significant For: Family History  Problem Relation Age of Onset   Hypertension Mother    Hypertension Father    Colon cancer Father        dx 87s   Stroke Maternal Grandmother    Hypertension Maternal Grandmother    Aneurysm Maternal Grandmother    Throat cancer Maternal Uncle    Lung cancer Paternal Uncle  Her Social History Is Significant For: Social History   Socioeconomic History   Marital status: Married    Spouse name: Not on file   Number of children: 1   Years of education: Not on file   Highest education level: Not on file  Occupational History   Occupation: Supervisor  Tobacco Use   Smoking status: Never   Smokeless tobacco: Never  Vaping Use   Vaping Use: Never used  Substance and Sexual Activity   Alcohol use: Yes    Alcohol/week: 2.0 standard drinks of alcohol    Types: 2 Standard drinks or equivalent per week    Comment: occasionally   Drug use: No   Sexual  activity: Yes    Birth control/protection: Surgical    Comment: Tubal ligation  Other Topics Concern   Not on file  Social History Narrative   Caffiene 1 cup coffee daily.    Lives home with spouse   Works at    Education 95yr   Children one.    Social Determinants of Health   Financial Resource Strain: Not on file  Food Insecurity: Not on file  Transportation Needs: Not on file  Physical Activity: Sufficiently Active (12/03/2017)   Exercise Vital Sign    Days of Exercise per Week: 3 days    Minutes of Exercise per Session: 50 min  Stress: Not on file  Social Connections: Not on file    Her Allergies Are:  Allergies  Allergen Reactions   Other Rash    Steri Strips cause itching and a rash  :   Her Current Medications Are:  Outpatient Encounter Medications as of 02/18/2022  Medication Sig   amLODipine (NORVASC) 10 MG tablet Take 1 tablet (10 mg total) by mouth daily.   cyclobenzaprine (FLEXERIL) 10 MG tablet Take 1/2 to 1 tablet (5-10 mg total) by mouth 3 (three) times daily as needed for muscle spasms.   losartan-hydrochlorothiazide (HYZAAR) 50-12.5 MG tablet Take 1 tablet by mouth daily.   tamoxifen (NOLVADEX) 20 MG tablet TAKE 1 TABLET BY MOUTH ONCE DAILY   VITAMIN D PO Take 1,000 Units by mouth daily.   [DISCONTINUED] colchicine 0.6 MG tablet Take 1 tablet (0.6 mg total) by mouth 2 (two) times daily.   No facility-administered encounter medications on file as of 02/18/2022.  :   Review of Systems:  Out of a complete 14 point review of systems, all are reviewed and negative with the exception of these symptoms as listed below:  Review of Systems  Neurological:        Snores, Wakes up tired, Average sleep about 5 hours, Daytime somnolence. ESS  12 FSS 53.    Objective:  Neurological Exam  Physical Exam Physical Examination:   Vitals:   02/18/22 1101  BP: 134/90  Pulse: 80    General Examination: The patient is a very pleasant 51y.o. female  in no acute distress. She appears well-developed and well-nourished and well groomed.   HEENT: Normocephalic, atraumatic, pupils are equal, round and reactive to light, extraocular tracking is good without limitation to gaze excursion or nystagmus noted. Hearing is grossly intact. Face is symmetric with normal facial animation. Speech is clear with no dysarthria noted. There is no hypophonia. There is no lip, neck/head, jaw or voice tremor. Neck is supple with full range of passive and active motion. There are no carotid bruits on auscultation. Oropharynx exam reveals: mild mouth dryness, good dental hygiene and mild airway crowding, due to small airway  entry, Mallampati class III, tonsillar size on the smaller side.  Tongue protrudes centrally and palate elevates symmetrically, neck circumference of 15 inches, minimal overbite noted.  Chest: Clear to auscultation without wheezing, rhonchi or crackles noted.  Heart: S1+S2+0, regular and normal without murmurs, rubs or gallops noted.   Abdomen: Soft, non-tender and non-distended.  Extremities: There is 1+ edema in the right ankle, trace edema in the left ankle.   Skin: Warm and dry without trophic changes noted.   Musculoskeletal: exam reveals no obvious joint deformities.   Neurologically:  Mental status: The patient is awake, alert and oriented in all 4 spheres. Her immediate and remote memory, attention, language skills and fund of knowledge are appropriate. There is no evidence of aphasia, agnosia, apraxia or anomia. Speech is clear with normal prosody and enunciation. Thought process is linear. Mood is normal and affect is normal.  Cranial nerves II - XII are as described above under HEENT exam.  Motor exam: Normal bulk, strength and tone is noted. There is no obvious tremor. Fine motor skills and coordination: grossly intact.  Cerebellar testing: No dysmetria or intention tremor. There is no truncal or gait ataxia.  Sensory exam: intact to  light touch in the upper and lower extremities.  Gait, station and balance: She stands easily. No veering to one side is noted. No leaning to one side is noted. Posture is age-appropriate and stance is narrow based. Gait shows normal stride length and normal pace. No problems turning are noted.   Assessment and Plan:  In summary, Erin Gallagher is a very pleasant 51 y.o.-year old female with an underlying medical history of arthritis, ANA positive status, history of vasculitis, breast cancer with status post lumpectomy, hypertension, and severe obesity with a BMI of over 40, whose history and physical exam are concerning for obstructive sleep apnea (OSA). I had a long chat with the patient about my findings and the diagnosis of OSA, its prognosis and treatment options. We talked about medical treatments, surgical interventions and non-pharmacological approaches. I explained in particular the risks and ramifications of untreated moderate to severe OSA, especially with respect to developing cardiovascular disease down the Road, including congestive heart failure, difficult to treat hypertension, cardiac arrhythmias, or stroke. Even type 2 diabetes has, in part, been linked to untreated OSA. Symptoms of untreated OSA include daytime sleepiness, memory problems, mood irritability and mood disorder such as depression and anxiety, lack of energy, as well as recurrent headaches, especially morning headaches. We talked about trying to maintain a healthy lifestyle in general, as well as the importance of weight control. We also talked about the importance of good sleep hygiene. I recommended the following at this time: sleep study.  I outlined the differences between a laboratory attended sleep study versus home sleep testing. I explained the sleep test procedure to the patient and also outlined possible surgical and non-surgical treatment options of OSA, including the use of a custom-made dental device (which would  require a referral to a specialist dentist or oral surgeon), upper airway surgical options, such as traditional UPPP or a novel less invasive surgical option in the form of Inspire hypoglossal nerve stimulation (which would involve a referral to an ENT surgeon). I also explained the CPAP treatment option to the patient, who indicated that she would be willing to try PAP therapy, if the need arises. I explained the importance of being compliant with PAP treatment, not only for insurance purposes but primarily to improve Her symptoms, and  for the patient's long term health benefit, including to reduce Her cardiovascular risks. I answered all her questions today and the patient was in agreement. I plan to see her back after the sleep study is completed and encouraged her to call with any interim questions, concerns, problems or updates.   Thank you very much for allowing me to participate in the care of this nice patient. If I can be of any further assistance to you please do not hesitate to call me at [phone].  Sincerely,   [name], MD, PhD

## 2022-02-18 NOTE — Patient Instructions (Signed)

## 2022-02-27 ENCOUNTER — Telehealth: Payer: Self-pay | Admitting: Neurology

## 2022-02-27 NOTE — Telephone Encounter (Signed)
LVM for pt to call back to schedule  [facility] no auth req ref # [id number]

## 2022-03-03 NOTE — Telephone Encounter (Signed)
x2 LVM for pt to call back to schedule

## 2022-03-06 ENCOUNTER — Other Ambulatory Visit: Payer: Self-pay

## 2022-03-24 ENCOUNTER — Other Ambulatory Visit (HOSPITAL_COMMUNITY): Payer: Self-pay

## 2022-03-25 ENCOUNTER — Ambulatory Visit (INDEPENDENT_AMBULATORY_CARE_PROVIDER_SITE_OTHER): Payer: No Typology Code available for payment source | Admitting: Neurology

## 2022-03-25 DIAGNOSIS — R519 Headache, unspecified: Secondary | ICD-10-CM

## 2022-03-25 DIAGNOSIS — Z9189 Other specified personal risk factors, not elsewhere classified: Secondary | ICD-10-CM

## 2022-03-25 DIAGNOSIS — G4733 Obstructive sleep apnea (adult) (pediatric): Secondary | ICD-10-CM

## 2022-03-25 DIAGNOSIS — R351 Nocturia: Secondary | ICD-10-CM

## 2022-03-25 DIAGNOSIS — R0683 Snoring: Secondary | ICD-10-CM | POA: Diagnosis not present

## 2022-03-25 DIAGNOSIS — G4700 Insomnia, unspecified: Secondary | ICD-10-CM

## 2022-03-25 DIAGNOSIS — G4761 Periodic limb movement disorder: Secondary | ICD-10-CM

## 2022-03-25 DIAGNOSIS — G478 Other sleep disorders: Secondary | ICD-10-CM

## 2022-03-25 DIAGNOSIS — G4719 Other hypersomnia: Secondary | ICD-10-CM

## 2022-03-25 DIAGNOSIS — G4720 Circadian rhythm sleep disorder, unspecified type: Secondary | ICD-10-CM

## 2022-03-27 NOTE — Procedures (Signed)
PATIENT'S NAME:  Erin Gallagher, Erin Gallagher DOB:      12-05-1970      MR#:    [id number]     DATE OF RECORDING: 03/25/2022 REFERRING M.D.:  [facility], MD Study Performed:   Baseline Polysomnogram HISTORY: 51 year old woman with a history of arthritis, ANA positive status, history of vasculitis, breast cancer with status post lumpectomy, hypertension, and severe obesity with a BMI of over 40, who reports snoring and excessive daytime somnolence. The patient endorsed the Epworth Sleepiness Scale at 12 points. The patient's weight 255 pounds with a height of 66 (inches), resulting in a BMI of 41.1 kg/m2. The patient's neck circumference measured 15 inches.  CURRENT MEDICATIONS: Norvasc, Flexeril, Hyzaar, Nolvadex   PROCEDURE:  This is a multichannel digital polysomnogram utilizing the Somnostar 11.2 system.  Electrodes and sensors were applied and monitored per AASM Specifications.   EEG, EOG, Chin and Limb EMG, were sampled at 200 Hz.  ECG, Snore and Nasal Pressure, Thermal Airflow, Respiratory Effort, CPAP Flow and Pressure, Oximetry was sampled at 50 Hz. Digital video and audio were recorded.      BASELINE STUDY  Lights Out was at 20:30 and Lights On at 05:01.  Total recording time (TRT) was 512 minutes, with a total sleep time (TST) of 363 minutes.   The patient's sleep latency was 58 minutes.  REM latency was 211.5 minutes, which is delayed. The sleep efficiency was 70.9 %.     SLEEP ARCHITECTURE: WASO (Wake after sleep onset) was 91 minutes with mild sleep fragmentation noted and 2 longer periods of wakefulness. There were 8.5 minutes in Stage N1, 288 minutes Stage N2, 3 minutes Stage N3 and 63.5 minutes in Stage REM.  The percentage of Stage N1 was 2.3%, Stage N2 was 79.3%, which is markedly increased, Stage N3 was .8% and Stage R (REM sleep) was 17.5%, which is reduced (mildly). The arousals were noted as: 25 were spontaneous, 5 were associated with PLMs, 7 were associated with respiratory  events.  RESPIRATORY ANALYSIS:  There were a total of 69 respiratory events:  10 obstructive apneas, 0 central apneas and 0 mixed apneas with a total of 10 apneas and an apnea index (AI) of 1.7 /hour. There were 59 hypopneas with a hypopnea index of 9.8 /hour. The patient also had 0 respiratory event related arousals (RERAs).      The total APNEA/HYPOPNEA INDEX (AHI) was 11.4/hour and the total RESPIRATORY DISTURBANCE INDEX was  11.4 /hour.  45 events occurred in REM sleep and 48 events in NREM. The REM AHI was  42.5 /hour, versus a non-REM AHI of 4.8. The patient spent 145.5 minutes of total sleep time in the supine position and 218 minutes in non-supine.. The supine AHI was 14.8 versus a non-supine AHI of 9.1.  OXYGEN SATURATION & C02:  The Wake baseline 02 saturation was 95%, with the lowest being 83%. Time spent below 89% saturation equaled 4 minutes.  PERIODIC LIMB MOVEMENTS: The patient had a total of 255 Periodic Limb Movements.  The Periodic Limb Movement (PLM) index was 42.1 and the PLM Arousal index was .8/hour.  Audio and video analysis did not show any abnormal or unusual movements, behaviors, phonations or vocalizations. The patient took 1 bathroom break. Mild to moderate snoring was noted. The EKG was in keeping with normal sinus rhythm (NSR).  Post-study, the patient indicated that sleep was the same as usual.   IMPRESSION:  Obstructive Sleep Apnea (OSA) Periodic Limb Movement Disorder (PLMD) Dysfunctions associated with  sleep stages or arousal from sleep  RECOMMENDATIONS:  This study demonstrates overall mild obstructive sleep apnea, severe in REM sleep with a total AHI of 11.4/hour, REM AHI of 42.5/hour, supine AHI of 14.8/hour and O2 nadir of 83%. Given the patient's medical history and sleep related complaints, treatment with positive airway pressure (PAP) is recommended; this can be achieved in the form of autoPAP. Alternatively, a full-night CPAP titration study would allow  optimization of therapy, if needed, down the road. Other treatment options may include avoidance of supine sleep position along with weight loss, or the use of an oral appliance in certain patients. Please note that untreated obstructive sleep apnea may carry additional perioperative morbidity. Patients with significant obstructive sleep apnea should receive perioperative PAP therapy and the surgeons and particularly the anesthesiologist should be informed of the diagnosis and the severity of the sleep disordered breathing. Moderate PLMs (periodic limb movements of sleep) were noted during this study without any significant arousals; clinical correlation is recommended. PLMs may improve with PAP therapy.  This study shows sleep fragmentation and abnormal sleep stage percentages; these are nonspecific findings and per se do not signify an intrinsic sleep disorder or a cause for the patient's sleep-related symptoms. Causes include (but are not limited to) the first night effect of the sleep study, circadian rhythm disturbances, medication effect or an underlying mood disorder or medical problem.  The patient should be cautioned not to drive, work at heights, or operate dangerous or heavy equipment when tired or sleepy. Review and reiteration of good sleep hygiene measures should be pursued with any patient. The patient will be seen in follow-up by Dr. Rexene Alberts at [facility] for discussion of the test results and further management strategies. The referring provider will be notified of the test results.  I certify that I have reviewed the entire raw data recording prior to the issuance of this report in accordance with the Standards of Accreditation of the American Academy of Sleep Medicine (AASM)  [name], MD, PhD Diplomat, American Board of Neurology and Sleep Medicine (Neurology and Sleep Medicine)

## 2022-03-27 NOTE — Addendum Note (Signed)
Addended by: [name] on: 03/27/2022 05:14 PM   Modules accepted: Orders

## 2022-03-31 ENCOUNTER — Telehealth: Payer: Self-pay | Admitting: *Deleted

## 2022-03-31 NOTE — Telephone Encounter (Signed)
I called the pt and LVM asking for call back. Left office number in message.

## 2022-03-31 NOTE — Telephone Encounter (Signed)
-----   Message from [name], MD sent at 03/27/2022  5:14 PM EDT ----- Patient referred by Dr. Mitchel Honour, seen by me on 02/18/22, diagnostic PSG on 03/25/22.    Please call and notify the patient that the recent sleep study did confirm the diagnosis of obstructive sleep apnea. OSA is overall mild, but worth treating to see if she feels better after treatment (ESS of 12/24). To that end, I recommend treatment in the form of autoPAP, which means, that we don't have to bring her back for a second sleep study with CPAP, but will let him try an autoPAP machine at home, through a DME company (of her choice, or as per insurance requirement). The DME representative will educate her on how to use the machine, how to put the mask on, etc. I have placed an order in the chart. Please send referral, talk to patient, send report to referring MD. We will need a FU in sleep clinic for 10 weeks post-PAP set up, please arrange that with me or one of our NPs. Thanks,   [name], MD, PhD [facility] ([place])  Marland Kitchen

## 2022-04-01 NOTE — Telephone Encounter (Signed)
Called pt & LVM asking for call back. Also advised a mychart message would be sent.

## 2022-04-03 NOTE — Telephone Encounter (Signed)
Order sent to [facility] for autopap.

## 2022-04-07 NOTE — Telephone Encounter (Signed)
[facility] confirmed receipt of order.  

## 2022-04-09 ENCOUNTER — Encounter: Payer: Self-pay | Admitting: Emergency Medicine

## 2022-04-09 DIAGNOSIS — Z1211 Encounter for screening for malignant neoplasm of colon: Secondary | ICD-10-CM

## 2022-04-09 NOTE — Telephone Encounter (Signed)
Place referral for Dr. Benson Norway, GI doctor, please.

## 2022-05-07 ENCOUNTER — Other Ambulatory Visit: Payer: Self-pay | Admitting: Hematology and Oncology

## 2022-05-07 DIAGNOSIS — Z1231 Encounter for screening mammogram for malignant neoplasm of breast: Secondary | ICD-10-CM

## 2022-05-13 ENCOUNTER — Ambulatory Visit: Payer: No Typology Code available for payment source | Attending: Internal Medicine | Admitting: Internal Medicine

## 2022-05-13 ENCOUNTER — Encounter: Payer: Self-pay | Admitting: Internal Medicine

## 2022-05-13 VITALS — BP 122/85 | HR 86 | Resp 17 | Ht 65.0 in | Wt 257.6 lb

## 2022-05-13 DIAGNOSIS — R600 Localized edema: Secondary | ICD-10-CM | POA: Diagnosis not present

## 2022-05-13 DIAGNOSIS — M2142 Flat foot [pes planus] (acquired), left foot: Secondary | ICD-10-CM

## 2022-05-13 DIAGNOSIS — M310 Hypersensitivity angiitis: Secondary | ICD-10-CM

## 2022-05-13 DIAGNOSIS — M2140 Flat foot [pes planus] (acquired), unspecified foot: Secondary | ICD-10-CM | POA: Insufficient documentation

## 2022-05-13 DIAGNOSIS — M2141 Flat foot [pes planus] (acquired), right foot: Secondary | ICD-10-CM | POA: Diagnosis not present

## 2022-05-13 DIAGNOSIS — R768 Other specified abnormal immunological findings in serum: Secondary | ICD-10-CM | POA: Diagnosis not present

## 2022-05-13 NOTE — Progress Notes (Unsigned)
Office Visit Note  Patient: Erin Gallagher             Date of Birth: September 17, 1970           MRN: [id number]             PCP: Horald Pollen, MD Referring: Girtha Rm, NP-C Visit Date: 05/13/2022 Occupation: Endoscopy technician  Subjective:   History of Present Illness: Erin Gallagher is a 51 y.o. female here for evaluation of positive ANA and biopsy findings of LCV with posterior left leg skin rashes. 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 Symptoms started near the beginning of May about 2 weeks before clinic visit to evaluate and was started on colchicine afterwards.  She reports mild itching otherwise affected area was mostly asymptomatic.  Skin biopsy obtained from the affected area demonstrated LCV and recommended evaluation for possible early lupus. She completed the treatment with colchicine for a month and skin rashes resolved with no residual lesion or discoloration.  Currently she is off any treatment with no recurrence of symptoms and feels at baseline today.  She denies any photosensitive skin rashes, joint swelling, oral or nasal ulcers or Raynaud's symptoms.  She does have a mild swelling in the feet and ankles.  Labs reviewed 12/2021 ANA 1:40 homogenous RF neg CCP neg CRP <1 CBC wnl CMP unremarkable HBV sAb pos sAg neg HCV neg HIV neg UA +Blood +Bacteria  Activities of Daily Living:  Patient reports morning stiffness for 20-30 minutes.   Patient Reports nocturnal pain.  Difficulty dressing/grooming: Denies Difficulty climbing stairs: Denies Difficulty getting out of chair: Denies Difficulty using hands for taps, buttons, cutlery, and/or writing: Denies  Review of Systems  Constitutional:  Positive for fatigue.  HENT:  Negative for mouth sores and mouth dryness.   Eyes:  Positive for dryness.  Respiratory:  Negative for shortness of breath.   Cardiovascular:  Negative for chest pain and  palpitations.  Gastrointestinal:  Negative for blood in stool, constipation and diarrhea.  Endocrine: Negative for increased urination.  Genitourinary:  Negative for involuntary urination.  Musculoskeletal:  Positive for joint pain, joint pain, joint swelling and morning stiffness. Negative for gait problem, myalgias, muscle weakness, muscle tenderness and myalgias.  Skin:  Negative for color change, rash, hair loss and sensitivity to sunlight.  Allergic/Immunologic: Negative for susceptible to infections.  Neurological:  Positive for dizziness. Negative for headaches.  Hematological:  Negative for swollen glands.  Psychiatric/Behavioral:  Negative for depressed mood and sleep disturbance. The patient is not nervous/anxious.     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   Suspected sleep apnea 10/30/2021   Paresthesia 10/30/2021   BMI 40.0-44.9, adult ([place]) 02/06/2021   Morbid obesity ([place]) 02/06/2021   History of breast cancer 02/06/2021   Malignant neoplasm of upper-outer quadrant of left breast in female, estrogen receptor positive ([place]) 05/20/2019   Arthritis 11/23/2017   Essential hypertension 06/22/2013    Past Medical History:  Diagnosis Date   ANA positive 01/20/2022   Arthritis    Blood transfusion without reported diagnosis    Cancer ([place])    left breast cancer   Family history of breast cancer    Family history of colon cancer    Family history of lung cancer    Family history of throat  cancer    Gall bladder disease    gall bladder removal   Hypertension    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   Personal history of radiation therapy     Family History  Problem Relation Age of Onset   Hypertension Mother    Hypertension Father    Colon cancer Father        dx 20s   Throat cancer Maternal Uncle    Lung cancer Paternal Uncle     Stroke Maternal Grandmother    Hypertension Maternal Grandmother    Aneurysm Maternal Grandmother    Healthy Son    Past Surgical History:  Procedure Laterality Date   BREAST BIOPSY Left 05/16/2019   BREAST LUMPECTOMY Left 06/14/2019   BREAST LUMPECTOMY WITH RADIOACTIVE SEED AND SENTINEL LYMPH NODE BIOPSY Left 06/14/2019   Procedure: LEFT BREAST LUMPECTOMY WITH RADIOACTIVE SEED AND SENTINEL LYMPH NODE BIOPSY;  Surgeon: [name], MD;  Location: [facility];  Service: General;  Laterality: Left;   [place]   Social History   Social History Narrative   Caffiene 1 cup coffee daily.    Lives home with spouse   Works at [employer]   Education 45yr   Children one.    Immunization History  Administered Date(s) Administered   Influenza Inj Mdck Quad Pf 05/26/2019   Influenza,inj,Quad PF,6+ Mos 06/14/2014, 06/01/2020   Influenza-Unspecified 06/01/2015   Moderna Sars-Covid-2 Vaccination 04/06/2020, 05/04/2020   Pneumococcal Conjugate-13 02/06/2021   Td 12/03/2017     Objective: Vital Signs: BP 122/85 (BP Location: Right Arm, Patient Position: Sitting, Cuff Size: Large)   Pulse 86   Resp 17   Ht '5\' 5"'$  (1.651 m)   Wt 257 lb 9.6 oz (116.8 kg)   BMI 42.87 kg/m    Physical Exam Constitutional:      Appearance: She is obese.  HENT:     Mouth/Throat:     Mouth: Mucous membranes are moist.     Pharynx: Oropharynx is clear.  Eyes:     Conjunctiva/sclera: Conjunctivae normal.  Cardiovascular:     Rate and Rhythm: Normal rate and regular rhythm.  Pulmonary:     Effort: Pulmonary effort is normal.     Breath sounds: Normal breath sounds.  Musculoskeletal:     Right lower leg: Edema present.     Left lower leg: Edema present.     Comments: Right worse than left 1+ pitting pedal edema there is a faint hyperpigmentation no loss of hair in affected areas  Lymphadenopathy:     Cervical: No cervical  adenopathy.  Skin:    General: Skin is warm and dry.     Comments: No digital pitting or nail changes Nailfold capillaroscopy appears normal  Neurological:     Mental Status: She is alert.  Psychiatric:        Mood and Affect: Mood normal.      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  Imaging: No  results found.  Recent Labs: Lab Results  Component Value Date   WBC 6.3 01/17/2022   HGB 13.6 01/17/2022   PLT 278.0 01/17/2022   NA 139 01/17/2022   K 4.4 01/17/2022   CL 104 01/17/2022   CO2 26 01/17/2022   GLUCOSE 114 (H) 01/17/2022   BUN 14 01/17/2022   CREATININE 0.79 01/17/2022   BILITOT 0.4 01/17/2022   ALKPHOS 53 01/17/2022   AST 18 01/17/2022   ALT 21 01/17/2022   PROT 7.3 01/17/2022   ALBUMIN 4.2 01/17/2022   CALCIUM 9.2 01/17/2022   GFRAA 112 06/01/2020    Speciality Comments: No specialty comments available.  Procedures:  No procedures performed Allergies: Other   Assessment / Plan:     Visit Diagnoses: ANA positive - Plan: Anti-Smith antibody, Sjogrens syndrome-A extractable nuclear antibody, Sjogrens syndrome-B extractable nuclear antibody, Anti-DNA antibody, double-stranded, C3 and C4, Protein / creatinine ratio, urine  No other clinical criteria for SLE and exam is benign today with the original complain resolving. Checking dsDNA, SM, SSA, SSB Abs also serum complements and screening for proteinuria. If workup is negative or borderline would not recommend any new  treatment at this time, possible observation or scheduled f/u if more concerning serologic criteria.  Leukocytoclastic vasculitis (HCC)  Rash is currently resolved. Multiple possible causes or even idiopathic but autoimmune workup today as above.  Pedal edema  Looks benign, likely multiple contributions from varicose veins, weight, work, although cannot entirely exclude possibility of vasculitis with recent cutaneous findings. Recommend initial steps just trying to limit stationary dependent position and possibly adding compression socks.  Orders: Orders Placed This Encounter  Procedures   Anti-Smith antibody   Sjogrens syndrome-A extractable nuclear antibody   Sjogrens syndrome-B extractable nuclear antibody   Anti-DNA antibody, double-stranded   C3 and C4   Protein / creatinine ratio, urine   No orders of the defined types were placed in this encounter.    Follow-Up Instructions: Return if symptoms worsen or fail to improve.   Collier Salina, MD  Note - This record has been created using [employer].  Chart creation errors have been sought, but may not always  have been located. Such creation errors do not reflect on  the standard of medical care.

## 2022-05-14 LAB — SJOGRENS SYNDROME-B EXTRACTABLE NUCLEAR ANTIBODY: SSB (La) (ENA) Antibody, IgG: <1 AI

## 2022-05-14 LAB — ANTI-DNA ANTIBODY, DOUBLE-STRANDED: ds DNA Ab: <1 IU/mL

## 2022-05-14 LAB — PROTEIN / CREATININE RATIO, URINE
Creatinine, Urine: 89 mg/dL (ref 20–275)
Protein/Creat Ratio: 56 mg/g creat (ref 24–184)
Protein/Creatinine Ratio: 0.056 mg/mg creat (ref 0.024–0.184)
Total Protein, Urine: 5 mg/dL (ref 5–24)

## 2022-05-14 LAB — C3 AND C4
C3 Complement: 171 mg/dL (ref 83–193)
C4 Complement: 30 mg/dL (ref 15–57)

## 2022-05-14 LAB — ANTI-SMITH ANTIBODY: ENA SM Ab Ser-aCnc: <1 AI

## 2022-05-14 LAB — SJOGRENS SYNDROME-A EXTRACTABLE NUCLEAR ANTIBODY: SSA (Ro) (ENA) Antibody, IgG: <1 AI

## 2022-05-15 NOTE — Progress Notes (Signed)
Results for antibody tests are all negative and there is no evidence of kidney problem. Based on our exam and these results I think lupus is very unlikely. We do not need to keep a scheduled follow up at this time.

## 2022-05-26 NOTE — Progress Notes (Signed)
Patient Care Team: Horald Pollen, MD as PCP - General (Internal Medicine) [name], MD as Consulting Physician (General Surgery) Nicholas Lose, MD as Consulting Physician (Hematology and Oncology) Kyung Rudd, MD as Consulting Physician (Radiation Oncology)  DIAGNOSIS:  Encounter Diagnosis  Name Primary?   Malignant neoplasm of upper-outer quadrant of left breast in female, estrogen receptor positive ([place])     SUMMARY OF ONCOLOGIC HISTORY: Oncology History  Malignant neoplasm of upper-outer quadrant of left breast in female, estrogen receptor positive ([place])  05/20/2019 Initial Diagnosis   Routine screening mammogram detected a 1.4cm left breast mass at the 2:30 position, no axillary adenopathy. 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    06/05/2019 Genetic Testing   Negative genetic testing. No pathogenic variants identified on the [facility] Common Hereditary Cancers Panel + STAT Breast Cancer panel.The STAT Breast cancer panel offered by [facility] includes sequencing and rearrangement analysis for the following 9 genes:  ATM, BRCA1, BRCA2, CDH1, CHEK2, PALB2, PTEN, STK11 and TP53. The Common Hereditary Cancers Panel offered by [facility] includes sequencing and/or deletion duplication testing of the following 47 genes: APC, ATM, AXIN2, BARD1, BMPR1A, BRCA1, BRCA2, BRIP1, CDH1, CDKN2A (p14ARF), CDKN2A (p16INK4a), CKD4, CHEK2, CTNNA1, DICER1, EPCAM (Deletion/duplication testing only), GREM1 (promoter region deletion/duplication testing only), KIT, MEN1, MLH1, MSH2, MSH3, MSH6, MUTYH, NBN, NF1, NHTL1, PALB2, PDGFRA, PMS2, POLD1, POLE, PTEN, RAD50, RAD51C, RAD51D, SDHB, SDHC, SDHD, SMAD4, SMARCA4. STK11, TP53, TSC1, TSC2, and VHL.  The following genes were evaluated for sequence changes only: SDHA and HOXB13 c.251G>A variant only. The report date is 06/05/2019.    06/14/2019 Surgery   Left lumpectomy Barry Dienes) [phone]): IDC, grade 2, with high  grade DCIS, 1.7cm, clear margins, 2 left axillary lymph nodes negative for carcinoma.    06/24/2019 Oncotype testing   Recurrence score: 13; low risk, distant recurrence at 9 years: 4%   07/13/2019 - 08/31/2019 Radiation Therapy   The patient initially received a dose of 50.4 Gy in 28 fractions to the breast using whole-breast tangent fields. This was delivered using a 3-D conformal technique. The patient then received a boost to the seroma. This delivered an additional 10 Gy in 5 fractions using a 3-field photon boost technique. The total dose was 60.4 Gy.   09/2019 - 09/2024 Anti-estrogen oral therapy   Tamoxifen     CHIEF COMPLIANT: Follow-up of left breast cancer on tamoxifen  INTERVAL HISTORY: Erin Gallagher is a 51 y.o. with above-mentioned history of left breast cancer treated with left lumpectomy, radiation, and is currently on antiestrogen therapy with tamoxifen.  She presents to the clinic for a follow-up. She reports she still having mild hot flashes. She have some soreness in breast. Does have joint stiffness but states that it might be coming from her joint pain. She doesn't exercise because she don't have time. She does drink plenty of water.   ALLERGIES:  is allergic to other.  MEDICATIONS:  Current Outpatient Medications  Medication Sig Dispense Refill   amLODipine (NORVASC) 10 MG tablet Take 1 tablet (10 mg total) by mouth daily. 90 tablet 3   cyclobenzaprine (FLEXERIL) 10 MG tablet Take 1/2 to 1 tablet (5-10 mg total) by mouth 3 (three) times daily as needed for muscle spasms. 60 tablet 1   losartan-hydrochlorothiazide (HYZAAR) 50-12.5 MG tablet Take 1 tablet by mouth daily. 90 tablet 3   tamoxifen (NOLVADEX) 20 MG tablet TAKE 1 TABLET BY MOUTH ONCE DAILY 90 tablet 3  VITAMIN D PO Take 1,000 Units by mouth daily.     No current facility-administered medications for this visit.    PHYSICAL EXAMINATION: ECOG PERFORMANCE STATUS: 1 - Symptomatic but completely  ambulatory  Vitals:   05/30/22 0930  BP: 136/86  Pulse: 99  Resp: 14  Temp: 97.7 F (36.5 C)  SpO2: 97%   Filed Weights   05/30/22 0930  Weight: 262 lb 14.4 oz (119.3 kg)    BREAST: No palpable masses or nodules in either right or left breasts. No palpable axillary supraclavicular or infraclavicular adenopathy no breast tenderness or nipple discharge.  Tenderness to palpation.  (exam performed in the presence of a chaperone)  LABORATORY DATA:  I have reviewed the data as listed    Latest Ref Rng & Units 01/17/2022    8:28 AM 10/30/2021    2:31 PM 06/01/2020   10:12 AM  CMP  Glucose 70 - 99 mg/dL 114  119  90   BUN 6 - 23 mg/dL 14  10  8    Creatinine 0.40 - 1.20 mg/dL 0.79  0.74  0.73   Sodium 135 - 145 mEq/L 139  139  139   Potassium 3.5 - 5.1 mEq/L 4.4  3.5  4.4   Chloride 96 - 112 mEq/L 104  105  101   CO2 19 - 32 mEq/L 26  27  22    Calcium 8.4 - 10.5 mg/dL 9.2  8.8  8.7   Total Protein 6.0 - 8.3 g/dL 7.3  6.8    Total Bilirubin 0.2 - 1.2 mg/dL 0.4  0.4    Alkaline Phos 39 - 117 U/L 53  44    AST 0 - 37 U/L 18  18    ALT 0 - 35 U/L 21  19      Lab Results  Component Value Date   WBC 6.3 01/17/2022   HGB 13.6 01/17/2022   HCT 41.8 01/17/2022   MCV 82.7 01/17/2022   PLT 278.0 01/17/2022   NEUTROABS 4.2 01/17/2022    ASSESSMENT & PLAN:  Malignant neoplasm of upper-outer quadrant of left breast in female, estrogen receptor positive ([place]) 05/20/2019: Routine screening mammogram detected a 1.4cm left breast mass at the 2:30 position, no axillary adenopathy. 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  T1CN0 stage Ia clinical stage   06/14/2019: Left lumpectomy: Grade 2 IDC, 1.7 cm, high-grade DCIS, margins negative, no lymphovascular or perineural invasion, 0/2 lymph nodes negative, ER 100%, PR 100%, HER-2 negative, Ki-67 10% Oncotype DX score: 13, low risk, risk of distant recurrence at 9 years 4% Genetic testing:  Negative Adjuvant radiation therapy: 07/14/2019-08/29/2019   Current treatment: Adjuvant antiestrogen therapy with tamoxifen 20 mg daily, could switch to aromatase inhibitor once she is menopausal   Tamoxifen toxicities: Right lower extremity muscle aches and pains: I instructed her to take tamoxifen at bedtime and also to take vitamin D and tonic water. She continues to have menstrual cycles and therefore she is not yet in menopause.  She is getting a work-up for her joint aches and pains.   Breast cancer surveillance: 1.  Mammogram 06/19/2021: Benign breast density category B 2. breast exam 05/30/2022: Benign   Bone Density 02/11/22: T score +1.7  Return to clinic in 1 year for follow-up    No orders of the defined types were placed in this encounter.  The patient has a good understanding of the overall plan. she agrees with it.  she will call with any problems that may develop before the next visit here. Total time spent: 30 mins including face to face time and time spent for planning, charting and co-ordination of care   Harriette Ohara, MD 05/30/22    I Gardiner Coins am scribing for Dr. Lindi Adie  I have reviewed the above documentation for accuracy and completeness, and I agree with the above.

## 2022-05-30 ENCOUNTER — Inpatient Hospital Stay
Payer: No Typology Code available for payment source | Attending: Hematology and Oncology | Admitting: Hematology and Oncology

## 2022-05-30 ENCOUNTER — Other Ambulatory Visit (HOSPITAL_COMMUNITY): Payer: Self-pay

## 2022-05-30 DIAGNOSIS — Z853 Personal history of malignant neoplasm of breast: Secondary | ICD-10-CM | POA: Insufficient documentation

## 2022-05-30 DIAGNOSIS — Z79810 Long term (current) use of selective estrogen receptor modulators (SERMs): Secondary | ICD-10-CM | POA: Insufficient documentation

## 2022-05-30 DIAGNOSIS — C50412 Malignant neoplasm of upper-outer quadrant of left female breast: Secondary | ICD-10-CM | POA: Diagnosis not present

## 2022-05-30 DIAGNOSIS — Z170 Estrogen receptor positive status [ER+]: Secondary | ICD-10-CM

## 2022-05-30 MED ORDER — NA SULFATE-K SULFATE-MG SULF 17.5-3.13-1.6 GM/177ML PO SOLN
ORAL | 0 refills | Status: DC
  Filled 2022-05-30: qty 354, 1d supply, fill #0

## 2022-05-30 NOTE — Assessment & Plan Note (Signed)
05/20/2019:Routine screening mammogram detected a 1.4cm left breast mass at the 2:30 position, no axillary adenopathy. 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 T1CN0 stage Ia clinical stage  06/14/2019: Left lumpectomy: Grade 2 IDC, 1.7 cm, high-grade DCIS, margins negative, no lymphovascular or perineural invasion, 0/2 lymph nodes negative, ER 100%, PR 100%, HER-2 negative, Ki-67 10% Oncotype DX score: 13, low risk, risk of distant recurrence at 9 years 4% Genetic testing: Negative Adjuvant radiation therapy: 07/14/2019-08/29/2019  Current treatment: Adjuvant antiestrogen therapywith tamoxifen 20 mg daily, could switch to aromatase inhibitor once she is menopausal  Tamoxifen toxicities: Right lower extremity muscle aches and pains: I instructed her to take tamoxifen at bedtime and also to take vitamin D and tonic water.  Breast cancer surveillance: 1.Mammogram 06/19/2021: Benign breast density category B 2.breast exam 05/30/2022: Benign  Bone Density 02/11/22: T score +1.7  Return to clinic in 1 year for follow-up

## 2022-06-02 ENCOUNTER — Telehealth: Payer: Self-pay | Admitting: Hematology and Oncology

## 2022-06-02 NOTE — Telephone Encounter (Signed)
Scheduled appointment per 9/29 los. Left voicemail.

## 2022-06-03 ENCOUNTER — Other Ambulatory Visit (HOSPITAL_COMMUNITY): Payer: Self-pay

## 2022-06-10 NOTE — Progress Notes (Unsigned)
[facility] [address] [place]. [place] [address] [phone]       OFFICE FOLLOW UP NOTE  [name] Date of Birth:  Jun 05, 1971 Medical Record Number:  [id number]   Reason for visit: Initial CPAP follow-up    SUBJECTIVE:   CHIEF COMPLAINT:  No chief complaint on file.   HPI:   Update 06/11/2022 JM: Patient is being seen for initial CPAP compliance visit. Completed sleep study 7/25 which showed mild OSA, severe in REM sleep with total AHI of 11.4/hr, REM AHI of 42.5/hr, supine AHI of 14.8/hr and O2 nadir of 83%. Recommended initiating CPAP which was started on 8/17.           Consult visit 02/18/2022 Dr. Rexene Alberts: Erin Gallagher is a 51 year old right-handed woman with an underlying medical history of arthritis, ANA positive status, history of vasculitis, breast cancer with status post lumpectomy, hypertension, and severe obesity with a BMI of over 40, who reports snoring and excessive daytime somnolence.  I reviewed your office note from 10/30/2021.  Her Epworth sleepiness score is 12/24, fatigue severity score is 53/63.  She does not have any family history of sleep apnea, her father has oxygen at night.  Patient has had difficulty initiating and maintaining sleep for years.  She has trouble relaxing at night, she tried over-the-counter melatonin, up to 5 to 10 mg at night without any significant improvement.  She lives with her husband, she has 1 grown biological child and 1 stepchild.  She works as an Teaching laboratory technician.  She is a non-smoker and drinks alcohol on the weekend typically.  She drinks caffeine in the form of tea or coffee, about 1 serving per day on average, no daily coffee or tea or soda but averages about 1 serving.  She has nocturia about twice per average night, she typically takes her blood pressure medication with the hydrochlorothiazide in the evening along with her amlodipine.  She has occasional morning headaches which are not migraine-like,  more dull, achy, nagging.  She does take occasional over-the-counter Advil for these.  She has a TV in her bedroom but it is typically not on at night.     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   Personal history of radiation therapy     PSH:  Past Surgical History:  Procedure Laterality Date   BREAST BIOPSY Left 05/16/2019   BREAST LUMPECTOMY Left 06/14/2019   BREAST LUMPECTOMY WITH RADIOACTIVE SEED AND SENTINEL LYMPH NODE BIOPSY Left 06/14/2019   Procedure: LEFT BREAST LUMPECTOMY WITH RADIOACTIVE SEED AND SENTINEL LYMPH NODE BIOPSY;  Surgeon: [name], MD;  Location: [facility] [place] [facility];  Service: General;  Laterality: Left;   [place]    Social History:  Social History   Socioeconomic History   Marital status: Married  Spouse name: Not on file   Number of children: 1   Years of education: Not on file   Highest education level: Not on file  Occupational History   Occupation: Supervisor  Tobacco Use   Smoking status: Never    Passive exposure: Current   Smokeless tobacco: Never  Vaping Use   Vaping Use: Never used  Substance and Sexual Activity   Alcohol use: Yes    Alcohol/week: 2.0 standard drinks of alcohol    Types: 2 Standard drinks or equivalent per week    Comment: occasionally   Drug use: No   Sexual activity: Yes    Birth control/protection: Surgical    Comment: Tubal ligation  Other Topics Concern   Not on file   Social History Narrative   Caffiene 1 cup coffee daily.    Lives home with spouse   Works at [employer]   Education 28yr   Children one.    Social Determinants of Health   Financial Resource Strain: Not on file  Food Insecurity: Not on file  Transportation Needs: Not on file  Physical Activity: Sufficiently Active (12/03/2017)   Exercise Vital Sign    Days of Exercise per Week: 3 days    Minutes of Exercise per Session: 50 min  Stress: Not on file  Social Connections: Not on file  Intimate Partner Violence: Not on file    Family History:  Family History  Problem Relation Age of Onset   Hypertension Mother    Hypertension Father    Colon cancer Father        dx 613s  Throat cancer Maternal Uncle    Lung cancer Paternal Uncle    Stroke Maternal Grandmother    Hypertension Maternal Grandmother    Aneurysm Maternal Grandmother    Healthy Son     Medications:   Current Outpatient Medications on File Prior to Visit  Medication Sig Dispense Refill   amLODipine (NORVASC) 10 MG tablet Take 1 tablet (10 mg total) by mouth daily. 90 tablet 3   cyclobenzaprine (FLEXERIL) 10 MG tablet Take 1/2 to 1 tablet (5-10 mg total) by mouth 3 (three) times daily as needed for muscle spasms. 60 tablet 1   losartan-hydrochlorothiazide (HYZAAR) 50-12.5 MG tablet Take 1 tablet by mouth daily. 90 tablet 3   Na Sulfate-K Sulfate-Mg Sulf (SUPREP BOWEL PREP KIT) 17.5-3.13-1.6 GM/177ML SOLN Take as directed per package instructions 354 mL 0   tamoxifen (NOLVADEX) 20 MG tablet TAKE 1 TABLET BY MOUTH ONCE DAILY 90 tablet 3   VITAMIN D PO Take 1,000 Units by mouth daily.     No current facility-administered medications on file prior to visit.    Allergies:   Allergies  Allergen Reactions   Other Rash    Steri Strips cause itching and a rash      OBJECTIVE:  Physical Exam  There were no vitals filed for this visit. There is no height or weight on file to calculate BMI. No results  found.   General: well developed, well nourished, seated, in no evident distress Head: head normocephalic and atraumatic.   Neck: supple with no carotid or supraclavicular bruits Cardiovascular: regular rate and rhythm, no murmurs Musculoskeletal: no deformity Skin:  no rash/petichiae Vascular:  Normal pulses all extremities   Neurologic Exam Mental Status: Awake and fully alert. Oriented to place and time. Recent and remote memory intact. Attention span, concentration and fund of knowledge appropriate. Mood and affect appropriate.  Cranial Nerves: Pupils equal, briskly  reactive to light. Extraocular movements full without nystagmus. Visual fields full to confrontation. Hearing intact. Facial sensation intact. Face, tongue, palate moves normally and symmetrically.  Motor: Normal bulk and tone. Normal strength in all tested extremity muscles Sensory.: intact to touch , pinprick , position and vibratory sensation.  Coordination: Rapid alternating movements normal in all extremities. Finger-to-nose and heel-to-shin performed accurately bilaterally. Gait and Station: Arises from chair without difficulty. Stance is normal. Gait demonstrates normal stride length and balance without use of AD. Tandem walk and heel toe without difficulty.  Reflexes: 1+ and symmetric. Toes downgoing.         ASSESSMENT/PLAN: Erin Gallagher is a 51 y.o. year old female     OSA on CPAP : Compliance report shows satisfactory usage with optimal residual AHI.  Discussed continued nightly usage with ensuring greater than 4 hours nightly for optimal benefit and per insurance purposes.  Continue to follow with DME company for any needed supplies or CPAP related concerns     Follow up in *** or call earlier if needed   CC:  PCP: Horald Pollen, MD    I spent *** minutes of face-to-face and non-face-to-face time with patient.  This included previsit chart review, lab review, study review, order entry,  electronic health record documentation, patient education regarding diagnosis of sleep apnea with review and discussion of compliance report and answered all other questions to patient's satisfaction   Frann Rider, [facility]  [facility] [address] [place], [place] [address]  Phone [phone] Fax [phone] Note: This document was prepared with digital dictation and possible smart phrase technology. Any transcriptional errors that result from this process are unintentional.

## 2022-06-11 ENCOUNTER — Encounter: Payer: Self-pay | Admitting: Adult Health

## 2022-06-11 ENCOUNTER — Ambulatory Visit (INDEPENDENT_AMBULATORY_CARE_PROVIDER_SITE_OTHER): Payer: No Typology Code available for payment source | Admitting: Adult Health

## 2022-06-11 VITALS — BP 142/83 | HR 83 | Ht 65.5 in | Wt 256.1 lb

## 2022-06-11 DIAGNOSIS — G4733 Obstructive sleep apnea (adult) (pediatric): Secondary | ICD-10-CM

## 2022-06-11 NOTE — Progress Notes (Signed)
Community message sent to KGS:[id number]. 06/11/22 New orders have been placed for the above pt, DOB: 15-Feb-1971

## 2022-06-11 NOTE — Patient Instructions (Signed)
No changes today -continue nightly use of CPAP and increase nightly usage to ensure greater than 4 hours nightly per insurance requirements and for optimal benefit  You should be contacted by your DME company [employer] to review other mask options    Follow-up in 6 months or call earlier if needed

## 2022-06-25 ENCOUNTER — Other Ambulatory Visit (HOSPITAL_COMMUNITY): Payer: Self-pay

## 2022-06-26 ENCOUNTER — Ambulatory Visit
Admission: RE | Admit: 2022-06-26 | Discharge: 2022-06-26 | Disposition: A | Discharge status: Home / Self Care | Payer: No Typology Code available for payment source | Source: Ambulatory Visit | Attending: Hematology and Oncology | Admitting: Hematology and Oncology

## 2022-06-26 DIAGNOSIS — Z1231 Encounter for screening mammogram for malignant neoplasm of breast: Secondary | ICD-10-CM

## 2022-07-14 ENCOUNTER — Other Ambulatory Visit (HOSPITAL_COMMUNITY): Payer: Self-pay

## 2022-08-06 ENCOUNTER — Encounter: Payer: Self-pay | Admitting: Emergency Medicine

## 2022-08-06 NOTE — Telephone Encounter (Signed)
Yes, it is okay 

## 2022-09-17 DIAGNOSIS — G4733 Obstructive sleep apnea (adult) (pediatric): Secondary | ICD-10-CM | POA: Diagnosis not present

## 2022-09-30 ENCOUNTER — Encounter: Payer: Self-pay | Admitting: Emergency Medicine

## 2022-10-03 ENCOUNTER — Ambulatory Visit: Payer: 59 | Admitting: Family Medicine

## 2022-10-07 ENCOUNTER — Ambulatory Visit (INDEPENDENT_AMBULATORY_CARE_PROVIDER_SITE_OTHER): Payer: 59 | Admitting: Family Medicine

## 2022-10-07 ENCOUNTER — Encounter: Payer: Self-pay | Admitting: Family Medicine

## 2022-10-07 VITALS — BP 132/86 | HR 88 | Temp 97.6°F | Ht 65.5 in | Wt 266.0 lb

## 2022-10-07 DIAGNOSIS — M2141 Flat foot [pes planus] (acquired), right foot: Secondary | ICD-10-CM

## 2022-10-07 DIAGNOSIS — M2142 Flat foot [pes planus] (acquired), left foot: Secondary | ICD-10-CM

## 2022-10-07 DIAGNOSIS — M79672 Pain in left foot: Secondary | ICD-10-CM

## 2022-10-07 DIAGNOSIS — R600 Localized edema: Secondary | ICD-10-CM | POA: Diagnosis not present

## 2022-10-07 DIAGNOSIS — M79671 Pain in right foot: Secondary | ICD-10-CM | POA: Insufficient documentation

## 2022-10-07 NOTE — Patient Instructions (Signed)
Take 2 Aleve twice daily with food.  Use ice massage as discussed.  Wear good supportive shoes.  Elevate your legs when you are sitting.  Pump your calves when you are standing 1 place for long periods.  It is a good idea to wear compression stockings.  Eat a low-sodium diet.  Follow-up with Dr. Mitchel Honour if you continue having swelling of the feet and ankles.  This could be caused by your blood pressure medication, amlodipine.   You should hear from [facility]

## 2022-10-07 NOTE — Progress Notes (Signed)
Subjective:     Patient ID: Erin Gallagher, female    DOB: Aug 12, 1971, 52 y.o.   MRN: [id number]  Chief Complaint  Patient presents with   Foot Pain    Bilateral constant foot pain for about 3 weeks. Reports it as a burning pain but can have "stabs" to it at times.    HPI Patient is in today for a 3 to 4-week history of bilateral foot pain.  Pain is mostly in the heel and radiates along the anterior sole.  Occasionally it feels like burning.  She does stand for long periods, working as a Surveyor, quantity.  She is aware that she has flatfeet and is wearing supportive shoes. No numbness or tingling.  She has tried Voltaren gel without relief.  Aleve helps somewhat.  Using ice packs.  Request referral to podiatry.  She also reports bilateral ankle edema.  This has been ongoing, not worsening.  She occasionally wears compression stockings at work.  She is on amlodipine as well as losartan-HCTZ.  No fever, chills, dizziness, chest pain, shortness of breath, abdominal pain, nausea, vomiting or diarrhea.  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   Personal history of radiation therapy     Past Surgical History:  Procedure Laterality Date   BREAST BIOPSY Left 05/16/2019   BREAST LUMPECTOMY Left 06/14/2019   BREAST LUMPECTOMY WITH RADIOACTIVE SEED AND SENTINEL LYMPH NODE BIOPSY Left 06/14/2019   Procedure: LEFT BREAST LUMPECTOMY WITH RADIOACTIVE SEED AND SENTINEL LYMPH NODE BIOPSY;  Surgeon: [name], MD;  Location: [facility];  Service: General;  Laterality: Left;   [place]    Family History  Problem Relation Age of Onset   Hypertension Mother    Hypertension Father    Colon cancer Father        dx 18s   Throat cancer Maternal Uncle    Lung cancer Paternal Uncle    Stroke Maternal Grandmother    Hypertension Maternal Grandmother    Aneurysm Maternal Grandmother    Healthy Son     Social History   Socioeconomic History   Marital status: Married    Spouse name: Not on file   Number of children: 1   Years of education: Not on file   Highest education level: Not on file  Occupational History   Occupation: Librarian, academic  Tobacco Use   Smoking status: Never    Passive exposure: Current   Smokeless tobacco: Never  Vaping Use   Vaping Use: Never used  Substance and Sexual Activity   Alcohol use: Yes    Alcohol/week: 2.0 standard drinks of alcohol    Types: 2 Standard drinks or equivalent per week    Comment: occasionally   Drug use: No   Sexual activity: Yes    Birth control/protection: Surgical  Comment: Tubal ligation  Other Topics Concern   Not on file  Social History Narrative   Caffiene 1 cup coffee daily.    Lives home with spouse   Works at [employer]   Education 78yr   Children one.    Social Determinants of Health   Financial Resource Strain: Not on file  Food Insecurity: Not on file  Transportation Needs: Not on file  Physical Activity: Sufficiently Active (12/03/2017)   Exercise Vital Sign    Days of Exercise per Week: 3 days    Minutes of Exercise per Session: 50 min  Stress: Not on file  Social Connections: Not on file  Intimate Partner Violence: Not on file    Outpatient Medications Prior to Visit  Medication Sig Dispense Refill   amLODipine (NORVASC) 10 MG tablet Take 1 tablet (10 mg total) by mouth daily. 90 tablet 3   cyclobenzaprine (FLEXERIL) 10 MG tablet Take 1/2 to 1 tablet (5-10 mg  total) by mouth 3 (three) times daily as needed for muscle spasms. 60 tablet 1   losartan-hydrochlorothiazide (HYZAAR) 50-12.5 MG tablet Take 1 tablet by mouth daily. 90 tablet 3   VITAMIN D PO Take 1,000 Units by mouth daily.     Na Sulfate-K Sulfate-Mg Sulf (SUPREP BOWEL PREP KIT) 17.5-3.13-1.6 GM/177ML SOLN Take as directed per package instructions 354 mL 0   No facility-administered medications prior to visit.    Allergies  Allergen Reactions   Other Rash    Steri Strips cause itching and a rash    ROS     Objective:    Physical Exam Constitutional:      General: She is not in acute distress.    Appearance: She is not ill-appearing.  Cardiovascular:     Rate and Rhythm: Normal rate.  Pulmonary:     Effort: Pulmonary effort is normal.  Musculoskeletal:     Right lower leg: 1+ Edema present.     Left lower leg: Edema present.     Right ankle: Swelling present. No tenderness. Normal range of motion. Normal pulse.     Left ankle: Swelling present. No tenderness. Normal range of motion. Normal pulse.     Right foot: Normal range of motion and normal capillary refill. Tenderness present. Normal pulse.     Left foot: Normal range of motion and normal capillary refill. Tenderness present. Normal pulse.     Comments: Bilateral posterior and plantar heel TTP  Skin:    General: Skin is warm and dry.     Capillary Refill: Capillary refill takes less than 2 seconds.  Neurological:     General: No focal deficit present.     Mental Status: She is alert and oriented to person, place, and time.     Sensory: No sensory deficit.     Motor: No weakness.     Coordination: Coordination normal.     Gait: Gait normal.  Psychiatric:        Mood and Affect: Mood normal.        Behavior: Behavior normal.     BP 132/86 (BP Location: Left Arm, Patient Position: Sitting, Cuff Size: Large)   Pulse 88   Temp 97.6 F (36.4 C) (Temporal)   Ht 5' 5.5" (1.664 m)   Wt 266 lb (120.7 kg)   SpO2  100%   BMI 43.59 kg/m  Wt Readings from Last 3 Encounters:  10/07/22 266 lb (120.7 kg)  06/11/22 256 lb 2 oz (116.2 kg)  05/30/22 262 lb  14.4 oz (119.3 kg)       Assessment & Plan:   Problem List Items Addressed This Visit       Other   Bilateral foot pain - Primary   Relevant Orders   Ambulatory referral to Podiatry   Pedal edema   Pes planus   Relevant Orders   Ambulatory referral to Podiatry   She has good blood flow of bilateral lower extremities.  Suspect tendinitis or plantar fasciitis. She will take 2 Aleve twice daily with food.  Use ice massage and topical analgesic.  Discussed elevating her feet when sitting and wearing compression stockings.  Discussed ankle exercises and pumping her calves for fluid return.  Discussed low-sodium diet.  She is on amlodipine which may be contributing to lower extremity edema.  Follow-up with Dr. Mitchel Honour for this.  I have discontinued Golden Gallagher. Erin "VAL"'s Na Sulfate-K Sulfate-Mg Sulf. I am also having her maintain her VITAMIN D PO, amLODipine, losartan-hydrochlorothiazide, and cyclobenzaprine.  No orders of the defined types were placed in this encounter.

## 2022-10-15 ENCOUNTER — Other Ambulatory Visit: Payer: Self-pay | Admitting: Podiatry

## 2022-10-15 ENCOUNTER — Ambulatory Visit: Payer: 59 | Admitting: Podiatry

## 2022-10-15 ENCOUNTER — Encounter: Payer: Self-pay | Admitting: Podiatry

## 2022-10-15 ENCOUNTER — Ambulatory Visit (INDEPENDENT_AMBULATORY_CARE_PROVIDER_SITE_OTHER): Payer: 59

## 2022-10-15 ENCOUNTER — Other Ambulatory Visit (HOSPITAL_COMMUNITY): Payer: Self-pay

## 2022-10-15 VITALS — BP 124/87 | HR 81

## 2022-10-15 DIAGNOSIS — M722 Plantar fascial fibromatosis: Secondary | ICD-10-CM

## 2022-10-15 DIAGNOSIS — M778 Other enthesopathies, not elsewhere classified: Secondary | ICD-10-CM

## 2022-10-15 MED ORDER — MELOXICAM 15 MG PO TABS
15.0000 mg | ORAL_TABLET | Freq: Every day | ORAL | 3 refills | Status: DC
  Filled 2022-10-15: qty 30, 30d supply, fill #0
  Filled 2022-11-15: qty 30, 30d supply, fill #1
  Filled 2022-12-22: qty 30, 30d supply, fill #2

## 2022-10-15 MED ORDER — TRIAMCINOLONE ACETONIDE 40 MG/ML IJ SUSP
40.0000 mg | Freq: Once | INTRAMUSCULAR | Status: AC
  Administered 2022-10-15: 40 mg

## 2022-10-15 MED ORDER — METHYLPREDNISOLONE 4 MG PO TBPK
ORAL_TABLET | ORAL | 0 refills | Status: DC
  Filled 2022-10-15: qty 21, 6d supply, fill #0

## 2022-10-15 NOTE — Patient Instructions (Signed)

## 2022-10-15 NOTE — Progress Notes (Signed)
Subjective:  Patient ID: Erin Gallagher, female    DOB: 1971/02/26,  MRN: [id number] HPI Chief Complaint  Patient presents with   Foot Pain    Plantar heel/anterior ankle bilateral (R>L) - aching, swelling x couple months, no injury, tried aleve, PCP eval-said to try frozen water bottle massaging   New Patient (Initial Visit)    52 y.o. female presents with the above complaint.   ROS: Denies fever chills nausea vomit muscle aches pains calf pain back pain chest pain shortness of breath.  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   Personal history of radiation therapy    Past Surgical History:  Procedure Laterality Date   BREAST BIOPSY Left 05/16/2019   BREAST LUMPECTOMY Left 06/14/2019   BREAST LUMPECTOMY WITH RADIOACTIVE SEED AND SENTINEL LYMPH NODE BIOPSY Left 06/14/2019   Procedure: LEFT BREAST LUMPECTOMY WITH RADIOACTIVE SEED AND SENTINEL LYMPH NODE BIOPSY;  Surgeon: [name], MD;  Location: [facility];  Service: General;  Laterality: Left;   [place]    Current Outpatient Medications:    meloxicam (MOBIC) 15 MG tablet, Take 1 tablet (15 mg total) by mouth daily., Disp: 30 tablet, Rfl: 3   methylPREDNISolone (MEDROL DOSEPAK) 4 MG TBPK tablet, Take as directed per package instructions., Disp: 21 tablet, Rfl: 0   amLODipine (NORVASC) 10 MG tablet, Take 1 tablet (10 mg total) by mouth daily., Disp: 90 tablet, Rfl: 3   cyclobenzaprine (FLEXERIL) 10 MG tablet, Take 1/2 to 1 tablet (5-10 mg total) by mouth 3 (three) times daily  as needed for muscle spasms., Disp: 60 tablet, Rfl: 1   losartan-hydrochlorothiazide (HYZAAR) 50-12.5 MG tablet, Take 1 tablet by mouth daily., Disp: 90 tablet, Rfl: 3   VITAMIN D PO, Take 1,000 Units by mouth daily., Disp: , Rfl:   Allergies  Allergen Reactions   Other Rash    Steri Strips cause itching and a rash   Review of Systems Objective:   Vitals:   10/15/22 0908  BP: 124/87  Pulse: 81    General: Well developed, nourished, in no acute distress, alert and oriented x3   Dermatological: Skin is warm, dry and supple bilateral. Nails x 10 are well maintained; remaining integument appears unremarkable at this time. There are no open sores, no preulcerative lesions, no rash or signs of infection present.  Vascular: Dorsalis Pedis artery and Posterior Tibial artery pedal pulses are 2/4 bilateral with immedate capillary fill time. Pedal hair growth present. No varicosities and no lower extremity edema present bilateral.   Neruologic: Grossly intact via light touch bilateral. Vibratory intact via tuning fork bilateral. Protective threshold with Semmes Wienstein monofilament intact to all pedal sites bilateral. Patellar and Achilles deep tendon reflexes 2+ bilateral. No Babinski or clonus noted bilateral.   Musculoskeletal: No gross boney pedal deformities bilateral. No pain, crepitus, or limitation noted with foot and ankle range of motion bilateral. Muscular strength 5/5 in all  groups tested bilateral.  Pain on palpation medial calcaneal tubercles bilaterally.  She has flexible pes planus bilateral with soft fluctuance edema to the dorsal aspect of the bilateral foot she also has pain on end range of motion of the subtalar joint.  Gait: Unassisted, Nonantalgic.    Radiographs:  Radiographs taken today demonstrate generally osseously mature individual with good mineralization of the bone.  Retrocalcaneal heel spur soft tissue increase in density plantar fascial Caney insertion site  osteoarthritic changes of the midfoot.  Pes planovalgus is noted bilaterally.  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  Plantar fascia braces bilaterally.  Injected bilateral heels today 20 mg Kenalog 5 mg Marcaine point maximal tenderness.  Discussed appropriate shoe gear stretching exercise ice therapy sugar modifications.  Follow-up with her in 1 month.     Erin Constantine T. [place], [place]

## 2022-10-16 ENCOUNTER — Other Ambulatory Visit (HOSPITAL_BASED_OUTPATIENT_CLINIC_OR_DEPARTMENT_OTHER): Payer: Self-pay

## 2022-10-18 DIAGNOSIS — G4733 Obstructive sleep apnea (adult) (pediatric): Secondary | ICD-10-CM | POA: Diagnosis not present

## 2022-10-20 DIAGNOSIS — G4733 Obstructive sleep apnea (adult) (pediatric): Secondary | ICD-10-CM | POA: Diagnosis not present

## 2022-10-22 ENCOUNTER — Other Ambulatory Visit: Payer: Self-pay | Admitting: Hematology and Oncology

## 2022-10-23 ENCOUNTER — Other Ambulatory Visit (HOSPITAL_COMMUNITY): Payer: Self-pay

## 2022-10-23 MED ORDER — TAMOXIFEN CITRATE 20 MG PO TABS
ORAL_TABLET | Freq: Every day | ORAL | 3 refills | Status: DC
  Filled 2022-10-23: qty 90, 90d supply, fill #0
  Filled 2023-02-09: qty 90, 90d supply, fill #1
  Filled 2023-06-14: qty 90, 90d supply, fill #2

## 2022-11-10 ENCOUNTER — Other Ambulatory Visit: Payer: Self-pay | Admitting: Emergency Medicine

## 2022-11-10 ENCOUNTER — Other Ambulatory Visit (HOSPITAL_COMMUNITY): Payer: Self-pay

## 2022-11-10 MED ORDER — AMLODIPINE BESYLATE 10 MG PO TABS
10.0000 mg | ORAL_TABLET | Freq: Every day | ORAL | 0 refills | Status: DC
  Filled 2022-11-10: qty 30, 30d supply, fill #0

## 2022-11-10 MED ORDER — LOSARTAN POTASSIUM-HCTZ 50-12.5 MG PO TABS
1.0000 | ORAL_TABLET | Freq: Every day | ORAL | 0 refills | Status: DC
  Filled 2022-11-10: qty 30, 30d supply, fill #0

## 2022-11-13 ENCOUNTER — Ambulatory Visit: Payer: 59 | Admitting: Podiatry

## 2022-11-13 DIAGNOSIS — M722 Plantar fascial fibromatosis: Secondary | ICD-10-CM | POA: Diagnosis not present

## 2022-11-13 MED ORDER — TRIAMCINOLONE ACETONIDE 40 MG/ML IJ SUSP
40.0000 mg | Freq: Once | INTRAMUSCULAR | Status: AC
  Administered 2022-11-13: 40 mg

## 2022-11-13 NOTE — Progress Notes (Signed)
She presents today for follow-up of her Planter fasciitis she states it really is not doing that much better she took her medication and continues to take meloxicam on a regular basis.  States that she might be about 10% improved.  Objective: Vitals are stable oriented x 3 palpable.  There is pain on palpation medial calcaneal tubercle on the right foot.  The majority of her symptoms on the left foot are beneath the navicular tuberosity.  Minimal pain on palpation of the left heel.  Assessment: Planter fasciitis bilateral.  Plan: Injected both areas today 10 mg Kenalog 5 mg Marcaine point maximal tenderness.  Tolerated procedure well without complications.  She will continue all conservative therapies I will follow-up with her in

## 2022-11-16 DIAGNOSIS — G4733 Obstructive sleep apnea (adult) (pediatric): Secondary | ICD-10-CM | POA: Diagnosis not present

## 2022-11-18 ENCOUNTER — Other Ambulatory Visit: Payer: Self-pay

## 2022-11-18 DIAGNOSIS — G4733 Obstructive sleep apnea (adult) (pediatric): Secondary | ICD-10-CM | POA: Diagnosis not present

## 2022-11-26 ENCOUNTER — Telehealth: Payer: Self-pay | Admitting: Adult Health

## 2022-11-26 NOTE — Telephone Encounter (Signed)
LVM and sent mychart msg informing pt of need to reschedule 12/11/22 appointment - NP out

## 2022-12-11 ENCOUNTER — Ambulatory Visit: Payer: No Typology Code available for payment source | Admitting: Adult Health

## 2022-12-11 ENCOUNTER — Ambulatory Visit: Payer: 59 | Admitting: Nurse Practitioner

## 2022-12-17 DIAGNOSIS — G4733 Obstructive sleep apnea (adult) (pediatric): Secondary | ICD-10-CM | POA: Diagnosis not present

## 2022-12-18 ENCOUNTER — Ambulatory Visit (INDEPENDENT_AMBULATORY_CARE_PROVIDER_SITE_OTHER): Payer: 59 | Admitting: Emergency Medicine

## 2022-12-18 ENCOUNTER — Ambulatory Visit (INDEPENDENT_AMBULATORY_CARE_PROVIDER_SITE_OTHER): Payer: 59

## 2022-12-18 ENCOUNTER — Encounter: Payer: Self-pay | Admitting: Emergency Medicine

## 2022-12-18 ENCOUNTER — Ambulatory Visit: Payer: 59 | Admitting: Podiatry

## 2022-12-18 VITALS — BP 120/70 | HR 74 | Temp 98.4°F | Ht 65.0 in | Wt 259.0 lb

## 2022-12-18 DIAGNOSIS — R072 Precordial pain: Secondary | ICD-10-CM | POA: Insufficient documentation

## 2022-12-18 DIAGNOSIS — R079 Chest pain, unspecified: Secondary | ICD-10-CM | POA: Insufficient documentation

## 2022-12-18 DIAGNOSIS — Z170 Estrogen receptor positive status [ER+]: Secondary | ICD-10-CM

## 2022-12-18 DIAGNOSIS — C50412 Malignant neoplasm of upper-outer quadrant of left female breast: Secondary | ICD-10-CM | POA: Diagnosis not present

## 2022-12-18 DIAGNOSIS — Z8249 Family history of ischemic heart disease and other diseases of the circulatory system: Secondary | ICD-10-CM | POA: Insufficient documentation

## 2022-12-18 DIAGNOSIS — I10 Essential (primary) hypertension: Secondary | ICD-10-CM | POA: Diagnosis not present

## 2022-12-18 DIAGNOSIS — M310 Hypersensitivity angiitis: Secondary | ICD-10-CM | POA: Diagnosis not present

## 2022-12-18 NOTE — Assessment & Plan Note (Signed)
Risk factors for CAD: Hypertension, obesity, and family history Clinically stable.  Normal EKG.  Unremarkable chest x-ray without cardiomegaly or signs of CHF. Benign physical examination.  No red flag signs or symptoms. Differential diagnosis discussed Recommend cardiology evaluation Referral placed today.

## 2022-12-18 NOTE — Patient Instructions (Signed)
Nonspecific Chest Pain Chest pain can be caused by many different conditions. Some causes of chest pain can be life-threatening. These will require treatment right away. Serious causes of chest pain include: Heart attack. A tear in the body's main blood vessel. Redness and swelling (inflammation) around your heart. Blood clot in your lungs. Other causes of chest pain may not be so serious. These include: Heartburn. Anxiety or stress. Damage to bones or muscles in your chest. Lung infections. Chest pain can feel like: Pain or discomfort in your chest. Crushing, pressure, aching, or squeezing pain. Burning or tingling. Dull or sharp pain that is worse when you move, cough, or take a deep breath. Pain or discomfort that is also felt in your back, neck, jaw, shoulder, or arm, or pain that spreads to any of these areas. It is hard to know whether your pain is caused by something that is serious or something that is not so serious. So it is important to see your doctor right away if you have chest pain. Follow these instructions at home: Medicines Take over-the-counter and prescription medicines only as told by your doctor. If you were prescribed an antibiotic medicine, take it as told by your doctor. Do not stop taking the antibiotic even if you start to feel better. Lifestyle  Rest as told by your doctor. Do not use any products that contain nicotine or tobacco, such as cigarettes, e-cigarettes, and chewing tobacco. If you need help quitting, ask your doctor. Do not drink alcohol. Make lifestyle changes as told by your doctor. These may include: Getting regular exercise. Ask your doctor what activities are safe for you. Eating a heart-healthy diet. A diet and nutrition specialist (dietitian) can help you to learn healthy eating options. Staying at a healthy weight. Treating diabetes or high blood pressure, if needed. Lowering your stress. Activities such as yoga and relaxation techniques  can help. General instructions Pay attention to any changes in your symptoms. Tell your doctor about them or any new symptoms. Avoid any activities that cause chest pain. Keep all follow-up visits as told by your doctor. This is important. You may need more testing if your chest pain does not go away. Contact a doctor if: Your chest pain does not go away. You feel depressed. You have a fever. Get help right away if: Your chest pain is worse. You have a cough that gets worse, or you cough up blood. You have very bad (severe) pain in your belly (abdomen). You pass out (faint). You have either of these for no clear reason: Sudden chest discomfort. Sudden discomfort in your arms, back, neck, or jaw. You have shortness of breath at any time. You suddenly start to sweat, or your skin gets clammy. You feel sick to your stomach (nauseous). You throw up (vomit). You suddenly feel lightheaded or dizzy. You feel very weak or tired. Your heart starts to beat fast, or it feels like it is skipping beats. These symptoms may be an emergency. Do not wait to see if the symptoms will go away. Get medical help right away. Call your local emergency services (911 in the [place] Do not drive yourself to the hospital. Summary Chest pain can be caused by many different conditions. The cause may be serious and need treatment right away. If you have chest pain, see your doctor right away. Follow your doctor's instructions for taking medicines and making lifestyle changes. Keep all follow-up visits as told by your doctor. This includes visits for any further   testing if your chest pain does not go away. Be sure to know the signs that show that your condition has become worse. Get help right away if you have these symptoms. This information is not intended to replace advice given to you by your health care provider. Make sure you discuss any questions you have with your health care provider. Document Revised:  07/03/2022 Document Reviewed: 07/03/2022 Elsevier Patient Education  2023 Elsevier Inc.  

## 2022-12-18 NOTE — Progress Notes (Signed)
Erin Gallagher 52 y.o.   Chief Complaint  Patient presents with   Chest Pain    Pt states been having chest pain on and off for 3 weeks. Also been having bad right leg cramps. Pt also states when she is standing or e    HISTORY OF PRESENT ILLNESS: This is a 52 y.o. female complaining of intermittent episodes of left-sided dull chest pain for the last 3 weeks Sometimes it radiates to left shoulder.  Sometimes she gets nauseous.  No known triggers.  More stressed than usual lately History of hypertension.  Family history of coronary artery disease.  Chest Pain  Pertinent negatives include no abdominal pain, cough, dizziness, fever, headaches, nausea, shortness of breath or vomiting.     Prior to Admission medications   Medication Sig Start Date End Date Taking? Authorizing Provider  amLODipine (NORVASC) 10 MG tablet Take 1 tablet (10 mg total) by mouth daily. 11/10/22 02/08/23  Georgina Quint, MD  cyclobenzaprine (FLEXERIL) 10 MG tablet Take 1/2 to 1 tablet (5-10 mg total) by mouth 3 (three) times daily as needed for muscle spasms. 10/30/21   Georgina Quint, MD  losartan-hydrochlorothiazide [facility]) 50-12.5 MG tablet Take 1 tablet by mouth daily. 11/10/22 02/08/23  Georgina Quint, MD  meloxicam (MOBIC) 15 MG tablet Take 1 tablet (15 mg total) by mouth daily. 10/15/22   Hyatt, Max T, DPM  methylPREDNISolone (MEDROL DOSEPAK) 4 MG TBPK tablet Take as directed per package instructions. 10/15/22   Hyatt, Max T, DPM  tamoxifen (NOLVADEX) 20 MG tablet TAKE 1 TABLET BY MOUTH ONCE DAILY 10/23/22 10/23/23  Serena Croissant, MD  VITAMIN D PO Take 1,000 Units by mouth daily.    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   Arthritis 11/23/2017   Essential hypertension 06/22/2013    Past Medical History:  Diagnosis Date   ANA positive 01/20/2022   Arthritis    Blood transfusion without reported diagnosis    Cancer    left breast cancer   Family history of breast cancer    Family history of colon cancer    Family history of lung cancer    Family history of throat cancer    Gall bladder disease    gall bladder removal   Hypertension    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   Personal history of radiation therapy     Past Surgical History:  Procedure Laterality Date   BREAST BIOPSY Left 05/16/2019   BREAST LUMPECTOMY Left 06/14/2019   BREAST LUMPECTOMY WITH RADIOACTIVE SEED AND SENTINEL LYMPH NODE BIOPSY Left 06/14/2019   Procedure: LEFT BREAST LUMPECTOMY WITH RADIOACTIVE SEED AND SENTINEL LYMPH NODE BIOPSY;  Surgeon: Almond Lint, MD;  Location: [facility];  Service: General;  Laterality: Left;   CESAREAN SECTION     1997   CHOLECYSTECTOMY     TUBAL LIGATION     1998    Social History   Socioeconomic History   Marital status: Married    Spouse name: Not on  file   Number of children: 1   Years of education: Not on file   Highest education level: 12th grade  Occupational History   Occupation: Merchandiser, retail  Tobacco Use   Smoking status: Never    Passive exposure: Current   Smokeless tobacco: Never  Vaping Use   Vaping Use: Never used  Substance and Sexual Activity   Alcohol use: Yes    Alcohol/week: 2.0 standard drinks of alcohol    Types: 2 Standard drinks or equivalent per week    Comment: occasionally   Drug use: No   Sexual activity: Yes    Birth control/protection: Surgical    Comment: Tubal ligation  Other Topics Concern   Not on file  Social History Narrative    Caffiene 1 cup coffee daily.    Lives home with spouse   Works at [employer]   Education 69yrs   Children one.    Social Determinants of Health   Financial Resource Strain: Medium Risk (12/15/2022)   Overall Financial Resource Strain (CARDIA)    Difficulty of Paying Living Expenses: Somewhat hard  Food Insecurity: Patient Declined (12/15/2022)   Hunger Vital Sign    Worried About Running Out of Food in the Last Year: Patient declined    Ran Out of Food in the Last Year: Patient declined  Transportation Needs: No Transportation Needs (12/15/2022)   PRAPARE - Administrator, Civil Service (Medical): No    Lack of Transportation (Non-Medical): No  Physical Activity: Insufficiently Active (12/15/2022)   Exercise Vital Sign    Days of Exercise per Week: 4 days    Minutes of Exercise per Session: 30 min  Stress: Stress Concern Present (12/15/2022)   [employer] of Occupational Health - Occupational Stress Questionnaire    Feeling of Stress : To some extent  Social Connections: Moderately Integrated (12/15/2022)   Social Connection and Isolation Panel [NHANES]    Frequency of Communication with Friends and Family: More than three times a week    Frequency of Social Gatherings with Friends and Family: Twice a week    Attends Religious Services: 1 to 4 times per year    Active Member of [employer] or Organizations: No    Attends Engineer, structural: Not on file    Marital Status: Married  Catering manager Violence: Not on file    Family History  Problem Relation Age of Onset   Hypertension Mother    Hypertension Father    Colon cancer Father        dx 83s   Throat cancer Maternal Uncle    Lung cancer Paternal Uncle    Stroke Maternal Grandmother    Hypertension Maternal Grandmother    Aneurysm Maternal Grandmother    Healthy Son      Review of Systems  Constitutional: Negative.  Negative for chills and fever.  HENT: Negative.  Negative for congestion and  sore throat.   Respiratory: Negative.  Negative for cough and shortness of breath.   Cardiovascular:  Positive for chest pain.  Gastrointestinal: Negative.  Negative for abdominal pain, diarrhea, nausea and vomiting.  Genitourinary: Negative.  Negative for dysuria and hematuria.  Skin: Negative.  Negative for rash.  Neurological: Negative.  Negative for dizziness and headaches.  All other systems reviewed and are negative.   Today's Vitals   12/18/22 0851  BP: (!) 132/98  Pulse: 74  Temp: 98.4 F (36.9 C)  TempSrc: Oral  SpO2: 97%  Weight: 259 lb (117.5 kg)  Height: 5\' 5"  (1.651 m)   Body mass index is 43.1 kg/m.   Physical Exam Vitals reviewed.  Constitutional:      Appearance: She is well-developed.  HENT:     Head: Normocephalic.     Mouth/Throat:     Mouth: Mucous membranes are moist.     Pharynx: Oropharynx is clear.  Eyes:     Extraocular Movements: Extraocular movements intact.     Pupils: Pupils are equal, round, and reactive to light.  Cardiovascular:     Rate and Rhythm: Normal rate and regular rhythm.     Pulses: Normal pulses.     Heart sounds: Normal heart sounds.  Pulmonary:     Effort: Pulmonary effort is normal.     Breath sounds: Normal breath sounds.  Musculoskeletal:     Cervical back: No tenderness.  Lymphadenopathy:     Cervical: No cervical adenopathy.  Skin:    General: Skin is warm and dry.     Capillary Refill: Capillary refill takes less than 2 seconds.  Neurological:     General: No focal deficit present.     Mental Status: She is alert and oriented to person, place, and time.  Psychiatric:        Mood and Affect: Mood normal.        Behavior: Behavior normal.    EKG: Normal sinus rhythm with ventricular rate of 76/min.  No acute ischemic changes.  DG Chest 2 View  Result Date: 12/18/2022 CLINICAL DATA:  Chest pain for 3-4 weeks. History of left breast lumpectomy in 2020. EXAM: CHEST - 2 VIEW COMPARISON:  Chest radiographs  09/06/2011 FINDINGS: Cardiac silhouette and mediastinal contours are within normal limits. The lungs are clear. No pleural effusion or pneumothorax. Mild-to-moderate multilevel degenerative disc changes of the thoracic spine. There are left axillary surgical clips. Cholecystectomy clips are noted on lateral view. IMPRESSION: No active cardiopulmonary disease. Electronically Signed   By: Neita Garnet M.D.   On: 12/18/2022 09:54     ASSESSMENT & PLAN: A total of 47 minutes was spent with the patient and counseling/coordination of care regarding preparing for this visit, review of most recent office visit notes, review of chest x-ray report done today, diagnosis of hypertension and cardiovascular risks associated with this condition, review of all medications, differential diagnosis of chest pain and need for cardiology evaluation, education on nutrition, prognosis, documentation and need for follow-up.  Problem List Items Addressed This Visit       Cardiovascular and Mediastinum   Essential hypertension    BP Readings from Last 3 Encounters:  12/18/22 120/70  10/15/22 124/87  10/07/22 132/86  Well-controlled hypertension with normal blood pressure readings at home Continue amlodipine 10 mg daily and Hyzaar 50-12.5 mg daily Cardiovascular risks associated with hypertension discussed Dietary approaches to stop hypertension discussed       Leukocytoclastic vasculitis     Other   Malignant neoplasm of upper-outer quadrant of left breast in female, estrogen receptor positive    Stable.  No concerns.      Morbid obesity    Diet and nutrition discussed. Advised to decrease amount of daily carbohydrate intake and daily calories and increase amount of plant-based protein in her diet.      Nonspecific chest pain - Primary    Risk factors for CAD: Hypertension, obesity, and family history Clinically stable.  Normal EKG.  Unremarkable chest x-ray without cardiomegaly or signs of CHF. Benign  physical examination.  No red flag signs or  symptoms. Differential diagnosis discussed Recommend cardiology evaluation Referral placed today.      Relevant Orders   EKG 12-Lead   Ambulatory referral to Cardiology   DG Chest 2 View (Completed)   Family history of coronary artery disease   Relevant Orders   Ambulatory referral to Cardiology   Patient Instructions  Nonspecific Chest Pain Chest pain can be caused by many different conditions. Some causes of chest pain can be life-threatening. These will require treatment right away. Serious causes of chest pain include: Heart attack. A tear in the body's main blood vessel. Redness and swelling (inflammation) around your heart. Blood clot in your lungs. Other causes of chest pain may not be so serious. These include: Heartburn. Anxiety or stress. Damage to bones or muscles in your chest. Lung infections. Chest pain can feel like: Pain or discomfort in your chest. Crushing, pressure, aching, or squeezing pain. Burning or tingling. Dull or sharp pain that is worse when you move, cough, or take a deep breath. Pain or discomfort that is also felt in your back, neck, jaw, shoulder, or arm, or pain that spreads to any of these areas. It is hard to know whether your pain is caused by something that is serious or something that is not so serious. So it is important to see your doctor right away if you have chest pain. Follow these instructions at home: Medicines Take over-the-counter and prescription medicines only as told by your doctor. If you were prescribed an antibiotic medicine, take it as told by your doctor. Do not stop taking the antibiotic even if you start to feel better. Lifestyle  Rest as told by your doctor. Do not use any products that contain nicotine or tobacco, such as cigarettes, e-cigarettes, and chewing tobacco. If you need help quitting, ask your doctor. Do not drink alcohol. Make lifestyle changes as told by your  doctor. These may include: Getting regular exercise. Ask your doctor what activities are safe for you. Eating a heart-healthy diet. A diet and nutrition specialist (dietitian) can help you to learn healthy eating options. Staying at a healthy weight. Treating diabetes or high blood pressure, if needed. Lowering your stress. Activities such as yoga and relaxation techniques can help. General instructions Pay attention to any changes in your symptoms. Tell your doctor about them or any new symptoms. Avoid any activities that cause chest pain. Keep all follow-up visits as told by your doctor. This is important. You may need more testing if your chest pain does not go away. Contact a doctor if: Your chest pain does not go away. You feel depressed. You have a fever. Get help right away if: Your chest pain is worse. You have a cough that gets worse, or you cough up blood. You have very bad (severe) pain in your belly (abdomen). You pass out (faint). You have either of these for no clear reason: Sudden chest discomfort. Sudden discomfort in your arms, back, neck, or jaw. You have shortness of breath at any time. You suddenly start to sweat, or your skin gets clammy. You feel sick to your stomach (nauseous). You throw up (vomit). You suddenly feel lightheaded or dizzy. You feel very weak or tired. Your heart starts to beat fast, or it feels like it is skipping beats. These symptoms may be an emergency. Do not wait to see if the symptoms will go away. Get medical help right away. Call your local emergency services (911 in the [place] Do not drive yourself to the  hospital. Summary Chest pain can be caused by many different conditions. The cause may be serious and need treatment right away. If you have chest pain, see your doctor right away. Follow your doctor's instructions for taking medicines and making lifestyle changes. Keep all follow-up visits as told by your doctor. This includes visits  for any further testing if your chest pain does not go away. Be sure to know the signs that show that your condition has become worse. Get help right away if you have these symptoms. This information is not intended to replace advice given to you by your health care provider. Make sure you discuss any questions you have with your health care provider. Document Revised: 07/03/2022 Document Reviewed: 07/03/2022 Elsevier Patient Education  2023 Elsevier Inc.     Edwina Barth, MD  Primary Care at [facility]

## 2022-12-18 NOTE — Assessment & Plan Note (Signed)
BP Readings from Last 3 Encounters:  12/18/22 120/70  10/15/22 124/87  10/07/22 132/86   Well-controlled hypertension with normal blood pressure readings at home Continue amlodipine 10 mg daily and Hyzaar 50-12.5 mg daily Cardiovascular risks associated with hypertension discussed Dietary approaches to stop hypertension discussed

## 2022-12-18 NOTE — Assessment & Plan Note (Signed)
Diet and nutrition discussed.  Advised to decrease amount of daily carbohydrate intake and daily calories and increase amount of plant-based protein in her diet. 

## 2022-12-18 NOTE — Assessment & Plan Note (Signed)
Stable.  No concerns. °

## 2022-12-19 DIAGNOSIS — G4733 Obstructive sleep apnea (adult) (pediatric): Secondary | ICD-10-CM | POA: Diagnosis not present

## 2022-12-22 ENCOUNTER — Other Ambulatory Visit: Payer: Self-pay | Admitting: Emergency Medicine

## 2022-12-22 ENCOUNTER — Other Ambulatory Visit (HOSPITAL_COMMUNITY): Payer: Self-pay

## 2022-12-22 MED ORDER — LOSARTAN POTASSIUM-HCTZ 50-12.5 MG PO TABS
1.0000 | ORAL_TABLET | Freq: Every day | ORAL | 0 refills | Status: DC
  Filled 2022-12-22: qty 30, 30d supply, fill #0

## 2022-12-22 MED ORDER — AMLODIPINE BESYLATE 10 MG PO TABS
10.0000 mg | ORAL_TABLET | Freq: Every day | ORAL | 0 refills | Status: DC
  Filled 2022-12-22: qty 30, 30d supply, fill #0

## 2022-12-24 ENCOUNTER — Other Ambulatory Visit (HOSPITAL_COMMUNITY): Payer: Self-pay

## 2023-01-04 ENCOUNTER — Encounter: Payer: Self-pay | Admitting: Podiatry

## 2023-01-05 ENCOUNTER — Other Ambulatory Visit (HOSPITAL_COMMUNITY): Payer: Self-pay

## 2023-01-05 ENCOUNTER — Telehealth (INDEPENDENT_AMBULATORY_CARE_PROVIDER_SITE_OTHER): Payer: 59 | Admitting: Podiatry

## 2023-01-05 MED ORDER — CELECOXIB 200 MG PO CAPS
200.0000 mg | ORAL_CAPSULE | Freq: Two times a day (BID) | ORAL | 1 refills | Status: DC
  Filled 2023-01-05: qty 60, 30d supply, fill #0
  Filled 2023-04-16: qty 60, 30d supply, fill #1

## 2023-01-05 NOTE — Telephone Encounter (Signed)
LVM to discontinue Mobic and start Celebrex which Dr. Al Corpus has sent to pharmacy. Mobic discontinued in chart today. Also informed patient about Cam boot and she may call to schedule appt to get fitted for Cam Boot.

## 2023-01-08 ENCOUNTER — Ambulatory Visit: Payer: 59 | Admitting: Podiatry

## 2023-01-16 DIAGNOSIS — G4733 Obstructive sleep apnea (adult) (pediatric): Secondary | ICD-10-CM | POA: Diagnosis not present

## 2023-01-21 ENCOUNTER — Ambulatory Visit: Payer: 59 | Admitting: Nurse Practitioner

## 2023-01-21 ENCOUNTER — Ambulatory Visit: Payer: 59 | Admitting: Cardiology

## 2023-01-30 ENCOUNTER — Encounter: Payer: Self-pay | Admitting: Emergency Medicine

## 2023-02-02 ENCOUNTER — Encounter: Payer: Self-pay | Admitting: Cardiology

## 2023-02-02 ENCOUNTER — Ambulatory Visit: Payer: 59 | Attending: Cardiology | Admitting: Cardiology

## 2023-02-02 VITALS — BP 126/50 | HR 99 | Ht 64.0 in | Wt 262.2 lb

## 2023-02-02 DIAGNOSIS — I10 Essential (primary) hypertension: Secondary | ICD-10-CM | POA: Diagnosis not present

## 2023-02-02 DIAGNOSIS — R29818 Other symptoms and signs involving the nervous system: Secondary | ICD-10-CM | POA: Diagnosis not present

## 2023-02-02 DIAGNOSIS — R030 Elevated blood-pressure reading, without diagnosis of hypertension: Secondary | ICD-10-CM | POA: Diagnosis not present

## 2023-02-02 DIAGNOSIS — Z8249 Family history of ischemic heart disease and other diseases of the circulatory system: Secondary | ICD-10-CM | POA: Diagnosis not present

## 2023-02-02 DIAGNOSIS — R072 Precordial pain: Secondary | ICD-10-CM

## 2023-02-02 DIAGNOSIS — E8881 Metabolic syndrome: Secondary | ICD-10-CM

## 2023-02-02 DIAGNOSIS — R0609 Other forms of dyspnea: Secondary | ICD-10-CM | POA: Diagnosis not present

## 2023-02-02 MED ORDER — NITROGLYCERIN 0.4 MG SL SUBL
0.4000 mg | SUBLINGUAL_TABLET | SUBLINGUAL | 4 refills | Status: DC | PRN
  Filled 2023-02-02: qty 25, 8d supply, fill #0

## 2023-02-02 MED ORDER — METOPROLOL TARTRATE 100 MG PO TABS
100.0000 mg | ORAL_TABLET | Freq: Once | ORAL | 0 refills | Status: DC
  Filled 2023-02-02: qty 1, 1d supply, fill #0

## 2023-02-02 NOTE — Progress Notes (Signed)
Primary Care Provider: Georgina Quint, MD [facility] Cardiologist: Erin Lemma, MD Electrophysiologist: None  Clinic Note: Chief Complaint  Patient presents with   New Patient (Initial Visit)    Episodic hypertension, chest pain    ===================================  ASSESSMENT/PLAN   Problem List Items Addressed This Visit       Cardiology Problems   Situational hypertension    Episodic hypertension as noted in HPI.  Associate with tachycardia as well which is interesting.  Would like to exclude pheochromocytoma which is unlikely.  She does have any other symptoms that would go along with carcinoid vitamin D other option to consider.  Plan to check urine metanephrines and 2D echo.      Relevant Medications   nitroGLYCERIN (NITROSTAT) 0.4 MG SL tablet   Other Relevant Orders   Metanephrines, urine, 24 hour   Essential hypertension (Chronic)    BP was totally stable 1 she saw her PCP and also normal here, but has had episodic hypertension. Plan for now will be to continue on her current dose of amlodipine 10 mg daily and losartan HCTZ 50-12.5 mg daily.  Low threshold to add beta-blocker       Relevant Medications   nitroGLYCERIN (NITROSTAT) 0.4 MG SL tablet     Other   Suspected sleep apnea    Referred to [facility] neurology last year.  Appears to be under their care.      Precordial pain - Primary (Chronic)    Clearly has risk factor for CAD including metabolic syndrome and a family history.   With obesity and exercise intolerance, not sure that a treadmill stress test would be effective, no nuclear imaging be very accurate given her obesity.  Plan: Coronary CT angiogram with possible FFR ct -> Will increase CT labs as well as pre-CT beta-blocker      Relevant Orders   ECHOCARDIOGRAM COMPLETE   CT CORONARY MORPH W/CTA COR W/SCORE W/CA W/CM &/OR WO/CM   Basic metabolic panel (Completed)   Morbid obesity (HCC) (Chronic)    Discussed  importance of weight loss and the role that weight plays in both hypertension as well as hyperlipidemia and CAD.  With hypertension, obesity, prediabetes, and elevated triglycerides, she does meet metabolic syndrome criteria.  Discussed dietary modification and the need for exercise.  Hence the need for ischemic evaluation.      Metabolic syndrome    Morbid obesity, hypertension, prediabetes (A1c 6.3), and triglycerides at 165. => This places her at higher risk for CAD.  Warrants ischemic evaluation for exertional chest pain and dyspnea.  Discussed portance of weight loss-will need to have ischemic evaluation so she would feel comfortable with exercise.      Family history of coronary artery disease (Chronic)   Relevant Orders   ECHOCARDIOGRAM COMPLETE   CT CORONARY MORPH W/CTA COR W/SCORE W/CA W/CM &/OR WO/CM   Metanephrines, urine, 24 hour   DOE (dyspnea on exertion)    Morbidly obese but usually active woman who is now noticing chest pain and shortness of breath.  Plan: Check 2D echo and Coronary CTA      Relevant Orders   ECHOCARDIOGRAM COMPLETE   CT CORONARY MORPH W/CTA COR W/SCORE W/CA W/CM &/OR WO/CM   Basic metabolic panel (Completed)    ===================================  HPI:    Erin Gallagher is a morbidly obese  52 y.o. female with history of Br Ca (s/p L Br Lmpectomy & Rx Seed implant), SLE (ANA + with leukocytoclastic vasculitis) & HTN  as well as OSA who is being seen today for the evaluation of CHEST PAIN at the request of Erin Gallagher, *.  Marland Kitchen [name] was seen by Erin Quint, MD on 12/18/2022 with complaints of intermittent episodes of left-sided chest pain over 3 weeks.  Noted dull sensation that would often radiate to the left shoulder.  Associate with nausea.  No known triggers.  Associated with nausea. => Chest x-ray revealed no active cardiopulmonary disease.  EKG was also relatively benign. BP was pretty normal 120/70 (prior visits  with BP 124/87 and 132/86 mmHg) => REFERRED TO CARDIOLOGY   Recent Hospitalizations: None  Reviewed  CV studies:    The following studies were reviewed today: (if available, images/films reviewed: From Epic Chart or Care Everywhere) No studies:  Interval History:   Erin Gallagher presents here today for evaluation noting that she is still having these chest pain episodes.  In fact this past Friday she had an episode of chest discomfort with diaphoresis nausea and feeling flushed.  She felt pale.  Her blood pressure was 156/111 with a heart rate of 103.  Follow-up blood pressure was 187/99 with heart rate 101.  Thermos later it was down to 150/104 with a heart rate of 93 and then 1 hour after that was 177/95.  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  But at baseline she notes exertional dyspnea with routine activity.  She does not really do much more than that because if she does she is concerned about having that discomfort.  She describes the chest discomfort as a squeezing tightness in her chest that can last anywhere from 30 seconds to a couple minutes.  CV Review of Symptoms (Summary):  positive for - chest pain, dyspnea on exertion, and usually exertional, relieved with rest.  Not all the time.  Can also be with stress. negative for - edema, irregular heartbeat, orthopnea, palpitations, paroxysmal nocturnal dyspnea, rapid heart rate, shortness of breath, or lightheadedness dizziness or wooziness, syncope/near syncope or TIA/amaurosis fugax.  REVIEWED OF SYSTEMS   Review of Systems  Constitutional:  Negative for malaise/fatigue and weight loss.  HENT:  Negative for nosebleeds.   Respiratory:  Negative for shortness of breath.   Gastrointestinal:  Negative for blood in stool and melena.  Genitourinary:  Negative for hematuria.   Musculoskeletal:  Positive for joint pain (L shoulder - may be involved with CP).  Neurological:  Negative for dizziness, focal weakness and weakness.  Psychiatric/Behavioral:  Negative for depression and memory loss. The patient is nervous/anxious. The patient does not have insomnia.     I have reviewed and (if needed) personally updated the patient's problem list, medications, allergies, past medical and surgical history, social and family history.   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   Personal history of radiation therapy     PAST  SURGICAL HISTORY   Past Surgical History:  Procedure Laterality Date   BREAST BIOPSY Left 05/16/2019   BREAST LUMPECTOMY Left 06/14/2019   BREAST LUMPECTOMY WITH RADIOACTIVE SEED AND SENTINEL LYMPH NODE BIOPSY Left 06/14/2019   Procedure: LEFT BREAST LUMPECTOMY WITH RADIOACTIVE SEED AND SENTINEL LYMPH NODE BIOPSY;  Surgeon: Almond Lint, MD;  Location: [facility];  Service: General;  Laterality: Left;   CESAREAN SECTION     1997   CHOLECYSTECTOMY     TUBAL LIGATION     1998    Immunization History  Administered Date(s) Administered   Influenza Inj Mdck Quad Pf 05/26/2019   Influenza,inj,Quad PF,6+ Mos 06/14/2014, 06/01/2020   Influenza-Unspecified 06/01/2015, 05/26/2022   Moderna Sars-Covid-2 Vaccination 04/06/2020, 05/04/2020   Pneumococcal Conjugate-13 02/06/2021   Td 12/03/2017    MEDICATIONS/ALLERGIES   He current Meds  Medication Sig   amLODipine (NORVASC) 10 MG tablet Take 1 tablet (10 mg total) by mouth daily.    celecoxib (CELEBREX) 200 MG capsule Take 1 capsule (200 mg total) by mouth 2 (two) times daily.   losartan-hydrochlorothiazide (HYZAAR) 50-12.5 MG tablet Take 1 tablet by mouth daily.   metoprolol tartrate (LOPRESSOR) 100 MG tablet Take 1 tablet (100 mg total) by mouth once for 1 dose. TAKE TWO HOURS PRIOR TO  SCHEDULED CARDIAC TEST.   nitroGLYCERIN (NITROSTAT) 0.4 MG SL tablet Place 1 tablet (0.4 mg total) under the tongue every 5 (five) minutes as needed for chest pain.   tamoxifen (NOLVADEX) 20 MG tablet TAKE 1 TABLET BY MOUTH ONCE DAILY   VITAMIN D PO Take 1,000 Units by mouth daily.    Allergies  Allergen Reactions   Other Rash    Steri Strips cause itching and a rash    SOCIAL HISTORY/FAMILY HISTORY   Reviewed in Epic:   Social History   Tobacco Use   Smoking status: Never    Passive exposure: Current   Smokeless tobacco: Never  Vaping Use   Vaping Use: Never used  Substance Use Topics   Alcohol use: Yes    Alcohol/week: 2.0 standard drinks of alcohol    Types: 2 Standard drinks or equivalent per week    Comment: occasionally   Drug use: No   Social History   Social History Narrative   Caffiene 1 cup coffee daily.    Lives home with spouse   Works at [employer]   Education 46yrs   Children one.    Family History  Problem Relation Age of Onset   Hypertension Mother    Hypertension Father    Colon cancer Father        dx 3s   Throat cancer Maternal Uncle    Lung cancer Paternal Uncle    Stroke Maternal Grandmother    Hypertension Maternal Grandmother    Aneurysm Maternal Grandmother    Healthy Son     [place] -PE, EKG, labs   Wt Readings from Last 3 Encounters:  02/02/23 262 lb 3.2 oz (118.9 kg)  12/18/22 259 lb (117.5 kg)  10/07/22 266 lb (120.7 kg)    Physical Exam: BP (!) 126/50 (BP Location: Left Arm, Patient Position: Sitting, Cuff Size: Large)   Pulse 99   Ht 5\' 4"  (1.626 m)   Wt 262 lb 3.2 oz (118.9 kg)   LMP 12/30/2021   SpO2 97%    BMI 45.01 kg/m  Physical Exam Vitals reviewed.  Constitutional:      General: She is not in acute distress.    Appearance:  She is obese. She is not ill-appearing or toxic-appearing.  HENT:     Head: Normocephalic and atraumatic.  Cardiovascular:     Rate and Rhythm: Normal rate and regular rhythm.     Pulses: Normal pulses.     Heart sounds: No murmur heard.    No friction rub. No gallop.  Pulmonary:     Effort: Pulmonary effort is normal. No respiratory distress.     Breath sounds: Normal breath sounds. No wheezing.  Chest:     Chest wall: No tenderness.  Abdominal:     General: Abdomen is flat. There is no distension.     Palpations: Abdomen is soft.     Tenderness: There is no abdominal tenderness.     Comments: Obese, but otherwise soft/NT/ND  Musculoskeletal:        General: Swelling (Trivial) present.  Skin:    General: Skin is warm and dry.  Neurological:     General: No focal deficit present.     Mental Status: She is alert and oriented to person, place, and time.     Gait: Gait normal.  Psychiatric:        Mood and Affect: Mood normal.        Behavior: Behavior normal.        Thought Content: Thought content normal.        Judgment: Judgment normal.      Adult ECG Report -> 12/18/2022 at PCP office  Rate: 80;  Rhythm: normal sinus rhythm and ~borderline criteria for left atrial enlargement and RSR'; otherwise normal axis, intervals and durations. ;   Narrative Interpretation: Reviewed  Recent Labs: No recent labs. Lab Results  Component Value Date   CHOL 167 10/30/2021   HDL 53.50 10/30/2021   LDLCALC 80 10/30/2021   TRIG 165.0 (H) 10/30/2021   CHOLHDL 3 10/30/2021   Lab Results  Component Value Date   CREATININE 0.75 02/06/2023   BUN 8 02/06/2023   NA 142 02/06/2023   K 4.1 02/06/2023   CL 104 02/06/2023   CO2 22 02/06/2023      Latest Ref Rng & Units 01/17/2022    8:28 AM 10/30/2021    2:31 PM 06/01/2020   10:12 AM  CBC  WBC 4.0 - 10.5 K/uL  6.3  6.3  6.7   Hemoglobin 12.0 - 15.0 g/dL 84.6  96.2  95.2   Hematocrit 36.0 - 46.0 % 41.8  40.4  41.8   Platelets 150.0 - 400.0 K/uL 278.0  289.0  339     Lab Results  Component Value Date   HGBA1C 6.3 10/30/2021   Lab Results  Component Value Date   TSH 1.59 10/30/2021    ================================================== I spent a total of 19 minutes with the patient spent in direct patient consultation.  Additional time spent with chart review  / charting (studies, outside notes, etc): 22 min Total Time: 41 min  Current medicines are reviewed at length with the patient today.  (+/- concerns) none  Notice: This dictation was prepared with Dragon dictation along with smart phrase technology. Any transcriptional errors that result from this process are unintentional and may not be corrected upon review.   Studies Ordered:  Orders Placed This Encounter  Procedures   CT CORONARY MORPH W/CTA COR W/SCORE W/CA W/CM &/OR WO/CM   Basic metabolic panel   Metanephrines, urine, 24 hour   ECHOCARDIOGRAM COMPLETE   Meds ordered this encounter  Medications   nitroGLYCERIN (NITROSTAT) 0.4 MG SL tablet  Sig: Place 1 tablet (0.4 mg total) under the tongue every 5 (five) minutes as needed for chest pain.    Dispense:  25 tablet    Refill:  4   metoprolol tartrate (LOPRESSOR) 100 MG tablet    Sig: Take 1 tablet (100 mg total) by mouth once for 1 dose. TAKE TWO HOURS PRIOR TO  SCHEDULED CARDIAC TEST.    Dispense:  1 tablet    Refill:  0    Patient Instructions / Medication Changes & Studies & Tests Ordered   Patient Instructions  Medication Instructions:   Metoprolol tartrate 100 mg   one time dose  *If you need a refill on your cardiac medications before your next appointment, please call your pharmacy*   Lab Work: BMP    24 - Urine  metanephrines If you have labs (blood work) drawn today and your tests are completely normal, you will receive your results only by: MyChart  Message (if you have MyChart) OR A paper copy in the mail If you have any lab test that is abnormal or we need to change your treatment, we will call you to review the results.   Testing/Procedures: Will be schedule at [facility]  Your physician has requested that you have an echocardiogram. Echocardiography is a painless test that uses sound waves to create images of your heart. It provides your doctor with information about the size and shape of your heart and how well your heart's chambers and valves are working. This procedure takes approximately one hour. There are no restrictions for this procedure. Please do NOT wear cologne, perfume, aftershave, or lotions (deodorant is allowed). Please arrive 15 minutes prior to your appointment time.   And Schedule at [facility] - Radiology  Your physician has requested that you have coronary  CTA. Coronary computed tomography (CT)angiogram  is a special type of CT scan that uses a computer to produce multi-dimensional views of major blood vessels throughout the heart.  CT angiography, a contrast material is injected through an IV to help visualize the blood vessels  a painless test that uses an x-ray machine to take clear, detailed pictures of your heart arteries .  Please follow instruction sheet as given.    Follow-Up: At [facility], you and your health needs are our priority.  As part of our continuing mission to provide you with exceptional heart care, we have created designated Provider Care Teams.  These Care Teams include your primary Cardiologist (physician) and Advanced Practice Providers (APPs -  Physician Assistants and Nurse Practitioners) who all work together to provide you with the care you need, when you need it.     Your next appointment:   3 month(s)  The format for your next appointment:   In Person  Provider:   Bryan Lemma, MD    Other Instructions .   Your cardiac CT will be scheduled at the below  location:   [facility] [address] [place], [place] [address] [phone]      Marykay Lex, MD, MS Erin Gallagher, M.D., M.S. Interventional Cardiologist  [facility]  Pager # [phone] Phone # [phone] [address] [place], [place] [address]   Thank you for choosing [facility] [place]!!

## 2023-02-02 NOTE — Patient Instructions (Addendum)
Medication Instructions:   Metoprolol tartrate 100 mg   one time dose  *If you need a refill on your cardiac medications before your next appointment, please call your pharmacy*   Lab Work: BMP    24 - Urine  metanephrines If you have labs (blood work) drawn today and your tests are completely normal, you will receive your results only by: MyChart Message (if you have MyChart) OR A paper copy in the mail If you have any lab test that is abnormal or we need to change your treatment, we will call you to review the results.   Testing/Procedures: Will be schedule at [facility]  Your physician has requested that you have an echocardiogram. Echocardiography is a painless test that uses sound waves to create images of your heart. It provides your doctor with information about the size and shape of your heart and how well your heart's chambers and valves are working. This procedure takes approximately one hour. There are no restrictions for this procedure. Please do NOT wear cologne, perfume, aftershave, or lotions (deodorant is allowed). Please arrive 15 minutes prior to your appointment time.   And Schedule at [facility] - Radiology  Your physician has requested that you have coronary  CTA. Coronary computed tomography (CT)angiogram  is a special type of CT scan that uses a computer to produce multi-dimensional views of major blood vessels throughout the heart.  CT angiography, a contrast material is injected through an IV to help visualize the blood vessels  a painless test that uses an x-ray machine to take clear, detailed pictures of your heart arteries .  Please follow instruction sheet as given.    Follow-Up: At [facility], you and your health needs are our priority.  As part of our continuing mission to provide you with exceptional heart care, we have created designated Provider Care Teams.  These Care Teams include your primary Cardiologist (physician) and Advanced Practice  Providers (APPs -  Physician Assistants and Nurse Practitioners) who all work together to provide you with the care you need, when you need it.     Your next appointment:   3 month(s)  The format for your next appointment:   In Person  Provider:   Bryan Lemma, MD    Other Instructions .   Your cardiac CT will be scheduled at the below location:   [facility] [address] [place], [place] [address] [phone]   Please arrive at the [facility] and Children's Entrance (Entrance C2) of [facility] 30 minutes prior to test start time. You can use the FREE valet parking offered at entrance C (encouraged to control the heart rate for the test)  Proceed to the [facility] Radiology Department (first floor) to check-in and test prep.  All radiology patients and guests should use entrance C2 at [facility], accessed from [facility], even though the hospital's physical address listed is [address]    If scheduled at [facility] or [facility], please arrive 15 mins early for check-in and test prep.   Please follow these instructions carefully (unless otherwise directed):  Hold all erectile dysfunction medications at least 3 days (72 hrs) prior to test. (Ie viagra, cialis, sildenafil, tadalafil, etc) We will administer nitroglycerin during this exam.   On the Night Before the Test: Be sure to Drink plenty of water. Do not consume any caffeinated/decaffeinated beverages or chocolate 12 hours prior to your  test. Do not take any antihistamines 12 hours prior to your test.   On the Day of the Test: Drink plenty of water until 1 hour prior to the test. Do not eat any food 1 hour prior to test. You may take your regular medications prior to the test.  Take metoprolol (Lopressor) 100 mg  two hours prior to test. If you take losartan-Hydrochlorothiazide please HOLD on the morning of  the test. FEMALES- please wear underwire-free bra if available, avoid dresses & tight clothing           After the Test: Drink plenty of water. After receiving IV contrast, you may experience a mild flushed feeling. This is normal. On occasion, you may experience a mild rash up to 24 hours after the test. This is not dangerous. If this occurs, you can take Benadryl 25 mg and increase your fluid intake. If you experience trouble breathing, this can be serious. If it is severe call 911 IMMEDIATELY. If it is mild, please call our office.   We will call to schedule your test 2-4 weeks out understanding that some insurance companies will need an authorization prior to the service being performed.   For non-scheduling related questions, please contact the cardiac imaging nurse navigator should you have any questions/concerns: Rockwell Alexandria, Cardiac Imaging Nurse Navigator Larey Brick, Cardiac Imaging Nurse Navigator [facility] Direct Office Dial: [phone]   For scheduling needs, including cancellations and rescheduling, please call [place], [phone].

## 2023-02-03 ENCOUNTER — Other Ambulatory Visit (HOSPITAL_COMMUNITY): Payer: Self-pay

## 2023-02-06 DIAGNOSIS — R072 Precordial pain: Secondary | ICD-10-CM | POA: Diagnosis not present

## 2023-02-06 DIAGNOSIS — R0609 Other forms of dyspnea: Secondary | ICD-10-CM | POA: Diagnosis not present

## 2023-02-07 LAB — BASIC METABOLIC PANEL
BUN/Creatinine Ratio: 11 (ref 9–23)
BUN: 8 mg/dL (ref 6–24)
CO2: 22 mmol/L (ref 20–29)
Calcium: 9.1 mg/dL (ref 8.7–10.2)
Chloride: 104 mmol/L (ref 96–106)
Creatinine, Ser: 0.75 mg/dL (ref 0.57–1.00)
Glucose: 98 mg/dL (ref 70–99)
Potassium: 4.1 mmol/L (ref 3.5–5.2)
Sodium: 142 mmol/L (ref 134–144)
eGFR: 96 mL/min/{1.73_m2} (ref >59)

## 2023-02-08 ENCOUNTER — Encounter: Payer: Self-pay | Admitting: Cardiology

## 2023-02-08 DIAGNOSIS — E8881 Metabolic syndrome: Secondary | ICD-10-CM | POA: Insufficient documentation

## 2023-02-08 NOTE — Assessment & Plan Note (Signed)
Episodic hypertension as noted in HPI.  Associate with tachycardia as well which is interesting.  Would like to exclude pheochromocytoma which is unlikely.  She does have any other symptoms that would go along with carcinoid vitamin D other option to consider.  Plan to check urine metanephrines and 2D echo.

## 2023-02-08 NOTE — Assessment & Plan Note (Signed)
Discussed importance of weight loss and the role that weight plays in both hypertension as well as hyperlipidemia and CAD.  With hypertension, obesity, prediabetes, and elevated triglycerides, she does meet metabolic syndrome criteria.  Discussed dietary modification and the need for exercise.  Hence the need for ischemic evaluation.

## 2023-02-08 NOTE — Assessment & Plan Note (Signed)
Referred to [facility] neurology last year.  Appears to be under their care.

## 2023-02-08 NOTE — Assessment & Plan Note (Signed)
BP was totally stable 1 she saw her PCP and also normal here, but has had episodic hypertension. Plan for now will be to continue on her current dose of amlodipine 10 mg daily and losartan HCTZ 50-12.5 mg daily.  Low threshold to add beta-blocker

## 2023-02-08 NOTE — Assessment & Plan Note (Signed)
Clearly has risk factor for CAD including metabolic syndrome and a family history.   With obesity and exercise intolerance, not sure that a treadmill stress test would be effective, no nuclear imaging be very accurate given her obesity.  Plan: Coronary CT angiogram with possible FFR ct -> Will increase CT labs as well as pre-CT beta-blocker

## 2023-02-08 NOTE — Assessment & Plan Note (Deleted)
Discussed importance of weight loss and the role that weight plays in both hypertension as well as hyperlipidemia and CAD.  With hypertension, obesity, prediabetes, and elevated triglycerides, she does meet metabolic syndrome criteria.

## 2023-02-08 NOTE — Assessment & Plan Note (Signed)
Morbid obesity, hypertension, prediabetes (A1c 6.3), and triglycerides at 165. => This places her at higher risk for CAD.  Warrants ischemic evaluation for exertional chest pain and dyspnea.  Discussed portance of weight loss-will need to have ischemic evaluation so she would feel comfortable with exercise.

## 2023-02-08 NOTE — Assessment & Plan Note (Signed)
Morbidly obese but usually active woman who is now noticing chest pain and shortness of breath.  Plan: Check 2D echo and Coronary CTA

## 2023-02-09 ENCOUNTER — Other Ambulatory Visit: Payer: Self-pay

## 2023-02-09 ENCOUNTER — Other Ambulatory Visit (HOSPITAL_COMMUNITY): Payer: Self-pay

## 2023-02-09 ENCOUNTER — Other Ambulatory Visit: Payer: Self-pay | Admitting: Emergency Medicine

## 2023-02-09 DIAGNOSIS — Z8249 Family history of ischemic heart disease and other diseases of the circulatory system: Secondary | ICD-10-CM | POA: Diagnosis not present

## 2023-02-09 DIAGNOSIS — R030 Elevated blood-pressure reading, without diagnosis of hypertension: Secondary | ICD-10-CM | POA: Diagnosis not present

## 2023-02-09 MED ORDER — LOSARTAN POTASSIUM-HCTZ 50-12.5 MG PO TABS
1.0000 | ORAL_TABLET | Freq: Every day | ORAL | 1 refills | Status: DC
  Filled 2023-02-09: qty 90, 90d supply, fill #0

## 2023-02-09 MED ORDER — AMLODIPINE BESYLATE 10 MG PO TABS
10.0000 mg | ORAL_TABLET | Freq: Every day | ORAL | 1 refills | Status: DC
  Filled 2023-02-09: qty 90, 90d supply, fill #0

## 2023-02-10 ENCOUNTER — Telehealth (HOSPITAL_COMMUNITY): Payer: Self-pay | Admitting: Emergency Medicine

## 2023-02-10 ENCOUNTER — Other Ambulatory Visit (HOSPITAL_COMMUNITY): Payer: Self-pay

## 2023-02-10 NOTE — Telephone Encounter (Signed)
Attempted to call patient regarding upcoming cardiac CT appointment. °Left message on voicemail with name and callback number °Nixon Sparr RN Navigator Cardiac Imaging °[facility] °[phone] Office °[phone] Cell ° °

## 2023-02-10 NOTE — Telephone Encounter (Signed)
Attempted to call patient regarding upcoming cardiac CT appointment. °Left message on voicemail with name and callback number °Harold Moncus RN Navigator Cardiac Imaging °[facility] °[phone] Office °[phone] Cell ° °

## 2023-02-11 ENCOUNTER — Encounter: Payer: Self-pay | Admitting: Family Medicine

## 2023-02-11 ENCOUNTER — Ambulatory Visit: Payer: 59 | Admitting: Emergency Medicine

## 2023-02-11 ENCOUNTER — Ambulatory Visit (INDEPENDENT_AMBULATORY_CARE_PROVIDER_SITE_OTHER): Payer: 59 | Admitting: Family Medicine

## 2023-02-11 ENCOUNTER — Ambulatory Visit (HOSPITAL_COMMUNITY)
Admission: RE | Admit: 2023-02-11 | Discharge: 2023-02-11 | Disposition: A | Discharge status: Home / Self Care | Payer: 59 | Source: Ambulatory Visit | Attending: Cardiology | Admitting: Cardiology

## 2023-02-11 VITALS — BP 132/90 | HR 70 | Temp 97.3°F | Ht 64.0 in | Wt 261.0 lb

## 2023-02-11 DIAGNOSIS — M5431 Sciatica, right side: Secondary | ICD-10-CM | POA: Diagnosis not present

## 2023-02-11 DIAGNOSIS — R072 Precordial pain: Secondary | ICD-10-CM | POA: Insufficient documentation

## 2023-02-11 DIAGNOSIS — R0609 Other forms of dyspnea: Secondary | ICD-10-CM | POA: Insufficient documentation

## 2023-02-11 DIAGNOSIS — Z8249 Family history of ischemic heart disease and other diseases of the circulatory system: Secondary | ICD-10-CM | POA: Insufficient documentation

## 2023-02-11 MED ORDER — NITROGLYCERIN 0.4 MG SL SUBL
0.8000 mg | SUBLINGUAL_TABLET | SUBLINGUAL | Status: DC | PRN
  Administered 2023-02-11: 0.8 mg via SUBLINGUAL

## 2023-02-11 MED ORDER — NITROGLYCERIN 0.4 MG SL SUBL
SUBLINGUAL_TABLET | SUBLINGUAL | Status: AC
  Filled 2023-02-11: qty 2

## 2023-02-11 MED ORDER — KETOROLAC TROMETHAMINE 60 MG/2ML IM SOLN
60.0000 mg | Freq: Once | INTRAMUSCULAR | Status: AC
Start: 2023-02-11 — End: 2023-02-11
  Administered 2023-02-11: 60 mg via INTRAMUSCULAR

## 2023-02-11 MED ORDER — IOHEXOL 350 MG/ML SOLN
100.0000 mL | Freq: Once | INTRAVENOUS | Status: AC | PRN
  Administered 2023-02-11: 100 mL via INTRAVENOUS

## 2023-02-11 NOTE — Progress Notes (Signed)
Subjective:     Patient ID: Erin Gallagher, female    DOB: [birth year], 52 y.o.   MRN: [id number]  Chief Complaint  Patient presents with   Flank Pain    Right side pain going on 2 weeks. Pain goes from achy to sharp, constant pain    Flank Pain Pertinent negatives include no abdominal pain, chest pain, dysuria or fever.    Discussed the use of AI scribe software for clinical note transcription with the patient, who gave verbal consent to proceed.  History of Present Illness          C/o right buttock pain shooting down her right leg x 2 weeks. No injury. Pain is worse when going from sitting to standing.    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   Personal history of radiation therapy     Past Surgical History:  Procedure Laterality Date   BREAST BIOPSY Left 05/16/2019   BREAST LUMPECTOMY Left 06/14/2019   BREAST LUMPECTOMY WITH RADIOACTIVE SEED AND SENTINEL LYMPH NODE BIOPSY Left 06/14/2019   Procedure: LEFT BREAST LUMPECTOMY WITH RADIOACTIVE SEED AND SENTINEL LYMPH NODE BIOPSY;  Surgeon: Almond Lint, MD;  Location: [facility];  Service: General;  Laterality: Left;   CESAREAN SECTION     1997   CHOLECYSTECTOMY     TUBAL LIGATION     1998    Family History  Problem Relation Age of Onset   Hypertension Mother    Hypertension Father    Colon cancer Father        dx 37s   Throat cancer Maternal Uncle    Lung cancer Paternal Uncle    Stroke Maternal  Grandmother    Hypertension Maternal Grandmother    Aneurysm Maternal Grandmother    Healthy Son     Social History   Socioeconomic History   Marital status: Married    Spouse name: Not on file   Number of children: 1   Years of education: Not on file   Highest education level: 12th grade  Occupational History   Occupation: Merchandiser, retail  Tobacco Use   Smoking status: Never    Passive exposure: Current   Smokeless tobacco: Never  Vaping Use   Vaping Use: Never used  Substance and Sexual Activity   Alcohol use: Yes    Alcohol/week: 2.0 standard drinks of alcohol    Types: 2 Standard drinks or equivalent per week    Comment: occasionally   Drug use: No   Sexual activity: Yes    Birth control/protection: Surgical    Comment: Tubal ligation  Other Topics Concern   Not on file  Social History Narrative   Caffiene 1 cup coffee daily.    Lives home with spouse   Works at [employer]   Education 28yrs   Children one.    Social Determinants of Health   Financial  Resource Strain: Medium Risk (12/15/2022)   Overall Financial Resource Strain (CARDIA)    Difficulty of Paying Living Expenses: Somewhat hard  Food Insecurity: Patient Declined (12/15/2022)   Hunger Vital Sign    Worried About Running Out of Food in the Last Year: Patient declined    Ran Out of Food in the Last Year: Patient declined  Transportation Needs: No Transportation Needs (12/15/2022)   PRAPARE - Administrator, Civil Service (Medical): No    Lack of Transportation (Non-Medical): No  Physical Activity: Insufficiently Active (12/15/2022)   Exercise Vital Sign    Days of Exercise per Week: 4 days    Minutes of Exercise per Session: 30 min  Stress: Stress Concern Present (12/15/2022)   [employer] of Occupational Health - Occupational Stress Questionnaire    Feeling of Stress : To some extent  Social Connections: Moderately Integrated (12/15/2022)   Social Connection and Isolation Panel  [NHANES]    Frequency of Communication with Friends and Family: More than three times a week    Frequency of Social Gatherings with Friends and Family: Twice a week    Attends Religious Services: 1 to 4 times per year    Active Member of [employer] or Organizations: No    Attends Engineer, structural: Not on file    Marital Status: Married  Catering manager Violence: Not on file    Outpatient Medications Prior to Visit  Medication Sig Dispense Refill   amLODipine (NORVASC) 10 MG tablet Take 1 tablet (10 mg) by mouth daily. 90 tablet 1   celecoxib (CELEBREX) 200 MG capsule Take 1 capsule (200 mg total) by mouth 2 (two) times daily. 60 capsule 1   losartan-hydrochlorothiazide (HYZAAR) 50-12.5 MG tablet Take 1 tablet by mouth daily. 90 tablet 1   nitroGLYCERIN (NITROSTAT) 0.4 MG SL tablet Place 1 tablet (0.4 mg total) under the tongue every 5 (five) minutes as needed for chest pain. 25 tablet 4   tamoxifen (NOLVADEX) 20 MG tablet TAKE 1 TABLET BY MOUTH ONCE DAILY 90 tablet 3   VITAMIN D PO Take 1,000 Units by mouth daily.     cyclobenzaprine (FLEXERIL) 10 MG tablet Take 1/2 to 1 tablet (5-10 mg total) by mouth 3 (three) times daily as needed for muscle spasms. (Patient not taking: Reported on 02/02/2023) 60 tablet 1   metoprolol tartrate (LOPRESSOR) 100 MG tablet Take 1 tablet (100 mg total) by mouth once for 1 dose. TAKE TWO HOURS PRIOR TO  SCHEDULED CARDIAC TEST. 1 tablet 0   Facility-Administered Medications Prior to Visit  Medication Dose Route Frequency Provider Last Rate Last Admin   nitroGLYCERIN (NITROSTAT) SL tablet 0.8 mg  0.8 mg Sublingual Q5 min PRN Jodelle Red, MD   0.8 mg at 02/11/23 1228    Allergies  Allergen Reactions   Other Rash    Steri Strips cause itching and a rash    Review of Systems  Constitutional:  Negative for chills and fever.  Cardiovascular:  Negative for chest pain, palpitations and leg swelling.  Gastrointestinal:  Negative for abdominal  pain, constipation, diarrhea, nausea and vomiting.  Genitourinary:  Negative for dysuria, flank pain, frequency and urgency.  Musculoskeletal:  Positive for joint pain.  Skin:  Negative for rash.  Neurological:  Negative for dizziness, sensory change and focal weakness.       Objective:    Physical Exam Constitutional:      General: She is not in acute distress.    Appearance: She is  obese. She is not ill-appearing.  Eyes:     Extraocular Movements: Extraocular movements intact.     Conjunctiva/sclera: Conjunctivae normal.  Cardiovascular:     Rate and Rhythm: Normal rate.  Pulmonary:     Effort: Pulmonary effort is normal.  Musculoskeletal:     Cervical back: Normal range of motion and neck supple.     Thoracic back: Normal.     Lumbar back: Normal.     Comments: TTP over SI joint  Skin:    General: Skin is warm and dry.  Neurological:     General: No focal deficit present.     Mental Status: She is alert and oriented to person, place, and time.     Sensory: No sensory deficit.     Motor: No weakness.     Gait: Gait abnormal.  Psychiatric:        Mood and Affect: Mood normal.        Behavior: Behavior normal.        Thought Content: Thought content normal.      BP (!) 132/90 (BP Location: Left Arm, Patient Position: Sitting, Cuff Size: Large)   Pulse 70   Temp (!) 97.3 F (36.3 C) (Temporal)   Ht 5\' 4"  (1.626 m)   Wt 261 lb (118.4 kg)   LMP 12/30/2021   SpO2 97%   BMI 44.80 kg/m  Wt Readings from Last 3 Encounters:  02/11/23 261 lb (118.4 kg)  02/02/23 262 lb 3.2 oz (118.9 kg)  12/18/22 259 lb (117.5 kg)       Assessment & Plan:   Problem List Items Addressed This Visit   None Visit Diagnoses     Sciatica of right side    -  Primary   Relevant Medications   ketorolac (TORADOL) injection 60 mg (Completed)      No acute distress.  No red flag symptoms.   Toradol 60 mg IM injection given in office.  Discussed conservative treatment.  Recommend  heating pad and stretching.  Handout was provided.  She will follow-up if any new or worsening symptoms.  I am having Erin Gallagher "VAL" maintain her VITAMIN D PO, cyclobenzaprine, tamoxifen, celecoxib, nitroGLYCERIN, metoprolol tartrate, amLODipine, and losartan-hydrochlorothiazide. We administered ketorolac.  Meds ordered this encounter  Medications   ketorolac (TORADOL) injection 60 mg

## 2023-02-11 NOTE — Patient Instructions (Addendum)
You may take Tylenol 1,000 mg this evening.   Use a heating pad.  Use a topical pain medication such as Salon Pas with lidocaine or others. Patches may be more helpful.   Start celebrex tomorrow if needed.   Do the stretches in this packet.

## 2023-02-16 DIAGNOSIS — G4733 Obstructive sleep apnea (adult) (pediatric): Secondary | ICD-10-CM | POA: Diagnosis not present

## 2023-02-17 LAB — METANEPHRINES, URINE, 24 HOUR
Metaneph Total, Ur: 89 ug/L
Metanephrines, 24H Ur: 223 ug/24 hr — ABNORMAL HIGH (ref 36–209)
Normetanephrine, 24H Ur: 908 ug/24 hr — ABNORMAL HIGH (ref 131–612)
Normetanephrine, Ur: 363 ug/L

## 2023-02-25 ENCOUNTER — Encounter: Payer: Self-pay | Admitting: Podiatry

## 2023-03-09 ENCOUNTER — Ambulatory Visit (HOSPITAL_COMMUNITY): Payer: 59 | Attending: Cardiology

## 2023-03-09 DIAGNOSIS — R072 Precordial pain: Secondary | ICD-10-CM | POA: Diagnosis not present

## 2023-03-09 DIAGNOSIS — R0609 Other forms of dyspnea: Secondary | ICD-10-CM | POA: Diagnosis not present

## 2023-03-09 DIAGNOSIS — Z8249 Family history of ischemic heart disease and other diseases of the circulatory system: Secondary | ICD-10-CM | POA: Insufficient documentation

## 2023-03-09 DIAGNOSIS — I10 Essential (primary) hypertension: Secondary | ICD-10-CM | POA: Diagnosis not present

## 2023-03-09 DIAGNOSIS — G4733 Obstructive sleep apnea (adult) (pediatric): Secondary | ICD-10-CM | POA: Diagnosis not present

## 2023-03-09 LAB — ECHOCARDIOGRAM COMPLETE
Area-P 1/2: 4.49 cm2
S' Lateral: 2.3 cm

## 2023-03-11 ENCOUNTER — Other Ambulatory Visit (HOSPITAL_COMMUNITY): Payer: Self-pay

## 2023-03-11 ENCOUNTER — Ambulatory Visit: Payer: 59 | Admitting: Emergency Medicine

## 2023-03-11 ENCOUNTER — Ambulatory Visit (INDEPENDENT_AMBULATORY_CARE_PROVIDER_SITE_OTHER): Payer: 59 | Admitting: Emergency Medicine

## 2023-03-11 ENCOUNTER — Encounter: Payer: Self-pay | Admitting: Emergency Medicine

## 2023-03-11 VITALS — BP 130/72 | HR 80 | Temp 98.4°F | Ht 64.0 in | Wt 262.0 lb

## 2023-03-11 DIAGNOSIS — M25472 Effusion, left ankle: Secondary | ICD-10-CM | POA: Diagnosis not present

## 2023-03-11 DIAGNOSIS — I10 Essential (primary) hypertension: Secondary | ICD-10-CM

## 2023-03-11 DIAGNOSIS — M25471 Effusion, right ankle: Secondary | ICD-10-CM

## 2023-03-11 DIAGNOSIS — M25474 Effusion, right foot: Secondary | ICD-10-CM

## 2023-03-11 DIAGNOSIS — M25475 Effusion, left foot: Secondary | ICD-10-CM

## 2023-03-11 MED ORDER — LOSARTAN POTASSIUM-HCTZ 100-12.5 MG PO TABS
1.0000 | ORAL_TABLET | Freq: Every day | ORAL | 3 refills | Status: DC
Start: 2023-03-11 — End: 2024-03-21
  Filled 2023-03-11: qty 90, 90d supply, fill #0
  Filled 2023-09-16: qty 90, 90d supply, fill #1
  Filled 2023-12-24: qty 90, 90d supply, fill #2

## 2023-03-11 MED ORDER — AMLODIPINE BESYLATE 5 MG PO TABS
5.0000 mg | ORAL_TABLET | Freq: Every day | ORAL | 3 refills | Status: DC
Start: 2023-03-11 — End: 2024-03-21
  Filled 2023-03-11: qty 90, 90d supply, fill #0
  Filled 2023-09-16: qty 90, 90d supply, fill #1
  Filled 2023-12-24: qty 90, 90d supply, fill #2

## 2023-03-11 NOTE — Assessment & Plan Note (Signed)
Most likely secondary to high-dose amlodipine Recommend to reduce amlodipine to 5 mg daily Low-salt diet with leg elevation recommended

## 2023-03-11 NOTE — Assessment & Plan Note (Signed)
Diet and nutrition discussed.  Advised to decrease amount of daily carbohydrate intake and daily calories and increase amount of plant-based protein in her diet. 

## 2023-03-11 NOTE — Assessment & Plan Note (Signed)
Well-controlled hypertension but having amlodipine side effects Recommend to decrease amlodipine dose to 5 mg daily and increase Hyzaar to 100-12.5 mg daily Advised to monitor blood pressure readings at home daily for the next several weeks and keep a log.  Advised to contact the office if numbers persistently abnormal Dietary approaches to stop hypertension discussed

## 2023-03-11 NOTE — Patient Instructions (Signed)
Peripheral Edema  Peripheral edema is swelling that is caused by a buildup of fluid. Peripheral edema most often affects the lower legs, ankles, and feet. It can also develop in the arms, hands, and face. The area of the body that has peripheral edema will look swollen. It may also feel heavy or warm. Your clothes may start to feel tight. Pressing on the area may make a temporary dent in your skin (pitting edema). You may not be able to move your swollen arm or leg as much as usual. There are many causes of peripheral edema. It can happen because of a complication of other conditions such as heart failure, kidney disease, or a problem with your circulation. It also can be a side effect of certain medicines or happen because of an infection. It often happens to women during pregnancy. Sometimes, the cause is not known. Follow these instructions at home: Managing pain, stiffness, and swelling  Raise (elevate) your legs while you are sitting or lying down. Move around often to prevent stiffness and to reduce swelling. Do not sit or stand for long periods of time. Do not wear tight clothing. Do not wear garters on your upper legs. Exercise your legs to get your circulation going. This helps to move the fluid back into your blood vessels, and it may help the swelling go down. Wear compression stockings as told by your health care provider. These stockings help to prevent blood clots and reduce swelling in your legs. It is important that these are the correct size. These stockings should be prescribed by your doctor to prevent possible injuries. If elastic bandages or wraps are recommended, use them as told by your health care provider. Medicines Take over-the-counter and prescription medicines only as told by your health care provider. Your health care provider may prescribe medicine to help your body get rid of excess water (diuretic). Take this medicine if you are told to take it. General  instructions Eat a low-salt (low-sodium) diet as told by your health care provider. Sometimes, eating less salt may reduce swelling. Pay attention to any changes in your symptoms. Moisturize your skin daily to help prevent skin from cracking and draining. Keep all follow-up visits. This is important. Contact a health care provider if: You have a fever. You have swelling in only one leg. You have increased swelling, redness, or pain in one or both of your legs. You have drainage or sores at the area where you have edema. Get help right away if: You have edema that starts suddenly or is getting worse, especially if you are pregnant or have a medical condition. You develop shortness of breath, especially when you are lying down. You have pain in your chest or abdomen. You feel weak. You feel like you will faint. These symptoms may be an emergency. Get help right away. Call 911. Do not wait to see if the symptoms will go away. Do not drive yourself to the hospital. Summary Peripheral edema is swelling that is caused by a buildup of fluid. Peripheral edema most often affects the lower legs, ankles, and feet. Move around often to prevent stiffness and to reduce swelling. Do not sit or stand for long periods of time. Pay attention to any changes in your symptoms. Contact a health care provider if you have edema that starts suddenly or is getting worse, especially if you are pregnant or have a medical condition. Get help right away if you develop shortness of breath, especially when lying down.   This information is not intended to replace advice given to you by your health care provider. Make sure you discuss any questions you have with your health care provider. Document Revised: 04/22/2021 Document Reviewed: 04/22/2021 Elsevier Patient Education  2024 [employer]  

## 2023-03-11 NOTE — Progress Notes (Signed)
Erin Gallagher 52 y.o.   Chief Complaint  Patient presents with   Foot Swelling    Patient is having some swelling in her ankles, feet pain,  foot pain started within the last month. Swelling is getting progressively worse, having some trouble walking at times.     HISTORY OF PRESENT ILLNESS: This is a 52 y.o. female complaining of foot and ankle swelling for the past month giving her occasional pains History hypertension on amlodipine 10 mg and Hyzaar 50-12.5 mg daily No other complaints or medical concerns today.  HPI   Prior to Admission medications   Medication Sig Start Date End Date Taking? Authorizing Provider  amLODipine (NORVASC) 10 MG tablet Take 1 tablet (10 mg) by mouth daily. 02/09/23  Yes Laurencia Roma, Eilleen Kempf, MD  celecoxib (CELEBREX) 200 MG capsule Take 1 capsule (200 mg total) by mouth 2 (two) times daily. 01/05/23  Yes Hyatt, Max T, DPM  losartan-hydrochlorothiazide (HYZAAR) 50-12.5 MG tablet Take 1 tablet by mouth daily. 02/09/23  Yes Christyne Mccain, Eilleen Kempf, MD  nitroGLYCERIN (NITROSTAT) 0.4 MG SL tablet Place 1 tablet (0.4 mg total) under the tongue every 5 (five) minutes as needed for chest pain. 02/02/23  Yes Marykay Lex, MD  tamoxifen (NOLVADEX) 20 MG tablet TAKE 1 TABLET BY MOUTH ONCE DAILY 10/23/22 10/23/23 Yes Serena Croissant, MD  VITAMIN D PO Take 1,000 Units by mouth daily.   Yes [provider]  cyclobenzaprine (FLEXERIL) 10 MG tablet Take 1/2 to 1 tablet (5-10 mg total) by mouth 3 (three) times daily as needed for muscle spasms. 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   Suspected sleep apnea 10/30/2021   BMI 40.0-44.9, adult (HCC) 02/06/2021   Morbid obesity (HCC) 02/06/2021   History of breast cancer 02/06/2021   Malignant neoplasm of upper-outer quadrant of left breast in female, 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   Arthritis 11/23/2017   Essential hypertension 06/22/2013    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   Personal history of radiation therapy     Past Surgical History:  Procedure Laterality Date   BREAST BIOPSY Left 05/16/2019   BREAST LUMPECTOMY Left 06/14/2019   BREAST LUMPECTOMY WITH RADIOACTIVE SEED AND SENTINEL LYMPH NODE BIOPSY Left 06/14/2019   Procedure: LEFT BREAST LUMPECTOMY WITH RADIOACTIVE SEED AND SENTINEL LYMPH NODE BIOPSY;  Surgeon: Almond Lint, MD;  Location: [facility];  Service: General;  Laterality: Left;   CESAREAN SECTION     1997   CHOLECYSTECTOMY     TUBAL LIGATION  1998    Social History   Socioeconomic History   Marital status: Married    Spouse name: Not on file   Number of children: 1   Years of education: Not on file   Highest education level: 12th grade  Occupational History   Occupation: Merchandiser, retail  Tobacco Use   Smoking status: Never    Passive exposure: Current   Smokeless tobacco: Never  Vaping Use   Vaping Use: Never used  Substance and Sexual Activity   Alcohol use: Yes    Alcohol/week: 2.0 standard drinks of alcohol    Types: 2 Standard drinks or equivalent per week    Comment: occasionally    Drug use: No   Sexual activity: Yes    Birth control/protection: Surgical    Comment: Tubal ligation  Other Topics Concern   Not on file  Social History Narrative   Caffiene 1 cup coffee daily.    Lives home with spouse   Works at [employer]   Education 23yrs   Children one.    Social Determinants of Health   Financial Resource Strain: Medium Risk (12/15/2022)   Overall Financial Resource Strain (CARDIA)    Difficulty of Paying Living Expenses: Somewhat hard  Food Insecurity: Patient Declined (12/15/2022)   Hunger Vital Sign    Worried About Running Out of Food in the Last Year: Patient declined    Ran Out of Food in the Last Year: Patient declined  Transportation Needs: No Transportation Needs (12/15/2022)   PRAPARE - Administrator, Civil Service (Medical): No    Lack of Transportation (Non-Medical): No  Physical Activity: Insufficiently Active (12/15/2022)   Exercise Vital Sign    Days of Exercise per Week: 4 days    Minutes of Exercise per Session: 30 min  Stress: Stress Concern Present (12/15/2022)   [employer] of Occupational Health - Occupational Stress Questionnaire    Feeling of Stress : To some extent  Social Connections: Moderately Integrated (12/15/2022)   Social Connection and Isolation Panel [NHANES]    Frequency of Communication with Friends and Family: More than three times a week    Frequency of Social Gatherings with Friends and Family: Twice a week    Attends Religious Services: 1 to 4 times per year    Active Member of [employer] or Organizations: No    Attends Engineer, structural: Not on file    Marital Status: Married  Catering manager Violence: Not on file    Family History  Problem Relation Age of Onset   Hypertension Mother    Hypertension Father    Colon cancer Father        dx 59s   Throat cancer Maternal Uncle    Lung cancer Paternal Uncle    Stroke Maternal Grandmother    Hypertension Maternal Grandmother     Aneurysm Maternal Grandmother    Healthy Son      Review of Systems  Constitutional: Negative.  Negative for chills and fever.  HENT: Negative.  Negative for congestion and sore throat.   Respiratory: Negative.  Negative for cough and shortness of breath.   Cardiovascular: Negative.  Negative for chest pain and palpitations.  Gastrointestinal:  Negative for abdominal pain, diarrhea, nausea and vomiting.  Genitourinary: Negative.   Skin: Negative.  Negative for rash.  Neurological: Negative.  Negative for dizziness and headaches.  All other systems reviewed and are negative.   Vitals:   03/11/23 1024  BP: 130/72  Pulse: 80  Temp: 98.4 F (36.9 C)  SpO2: 95%    Physical Exam Vitals reviewed.  Constitutional:      Appearance: She is obese.  HENT:     Head: Normocephalic.     Mouth/Throat:     Mouth: Mucous membranes are moist.     Pharynx: Oropharynx is clear.  Eyes:     Extraocular Movements: Extraocular movements intact.     Pupils: Pupils are equal, round, and reactive to light.  Cardiovascular:     Rate and Rhythm: Normal rate and regular rhythm.     Pulses: Normal pulses.     Heart sounds: Normal heart sounds.  Pulmonary:     Effort: Pulmonary effort is normal.     Breath sounds: Normal breath sounds.  Musculoskeletal:     Cervical back: No tenderness.     Comments: Nonpitting edema of ankles and feet Feet: Neurovascularly intact  Lymphadenopathy:     Cervical: No cervical adenopathy.  Skin:    Capillary Refill: Capillary refill takes less than 2 seconds.  Neurological:     General: No focal deficit present.     Mental Status: She is alert and oriented to person, place, and time.  Psychiatric:        Mood and Affect: Mood normal.        Behavior: Behavior normal.      ASSESSMENT & PLAN: A total of 44 minutes was spent with the patient and counseling/coordination of care regarding preparing for this visit, review of most recent office visit notes,  review of chronic medical conditions under management, review of all medications and changes made, review of most recent blood work results, review of health maintenance items, education on nutrition, cardiovascular risks associated with hypertension, prognosis, documentation, and need for follow-up.  Problem List Items Addressed This Visit       Cardiovascular and Mediastinum   Essential hypertension (Chronic)    Well-controlled hypertension but having amlodipine side effects Recommend to decrease amlodipine dose to 5 mg daily and increase Hyzaar to 100-12.5 mg daily Advised to monitor blood pressure readings at home daily for the next several weeks and keep a log.  Advised to contact the office if numbers persistently abnormal Dietary approaches to stop hypertension discussed      Relevant Medications   amLODipine (NORVASC) 5 MG tablet   losartan-hydrochlorothiazide (HYZAAR) 100-12.5 MG tablet     Other   Morbid obesity (HCC) (Chronic)    Diet and nutrition discussed Advised to decrease amount of daily carbohydrate intake and daily calories and increase amount of plant-based protein in her diet      Bilateral swelling of feet and ankles - Primary    Most likely secondary to high-dose amlodipine Recommend to reduce amlodipine to 5 mg daily Low-salt diet with leg elevation recommended      Patient Instructions  Peripheral Edema  Peripheral edema is swelling that is caused by a buildup of fluid. Peripheral edema most often affects the lower legs, ankles, and feet. It can also develop in the arms, hands, and face. The area of the body that has peripheral edema will look swollen. It may also feel heavy or warm. Your clothes may start to feel tight. Pressing on the area may make a temporary dent in your skin (pitting edema). You may not be able to move your swollen arm or leg as much as usual. There are many causes of peripheral edema. It can happen because of a complication of other  conditions such as  heart failure, kidney disease, or a problem with your circulation. It also can be a side effect of certain medicines or happen because of an infection. It often happens to women during pregnancy. Sometimes, the cause is not known. Follow these instructions at home: Managing pain, stiffness, and swelling  Raise (elevate) your legs while you are sitting or lying down. Move around often to prevent stiffness and to reduce swelling. Do not sit or stand for long periods of time. Do not wear tight clothing. Do not wear garters on your upper legs. Exercise your legs to get your circulation going. This helps to move the fluid back into your blood vessels, and it may help the swelling go down. Wear compression stockings as told by your health care provider. These stockings help to prevent blood clots and reduce swelling in your legs. It is important that these are the correct size. These stockings should be prescribed by your doctor to prevent possible injuries. If elastic bandages or wraps are recommended, use them as told by your health care provider. Medicines Take over-the-counter and prescription medicines only as told by your health care provider. Your health care provider may prescribe medicine to help your body get rid of excess water (diuretic). Take this medicine if you are told to take it. General instructions Eat a low-salt (low-sodium) diet as told by your health care provider. Sometimes, eating less salt may reduce swelling. Pay attention to any changes in your symptoms. Moisturize your skin daily to help prevent skin from cracking and draining. Keep all follow-up visits. This is important. Contact a health care provider if: You have a fever. You have swelling in only one leg. You have increased swelling, redness, or pain in one or both of your legs. You have drainage or sores at the area where you have edema. Get help right away if: You have edema that starts suddenly  or is getting worse, especially if you are pregnant or have a medical condition. You develop shortness of breath, especially when you are lying down. You have pain in your chest or abdomen. You feel weak. You feel like you will faint. These symptoms may be an emergency. Get help right away. Call 911. Do not wait to see if the symptoms will go away. Do not drive yourself to the hospital. Summary Peripheral edema is swelling that is caused by a buildup of fluid. Peripheral edema most often affects the lower legs, ankles, and feet. Move around often to prevent stiffness and to reduce swelling. Do not sit or stand for long periods of time. Pay attention to any changes in your symptoms. Contact a health care provider if you have edema that starts suddenly or is getting worse, especially if you are pregnant or have a medical condition. Get help right away if you develop shortness of breath, especially when lying down. This information is not intended to replace advice given to you by your health care provider. Make sure you discuss any questions you have with your health care provider. Document Revised: 04/22/2021 Document Reviewed: 04/22/2021 Elsevier Patient Education  2024 Elsevier Inc.   Edwina Barth, MD [facility]

## 2023-03-12 ENCOUNTER — Other Ambulatory Visit (HOSPITAL_COMMUNITY): Payer: Self-pay

## 2023-03-16 ENCOUNTER — Telehealth: Payer: Self-pay | Admitting: *Deleted

## 2023-03-16 NOTE — Telephone Encounter (Signed)
Left detail message of results per DPR.  ,result released to mychart any question may call back

## 2023-03-16 NOTE — Telephone Encounter (Signed)
-----   Message from Bryan Lemma sent at 03/15/2023  1:32 PM EDT ----- Echocardiogram results: NORMAL STUDY.  Nothing to really suggest shortness of breath.,  However it was somewhat challenging study..  Normal left ventricular size and function with an ejection fraction of 60 to 65%.  This is the extent of pump function.  Normal range is 50 to 70%). Mild left ventricular thickening but normal relaxation parameters.  Normal right ventricle size and function.  Normal right atrial pressure.  Normal aortic and mitral valves.  Bryan Lemma, MD

## 2023-03-18 DIAGNOSIS — G4733 Obstructive sleep apnea (adult) (pediatric): Secondary | ICD-10-CM | POA: Diagnosis not present

## 2023-03-19 ENCOUNTER — Other Ambulatory Visit (HOSPITAL_COMMUNITY): Payer: Self-pay

## 2023-03-19 ENCOUNTER — Encounter: Payer: Self-pay | Admitting: Podiatry

## 2023-03-19 ENCOUNTER — Ambulatory Visit: Payer: 59 | Admitting: Nurse Practitioner

## 2023-03-19 ENCOUNTER — Ambulatory Visit: Payer: 59 | Admitting: Podiatry

## 2023-03-19 VITALS — BP 135/72 | HR 81

## 2023-03-19 DIAGNOSIS — M76822 Posterior tibial tendinitis, left leg: Secondary | ICD-10-CM

## 2023-03-19 DIAGNOSIS — M76821 Posterior tibial tendinitis, right leg: Secondary | ICD-10-CM

## 2023-03-19 MED ORDER — METHYLPREDNISOLONE 4 MG PO TBPK
ORAL_TABLET | ORAL | 0 refills | Status: DC
  Filled 2023-03-19: qty 21, 6d supply, fill #0

## 2023-03-19 MED ORDER — MELOXICAM 15 MG PO TABS
15.0000 mg | ORAL_TABLET | Freq: Every day | ORAL | 0 refills | Status: DC
  Filled 2023-03-19: qty 30, 30d supply, fill #0

## 2023-03-19 NOTE — Progress Notes (Signed)
  Subjective:  Patient ID: Erin Gallagher, female    DOB: 12/09/1970,  MRN: [id number]  Chief Complaint  Patient presents with   Plantar Fasciitis    "Not too good, I think they are worse."    52 y.o. female presents with the above complaint.  Patient presents with complaint of pain in the bilateral medial ankle and rear foot area.  She says she does not have much pain on the bottom of her heels but the pain is more so on the side of her ankle and rear foot.  She says it hurts when she walks.  She has had steroid injections in the heels as well as plantar fascial brace in the past.  Nothing has seemed to help with her pain however.   Review of Systems: Negative except as noted in the HPI. Denies N/V/F/Ch.   Objective:   Vitals:   03/19/23 1102  BP: 135/72  Pulse: 81   There is no height or weight on file to calculate BMI. Constitutional Well developed. Well nourished.  Vascular Dorsalis pedis pulses palpable bilaterally. Posterior tibial pulses palpable bilaterally. Capillary refill normal to all digits.  No cyanosis or clubbing noted. Pedal hair growth normal.  Neurologic Normal speech. Oriented to person, place, and time. Epicritic sensation to light touch grossly present bilaterally.  Dermatologic Nails well groomed and normal in appearance. No open wounds. No skin lesions.  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    Radiographs: Deferred  Assessment:   1. Posterior tibialis tendinitis of both lower extremities    Plan:  Patient was evaluated and treated and all questions answered.  # Bilateral posterior tibial tendinitis right worse than left -Discussed with patient she does have evidence of posterior tibial tendinitis in the bilateral lower extremity -Do not feel she has much plantar fasciitis at this time -Recommend anti-inflammatory  modalities including meloxicam methylprednisolone -eRx for methylprednisolone 4 mg steroid taper pack take as directed for 6 days -E Rx for meloxicam 15 mg once daily for the next 30 days -Discussed use of Tri-Lock ankle brace which I believe will decrease some of her pain issues experiencing in the right ankle.  Ankle brace dispensed -Recommend good supportive shoes activity modification icing and rest -Also recommend arch support including power step orthotics.  These were dispensed to the patient at this visit.  Return in about 4 weeks (around 04/16/2023) for f/u bilateral PTTD, flatfoot.

## 2023-03-20 ENCOUNTER — Other Ambulatory Visit (HOSPITAL_COMMUNITY): Payer: Self-pay

## 2023-03-26 ENCOUNTER — Telehealth: Payer: Self-pay | Admitting: *Deleted

## 2023-03-26 DIAGNOSIS — R899 Unspecified abnormal finding in specimens from other organs, systems and tissues: Secondary | ICD-10-CM

## 2023-03-26 DIAGNOSIS — R030 Elevated blood-pressure reading, without diagnosis of hypertension: Secondary | ICD-10-CM

## 2023-03-26 NOTE — Telephone Encounter (Signed)
-----   Message from [facility] sent at 03/25/2023  1:37 PM EDT ----- Metanephrins are high. Recommend MRI abdomen for adrenal adenoma/pheochromocytoma.

## 2023-03-26 NOTE — Telephone Encounter (Signed)
Patient is aware . Ordred will be placed CT abd with and without

## 2023-03-26 NOTE — Telephone Encounter (Signed)
Per Dr Royann Shivers  03/26/23 --Sorry did some more research on the patient with abnormal metanephrines.  Probably the best and easiest test to get for pheochromocytoma is actually a CT of the abdomen with and without contrast.  Preferable to MRI.

## 2023-04-07 ENCOUNTER — Ambulatory Visit: Payer: 59 | Admitting: Podiatry

## 2023-04-09 DIAGNOSIS — G4733 Obstructive sleep apnea (adult) (pediatric): Secondary | ICD-10-CM | POA: Diagnosis not present

## 2023-04-13 ENCOUNTER — Ambulatory Visit (HOSPITAL_COMMUNITY)
Admission: RE | Admit: 2023-04-13 | Discharge: 2023-04-13 | Disposition: A | Discharge status: Home / Self Care | Payer: 59 | Source: Ambulatory Visit | Attending: Cardiology | Admitting: Cardiology

## 2023-04-13 ENCOUNTER — Other Ambulatory Visit: Payer: Self-pay | Admitting: Cardiology

## 2023-04-13 DIAGNOSIS — R030 Elevated blood-pressure reading, without diagnosis of hypertension: Secondary | ICD-10-CM | POA: Diagnosis not present

## 2023-04-13 DIAGNOSIS — K429 Umbilical hernia without obstruction or gangrene: Secondary | ICD-10-CM | POA: Diagnosis not present

## 2023-04-13 DIAGNOSIS — R899 Unspecified abnormal finding in specimens from other organs, systems and tissues: Secondary | ICD-10-CM

## 2023-04-13 DIAGNOSIS — K760 Fatty (change of) liver, not elsewhere classified: Secondary | ICD-10-CM | POA: Diagnosis not present

## 2023-04-16 ENCOUNTER — Ambulatory Visit: Payer: 59 | Admitting: Podiatry

## 2023-04-17 ENCOUNTER — Encounter: Payer: Self-pay | Admitting: Cardiology

## 2023-04-17 ENCOUNTER — Ambulatory Visit: Payer: 59 | Attending: Cardiology | Admitting: Cardiology

## 2023-04-17 VITALS — BP 132/84 | HR 86 | Ht 65.0 in | Wt 263.4 lb

## 2023-04-17 DIAGNOSIS — M25471 Effusion, right ankle: Secondary | ICD-10-CM

## 2023-04-17 DIAGNOSIS — R29818 Other symptoms and signs involving the nervous system: Secondary | ICD-10-CM | POA: Diagnosis not present

## 2023-04-17 DIAGNOSIS — I10 Essential (primary) hypertension: Secondary | ICD-10-CM | POA: Diagnosis not present

## 2023-04-17 DIAGNOSIS — R030 Elevated blood-pressure reading, without diagnosis of hypertension: Secondary | ICD-10-CM

## 2023-04-17 DIAGNOSIS — M25474 Effusion, right foot: Secondary | ICD-10-CM

## 2023-04-17 DIAGNOSIS — Z8249 Family history of ischemic heart disease and other diseases of the circulatory system: Secondary | ICD-10-CM | POA: Diagnosis not present

## 2023-04-17 DIAGNOSIS — R072 Precordial pain: Secondary | ICD-10-CM

## 2023-04-17 DIAGNOSIS — M25475 Effusion, left foot: Secondary | ICD-10-CM

## 2023-04-17 DIAGNOSIS — M25472 Effusion, left ankle: Secondary | ICD-10-CM

## 2023-04-17 DIAGNOSIS — R0609 Other forms of dyspnea: Secondary | ICD-10-CM

## 2023-04-17 NOTE — Patient Instructions (Addendum)
Medication Instructions:  Your physician recommends that you continue on your current medications as directed. Please refer to the Current Medication list given to you today.   *If you need a refill on your cardiac medications before your next appointment, please call your pharmacy*      Testing/Procedures: Your physician has requested that you have a lower venous duplex.(DVT/Reflux).  This test is an ultrasound of the veins in the legs. It looks at venous blood flow that carries blood from the heart to the legs. Allow one hour for a Lower Venous exam.  There are no restrictions or special instructions. This test is done at [address].      Follow-Up: At [facility], you and your health needs are our priority.  As part of our continuing mission to provide you with exceptional heart care, we have created designated Provider Care Teams.  These Care Teams include your primary Cardiologist (physician) and Advanced Practice Providers (APPs -  Physician Assistants and Nurse Practitioners) who all work together to provide you with the care you need, when you need it.  We recommend signing up for the patient portal called "MyChart".  Sign up information is provided on this After Visit Summary.  MyChart is used to connect with patients for Virtual Visits (Telemedicine).  Patients are able to view lab/test results, encounter notes, upcoming appointments, etc.  Non-urgent messages can be sent to your provider as well.   To learn more about what you can do with MyChart, go to [email or web address].    Your next appointment:   AS NEEDED   Provider:   Dr. Herbie Baltimore   Other Instructions Will send information to primary care provider to follow up as needed.      Elevated feet at least 20- 30 mins at least 3 to 4 times a day if possible  recommends you purchase some compression  socks/hose from [employer] in [place] ,[place]. You do not need an prescription to purchase the  items.  Address  [address] [place], [place] [address]  Phone  [phone]   Compression   strength    8-15 mmHg X15-20 mmHg                           20-30 mmHg  30-40 mmHg.  You may also try a medical supply store, department store (i.e.- [employer], [employer], [employer], specialty shoe stores ( shoe market), [employer] and [employer]) or  [employer] uniform store.

## 2023-04-17 NOTE — Progress Notes (Unsigned)
Cardiology Office Note:  .   Date:  04/19/2023  ID:  Erin Gallagher, DOB [birth year], MRN [id number] PCP: Georgina Quint, MD  [facility] Providers Cardiologist:  Bryan Lemma, MD     Chief Complaint  Patient presents with   Follow-up    Reviewed study results.  Not really having chest pain or shortness of breath.  Just mild swelling.  BP stable.    History of Present Illness: Erin Gallagher Kitchen     Erin Gallagher is a morbidly obese 52 y.o. female with a PMH notable for breast cancer (s/p L breast lumpectomy and seed implant), SLE, HTN and OSA who presents here for 14-Month Follow-Up at the request of Georgina Quint, *.  Erin Gallagher was last seen on 02/02/2023 for evaluation of precordial pain and exertional dyspnea.  She is also noted to have episodic hypertension.  Ordered urine metanephrines, Echo and Coronary CTA. => Because of borderline abnormal metanephrine levels, CT abdomen checked.    Subjective   INTERVAL HISTORY Erin Gallagher returns here today to follow-up her studies.  She actually says that her blood pressures have been doing fairly well of late and has not really been having the intermittent episodes of high blood pressures now that she has been on a stable dose of losartan-HCTZ and amlodipine.  Not really having any rapid heartbeats or palpitations or flushing symptoms.  No further episodes of chest pain or pressure just random episodes of an abnormal sensation in her chest that is not like it had been.  Not to the extent of her concern.  No PND orthopnea, but does have some R>L lower extremity swelling.  This is usually because she spends a lot of her time either on her feet or sitting.  She does often wear support stockings but does not usually have time to elevate much.  We reviewed the results of her studies noted below) reassured.  I did explain to her the hepatic steatosis can and that she would need to continue to follow this up with her PCP as well as the  elevated urine metanephrines.  If she has more flushing symptoms or heart rate BP surges, may want to consider adrenal vein sampling => would defer to PCP.  ROS:  Cardiovascular ROS: positive for - mild R>L lower extremity swelling.  Mild exertional dyspnea from deconditioning, but not concerning.  No real chest pain.  Stable BP negative for - irregular heartbeat, orthopnea, palpitations, paroxysmal nocturnal dyspnea, rapid heart rate, shortness of breath, or syncope or near syncope, TIA or amaurosis fugax claudication Review of Systems - Negative except symptoms noted above.    Objective   Studies Reviewed: Erin Gallagher Kitchen       ECHO 03/09/2023: Technically difficult study.  EF 60 to 65%.  No RWMA.  Normal RV size and function.  Normal RAP.  Coronary CTA 02/18/2023: Coronary Calcium Score 0.  Noncalcified 25 to 49% plaque in the ostial PDA.  Noncalcified 1 to 24% stenosis in the LAD.  No plaque noted in the LCx.  LOW RISK.  Urine metanephrines showed 24-hour normetanephrine 908 and overall metanephrines 223. => Recommend an CT of the abdomen CT Adrenal Abdomen 04/13/2023: Normal adrenal glands.  Marked hepatic steatosis.  Risk Assessment/Calculations:            Physical Exam:   VS:  BP 132/84   Pulse 86   Ht 5\' 5"  (1.651 m)   Wt 263 lb 6.4 oz (119.5 kg)   LMP  12/30/2021   SpO2 94%   BMI 43.83 kg/m    Wt Readings from Last 3 Encounters:  04/17/23 263 lb 6.4 oz (119.5 kg)  03/11/23 262 lb (118.8 kg)  02/11/23 261 lb (118.4 kg)    GEN: Morbidly obese.  Well nourished, well developed in no acute distress; well-groomed. NECK: No JVD; No carotid bruits CARDIAC: Normal S1, S2; RRR, no murmurs, rubs, gallops RESPIRATORY:  Clear to auscultation without rales, wheezing or rhonchi ; nonlabored, good air movement. ABDOMEN: Soft, non-tender, non-distended; obese. EXTREMITIES: Trivial edema; No deformity     ASSESSMENT AND PLAN: .    Problem List Items Addressed This Visit       Cardiology  Problems   Situational hypertension    Pretty much excluded pheochromocytoma with CT of the abdomen, but metanephrines were elevated.  Need to continue to monitor for symptoms and consider adrenal vein sampling.  Would defer to PCP.      Relevant Orders   VAS [place] LOWER EXTREMITY VENOUS REFLUX   VAS [place] LOWER EXTREMITY VENOUS (DVT)   Essential hypertension (Chronic)    BP is controlled on amlodipine.  Edema little better with the lower dose of 5 mg compared to 10.  Hyzaar increased up to 100 mg / 12.5 mg with stable pressures.  No signs of hypertensive heart disease.  No longer really having the situational blood pressure appears to or palpitations. She did have elevated urine metanephrines, but normal CT of the abdomen which would argue against pheochromocytoma.  However where she to have more concerning symptoms of flushing heart racing or for surges of blood pressure, would consider adrenal vein sampling.  Discussed importance of weight loss and exercise.        Other   Suspected sleep apnea    Needs continue to follow-up with care.      Precordial pain (Chronic)    Relatively normal Coronary CTA.  Unlikely to be related to CAD although she does have mild to moderate disease in the ostial PDA and mild disease in the LAD that is noncalcified. Likely to be cardiac in nature.      Morbid obesity (HCC) (Chronic)    Again discussed importance of diet and exercise to help with weight loss.  This will help with BP and lipids.  With morbid obesity as a diagnosis, could consider the possibility of GLP-1 agonist.  (Zepbound/Wegovy)      Family history of coronary artery disease (Chronic)    Relatively reassuring coronary CTA with Coronary Calcium Score of 0.  Noncalcified plaque noted in the ostial PDA but minimal disease in the LAD.  Would try target LDL below 100 if not closer to 70 and most recent labs from 2023 had an LDL of 80. With current Score 0, would not recommend  aspirin.  Would not be unreasonable to consider Coronary Calcium Score evaluation in 3 years to reassess.      DOE (dyspnea on exertion) (Chronic)    Morbidly obese and relatively inactive.  Nonischemic Coronary CTA mL and normal echo.  Likely related to deconditioning.      Bilateral swelling of feet and ankles - Primary (Chronic)    She says the right side is more than the left side swelling.  Agree with salt restriction.  Could potentially increase the HCTZ dose of Hyzaar to 25 mg.  To exclude venous insufficiency plus or minus DVT will check lower extremity venous Dopplers.      Relevant Orders   VAS [place]  LOWER EXTREMITY VENOUS REFLUX   VAS [place] LOWER EXTREMITY VENOUS (DVT)            Dispo: Return for Followup when necessary.  Total time spent: 21 min spent with patient + 12 min spent charting = 33 min     Signed, Marykay Lex, MD, MS Bryan Lemma, M.D., M.S. Interventional Cardiologist  [facility]  Pager # [phone] Phone # [phone] [address] [place], [place] [address]

## 2023-04-18 DIAGNOSIS — G4733 Obstructive sleep apnea (adult) (pediatric): Secondary | ICD-10-CM | POA: Diagnosis not present

## 2023-04-19 ENCOUNTER — Encounter: Payer: Self-pay | Admitting: Cardiology

## 2023-04-19 NOTE — Assessment & Plan Note (Signed)
Relatively normal Coronary CTA.  Unlikely to be related to CAD although she does have mild to moderate disease in the ostial PDA and mild disease in the LAD that is noncalcified. Likely to be cardiac in nature.

## 2023-04-19 NOTE — Assessment & Plan Note (Addendum)
Relatively reassuring coronary CTA with Coronary Calcium Score of 0.  Noncalcified plaque noted in the ostial PDA but minimal disease in the LAD.  Would try target LDL below 100 if not closer to 70 and most recent labs from 2023 had an LDL of 80. With current Score 0, would not recommend aspirin.  Would not be unreasonable to consider Coronary Calcium Score evaluation in 3 years to reassess.

## 2023-04-19 NOTE — Assessment & Plan Note (Signed)
She says the right side is more than the left side swelling.  Agree with salt restriction.  Could potentially increase the HCTZ dose of Hyzaar to 25 mg.  To exclude venous insufficiency plus or minus DVT will check lower extremity venous Dopplers.

## 2023-04-19 NOTE — Assessment & Plan Note (Signed)
Pretty much excluded pheochromocytoma with CT of the abdomen, but metanephrines were elevated.  Need to continue to monitor for symptoms and consider adrenal vein sampling.  Would defer to PCP.

## 2023-04-19 NOTE — Assessment & Plan Note (Signed)
Needs continue to follow-up with care.

## 2023-04-19 NOTE — Assessment & Plan Note (Addendum)
Again discussed importance of diet and exercise to help with weight loss.  This will help with BP and lipids.  With morbid obesity as a diagnosis, could consider the possibility of GLP-1 agonist.  (Zepbound/Wegovy)

## 2023-04-19 NOTE — Assessment & Plan Note (Signed)
Morbidly obese and relatively inactive.  Nonischemic Coronary CTA mL and normal echo.  Likely related to deconditioning.

## 2023-04-19 NOTE — Assessment & Plan Note (Addendum)
BP is controlled on amlodipine.  Edema little better with the lower dose of 5 mg compared to 10.  Hyzaar increased up to 100 mg / 12.5 mg with stable pressures.  No signs of hypertensive heart disease.  No longer really having the situational blood pressure appears to or palpitations. She did have elevated urine metanephrines, but normal CT of the abdomen which would argue against pheochromocytoma.  However where she to have more concerning symptoms of flushing heart racing or for surges of blood pressure, would consider adrenal vein sampling.  Discussed importance of weight loss and exercise.

## 2023-04-23 ENCOUNTER — Ambulatory Visit (HOSPITAL_COMMUNITY)
Admission: RE | Admit: 2023-04-23 | Discharge: 2023-04-23 | Disposition: A | Discharge status: Home / Self Care | Payer: 59 | Source: Ambulatory Visit | Attending: Cardiology | Admitting: Cardiology

## 2023-04-23 DIAGNOSIS — M25471 Effusion, right ankle: Secondary | ICD-10-CM | POA: Diagnosis not present

## 2023-04-23 DIAGNOSIS — R030 Elevated blood-pressure reading, without diagnosis of hypertension: Secondary | ICD-10-CM | POA: Diagnosis not present

## 2023-04-23 DIAGNOSIS — M25474 Effusion, right foot: Secondary | ICD-10-CM | POA: Diagnosis not present

## 2023-04-23 DIAGNOSIS — M25472 Effusion, left ankle: Secondary | ICD-10-CM

## 2023-04-23 DIAGNOSIS — M25475 Effusion, left foot: Secondary | ICD-10-CM

## 2023-04-29 ENCOUNTER — Encounter: Payer: Self-pay | Admitting: Nurse Practitioner

## 2023-04-29 ENCOUNTER — Ambulatory Visit (INDEPENDENT_AMBULATORY_CARE_PROVIDER_SITE_OTHER): Payer: 59 | Admitting: Nurse Practitioner

## 2023-04-29 ENCOUNTER — Other Ambulatory Visit (HOSPITAL_COMMUNITY)
Admission: RE | Admit: 2023-04-29 | Discharge: 2023-04-29 | Disposition: A | Discharge status: Home / Self Care | Payer: 59 | Source: Ambulatory Visit | Attending: Nurse Practitioner | Admitting: Nurse Practitioner

## 2023-04-29 VITALS — BP 128/82 | HR 68 | Ht 65.0 in | Wt 261.0 lb

## 2023-04-29 DIAGNOSIS — Z853 Personal history of malignant neoplasm of breast: Secondary | ICD-10-CM

## 2023-04-29 DIAGNOSIS — Z01419 Encounter for gynecological examination (general) (routine) without abnormal findings: Secondary | ICD-10-CM

## 2023-04-29 DIAGNOSIS — Z780 Asymptomatic menopausal state: Secondary | ICD-10-CM

## 2023-04-29 DIAGNOSIS — Z124 Encounter for screening for malignant neoplasm of cervix: Secondary | ICD-10-CM | POA: Insufficient documentation

## 2023-04-29 NOTE — Progress Notes (Signed)
   Erin Gallagher [facility] Aug 30, 1971 [id number]   History:  52 y.o. G2P0011 presents for annual exam. Postmenopausal - no HRT. LMP 07/2021. Normal pap history. 2020 ER+ left breast cancer managed with lumpectomy, radiation, and on third year of Tamoxifen. HTN managed  by PCP.   Gynecologic History Patient's last menstrual period was 12/30/2021.   Contraception/Family planning: tubal ligation Sexually active: Yes  Health Maintenance Last Pap: 12/03/2017. Results were: Normal neg HPV, 5-year repeat Last mammogram: 06/26/2022. Results were: Normal Last colonoscopy: 06/12/2022. Results were: Benign polyp, 10-year recall Last Dexa: 01/29/2022. Results were: Normal  Past medical history, past surgical history, family history and social history were all reviewed and documented in the EPIC chart. 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 Father with history of colon cancer at age 31.   ROS:  A ROS was performed and pertinent positives and negatives are included.  Exam:  Vitals:   04/29/23 1055  BP: 128/82  Pulse: 68  SpO2: 100%  Weight: 261 lb (118.4 kg)  Height: 5\' 5"  (1.651 m)    Body mass index is 43.43 kg/m.  General appearance:  Normal Thyroid:  Symmetrical, normal in size, without palpable masses or nodularity. Respiratory  Auscultation:  Clear without wheezing or rhonchi Cardiovascular  Auscultation:  Regular rate, without rubs, murmurs or gallops  Edema/varicosities:  Not grossly evident Abdominal  Soft,nontender, without masses, guarding or rebound.  Liver/spleen:  No organomegaly noted  Hernia:  None appreciated  Skin  Inspection:  Grossly normal Breasts: Examined lying and sitting.   Right: Without masses, retractions, nipple discharge or axillary adenopathy.   Left: Without masses, retractions, nipple discharge or axillary adenopathy. Genitourinary   Inguinal/mons:  Normal without inguinal adenopathy  External genitalia:  Normal appearing vulva with no masses,  tenderness, or lesions  BUS/Urethra/Skene's glands:  Normal  Vagina:  Normal appearing with normal color and discharge, no lesions  Cervix:  Normal appearing without discharge or lesions  Uterus:  Difficult to palpate due to body habitus but no gross masses or tenderness  Adnexa/parametria:     Rt: Normal in size, without masses or tenderness.   Lt: Normal in size, without masses or tenderness.  Anus and perineum: Normal  Digital rectal exam: Not indicated  Patient informed chaperone available to be present for breast and pelvic exam. Patient has requested no chaperone to be present. Patient has been advised what will be completed during breast and pelvic exam.   Assessment/Plan:  52 y.o. G2P0011 for annual exam.   Well female exam with routine gynecological exam - Education provided on SBEs, importance of preventative screenings, current guidelines, high calcium diet, regular exercise, and multivitamin daily.  Labs with PCP.   Postmenopausal - no HRT, no bleeding.   History of left breast cancer - 2020 ER+ managed with lumpectomy, radiation, and Tamoxifen (on year 3). Followed by oncology. UTD on mammogram. Normal breast exam today.   Screening for cervical cancer - Plan: Cytology - PAP( [facility]). Normal pap history.   Screening for colon cancer - 06/2022 colonoscopy. Will repeat at GI's recommended interval. Father with history of colon cancer.   Screening for osteoporosis - Normal bone density May 2023.  Return in 1 year for annual.     Olivia Mackie DNP, 11:11 AM 04/29/2023

## 2023-04-30 ENCOUNTER — Other Ambulatory Visit (HOSPITAL_COMMUNITY): Payer: Self-pay

## 2023-04-30 ENCOUNTER — Ambulatory Visit: Payer: 59 | Admitting: Podiatry

## 2023-04-30 ENCOUNTER — Encounter: Payer: Self-pay | Admitting: Podiatry

## 2023-04-30 DIAGNOSIS — S93691A Other sprain of right foot, initial encounter: Secondary | ICD-10-CM | POA: Diagnosis not present

## 2023-04-30 DIAGNOSIS — M722 Plantar fascial fibromatosis: Secondary | ICD-10-CM | POA: Diagnosis not present

## 2023-04-30 DIAGNOSIS — S86111A Strain of other muscle(s) and tendon(s) of posterior muscle group at lower leg level, right leg, initial encounter: Secondary | ICD-10-CM | POA: Diagnosis not present

## 2023-04-30 DIAGNOSIS — M76822 Posterior tibial tendinitis, left leg: Secondary | ICD-10-CM | POA: Diagnosis not present

## 2023-04-30 DIAGNOSIS — M76821 Posterior tibial tendinitis, right leg: Secondary | ICD-10-CM | POA: Diagnosis not present

## 2023-04-30 MED ORDER — CELECOXIB 200 MG PO CAPS
200.0000 mg | ORAL_CAPSULE | Freq: Two times a day (BID) | ORAL | 2 refills | Status: DC
  Filled 2023-04-30: qty 60, 30d supply, fill #0

## 2023-04-30 NOTE — Progress Notes (Signed)
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  Objective: Vital signs are stable alert oriented x 3.  Pulses are palpable.  Severe pes planovalgus bilateral plantar fasciitis bilateral posterior tibial tendon tendinitis most likely a tendon tear on the right side versus the left due to the shape of the foot.  I cannot rule out a tear of the plantar fascia on the right as well.  Assess: Plantar fasciitis bilateral possible plantar fascial tear right.  Posterior tibial tendon dysfunction with probable tear.  Plan: At this point I am refilling her Celebrex she is mostly in her good shoes at all times and we are requesting an MRI for differential diagnosis as well as surgical evaluation.

## 2023-05-01 ENCOUNTER — Other Ambulatory Visit (HOSPITAL_COMMUNITY): Payer: Self-pay

## 2023-05-05 ENCOUNTER — Encounter: Payer: Self-pay | Admitting: Podiatry

## 2023-05-06 ENCOUNTER — Encounter: Payer: Self-pay | Admitting: Nurse Practitioner

## 2023-05-07 ENCOUNTER — Other Ambulatory Visit (HOSPITAL_COMMUNITY): Payer: Self-pay

## 2023-05-07 LAB — CYTOLOGY - PAP
Adequacy: ABSENT
Comment: NEGATIVE
Diagnosis: NEGATIVE
High risk HPV: NEGATIVE

## 2023-05-10 DIAGNOSIS — G4733 Obstructive sleep apnea (adult) (pediatric): Secondary | ICD-10-CM | POA: Diagnosis not present

## 2023-05-19 DIAGNOSIS — G4733 Obstructive sleep apnea (adult) (pediatric): Secondary | ICD-10-CM | POA: Diagnosis not present

## 2023-05-27 ENCOUNTER — Encounter: Payer: Self-pay | Admitting: Podiatry

## 2023-05-30 ENCOUNTER — Ambulatory Visit
Admission: RE | Admit: 2023-05-30 | Discharge: 2023-05-30 | Disposition: A | Discharge status: Home / Self Care | Payer: 59 | Source: Ambulatory Visit | Attending: Podiatry | Admitting: Podiatry

## 2023-05-30 DIAGNOSIS — M7661 Achilles tendinitis, right leg: Secondary | ICD-10-CM | POA: Diagnosis not present

## 2023-05-30 DIAGNOSIS — S93691A Other sprain of right foot, initial encounter: Secondary | ICD-10-CM

## 2023-05-30 DIAGNOSIS — S86111A Strain of other muscle(s) and tendon(s) of posterior muscle group at lower leg level, right leg, initial encounter: Secondary | ICD-10-CM

## 2023-05-30 DIAGNOSIS — M25571 Pain in right ankle and joints of right foot: Secondary | ICD-10-CM | POA: Diagnosis not present

## 2023-06-02 ENCOUNTER — Encounter: Payer: Self-pay | Admitting: Podiatry

## 2023-06-03 ENCOUNTER — Other Ambulatory Visit: Payer: Self-pay | Admitting: Podiatry

## 2023-06-03 ENCOUNTER — Other Ambulatory Visit (HOSPITAL_COMMUNITY): Payer: Self-pay

## 2023-06-03 MED ORDER — OXYCODONE-ACETAMINOPHEN 10-325 MG PO TABS
1.0000 | ORAL_TABLET | Freq: Three times a day (TID) | ORAL | 0 refills | Status: AC | PRN
Start: 2023-06-03 — End: 2023-06-10
  Filled 2023-06-03: qty 21, 7d supply, fill #0

## 2023-06-15 ENCOUNTER — Other Ambulatory Visit (HOSPITAL_COMMUNITY): Payer: Self-pay

## 2023-06-15 ENCOUNTER — Encounter: Payer: Self-pay | Admitting: Podiatry

## 2023-06-18 DIAGNOSIS — G4733 Obstructive sleep apnea (adult) (pediatric): Secondary | ICD-10-CM | POA: Diagnosis not present

## 2023-06-23 ENCOUNTER — Encounter: Payer: Self-pay | Admitting: Podiatry

## 2023-06-29 ENCOUNTER — Other Ambulatory Visit (HOSPITAL_COMMUNITY): Payer: Self-pay

## 2023-06-29 DIAGNOSIS — M79671 Pain in right foot: Secondary | ICD-10-CM | POA: Diagnosis not present

## 2023-06-29 DIAGNOSIS — M67961 Unspecified disorder of synovium and tendon, right lower leg: Secondary | ICD-10-CM | POA: Diagnosis not present

## 2023-06-29 DIAGNOSIS — M67962 Unspecified disorder of synovium and tendon, left lower leg: Secondary | ICD-10-CM | POA: Diagnosis not present

## 2023-06-29 DIAGNOSIS — M79672 Pain in left foot: Secondary | ICD-10-CM | POA: Diagnosis not present

## 2023-06-29 MED ORDER — DICLOFENAC SODIUM 1 % EX GEL
CUTANEOUS | 0 refills | Status: DC
  Filled 2023-06-29: qty 100, 17d supply, fill #0

## 2023-07-01 ENCOUNTER — Ambulatory Visit (INDEPENDENT_AMBULATORY_CARE_PROVIDER_SITE_OTHER): Payer: 59

## 2023-07-01 ENCOUNTER — Ambulatory Visit (INDEPENDENT_AMBULATORY_CARE_PROVIDER_SITE_OTHER): Payer: 59 | Admitting: Emergency Medicine

## 2023-07-01 ENCOUNTER — Other Ambulatory Visit (HOSPITAL_COMMUNITY): Payer: Self-pay

## 2023-07-01 ENCOUNTER — Encounter: Payer: Self-pay | Admitting: Emergency Medicine

## 2023-07-01 ENCOUNTER — Other Ambulatory Visit: Payer: Self-pay | Admitting: Emergency Medicine

## 2023-07-01 VITALS — BP 136/84 | HR 84 | Temp 98.3°F | Ht 65.0 in | Wt 259.4 lb

## 2023-07-01 DIAGNOSIS — M25552 Pain in left hip: Secondary | ICD-10-CM

## 2023-07-01 DIAGNOSIS — M549 Dorsalgia, unspecified: Secondary | ICD-10-CM | POA: Diagnosis not present

## 2023-07-01 DIAGNOSIS — M5432 Sciatica, left side: Secondary | ICD-10-CM | POA: Diagnosis not present

## 2023-07-01 DIAGNOSIS — M1612 Unilateral primary osteoarthritis, left hip: Secondary | ICD-10-CM | POA: Diagnosis not present

## 2023-07-01 DIAGNOSIS — M47816 Spondylosis without myelopathy or radiculopathy, lumbar region: Secondary | ICD-10-CM | POA: Diagnosis not present

## 2023-07-01 DIAGNOSIS — Z1231 Encounter for screening mammogram for malignant neoplasm of breast: Secondary | ICD-10-CM

## 2023-07-01 MED ORDER — CYCLOBENZAPRINE HCL 10 MG PO TABS
10.0000 mg | ORAL_TABLET | Freq: Every day | ORAL | 0 refills | Status: AC
Start: 2023-07-01 — End: ?
  Filled 2023-07-01: qty 30, 30d supply, fill #0

## 2023-07-01 MED ORDER — TRAMADOL HCL 50 MG PO TABS
50.0000 mg | ORAL_TABLET | Freq: Three times a day (TID) | ORAL | 0 refills | Status: AC | PRN
Start: 2023-07-01 — End: 2023-07-06
  Filled 2023-07-01: qty 15, 5d supply, fill #0

## 2023-07-01 MED ORDER — MELOXICAM 15 MG PO TABS
15.0000 mg | ORAL_TABLET | Freq: Every day | ORAL | 0 refills | Status: AC
Start: 2023-07-01 — End: 2023-07-15
  Filled 2023-07-01: qty 14, 14d supply, fill #0

## 2023-07-01 NOTE — Patient Instructions (Signed)
Hip Pain The hip is the joint between the upper legs and the lower pelvis. The bones, cartilage, tendons, and muscles of your hip joint support your body and allow you to move around. Hip pain can range from a minor ache to severe pain in one or both of your hips. The pain may be felt on the inside of the hip joint near the groin, or on the outside near the buttocks and upper thigh. You may also have swelling or stiffness in your hip area. Follow these instructions at home: Managing pain, stiffness, and swelling     If told, put ice on the painful area. Put ice in a plastic bag. Place a towel between your skin and the bag. Leave the ice on for 20 minutes, 2-3 times a day. If told, apply heat to the affected area as often as told by your health care provider. Use the heat source that your provider recommends, such as a moist heat pack or a heating pad. Place a towel between your skin and the heat source. Leave the heat on for 20-30 minutes. If your skin turns bright red, remove the ice or heat right away to prevent skin damage. The risk of damage is higher if you cannot feel pain, heat, or cold. Activity Do exercises as told by your provider. Avoid activities that cause pain. General instructions  Take over-the-counter and prescription medicines only as told by your provider. Keep a journal of your symptoms. Write down: How often you have hip pain. The location of your pain. What the pain feels like. What makes the pain worse. Sleep with a pillow between your legs on your most comfortable side. Keep all follow-up visits. Your provider will monitor your pain and activity. Contact a health care provider if: You cannot put weight on your leg. Your pain or swelling gets worse after a week. It gets harder to walk. You have a fever. Get help right away if: You fall. You have a sudden increase in pain and swelling in your hip. Your hip is red or swollen or very tender to touch. This  information is not intended to replace advice given to you by your health care provider. Make sure you discuss any questions you have with your health care provider. Document Revised: 04/22/2022 Document Reviewed: 04/22/2022 Elsevier Patient Education  2024 [employer]  

## 2023-07-01 NOTE — Assessment & Plan Note (Addendum)
Contributing to present condition  diet and nutrition discussed Advised to the amount of daily carbohydrate intake and daily calories and increase amount of plant-based protein in her diet

## 2023-07-01 NOTE — Progress Notes (Signed)
Erin Gallagher 52 y.o.   Chief Complaint  Patient presents with   Leg Pain    Patient states she is having some left sided hip and leg pain x 2 weeks     HISTORY OF PRESENT ILLNESS: This is a 51 y.o. female complaining of left-sided hip pain radiating down the left leg for the past 2 weeks without any significant injury Denies bowel or bladder symptoms Denies abdominal pain, nausea or vomiting. No other associated symptoms Hurts to walk. No other complaints or any other medical concerns today.  HPI   Prior to Admission medications   Medication Sig Start Date End Date Taking? Authorizing Provider  amLODipine (NORVASC) 5 MG tablet Take 1 tablet (5 mg total) by mouth daily. 03/11/23  Yes Hildagarde Holleran, Eilleen Kempf, MD  celecoxib (CELEBREX) 200 MG capsule Take 1 capsule (200 mg total) by mouth 2 (two) times daily. 04/30/23  Yes Hyatt, Max T, DPM  cyclobenzaprine (FLEXERIL) 10 MG tablet Take 1/2 to 1 tablet (5-10 mg total) by mouth 3 (three) times daily as needed for muscle spasms. 10/30/21  Yes Odie Rauen, Eilleen Kempf, MD  diclofenac Sodium (VOLTAREN) 1 % GEL APPLY 2 GRAMS TO THE AFFECTED AREA(S) BY TOPICAL ROUTE 2-3 TIMES PER DAY 06/29/23  Yes   losartan-hydrochlorothiazide (HYZAAR) 100-12.5 MG tablet Take 1 tablet by mouth daily. 03/11/23  Yes Adriaan Maltese, Eilleen Kempf, MD  meloxicam (MOBIC) 15 MG tablet Take 1 tablet (15 mg total) by mouth daily. 03/19/23  Yes Standiford, Jenelle Mages, DPM  nitroGLYCERIN (NITROSTAT) 0.4 MG SL tablet Place 1 tablet (0.4 mg total) under the tongue every 5 (five) minutes as needed for chest pain. 02/02/23  Yes Marykay Lex, MD  tamoxifen (NOLVADEX) 20 MG tablet TAKE 1 TABLET BY MOUTH ONCE DAILY 10/23/22 10/23/23 Yes Serena Croissant, MD  VITAMIN D PO Take 1,000 Units by mouth daily.   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   Suspected sleep apnea 10/30/2021   BMI 40.0-44.9, adult (HCC) 02/06/2021   Morbid obesity (HCC) 02/06/2021   History of breast cancer 02/06/2021   Malignant neoplasm of upper-outer quadrant of left breast in female, 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   Arthritis 11/23/2017   Essential hypertension 06/22/2013    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   Personal history of radiation therapy     Past Surgical History:  Procedure Laterality Date   BREAST BIOPSY Left 05/16/2019   BREAST LUMPECTOMY Left 06/14/2019   BREAST LUMPECTOMY WITH RADIOACTIVE SEED AND SENTINEL LYMPH NODE BIOPSY Left  06/14/2019   Procedure: LEFT BREAST LUMPECTOMY WITH RADIOACTIVE SEED AND SENTINEL LYMPH NODE BIOPSY;  Surgeon: Almond Lint, MD;  Location: [facility];  Service: General;  Laterality: Left;   CESAREAN SECTION     1997   CHOLECYSTECTOMY     TUBAL LIGATION     1998    Social History   Socioeconomic History   Marital status: Married    Spouse name: Not on file   Number of children: 1   Years of education: Not on file   Highest education level: 12th grade  Occupational History   Occupation: Merchandiser, retail  Tobacco Use   Smoking status: Never    Passive exposure: Current    Smokeless tobacco: Never  Vaping Use   Vaping status: Never Used  Substance and Sexual Activity   Alcohol use: Yes    Alcohol/week: 2.0 standard drinks of alcohol    Types: 2 Standard drinks or equivalent per week    Comment: occasionally - 2 out of a 7 day period   Drug use: No   Sexual activity: Yes    Birth control/protection: Surgical    Comment: Tubal ligation  Other Topics Concern   Not on file  Social History Narrative   Caffiene 1 cup coffee daily.    Lives home with spouse   Works at [employer]   Education 105yrs   Children one.    Social Determinants of Health   Financial Resource Strain: Low Risk  (06/30/2023)   Overall Financial Resource Strain (CARDIA)    Difficulty of Paying Living Expenses: Not hard at all  Food Insecurity: No Food Insecurity (06/30/2023)   Hunger Vital Sign    Worried About Running Out of Food in the Last Year: Never true    Ran Out of Food in the Last Year: Never true  Transportation Needs: No Transportation Needs (06/30/2023)   PRAPARE - Administrator, Civil Service (Medical): No    Lack of Transportation (Non-Medical): No  Physical Activity: Unknown (06/30/2023)   Exercise Vital Sign    Days of Exercise per Week: Patient declined    Minutes of Exercise per Session: 30 min  Stress: Stress Concern Present (06/30/2023)   [employer] of Occupational Health - Occupational Stress Questionnaire    Feeling of Stress : To some extent  Social Connections: Unknown (06/30/2023)   Social Connection and Isolation Panel [NHANES]    Frequency of Communication with Friends and Family: More than three times a week    Frequency of Social Gatherings with Friends and Family: Twice a week    Attends Religious Services: Patient declined    Database administrator or Organizations: No    Attends Engineer, structural: Not on file    Marital Status: Married  Catering manager Violence: Not on file    Family History  Problem  Relation Age of Onset   Hypertension Mother    Hypertension Father    Colon cancer Father        dx 63s   Throat cancer Maternal Uncle    Lung cancer Paternal Uncle    Stroke Maternal Grandmother    Hypertension Maternal Grandmother    Aneurysm Maternal Grandmother    Healthy Son      Review of Systems  Constitutional: Negative.  Negative for chills and fever.  HENT: Negative.  Negative for congestion and sore throat.   Respiratory: Negative.  Negative for cough and shortness of breath.   Cardiovascular: Negative.  Negative for chest pain and palpitations.  Gastrointestinal: Negative.  Negative for abdominal pain, diarrhea, nausea and vomiting.  Genitourinary: Negative.  Negative for dysuria and hematuria.  Musculoskeletal:  Positive for back pain and joint pain.  Skin: Negative.  Negative for rash.  Neurological: Negative.  Negative for dizziness and headaches.  All other systems reviewed and are negative.   Vitals:   07/01/23 1451  BP: 136/84  Pulse: 84  Temp: 98.3 F (36.8 C)  SpO2: 92%    Physical Exam Vitals reviewed.  Constitutional:      Appearance: Normal appearance. She is obese.  HENT:     Head: Normocephalic.  Eyes:     Extraocular Movements: Extraocular movements intact.  Cardiovascular:     Rate and Rhythm: Normal rate.  Pulmonary:     Effort: Pulmonary effort is normal.  Abdominal:     Palpations: Abdomen is soft.     Tenderness: There is no abdominal tenderness.  Skin:    General: Skin is warm and dry.     Capillary Refill: Capillary refill takes less than 2 seconds.  Neurological:     General: No focal deficit present.     Mental Status: She is alert and oriented to person, place, and time.     Sensory: No sensory deficit.     Motor: No weakness.     Deep Tendon Reflexes:     Reflex Scores:      Patellar reflexes are 1+ on the right side and 1+ on the left side.      Achilles reflexes are 1+ on the right side and 1+ on the left  side. Psychiatric:        Mood and Affect: Mood normal.        Behavior: Behavior normal.      ASSESSMENT & PLAN: A total of 33 minutes was spent with the patient and counseling/coordination of care regarding preparing for this visit, review of most recent office visit notes, review of chronic medical conditions under management, review of all medications, diagnosis of sciatica and pain management, review of x-rays done today, prognosis, need for orthopedic evaluation, need for lumbar spine MRI, documentation and need for follow-up.  Problem List Items Addressed This Visit       Nervous and Auditory   Left sided sciatica    Active and affecting quality of life Recommend rest and medication Pain management discussed Recommend daily meloxicam 15 mg for 2 weeks Tramadol 50 mg as needed for pain Flexeril 10 mg at bedtime Recommend lumbar spine MRI and orthopedic evaluation Referral placed today.      Relevant Medications   meloxicam (MOBIC) 15 MG tablet   cyclobenzaprine (FLEXERIL) 10 MG tablet   traMADol (ULTRAM) 50 MG tablet   Other Relevant Orders   DG Lumbar Spine 2-3 Views   DG HIP UNILAT W OR W/O PELVIS 2-3 VIEWS LEFT   Ambulatory referral to Orthopedic Surgery   MR Lumbar Spine Wo Contrast     Other   Morbid obesity (HCC) (Chronic)    Contributing to present condition  diet and nutrition discussed Advised to the amount of daily carbohydrate intake and daily calories and increase amount of plant-based protein in her diet      Left hip pain - Primary    Tender on examination Differential diagnosis discussed Recommend x-ray today Needs orthopedic evaluation Referral placed today Pain management discussed      Relevant Medications   meloxicam (MOBIC) 15 MG tablet   cyclobenzaprine (  FLEXERIL) 10 MG tablet   traMADol (ULTRAM) 50 MG tablet   Other Relevant Orders   DG Lumbar Spine 2-3 Views   DG HIP UNILAT W OR W/O PELVIS 2-3 VIEWS LEFT   Ambulatory referral to  Orthopedic Surgery   MR Lumbar Spine Wo Contrast   Patient Instructions  Hip Pain The hip is the joint between the upper legs and the lower pelvis. The bones, cartilage, tendons, and muscles of your hip joint support your body and allow you to move around. Hip pain can range from a minor ache to severe pain in one or both of your hips. The pain may be felt on the inside of the hip joint near the groin, or on the outside near the buttocks and upper thigh. You may also have swelling or stiffness in your hip area. Follow these instructions at home: Managing pain, stiffness, and swelling     If told, put ice on the painful area. Put ice in a plastic bag. Place a towel between your skin and the bag. Leave the ice on for 20 minutes, 2-3 times a day. If told, apply heat to the affected area as often as told by your health care provider. Use the heat source that your provider recommends, such as a moist heat pack or a heating pad. Place a towel between your skin and the heat source. Leave the heat on for 20-30 minutes. If your skin turns bright red, remove the ice or heat right away to prevent skin damage. The risk of damage is higher if you cannot feel pain, heat, or cold. Activity Do exercises as told by your provider. Avoid activities that cause pain. General instructions  Take over-the-counter and prescription medicines only as told by your provider. Keep a journal of your symptoms. Write down: How often you have hip pain. The location of your pain. What the pain feels like. What makes the pain worse. Sleep with a pillow between your legs on your most comfortable side. Keep all follow-up visits. Your provider will monitor your pain and activity. Contact a health care provider if: You cannot put weight on your leg. Your pain or swelling gets worse after a week. It gets harder to walk. You have a fever. Get help right away if: You fall. You have a sudden increase in pain and  swelling in your hip. Your hip is red or swollen or very tender to touch. This information is not intended to replace advice given to you by your health care provider. Make sure you discuss any questions you have with your health care provider. Document Revised: 04/22/2022 Document Reviewed: 04/22/2022 Elsevier Patient Education  2024 Elsevier Inc.     Edwina Barth, MD [facility]

## 2023-07-01 NOTE — Assessment & Plan Note (Signed)
Tender on examination Differential diagnosis discussed Recommend x-ray today Needs orthopedic evaluation Referral placed today Pain management discussed

## 2023-07-01 NOTE — Assessment & Plan Note (Signed)
Active and affecting quality of life Recommend rest and medication Pain management discussed Recommend daily meloxicam 15 mg for 2 weeks Tramadol 50 mg as needed for pain Flexeril 10 mg at bedtime Recommend lumbar spine MRI and orthopedic evaluation Referral placed today.

## 2023-07-17 DIAGNOSIS — M67962 Unspecified disorder of synovium and tendon, left lower leg: Secondary | ICD-10-CM | POA: Diagnosis not present

## 2023-07-17 DIAGNOSIS — M67961 Unspecified disorder of synovium and tendon, right lower leg: Secondary | ICD-10-CM | POA: Diagnosis not present

## 2023-07-17 DIAGNOSIS — M79671 Pain in right foot: Secondary | ICD-10-CM | POA: Diagnosis not present

## 2023-07-17 DIAGNOSIS — M79672 Pain in left foot: Secondary | ICD-10-CM | POA: Diagnosis not present

## 2023-07-18 ENCOUNTER — Emergency Department (HOSPITAL_COMMUNITY): Payer: 59

## 2023-07-18 ENCOUNTER — Other Ambulatory Visit: Payer: Self-pay

## 2023-07-18 ENCOUNTER — Emergency Department (HOSPITAL_COMMUNITY)
Admission: EM | Admit: 2023-07-18 | Discharge: 2023-07-19 | Disposition: A | Discharge status: Home / Self Care | Payer: 59 | Attending: Emergency Medicine | Admitting: Emergency Medicine

## 2023-07-18 ENCOUNTER — Encounter (HOSPITAL_COMMUNITY): Payer: Self-pay

## 2023-07-18 DIAGNOSIS — R112 Nausea with vomiting, unspecified: Secondary | ICD-10-CM | POA: Diagnosis not present

## 2023-07-18 DIAGNOSIS — R110 Nausea: Secondary | ICD-10-CM | POA: Diagnosis not present

## 2023-07-18 DIAGNOSIS — E876 Hypokalemia: Secondary | ICD-10-CM | POA: Diagnosis not present

## 2023-07-18 DIAGNOSIS — R519 Headache, unspecified: Secondary | ICD-10-CM | POA: Diagnosis not present

## 2023-07-18 DIAGNOSIS — R42 Dizziness and giddiness: Secondary | ICD-10-CM | POA: Insufficient documentation

## 2023-07-18 LAB — CBC
HCT: 41.1 % (ref 36.0–46.0)
Hemoglobin: 13.8 g/dL (ref 12.0–15.0)
MCH: 27 pg (ref 26.0–34.0)
MCHC: 33.6 g/dL (ref 30.0–36.0)
MCV: 80.4 fL (ref 80.0–100.0)
Platelets: 229 10*3/uL (ref 150–400)
RBC: 5.11 MIL/uL (ref 3.87–5.11)
RDW: 13.7 % (ref 11.5–15.5)
WBC: 9.6 10*3/uL (ref 4.0–10.5)
nRBC: 0 % (ref 0.0–0.2)

## 2023-07-18 LAB — BASIC METABOLIC PANEL
Anion gap: 13 (ref 5–15)
BUN: 12 mg/dL (ref 6–20)
CO2: 17 mmol/L — ABNORMAL LOW (ref 22–32)
Calcium: 9 mg/dL (ref 8.9–10.3)
Chloride: 106 mmol/L (ref 98–111)
Creatinine, Ser: 0.73 mg/dL (ref 0.44–1.00)
GFR, Estimated: >60 mL/min (ref >60)
Glucose, Bld: 128 mg/dL — ABNORMAL HIGH (ref 70–99)
Potassium: 3.2 mmol/L — ABNORMAL LOW (ref 3.5–5.1)
Sodium: 136 mmol/L (ref 135–145)

## 2023-07-18 MED ORDER — MORPHINE SULFATE (PF) 4 MG/ML IV SOLN
4.0000 mg | Freq: Once | INTRAVENOUS | Status: AC
  Administered 2023-07-18: 4 mg via INTRAVENOUS
  Filled 2023-07-18: qty 1

## 2023-07-18 MED ORDER — ONDANSETRON HCL 4 MG/2ML IJ SOLN
4.0000 mg | Freq: Once | INTRAMUSCULAR | Status: AC
  Administered 2023-07-18: 4 mg via INTRAVENOUS
  Filled 2023-07-18: qty 2

## 2023-07-18 MED ORDER — HYDROMORPHONE HCL 1 MG/ML IJ SOLN
0.5000 mg | Freq: Once | INTRAMUSCULAR | Status: AC
  Administered 2023-07-18: 0.5 mg via INTRAVENOUS
  Filled 2023-07-18: qty 1

## 2023-07-18 MED ORDER — METOCLOPRAMIDE HCL 5 MG/ML IJ SOLN
10.0000 mg | Freq: Once | INTRAMUSCULAR | Status: AC
  Administered 2023-07-18: 10 mg via INTRAVENOUS
  Filled 2023-07-18: qty 2

## 2023-07-18 NOTE — Discharge Instructions (Addendum)
It was our pleasure to provide your ER care today - we hope that you feel better. Your testing is for serious cause of your headache.  No signs of bleeding, aneurysm, stroke.  Follow-up with the neurologist for further evaluation of your headache.  Drink plenty of fluids/stay well hydrated.   Follow up closely with primary care doctor this coming week.  Return to ER if worse, new symptoms, fevers, new/severe pain, persistent vomiting, or other concern.  You were given pain medication in the ER - no driving for the next 6 hours.

## 2023-07-18 NOTE — ED Triage Notes (Signed)
Pt c/o increasing L side headache and dizziness starting this afternoon.  Pain score 10/10.  Pt was watching husband paint when headache started.    Denies Hx of migraines.

## 2023-07-18 NOTE — ED Provider Notes (Signed)
[facility] AT [facility] Provider Note   CSN: [id number] Arrival date & time: 07/18/23  1733     History  Chief Complaint  Patient presents with   Headache   Dizziness    Erin Gallagher is a 52 y.o. female.  Pt c/o acute onset headache this afternoon. Was in yard, leaves burning (but not in middle of smoky area), and was about to paint something, when c/o acute left-sided headache, constant, dull.  Pt became very upset, hyperventilating/anxious appearing - currently limiting history. No hx frequent headaches or severe headaches. No hx migraines. No eye pain or change in vision. No neck pain or stiffness. No vomiting. No specific exacerbating or alleviating factors. No recent uri symptoms, sinus congestion or pain. No syncope, trauma or head injury. No numbness/weakness. No fever/chills. Spouse had to assist from yard into house. Pt also notes persistent nausea and dizziness. No hx vertigo. No ear pain, tinnitus or hearing loss.  No recent uri symptoms.   The history is provided by the patient, medical records and the spouse.  Headache Associated symptoms: dizziness and nausea   Associated symptoms: no abdominal pain, no congestion, no cough, no eye pain, no fever, no neck pain, no neck stiffness, no numbness, no sore throat, no vomiting and no weakness   Dizziness Associated symptoms: headaches and nausea   Associated symptoms: no chest pain, no shortness of breath, no vomiting and no weakness        Home Medications Prior to Admission medications   Medication Sig Start Date End Date Taking? Authorizing Provider  amLODipine (NORVASC) 5 MG tablet Take 1 tablet (5 mg total) by mouth daily. 03/11/23   Georgina Quint, MD  cyclobenzaprine (FLEXERIL) 10 MG tablet Take 1 tablet (10 mg total) by mouth at bedtime. 07/01/23   Georgina Quint, MD  diclofenac Sodium (VOLTAREN) 1 % GEL APPLY 2 GRAMS TO THE AFFECTED AREA(S) BY TOPICAL ROUTE 2-3  TIMES PER DAY 06/29/23     losartan-hydrochlorothiazide (HYZAAR) 100-12.5 MG tablet Take 1 tablet by mouth daily. 03/11/23   Georgina Quint, MD  nitroGLYCERIN (NITROSTAT) 0.4 MG SL tablet Place 1 tablet (0.4 mg total) under the tongue every 5 (five) minutes as needed for chest pain. 02/02/23   Marykay Lex, MD  tamoxifen (NOLVADEX) 20 MG tablet TAKE 1 TABLET BY MOUTH ONCE DAILY 10/23/22 10/23/23  Serena Croissant, MD  VITAMIN D PO Take 1,000 Units by mouth daily.    [provider]      Allergies    Other    Review of Systems   Review of Systems  Constitutional:  Negative for chills and fever.  HENT:  Negative for congestion, sinus pain and sore throat.   Eyes:  Negative for pain, redness and visual disturbance.  Respiratory:  Negative for cough and shortness of breath.   Cardiovascular:  Negative for chest pain.  Gastrointestinal:  Positive for nausea. Negative for abdominal pain and vomiting.  Genitourinary:  Negative for dysuria and flank pain.  Musculoskeletal:  Negative for neck pain and neck stiffness.  Skin:  Negative for rash.  Neurological:  Positive for dizziness and headaches. Negative for syncope, speech difficulty, weakness and numbness.  Hematological:  Does not bruise/bleed easily.  Psychiatric/Behavioral:  Negative for confusion.     Physical Exam Updated Vital Signs BP 136/73 (BP Location: Right Arm)   Pulse 76   Temp (!) 97.3 F (36.3 C) (Oral)   Resp 15   Ht  1.651 m (5\' 5" )   Wt 117.5 kg   LMP 12/30/2021   SpO2 100%   BMI 43.10 kg/m  Physical Exam Vitals and nursing note reviewed.  Constitutional:      Appearance: Normal appearance. She is well-developed.  HENT:     Head: Atraumatic.     Comments: No sinus or temporal tenderness. No mastoid tenderness.     Right Ear: Tympanic membrane normal.     Left Ear: Tympanic membrane normal.     Nose: Nose normal.     Mouth/Throat:     Mouth: Mucous membranes are moist.  Eyes:     General: No  scleral icterus.    Extraocular Movements: Extraocular movements intact.     Conjunctiva/sclera: Conjunctivae normal.     Pupils: Pupils are equal, round, and reactive to light.  Neck:     Vascular: No carotid bruit.     Trachea: No tracheal deviation.     Comments: No stiffness or rigidity.  Cardiovascular:     Rate and Rhythm: Normal rate and regular rhythm.     Pulses: Normal pulses.     Heart sounds: Normal heart sounds. No murmur heard.    No friction rub. No gallop.  Pulmonary:     Effort: Pulmonary effort is normal. No respiratory distress.     Breath sounds: Normal breath sounds.  Abdominal:     General: There is no distension.     Palpations: Abdomen is soft.     Tenderness: There is no abdominal tenderness.  Genitourinary:    Comments: No cva tenderness.  Musculoskeletal:        General: No swelling.     Cervical back: Normal range of motion and neck supple. No rigidity or tenderness. No muscular tenderness.  Skin:    General: Skin is warm and dry.     Findings: No rash.  Neurological:     Mental Status: She is alert.     Comments: Alert, speech normal. No dysarthria or aphasia. Motor/sens grossly intact bil. Stre 5/5. Sens grossly intact. No pronator drift.  Gait not tested (pt indicates feels too bad, nauseated, dizzy, to stand/walk).  Psychiatric:     Comments: Very anxious appearing. Hyperventilating.      ED Results / Procedures / Treatments   Labs (all labs ordered are listed, but only abnormal results are displayed) Results for orders placed or performed during the hospital encounter of 07/18/23  Basic metabolic panel  Result Value Ref Range   Sodium 136 135 - 145 mmol/L   Potassium 3.2 (L) 3.5 - 5.1 mmol/L   Chloride 106 98 - 111 mmol/L   CO2 17 (L) 22 - 32 mmol/L   Glucose, Bld 128 (H) 70 - 99 mg/dL   BUN 12 6 - 20 mg/dL   Creatinine, Ser 6.04 0.44 - 1.00 mg/dL   Calcium 9.0 8.9 - 54.0 mg/dL   GFR, Estimated >98 >11 mL/min   Anion gap 13 5 - 15   CBC  Result Value Ref Range   WBC 9.6 4.0 - 10.5 K/uL   RBC 5.11 3.87 - 5.11 MIL/uL   Hemoglobin 13.8 12.0 - 15.0 g/dL   HCT 91.4 78.2 - 95.6 %   MCV 80.4 80.0 - 100.0 fL   MCH 27.0 26.0 - 34.0 pg   MCHC 33.6 30.0 - 36.0 g/dL   RDW 21.3 08.6 - 57.8 %   Platelets 229 150 - 400 K/uL   nRBC 0.0 0.0 - 0.2 %  CT Head Wo Contrast  Result Date: 07/18/2023 CLINICAL DATA:  Headache, sudden, severe.  Progressive dizziness. EXAM: CT HEAD WITHOUT CONTRAST TECHNIQUE: Contiguous axial images were obtained from the base of the skull through the vertex without intravenous contrast. RADIATION DOSE REDUCTION: This exam was performed according to the departmental dose-optimization program which includes automated exposure control, adjustment of the mA and/or kV according to patient size and/or use of iterative reconstruction technique. COMPARISON:  None Available. FINDINGS: Brain: No acute infarct, hemorrhage, or mass lesion is present. The ventricles are of normal size. No significant extraaxial fluid collection is present. Deep brain nuclei are within normal limits. The brainstem and cerebellum are within normal limits. Midline structures are within normal limits. Vascular: No hyperdense vessel or unexpected calcification. Skull: Calvarium is intact. No focal lytic or blastic lesions are present. No significant extracranial soft tissue lesion is present. Sinuses/Orbits: The paranasal sinuses and mastoid air cells are clear. The globes and orbits are within normal limits. IMPRESSION: Normal CT appearance of the brain. Electronically Signed   By: Marin Roberts M.D.   On: 07/18/2023 18:25   DG HIP UNILAT W OR W/O PELVIS 2-3 VIEWS LEFT  Result Date: 07/01/2023 CLINICAL DATA:  Left hip pain EXAM: DG HIP (WITH OR WITHOUT PELVIS) 2-3V LEFT COMPARISON:  04/30/2018 FINDINGS: There is no evidence of hip fracture or dislocation. Joint space is preserved. Mild acetabular spurring. IMPRESSION: Mild degenerative  changes of the left hip. No acute findings. Electronically Signed   By: Duanne Guess D.O.   On: 07/01/2023 16:55   DG Lumbar Spine 2-3 Views  Result Date: 07/01/2023 CLINICAL DATA:  Back pain EXAM: LUMBAR SPINE - 2-3 VIEW COMPARISON:  05/30/2020 FINDINGS: Five lumbar type vertebral segments. Vertebral body heights and alignment are maintained. No fracture identified. Multilevel intervertebral disc space loss with associated degenerative endplate changes, most pronounced at L4-5. Lower lumbar facet arthrosis. IMPRESSION: Multilevel lumbar spondylosis, most pronounced at L4-5. Findings have slightly progressed since 2021. No acute findings. Electronically Signed   By: Duanne Guess D.O.   On: 07/01/2023 16:53    EKG EKG Interpretation Date/Time:  Saturday July 18 2023 17:42:14 EST Ventricular Rate:  97 PR Interval:  146 QRS Duration:  100 QT Interval:  391 QTC Calculation: 497 R Axis:   50  Text Interpretation: Sinus rhythm Low voltage, precordial leads Nonspecific T wave abnormality Baseline wander Confirmed by Cathren Laine ([id number]) on 07/18/2023 7:17:14 PM  Radiology CT Head Wo Contrast  Result Date: 07/18/2023 CLINICAL DATA:  Headache, sudden, severe.  Progressive dizziness. EXAM: CT HEAD WITHOUT CONTRAST TECHNIQUE: Contiguous axial images were obtained from the base of the skull through the vertex without intravenous contrast. RADIATION DOSE REDUCTION: This exam was performed according to the departmental dose-optimization program which includes automated exposure control, adjustment of the mA and/or kV according to patient size and/or use of iterative reconstruction technique. COMPARISON:  None Available. FINDINGS: Brain: No acute infarct, hemorrhage, or mass lesion is present. The ventricles are of normal size. No significant extraaxial fluid collection is present. Deep brain nuclei are within normal limits. The brainstem and cerebellum are within normal limits. Midline  structures are within normal limits. Vascular: No hyperdense vessel or unexpected calcification. Skull: Calvarium is intact. No focal lytic or blastic lesions are present. No significant extracranial soft tissue lesion is present. Sinuses/Orbits: The paranasal sinuses and mastoid air cells are clear. The globes and orbits are within normal limits. IMPRESSION: Normal CT appearance of the brain. Electronically Signed  By: Marin Roberts M.D.   On: 07/18/2023 18:25    Procedures Procedures    Medications Ordered in ED Medications  metoCLOPramide (REGLAN) injection 10 mg (10 mg Intravenous Given 07/18/23 1809)  HYDROmorphone (DILAUDID) injection 0.5 mg (0.5 mg Intravenous Given 07/18/23 1809)  ondansetron (ZOFRAN) injection 4 mg (4 mg Intravenous Given 07/18/23 1950)  morphine (PF) 4 MG/ML injection 4 mg (4 mg Intravenous Given 07/18/23 2217)    ED Course/ Medical Decision Making/ A&P                                 Medical Decision Making Problems Addressed: Dizziness: acute illness or injury with systemic symptoms that poses a threat to life or bodily functions Left-sided headache: acute illness or injury with systemic symptoms that poses a threat to life or bodily functions Nausea: acute illness or injury with systemic symptoms  Amount and/or Complexity of Data Reviewed Independent Historian:     Details: Signif other, hx External Data Reviewed: notes. Labs: ordered. Decision-making details documented in ED Course. Radiology: ordered and independent interpretation performed. Decision-making details documented in ED Course. ECG/medicine tests: ordered and independent interpretation performed. Decision-making details documented in ED Course.  Risk Prescription drug management. Parenteral controlled substances. Decision regarding hospitalization.   Iv ns. Continuous pulse ox and cardiac monitoring. Labs ordered/sent. Imaging ordered.   Differential diagnosis includes  ICH/SAH, migraine headache, etc. Dispo decision including potential need for admission considered - will get labs and imaging and reassess.   Reviewed nursing notes and prior charts for additional history. External reports reviewed. Additional history from: signif other.   Cardiac monitor: sinus rhythm, rate 90.  Reglan iv. Dilaudid iv.   Labs reviewed/interpreted by me - wbc and hgb normal. K sl low.   CT reviewed/interpreted by me - no hem.   Given acute symptoms, with dizziness/unsteadiness, will get mr imaging.   Pt with episode n/v in ED. Emesis clear to yellowish, no bloody or bilious emesis. No abd pain or tenderness. Zofran iv.   2328 mri pending. Signed out to Dr Manus Gunning to check results and dispo appropriately.           Final Clinical Impression(s) / ED Diagnoses Final diagnoses:  Left-sided headache  Dizziness  Nausea    Rx / DC Orders ED Discharge Orders     None         Cathren Laine, MD 07/18/23 2329

## 2023-07-18 NOTE — ED Notes (Addendum)
7:08 PM  Report received from previous RN. This RN assumes care of the patient.  7:20 PM  Patient with PMH HTN c/o frontal HA at approx 1700 progressively worsening with associated symptom of dizziness. Patient denies any nausea, vomiting, fevers, chills, CP, or SOB. She is alert and oriented x 4. She has equal rise and fall of the chest wall. Pending CT scan results. Patient's husband present at bedside. Call light in reach. Bed in lowest position.   7:34 PM  This RN attempted to ambulate patient. Patient reports feeling nauseous and began vomiting. Patient safely assisted back in stretcher. Dr. Cathren Laine notified and a request for nausea medication given. Awaiting orders from MD.   10:07 PM  Patient reports headache pain 6/10 on pain scale. Requests pain medication. Patient informed that she is next in line for MRI and that MRI will be done in approximately 15-20 minutes to get patient. Patient voices understanding. Dr. Cathren Laine notified about time for MRI and request made for pain medication for patient. Awaiting response from provider.   10:18 PM  Patient transported to MRI.   11:33 PM  Patient assisted to the restroom. Patient has steady and equal gait. Minimal assistance provided during ambulation from this RN.   12:29 AM  Patient transported to CT.   3:02 AM  Patient given saltine crackers and ice water. Patient denies any nausea or vomiting. PO challenge passed. Dr. Jeannett Senior Rancour notified.   3:46 AM  Discharge instructions discussed with patient and family. Patient and spouse voice understanding of discharge instructions and declines any questions or concerns regarding discharge. Patient is stable at discharge. Referral and follow up information discussed with patient, to which she voices understanding using teach-back method. Patient in NAD. Patient safely assisted to car and patient's husband to transport patient home.

## 2023-07-18 NOTE — ED Notes (Signed)
Patient transported to CT 

## 2023-07-19 ENCOUNTER — Emergency Department (HOSPITAL_COMMUNITY): Payer: 59

## 2023-07-19 DIAGNOSIS — R519 Headache, unspecified: Secondary | ICD-10-CM | POA: Diagnosis not present

## 2023-07-19 LAB — COOXEMETRY PANEL
Carboxyhemoglobin: 0.6 % (ref 0.5–1.5)
Methemoglobin: <0.7 % (ref 0.0–1.5)
O2 Saturation: 42.7 %
Total hemoglobin: 13.5 g/dL (ref 12.0–16.0)

## 2023-07-19 MED ORDER — KETOROLAC TROMETHAMINE 30 MG/ML IJ SOLN
15.0000 mg | Freq: Once | INTRAMUSCULAR | Status: AC
  Administered 2023-07-19: 15 mg via INTRAVENOUS
  Filled 2023-07-19: qty 1

## 2023-07-19 MED ORDER — DIPHENHYDRAMINE HCL 50 MG/ML IJ SOLN
25.0000 mg | Freq: Once | INTRAMUSCULAR | Status: AC
  Administered 2023-07-19: 25 mg via INTRAVENOUS
  Filled 2023-07-19: qty 1

## 2023-07-19 MED ORDER — DEXAMETHASONE SODIUM PHOSPHATE 10 MG/ML IJ SOLN
10.0000 mg | Freq: Once | INTRAMUSCULAR | Status: AC
  Administered 2023-07-19: 10 mg via INTRAVENOUS
  Filled 2023-07-19: qty 1

## 2023-07-19 MED ORDER — IOHEXOL 350 MG/ML SOLN
75.0000 mL | Freq: Once | INTRAVENOUS | Status: AC | PRN
  Administered 2023-07-19: 75 mL via INTRAVENOUS

## 2023-07-19 NOTE — ED Provider Notes (Signed)
Care assumed from Dr. Denton Lank.  Patient with sudden onset left-sided headache with no history of the same.  Neurologically intact.  CT head is negative.  Awaiting MRI  MRI is negative for hemorrhage, infarct or evidence of vertebral basilar artery insufficiency.  MRV is negative.''  CTA negative for hemorrhage or aneurysm.  Patient feels improved.  She is tolerating p.o. and ambulatory.  Will refer to neurology for further evaluation of her headache.  No evidence of intracranial hemorrhage.  No evidence of aneurysm.  MRI is negative for stroke or other acute abnormality.  Carboxyhemoglobin was obtained given her smoke exposure but this is within normal limits.  Followup with the neurologist for further evaluation of your headache.   Feels improved.  She is tolerating p.o. and ambulatory. Follow-up with neurology.  Return precautions discussed   Glynn Octave, MD 07/19/23 [phone]

## 2023-07-20 ENCOUNTER — Other Ambulatory Visit: Payer: Self-pay

## 2023-07-20 ENCOUNTER — Other Ambulatory Visit (HOSPITAL_COMMUNITY): Payer: Self-pay

## 2023-07-20 ENCOUNTER — Inpatient Hospital Stay: Payer: 59 | Attending: Hematology and Oncology | Admitting: Hematology and Oncology

## 2023-07-20 VITALS — BP 152/72 | HR 90 | Temp 98.3°F | Resp 18 | Ht 65.0 in | Wt 261.2 lb

## 2023-07-20 DIAGNOSIS — Z170 Estrogen receptor positive status [ER+]: Secondary | ICD-10-CM | POA: Insufficient documentation

## 2023-07-20 DIAGNOSIS — Z1721 Progesterone receptor positive status: Secondary | ICD-10-CM | POA: Diagnosis not present

## 2023-07-20 DIAGNOSIS — C50412 Malignant neoplasm of upper-outer quadrant of left female breast: Secondary | ICD-10-CM | POA: Insufficient documentation

## 2023-07-20 DIAGNOSIS — R519 Headache, unspecified: Secondary | ICD-10-CM | POA: Diagnosis not present

## 2023-07-20 DIAGNOSIS — Z923 Personal history of irradiation: Secondary | ICD-10-CM | POA: Insufficient documentation

## 2023-07-20 DIAGNOSIS — R29818 Other symptoms and signs involving the nervous system: Secondary | ICD-10-CM | POA: Diagnosis not present

## 2023-07-20 DIAGNOSIS — Z79810 Long term (current) use of selective estrogen receptor modulators (SERMs): Secondary | ICD-10-CM | POA: Insufficient documentation

## 2023-07-20 DIAGNOSIS — M7989 Other specified soft tissue disorders: Secondary | ICD-10-CM | POA: Diagnosis not present

## 2023-07-20 MED ORDER — TAMOXIFEN CITRATE 20 MG PO TABS
20.0000 mg | ORAL_TABLET | Freq: Every day | ORAL | 3 refills | Status: DC
  Filled 2023-07-20: qty 90, fill #0

## 2023-07-20 NOTE — Assessment & Plan Note (Signed)
05/20/2019: Routine screening mammogram detected a 1.4cm left breast mass at the 2:30 position, no axillary adenopathy. 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  T1CN0 stage Ia clinical stage   06/14/2019: Left lumpectomy: Grade 2 IDC, 1.7 cm, high-grade DCIS, margins negative, no lymphovascular or perineural invasion, 0/2 lymph nodes negative, ER 100%, PR 100%, HER-2 negative, Ki-67 10% Oncotype DX score: 13, low risk, risk of distant recurrence at 9 years 4% Genetic testing: Negative Adjuvant radiation therapy: 07/14/2019-08/29/2019   Current treatment: Adjuvant antiestrogen therapy with tamoxifen 20 mg daily, could switch to aromatase inhibitor once she is menopausal   Tamoxifen toxicities: Right lower extremity muscle aches and pains: I instructed her to take tamoxifen at bedtime and also to take vitamin D and tonic water. She continues to have menstrual cycles and therefore she is not yet in menopause.  She is getting a work-up for her joint aches and pains.   Breast cancer surveillance: 1.  Mammogram 06/26/2022: Benign breast density category B, she needs to get another mammogram this year. 2. breast exam 07/20/2023: Benign   Bone Density 02/11/22: T score +1.7   Return to clinic in 1 year for follow-up

## 2023-07-20 NOTE — Progress Notes (Signed)
Patient Care Team: Georgina Quint, MD as PCP - General (Internal Medicine) Marykay Lex, MD as PCP - Cardiology (Cardiology) Almond Lint, MD as Consulting Physician (General Surgery) Serena Croissant, MD as Consulting Physician (Hematology and Oncology) Dorothy Puffer, MD as Consulting Physician (Radiation Oncology)  DIAGNOSIS:  Encounter Diagnosis  Name Primary?   Malignant neoplasm of upper-outer quadrant of left breast in female, estrogen receptor positive (HCC) Yes    SUMMARY OF ONCOLOGIC HISTORY: Oncology History  Malignant neoplasm of upper-outer quadrant of left breast in female, estrogen receptor positive (HCC)  05/20/2019 Initial Diagnosis   Routine screening mammogram detected a 1.4cm left breast mass at the 2:30 position, no axillary adenopathy. 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    06/05/2019 Genetic Testing   Negative genetic testing. No pathogenic variants identified on the [facility] Common Hereditary Cancers Panel + STAT Breast Cancer panel.The STAT Breast cancer panel offered by [facility] includes sequencing and rearrangement analysis for the following 9 genes:  ATM, BRCA1, BRCA2, CDH1, CHEK2, PALB2, PTEN, STK11 and TP53. The Common Hereditary Cancers Panel offered by [facility] includes sequencing and/or deletion duplication testing of the following 47 genes: APC, ATM, AXIN2, BARD1, BMPR1A, BRCA1, BRCA2, BRIP1, CDH1, CDKN2A (p14ARF), CDKN2A (p16INK4a), CKD4, CHEK2, CTNNA1, DICER1, EPCAM (Deletion/duplication testing only), GREM1 (promoter region deletion/duplication testing only), KIT, MEN1, MLH1, MSH2, MSH3, MSH6, MUTYH, NBN, NF1, NHTL1, PALB2, PDGFRA, PMS2, POLD1, POLE, PTEN, RAD50, RAD51C, RAD51D, SDHB, SDHC, SDHD, SMAD4, SMARCA4. STK11, TP53, TSC1, TSC2, and VHL.  The following genes were evaluated for sequence changes only: SDHA and HOXB13 c.251G>A variant only. The report date is 06/05/2019.    06/14/2019 Surgery   Left  lumpectomy Donell Beers) [phone]): IDC, grade 2, with high grade DCIS, 1.7cm, clear margins, 2 left axillary lymph nodes negative for carcinoma.    06/24/2019 Oncotype testing   Recurrence score: 13; low risk, distant recurrence at 9 years: 4%   07/13/2019 - 08/31/2019 Radiation Therapy   The patient initially received a dose of 50.4 Gy in 28 fractions to the breast using whole-breast tangent fields. This was delivered using a 3-D conformal technique. The patient then received a boost to the seroma. This delivered an additional 10 Gy in 5 fractions using a 3-field photon boost technique. The total dose was 60.4 Gy.   09/2019 - 09/2024 Anti-estrogen oral therapy   Tamoxifen     CHIEF COMPLIANT: f/u on Tamoxifen  HISTORY OF PRESENT ILLNESS:  History of Present Illness   The patient, with a history of breast cancer, presents with concerns about recent health issues. She reports an episode of severe headache, shaking, and speech difficulty that led to an emergency room visit. The headache was described as sudden and pounding, causing significant distress. The patient also reports experiencing swelling in her foot, which has been an ongoing issue.  In addition to these symptoms, the patient mentions a persistent pain in the area of her previous breast cancer surgery. Despite the surgery being four years ago, the patient continues to experience discomfort and expresses concern about potential recurrence.  The patient also mentions a recent visit to a cardiologist due to concerns about her leg veins. She reports that one of her veins was not pumping at 100%, leading to further concerns about her health.  The patient is currently on tamoxifen for her history of breast cancer. She reports occasional missed doses but has implemented a system to improve adherence to the medication regimen.  ALLERGIES:  is allergic to other.  MEDICATIONS:  Current Outpatient Medications  Medication Sig  Dispense Refill   amLODipine (NORVASC) 5 MG tablet Take 1 tablet (5 mg total) by mouth daily. 90 tablet 3   cyclobenzaprine (FLEXERIL) 10 MG tablet Take 1 tablet (10 mg total) by mouth at bedtime. 30 tablet 0   diclofenac Sodium (VOLTAREN) 1 % GEL APPLY 2 GRAMS TO THE AFFECTED AREA(S) BY TOPICAL ROUTE 2-3 TIMES PER DAY 100 g 0   losartan-hydrochlorothiazide (HYZAAR) 100-12.5 MG tablet Take 1 tablet by mouth daily. 90 tablet 3   nitroGLYCERIN (NITROSTAT) 0.4 MG SL tablet Place 1 tablet (0.4 mg total) under the tongue every 5 (five) minutes as needed for chest pain. 25 tablet 4   tamoxifen (NOLVADEX) 20 MG tablet TAKE 1 TABLET BY MOUTH ONCE DAILY 90 tablet 3   VITAMIN D PO Take 1,000 Units by mouth daily. (Patient not taking: Reported on 07/19/2023)     No current facility-administered medications for this visit.    PHYSICAL EXAMINATION: ECOG PERFORMANCE STATUS: 1 - Symptomatic but completely ambulatory  Vitals:   07/20/23 0938  BP: (!) 152/72  Pulse: 90  Resp: 18  Temp: 98.3 F (36.8 C)  SpO2: 97%   Filed Weights   07/20/23 0938  Weight: 261 lb 3.2 oz (118.5 kg)    Physical Exam   BREAST: Scar tissue palpated, no abnormalities noted. SKIN: No signs of infection or abnormal swelling observed.      (exam performed in the presence of a chaperone)  LABORATORY DATA:  I have reviewed the data as listed    Latest Ref Rng & Units 07/18/2023    6:00 PM 02/06/2023   11:40 AM 01/17/2022    8:28 AM  CMP  Glucose 70 - 99 mg/dL 542  98  706   BUN 6 - 20 mg/dL 12  8  14    Creatinine 0.44 - 1.00 mg/dL 2.37  6.28  3.15   Sodium 135 - 145 mmol/L 136  142  139   Potassium 3.5 - 5.1 mmol/L 3.2  4.1  4.4   Chloride 98 - 111 mmol/L 106  104  104   CO2 22 - 32 mmol/L 17  22  26    Calcium 8.9 - 10.3 mg/dL 9.0  9.1  9.2   Total Protein 6.0 - 8.3 g/dL   7.3   Total Bilirubin 0.2 - 1.2 mg/dL   0.4   Alkaline Phos 39 - 117 U/L   53   AST 0 - 37 U/L   18   ALT 0 - 35 U/L   21     Lab Results   Component Value Date   WBC 9.6 07/18/2023   HGB 13.8 07/18/2023   HCT 41.1 07/18/2023   MCV 80.4 07/18/2023   PLT 229 07/18/2023   NEUTROABS 4.2 01/17/2022    ASSESSMENT & PLAN:  Malignant neoplasm of upper-outer quadrant of left breast in female, 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: Routine screening mammogram detected a 1.4cm left breast mass at the 2:30 position, no axillary adenopathy. 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  T1CN0 stage Ia clinical stage   06/14/2019: Left lumpectomy: Grade 2 IDC, 1.7 cm, high-grade DCIS, margins negative, no lymphovascular or perineural invasion, 0/2 lymph nodes negative, ER 100%, PR 100%, HER-2 negative, Ki-67 10% Oncotype DX score: 13, low risk, risk of distant recurrence at 9 years 4% Genetic testing: Negative Adjuvant radiation  therapy: 07/14/2019-08/29/2019   Current treatment: Adjuvant antiestrogen therapy with tamoxifen 20 mg daily, could switch to aromatase inhibitor once she is menopausal   Tamoxifen toxicities: Right lower extremity muscle aches and pains: I instructed her to take tamoxifen at bedtime and also to take vitamin D and tonic water. She continues to have menstrual cycles and therefore she is not yet in menopause.  She is getting a work-up for her joint aches and pains.   Breast cancer surveillance: 1.  Mammogram 06/26/2022: Benign breast density category B, she needs to get another mammogram this year. 2. breast exam 07/20/2023: Benign   Bone Density 02/11/22: T score +1.7  Menopausal Status Unclear if patient is in menopause. This information is pertinent for potential switch in medication next year. -Advise patient to request menopause blood test during next gynecology visit.  Lower Extremity Swelling Patient reports chronic mild swelling in right foot, more so than left. No blood clots found on previous examination. -Advise patient to wear compression socks,  particularly during long work hours on feet.  Headache and Neurological Symptoms Patient experienced a severe headache and neurological symptoms, prompting an ER visit. CT angiogram and MRI of the brain were normal. -Continue monitoring symptoms.  General Health Maintenance -Scheduled mammogram on December 5th. -Continue regular check-ups with gynecologist. -Follow-up in one year or sooner if any issues arise.          No orders of the defined types were placed in this encounter.  The patient has a good understanding of the overall plan. she agrees with it. she will call with any problems that may develop before the next visit here. Total time spent: 30 mins including face to face time and time spent for planning, charting and co-ordination of care   Erin Meek, MD 07/20/23

## 2023-07-23 ENCOUNTER — Ambulatory Visit: Payer: 59 | Admitting: Podiatry

## 2023-07-24 ENCOUNTER — Ambulatory Visit: Payer: 59 | Admitting: Orthopaedic Surgery

## 2023-07-26 ENCOUNTER — Ambulatory Visit
Admission: RE | Admit: 2023-07-26 | Discharge: 2023-07-26 | Disposition: A | Discharge status: Home / Self Care | Payer: 59 | Source: Ambulatory Visit | Attending: Emergency Medicine | Admitting: Emergency Medicine

## 2023-07-26 DIAGNOSIS — M5432 Sciatica, left side: Secondary | ICD-10-CM

## 2023-07-26 DIAGNOSIS — M47816 Spondylosis without myelopathy or radiculopathy, lumbar region: Secondary | ICD-10-CM | POA: Diagnosis not present

## 2023-07-26 DIAGNOSIS — M25552 Pain in left hip: Secondary | ICD-10-CM

## 2023-07-26 DIAGNOSIS — M5126 Other intervertebral disc displacement, lumbar region: Secondary | ICD-10-CM | POA: Diagnosis not present

## 2023-07-27 DIAGNOSIS — M67962 Unspecified disorder of synovium and tendon, left lower leg: Secondary | ICD-10-CM | POA: Diagnosis not present

## 2023-07-27 DIAGNOSIS — M67961 Unspecified disorder of synovium and tendon, right lower leg: Secondary | ICD-10-CM | POA: Diagnosis not present

## 2023-07-28 ENCOUNTER — Ambulatory Visit: Payer: 59 | Admitting: Orthopaedic Surgery

## 2023-07-28 DIAGNOSIS — M25552 Pain in left hip: Secondary | ICD-10-CM

## 2023-07-28 DIAGNOSIS — M51362 Other intervertebral disc degeneration, lumbar region with discogenic back pain and lower extremity pain: Secondary | ICD-10-CM | POA: Diagnosis not present

## 2023-07-28 NOTE — Progress Notes (Signed)
Office Visit Note   Patient: Erin Gallagher           Date of Birth: [birth year]           MRN: [id number] Visit Date: 07/28/2023              Requested by: Georgina Quint, MD [address] [place],  [place] [address] PCP: Georgina Quint, MD   Assessment & Plan: Visit Diagnoses:  1. Pain in left hip   2. Degeneration of intervertebral disc of lumbar region with discogenic back pain and lower extremity pain     Plan: Will proceed with the epidural injection.  Patient continue to work on weight loss.  She can follow-up with me several weeks after the epidural.  Follow-Up Instructions: Return in about 8 weeks (around 09/22/2023).   Orders:  Orders Placed This Encounter  Procedures   Ambulatory referral to Physical Medicine Rehab   No orders of the defined types were placed in this encounter.     Procedures: No procedures performed   Clinical Data: No additional findings.   Subjective: Chief Complaint  Patient presents with   Lower Back - Pain   Left Hip - Pain    HPI 52 year old female with left hip pain for several months difficulty walking pain with sitting and standing Tylenol not effective.  Previous scan showed lateral recess stenosis on the left at L4-5.  BMI greater than 40 she has been working on weight loss.  MRI scan has been obtained available for review.  No acute findings.  Manage degenerative changes at L4-5 with left lateral recess stenosis.  Minimal disc bulging L2-3 and L3-4.  Review of Systems all of systems updated unchanged.   Objective: Vital Signs: LMP 12/30/2021   Physical Exam Constitutional:      Appearance: She is well-developed.  HENT:     Head: Normocephalic.     Right Ear: External ear normal.     Left Ear: External ear normal. There is no impacted cerumen.  Eyes:     Pupils: Pupils are equal, round, and reactive to light.  Neck:     Thyroid: No thyromegaly.     Trachea: No tracheal deviation.   Cardiovascular:     Rate and Rhythm: Normal rate.  Pulmonary:     Effort: Pulmonary effort is normal.  Abdominal:     Palpations: Abdomen is soft.  Musculoskeletal:     Cervical back: No rigidity.  Skin:    General: Skin is warm and dry.  Neurological:     Mental Status: She is alert and oriented to person, place, and time.  Psychiatric:        Behavior: Behavior normal.     Ortho Exam  Specialty Comments:  MRI LUMBAR SPINE WITHOUT CONTRAST   TECHNIQUE: Multiplanar, multisequence MR imaging of the lumbar spine was performed. No intravenous contrast was administered.   COMPARISON:  Lumbar spine radiographs 07/01/2023. Abdominal CT 04/13/2023   FINDINGS: Segmentation: Conventional anatomy assumed, with the last open disc space designated L5-S1.Concordant with prior imaging.   Alignment: Straightening without focal angulation or significant listhesis.   Vertebrae: No worrisome osseous lesion, acute fracture or pars defect. Mild chronic endplate degenerative changes at L4-5. The visualized sacroiliac joints appear unremarkable.   Conus medullaris: Extends to the L1 level and appears normal.   Paraspinal and other soft tissues: No significant paraspinal findings.   Disc levels:   Sagittal images demonstrate circumferential disc bulging at T11-12 without resulting  significant spinal stenosis or foraminal narrowing.   L1-2: The disc appears normal. Mild bilateral facet hypertrophy. No spinal stenosis or nerve root encroachment.   L2-3: Mild loss of disc height with mild annular disc bulging. Mild to moderate facet and ligamentous hypertrophy. No significant spinal stenosis or nerve root encroachment.   L3-4: Mild loss of disc height with annular disc bulging eccentric to the left. Mild to moderate facet and ligamentous hypertrophy. No significant spinal stenosis or nerve root encroachment.   L4-5: Advanced loss of disc height with annular disc bulging  and endplate osteophytes asymmetric to the left. Mild to moderate facet and ligamentous hypertrophy. Asymmetric narrowing of the left lateral recess with possible left L5 nerve root encroachment. Mild biforaminal narrowing (greater on the left) without definite exiting L4 nerve root encroachment. The spinal canal is patent.   L5-S1: Disc height and hydration are maintained. Moderate bilateral facet hypertrophy. No spinal stenosis or nerve root encroachment.   IMPRESSION: 1. No acute findings or clear explanation for the patient's symptoms. 2. 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 There is resulting asymmetric narrowing of the left lateral recess and possible left L5 nerve root encroachment. 3. Mild disc bulging and facet hypertrophy at L2-3 and L3-4 without resulting spinal stenosis or nerve root encroachment. 4. Moderate facet hypertrophy at L5-S1 without resulting spinal stenosis or nerve root encroachment.     Electronically Signed   By: Carey Bullocks M.D.   On: 07/28/2023 15:35  Imaging: No results found.   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   Suspected sleep apnea 10/30/2021   BMI 40.0-44.9, adult (HCC) 02/06/2021   Morbid obesity (HCC) 02/06/2021   History of breast cancer 02/06/2021   Malignant neoplasm of upper-outer quadrant of left breast in female, estrogen receptor  positive (HCC) 05/20/2019   Arthritis 11/23/2017   Essential hypertension 06/22/2013   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   Personal history of radiation therapy     Family History  Problem Relation Age of Onset   Hypertension Mother    Hypertension Father    Colon cancer Father        dx 72s   Throat cancer Maternal Uncle    Lung cancer Paternal Uncle    Stroke Maternal Grandmother    Hypertension Maternal Grandmother    Aneurysm Maternal Grandmother    Healthy Son     Past Surgical History:  Procedure Laterality Date   BREAST BIOPSY Left 05/16/2019  BREAST LUMPECTOMY Left 06/14/2019   BREAST LUMPECTOMY WITH RADIOACTIVE SEED AND SENTINEL LYMPH NODE BIOPSY Left 06/14/2019   Procedure: LEFT BREAST LUMPECTOMY WITH RADIOACTIVE SEED AND SENTINEL LYMPH NODE BIOPSY;  Surgeon: Almond Lint, MD;  Location: [facility];  Service: General;  Laterality: Left;   CESAREAN SECTION     1997   CHOLECYSTECTOMY     TUBAL LIGATION     1998   Social History   Occupational History   Occupation: Merchandiser, retail  Tobacco Use   Smoking status: Never    Passive exposure: Current   Smokeless tobacco: Never  Vaping Use   Vaping status: Never Used  Substance and Sexual Activity   Alcohol use: Yes    Alcohol/week: 2.0 standard drinks of alcohol    Types: 2 Standard drinks or equivalent per week    Comment: occasionally - 2 out of a 7 day period   Drug use: No   Sexual activity: Yes    Birth control/protection: Surgical    Comment: Tubal ligation

## 2023-07-29 ENCOUNTER — Encounter: Payer: Self-pay | Admitting: Emergency Medicine

## 2023-07-29 ENCOUNTER — Ambulatory Visit (INDEPENDENT_AMBULATORY_CARE_PROVIDER_SITE_OTHER): Payer: 59 | Admitting: Emergency Medicine

## 2023-07-29 ENCOUNTER — Other Ambulatory Visit (HOSPITAL_COMMUNITY): Payer: Self-pay | Admitting: Orthopedic Surgery

## 2023-07-29 VITALS — BP 126/84 | HR 101 | Temp 97.9°F | Ht 65.0 in | Wt 261.0 lb

## 2023-07-29 DIAGNOSIS — I10 Essential (primary) hypertension: Secondary | ICD-10-CM | POA: Diagnosis not present

## 2023-07-29 DIAGNOSIS — M5432 Sciatica, left side: Secondary | ICD-10-CM

## 2023-07-29 DIAGNOSIS — M51362 Other intervertebral disc degeneration, lumbar region with discogenic back pain and lower extremity pain: Secondary | ICD-10-CM | POA: Diagnosis not present

## 2023-07-29 DIAGNOSIS — M25552 Pain in left hip: Secondary | ICD-10-CM

## 2023-07-29 MED ORDER — KETOROLAC TROMETHAMINE 60 MG/2ML IM SOLN: 60.0000 mg | Freq: Once | INTRAMUSCULAR | Status: DC

## 2023-07-29 MED ORDER — KETOROLAC TROMETHAMINE 60 MG/2ML IM SOLN
60.0000 mg | Freq: Once | INTRAMUSCULAR | Status: AC
  Administered 2023-07-29: 60 mg via INTRAMUSCULAR

## 2023-07-29 NOTE — Assessment & Plan Note (Signed)
BP Readings from Last 3 Encounters:  07/29/23 126/84  07/20/23 (!) 152/72  07/19/23 138/74  Well-controlled hypertension Continue amlodipine 5 mg daily and Hyzaar 100-12.5 mg daily

## 2023-07-29 NOTE — Patient Instructions (Signed)
Degenerative Disk Disease  Degenerative disk disease is a condition caused by changes that occur in the spinal disks as a person ages. Spinal disks are soft and compressible disks located between the bones of your spine (vertebrae). These disks act like shock absorbers. Degenerative disk disease can affect the whole spine. However, the neck and lower back are most often affected. Many changes can occur in the spinal disks with aging, such as: The spinal disks may dry and shrink. Small tears may occur in the tough, outer covering of the disk (annulus). The disk space may become smaller due to loss of water. Abnormal growths in the bone (spurs) may occur. This can put pressure on the nerve roots exiting the spinal canal, causing pain. The spinal canal may become narrowed. What are the causes? This condition may be caused by: Normal degeneration with age. Injuries. Certain activities and sports that cause damage. What increases the risk? The following factors may make you more likely to develop this condition: Being overweight. Having a family history of degenerative disk disease. Smoking and use of products that contain nicotine and tobacco. Sudden injury. Doing work that requires heavy lifting. What are the signs or symptoms? Symptoms of this condition include: Pain that varies in intensity. Some people have no pain, while others have severe pain. The location of the pain depends on the part of your backbone that is affected. You may have: Pain in your neck or arm if a disk in your neck area is affected. Pain in your back, buttocks, or legs if a disk in your lower back is affected. Pain that becomes worse while bending or reaching up, or with twisting movements. Pain that may start gradually and worsen as time passes. It may also start after a major or minor injury. Numbness or tingling in the arms or legs. How is this diagnosed? This condition may be diagnosed based on: Your symptoms  and medical history. A physical exam. Imaging tests, including: X-ray of the spine. CT scan. MRI. How is this treated? This condition may be treated with: Medicines. Injection of steroids into the back. Rehabilitation exercises. These activities aim to strengthen muscles in your back and abdomen to better support your spine. If treatments do not help to relieve your symptoms or you have severe pain, you may need surgery. Follow these instructions at home: Medicines Take over-the-counter and prescription medicines only as told by your health care provider. Ask your health care provider if the medicine prescribed to you: Requires you to avoid driving or using machinery. Can cause constipation. You may need to take these actions to prevent or treat constipation: Drink enough fluid to keep your urine pale yellow. Take over-the-counter or prescription medicines. Eat foods that are high in fiber, such as beans, whole grains, and fresh fruits and vegetables. Limit foods that are high in fat and processed sugars, such as fried or sweet foods. Activity Rest as told by your health care provider. Avoid sitting for a long time without moving. Get up to take short walks every 1-2 hours. This is important to improve blood flow and breathing. Ask for help if you feel weak or unsteady. Return to your normal activities as told by your health care provider. Ask your health care provider what activities are safe for you. Perform relaxation exercises as told by your health care provider. Maintain good posture. Do not lift anything that is heavier than 10 lb (4.5 kg), or the limit that you are told, until your health  care provider says that it is safe. Follow proper lifting and walking techniques as told by your health care provider. Managing pain, stiffness, and swelling     If directed, put ice on the painful area. Icing can help to relieve pain. To do this: Put ice in a plastic bag. Place a towel  between your skin and the bag. Leave the ice on for 20 minutes, 2-3 times a day. Remove the ice if your skin turns bright red. This is very important. If you cannot feel pain, heat, or cold, you have a greater risk of damage to the area. If directed, apply heat to the painful area as often as told by your health care provider. Heat can reduce the stiffness of your muscles. Use the heat source that your health care provider recommends, such as a moist heat pack or a heating pad. Place a towel between your skin and the heat source. Leave the heat on for 20-30 minutes. Remove the heat if your skin turns bright red. This is especially important if you are unable to feel pain, heat, or cold. You may have a greater risk of getting burned. General instructions Change your sitting, standing, and sleeping habits as told by your health care provider. Avoid sitting in the same position for long periods of time. Change positions frequently. Lose weight or maintain a healthy weight as told by your health care provider. Do not use any products that contain nicotine or tobacco, such as cigarettes, e-cigarettes, and chewing tobacco. If you need help quitting, ask your health care provider. Wear supportive footwear. Keep all follow-up visits. This is important. This may include visits for physical therapy. Contact a health care provider if you: Have pain that does not go away within 1-4 weeks. Lose your appetite. Lose weight without trying. Get help right away if you: Have severe pain. Notice weakness in your arms, hands, or legs. Begin to lose control of your bladder or bowel movements. Have fevers or night sweats. Summary Degenerative disk disease is a condition caused by changes that occur in the spinal disks as a person ages. This condition can affect the whole spine. However, the neck and lower back are most often affected. Take over-the-counter and prescription medicines only as told by your health  care provider. This information is not intended to replace advice given to you by your health care provider. Make sure you discuss any questions you have with your health care provider. Document Revised: 12/01/2019 Document Reviewed: 12/01/2019 Elsevier Patient Education  2024 [employer].

## 2023-07-29 NOTE — Assessment & Plan Note (Signed)
Creating chronic pain and affecting quality of life Was evaluated by orthopedics yesterday Plan is for epidural injections MRI of lumbar spine report reviewed with patient Worse findings at L4-5 levels with asymmetric disc bulging and resulting left L5 nerve root encroachment

## 2023-07-29 NOTE — Assessment & Plan Note (Signed)
Better controlled.  Still having intermittent pains Lumbar spine MRI shows degenerative disc disease worse at L4-5 with left L5 nerve root encroachment

## 2023-07-29 NOTE — Assessment & Plan Note (Signed)
Contributing to present condition  diet and nutrition discussed Advised to the amount of daily carbohydrate intake and daily calories and increase amount of plant-based protein in her die

## 2023-07-29 NOTE — Progress Notes (Signed)
Erin Gallagher 52 y.o.   Chief Complaint  Patient presents with   Follow-up    Patients she is feeling okay today . F/u for ED visit and MRI scan results     HISTORY OF PRESENT ILLNESS: This is a 52 y.o. female here for follow-up of emergency department visit on 07/19/2023 when she presented with acute onset of headache. Has family history of cerebral aneurysms.  MRA of head and neck was negative for aneurysms.  Much improved today. Last seen by me last October complaining of hip pain.  Was able to follow-up with orthopedics yesterday.  Lumbar spine MRI showed degenerative disc disease.  Plan is for epidural injections. No other complaints or medical concerns today.   HPI   Prior to Admission medications   Medication Sig Start Date End Date Taking? Authorizing Provider  amLODipine (NORVASC) 5 MG tablet Take 1 tablet (5 mg total) by mouth daily. 03/11/23  Yes Azion Centrella, Eilleen Kempf, MD  cyclobenzaprine (FLEXERIL) 10 MG tablet Take 1 tablet (10 mg total) by mouth at bedtime. 07/01/23  Yes Ryland Tungate, Eilleen Kempf, MD  diclofenac Sodium (VOLTAREN) 1 % GEL APPLY 2 GRAMS TO THE AFFECTED AREA(S) BY TOPICAL ROUTE 2-3 TIMES PER DAY 06/29/23  Yes   losartan-hydrochlorothiazide (HYZAAR) 100-12.5 MG tablet Take 1 tablet by mouth daily. 03/11/23  Yes Donie Lemelin, Eilleen Kempf, MD  nitroGLYCERIN (NITROSTAT) 0.4 MG SL tablet Place 1 tablet (0.4 mg total) under the tongue every 5 (five) minutes as needed for chest pain. 02/02/23  Yes Marykay Lex, MD  tamoxifen (NOLVADEX) 20 MG tablet Take 1 tablet (20 mg total) by mouth daily. 07/20/23  Yes Serena Croissant, MD  VITAMIN D PO Take 1,000 Units by mouth daily.   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   Suspected sleep apnea 10/30/2021   BMI 40.0-44.9, adult (HCC) 02/06/2021   Morbid obesity (HCC) 02/06/2021   History of breast cancer 02/06/2021   Malignant neoplasm of upper-outer quadrant of left breast in female, 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   Arthritis 11/23/2017   Essential hypertension 06/22/2013    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   Personal history of radiation therapy     Past Surgical History:  Procedure Laterality Date   BREAST BIOPSY Left 05/16/2019   BREAST LUMPECTOMY Left 06/14/2019   BREAST LUMPECTOMY WITH RADIOACTIVE SEED AND SENTINEL LYMPH NODE BIOPSY Left 06/14/2019   Procedure: LEFT BREAST LUMPECTOMY WITH RADIOACTIVE SEED AND SENTINEL  LYMPH NODE BIOPSY;  Surgeon: Almond Lint, MD;  Location: [facility];  Service: General;  Laterality: Left;   CESAREAN SECTION     1997   CHOLECYSTECTOMY     TUBAL LIGATION     1998    Social History   Socioeconomic History   Marital status: Married    Spouse name: Not on file   Number of children: 1   Years of education: Not on file   Highest education level: 12th grade  Occupational History   Occupation: Merchandiser, retail  Tobacco Use   Smoking status: Never    Passive exposure: Current   Smokeless tobacco: Never  Vaping Use   Vaping  status: Never Used  Substance and Sexual Activity   Alcohol use: Yes    Alcohol/week: 2.0 standard drinks of alcohol    Types: 2 Standard drinks or equivalent per week    Comment: occasionally - 2 out of a 7 day period   Drug use: No   Sexual activity: Yes    Birth control/protection: Surgical    Comment: Tubal ligation  Other Topics Concern   Not on file  Social History Narrative   Caffiene 1 cup coffee daily.    Lives home with spouse   Works at [employer]   Education 62yrs   Children one.    Social Determinants of Health   Financial Resource Strain: Low Risk  (06/30/2023)   Overall Financial Resource Strain (CARDIA)    Difficulty of Paying Living Expenses: Not hard at all  Food Insecurity: No Food Insecurity (06/30/2023)   Hunger Vital Sign    Worried About Running Out of Food in the Last Year: Never true    Ran Out of Food in the Last Year: Never true  Transportation Needs: No Transportation Needs (06/30/2023)   PRAPARE - Administrator, Civil Service (Medical): No    Lack of Transportation (Non-Medical): No  Physical Activity: Unknown (06/30/2023)   Exercise Vital Sign    Days of Exercise per Week: Patient declined    Minutes of Exercise per Session: 30 min  Stress: Stress Concern Present (06/30/2023)   [employer] of Occupational Health - Occupational Stress Questionnaire    Feeling of Stress : To some extent  Social Connections: Unknown (06/30/2023)   Social Connection and Isolation Panel [NHANES]    Frequency of Communication with Friends and Family: More than three times a week    Frequency of Social Gatherings with Friends and Family: Twice a week    Attends Religious Services: Patient declined    Database administrator or Organizations: No    Attends Engineer, structural: Not on file    Marital Status: Married  Catering manager Violence: Not on file    Family History  Problem Relation Age of Onset   Hypertension Mother     Hypertension Father    Colon cancer Father        dx 49s   Throat cancer Maternal Uncle    Lung cancer Paternal Uncle    Stroke Maternal Grandmother    Hypertension Maternal Grandmother    Aneurysm Maternal Grandmother    Healthy Son      Review of Systems  Constitutional: Negative.  Negative for chills and fever.  HENT: Negative.  Negative for congestion and sore throat.   Respiratory: Negative.  Negative for cough and shortness of breath.   Cardiovascular: Negative.  Negative for chest pain and palpitations.  Gastrointestinal:  Negative for  abdominal pain, diarrhea, nausea and vomiting.  Genitourinary: Negative.  Negative for dysuria and hematuria.  Musculoskeletal:  Positive for back pain.  Skin: Negative.  Negative for rash.  Neurological: Negative.  Negative for dizziness and headaches.  All other systems reviewed and are negative.   Vitals:   07/29/23 1507  BP: 126/84  Pulse: (!) 101  Temp: 97.9 F (36.6 C)  SpO2: 95%    Physical Exam Vitals reviewed.  Constitutional:      Appearance: Normal appearance.  HENT:     Head: Normocephalic.  Eyes:     Extraocular Movements: Extraocular movements intact.  Cardiovascular:     Rate and Rhythm: Normal rate.  Pulmonary:     Effort: Pulmonary effort is normal.  Skin:    General: Skin is warm and dry.  Neurological:     Mental Status: She is alert and oriented to person, place, and time.  Psychiatric:        Mood and Affect: Mood normal.        Behavior: Behavior normal.      ASSESSMENT & PLAN: A total of 42 minutes was spent with the patient and counseling/coordination of care regarding preparing for this visit, review of most recent office visit notes, most recent emergency department visit notes, review of most recent imaging reports from brain and lumbar spine, pain management, review of multiple chronic medical conditions and their management, review of all medications, review of most recent bloodwork results,  review of health maintenance items, education on nutrition, prognosis, documentation, and need for follow up.   Problem List Items Addressed This Visit       Cardiovascular and Mediastinum   Essential hypertension (Chronic)    BP Readings from Last 3 Encounters:  07/29/23 126/84  07/20/23 (!) 152/72  07/19/23 138/74  Well-controlled hypertension Continue amlodipine 5 mg daily and Hyzaar 100-12.5 mg daily         Nervous and Auditory   Left sided sciatica    Better controlled.  Still having intermittent pains Lumbar spine MRI shows degenerative disc disease worse at L4-5 with left L5 nerve root encroachment      Relevant Medications   ketorolac (TORADOL) injection 60 mg     Musculoskeletal and Integument   Degeneration of intervertebral disc of lumbar region with discogenic back pain and lower extremity pain - Primary    Creating chronic pain and affecting quality of life Was evaluated by orthopedics yesterday Plan is for epidural injections MRI of lumbar spine report reviewed with patient Worse findings at L4-5 levels with asymmetric disc bulging and resulting left L5 nerve root encroachment      Relevant Medications   ketorolac (TORADOL) injection 60 mg     Other   Morbid obesity (HCC) (Chronic)    Contributing to present condition  diet and nutrition discussed Advised to the amount of daily carbohydrate intake and daily calories and increase amount of plant-based protein in her die      Left hip pain   Relevant Medications   ketorolac (TORADOL) injection 60 mg   Patient Instructions  Degenerative Disk Disease  Degenerative disk disease is a condition caused by changes that occur in the spinal disks as a person ages. Spinal disks are soft and compressible disks located between the bones of your spine (vertebrae). These disks act like shock absorbers. Degenerative disk disease can affect the whole spine. However, the neck and lower back are most often affected.  Many changes can occur in the spinal  disks with aging, such as: The spinal disks may dry and shrink. Small tears may occur in the tough, outer covering of the disk (annulus). The disk space may become smaller due to loss of water. Abnormal growths in the bone (spurs) may occur. This can put pressure on the nerve roots exiting the spinal canal, causing pain. The spinal canal may become narrowed. What are the causes? This condition may be caused by: Normal degeneration with age. Injuries. Certain activities and sports that cause damage. What increases the risk? The following factors may make you more likely to develop this condition: Being overweight. Having a family history of degenerative disk disease. Smoking and use of products that contain nicotine and tobacco. Sudden injury. Doing work that requires heavy lifting. What are the signs or symptoms? Symptoms of this condition include: Pain that varies in intensity. Some people have no pain, while others have severe pain. The location of the pain depends on the part of your backbone that is affected. You may have: Pain in your neck or arm if a disk in your neck area is affected. Pain in your back, buttocks, or legs if a disk in your lower back is affected. Pain that becomes worse while bending or reaching up, or with twisting movements. Pain that may start gradually and worsen as time passes. It may also start after a major or minor injury. Numbness or tingling in the arms or legs. How is this diagnosed? This condition may be diagnosed based on: Your symptoms and medical history. A physical exam. Imaging tests, including: X-ray of the spine. CT scan. MRI. How is this treated? This condition may be treated with: Medicines. Injection of steroids into the back. Rehabilitation exercises. These activities aim to strengthen muscles in your back and abdomen to better support your spine. If treatments do not help to relieve your  symptoms or you have severe pain, you may need surgery. Follow these instructions at home: Medicines Take over-the-counter and prescription medicines only as told by your health care provider. Ask your health care provider if the medicine prescribed to you: Requires you to avoid driving or using machinery. Can cause constipation. You may need to take these actions to prevent or treat constipation: Drink enough fluid to keep your urine pale yellow. Take over-the-counter or prescription medicines. Eat foods that are high in fiber, such as beans, whole grains, and fresh fruits and vegetables. Limit foods that are high in fat and processed sugars, such as fried or sweet foods. Activity Rest as told by your health care provider. Avoid sitting for a long time without moving. Get up to take short walks every 1-2 hours. This is important to improve blood flow and breathing. Ask for help if you feel weak or unsteady. Return to your normal activities as told by your health care provider. Ask your health care provider what activities are safe for you. Perform relaxation exercises as told by your health care provider. Maintain good posture. Do not lift anything that is heavier than 10 lb (4.5 kg), or the limit that you are told, until your health care provider says that it is safe. Follow proper lifting and walking techniques as told by your health care provider. Managing pain, stiffness, and swelling     If directed, put ice on the painful area. Icing can help to relieve pain. To do this: Put ice in a plastic bag. Place a towel between your skin and the bag. Leave the ice on for 20 minutes, 2-3  times a day. Remove the ice if your skin turns bright red. This is very important. If you cannot feel pain, heat, or cold, you have a greater risk of damage to the area. If directed, apply heat to the painful area as often as told by your health care provider. Heat can reduce the stiffness of your muscles.  Use the heat source that your health care provider recommends, such as a moist heat pack or a heating pad. Place a towel between your skin and the heat source. Leave the heat on for 20-30 minutes. Remove the heat if your skin turns bright red. This is especially important if you are unable to feel pain, heat, or cold. You may have a greater risk of getting burned. General instructions Change your sitting, standing, and sleeping habits as told by your health care provider. Avoid sitting in the same position for long periods of time. Change positions frequently. Lose weight or maintain a healthy weight as told by your health care provider. Do not use any products that contain nicotine or tobacco, such as cigarettes, e-cigarettes, and chewing tobacco. If you need help quitting, ask your health care provider. Wear supportive footwear. Keep all follow-up visits. This is important. This may include visits for physical therapy. Contact a health care provider if you: Have pain that does not go away within 1-4 weeks. Lose your appetite. Lose weight without trying. Get help right away if you: Have severe pain. Notice weakness in your arms, hands, or legs. Begin to lose control of your bladder or bowel movements. Have fevers or night sweats. Summary Degenerative disk disease is a condition caused by changes that occur in the spinal disks as a person ages. This condition can affect the whole spine. However, the neck and lower back are most often affected. Take over-the-counter and prescription medicines only as told by your health care provider. This information is not intended to replace advice given to you by your health care provider. Make sure you discuss any questions you have with your health care provider. Document Revised: 12/01/2019 Document Reviewed: 12/01/2019 Elsevier Patient Education  2024 Else[employer]      Edwina Barth, MD  Primary Care at [facility]

## 2023-08-03 ENCOUNTER — Encounter: Payer: Self-pay | Admitting: Emergency Medicine

## 2023-08-03 NOTE — Telephone Encounter (Signed)
Is it because of low back pain?  If so, recommend she reaches out to her orthopedist.  Thanks.

## 2023-08-04 ENCOUNTER — Ambulatory Visit (INDEPENDENT_AMBULATORY_CARE_PROVIDER_SITE_OTHER): Payer: 59 | Admitting: Emergency Medicine

## 2023-08-04 ENCOUNTER — Ambulatory Visit (INDEPENDENT_AMBULATORY_CARE_PROVIDER_SITE_OTHER): Payer: 59

## 2023-08-04 ENCOUNTER — Other Ambulatory Visit (HOSPITAL_COMMUNITY): Payer: Self-pay

## 2023-08-04 ENCOUNTER — Encounter: Payer: Self-pay | Admitting: Emergency Medicine

## 2023-08-04 VITALS — BP 130/88 | HR 94 | Temp 98.0°F | Ht 65.0 in | Wt 261.8 lb

## 2023-08-04 DIAGNOSIS — I10 Essential (primary) hypertension: Secondary | ICD-10-CM

## 2023-08-04 DIAGNOSIS — R0609 Other forms of dyspnea: Secondary | ICD-10-CM | POA: Diagnosis not present

## 2023-08-04 DIAGNOSIS — R6889 Other general symptoms and signs: Secondary | ICD-10-CM

## 2023-08-04 DIAGNOSIS — R140 Abdominal distension (gaseous): Secondary | ICD-10-CM | POA: Diagnosis not present

## 2023-08-04 DIAGNOSIS — R059 Cough, unspecified: Secondary | ICD-10-CM | POA: Diagnosis not present

## 2023-08-04 DIAGNOSIS — R079 Chest pain, unspecified: Secondary | ICD-10-CM | POA: Diagnosis not present

## 2023-08-04 LAB — COMPREHENSIVE METABOLIC PANEL
ALT: 31 U/L (ref 0–35)
AST: 23 U/L (ref 0–37)
Albumin: 4 g/dL (ref 3.5–5.2)
Alkaline Phosphatase: 57 U/L (ref 39–117)
BUN: 8 mg/dL (ref 6–23)
CO2: 26 meq/L (ref 19–32)
Calcium: 8.9 mg/dL (ref 8.4–10.5)
Chloride: 106 meq/L (ref 96–112)
Creatinine, Ser: 0.83 mg/dL (ref 0.40–1.20)
GFR: 80.77 mL/min (ref >60.00)
Glucose, Bld: 105 mg/dL — ABNORMAL HIGH (ref 70–99)
Potassium: 3.6 meq/L (ref 3.5–5.1)
Sodium: 139 meq/L (ref 135–145)
Total Bilirubin: 0.7 mg/dL (ref 0.2–1.2)
Total Protein: 7.1 g/dL (ref 6.0–8.3)

## 2023-08-04 LAB — CBC WITH DIFFERENTIAL/PLATELET
Basophils Absolute: 0 10*3/uL (ref 0.0–0.1)
Basophils Relative: 0.5 % (ref 0.0–3.0)
Eosinophils Absolute: 0.1 10*3/uL (ref 0.0–0.7)
Eosinophils Relative: 1.2 % (ref 0.0–5.0)
HCT: 41 % (ref 36.0–46.0)
Hemoglobin: 13.4 g/dL (ref 12.0–15.0)
Lymphocytes Relative: 26.9 % (ref 12.0–46.0)
Lymphs Abs: 2.1 10*3/uL (ref 0.7–4.0)
MCHC: 32.6 g/dL (ref 30.0–36.0)
MCV: 81.9 fL (ref 78.0–100.0)
Monocytes Absolute: 0.4 10*3/uL (ref 0.1–1.0)
Monocytes Relative: 5.6 % (ref 3.0–12.0)
Neutro Abs: 5.2 10*3/uL (ref 1.4–7.7)
Neutrophils Relative %: 65.8 % (ref 43.0–77.0)
Platelets: 240 10*3/uL (ref 150.0–400.0)
RBC: 5.01 Mil/uL (ref 3.87–5.11)
RDW: 13.8 % (ref 11.5–15.5)
WBC: 7.9 10*3/uL (ref 4.0–10.5)

## 2023-08-04 LAB — POCT INFLUENZA A/B
Influenza A, POC: NEGATIVE
Influenza B, POC: NEGATIVE

## 2023-08-04 LAB — POC COVID19 BINAXNOW: SARS Coronavirus 2 Ag: NEGATIVE

## 2023-08-04 MED ORDER — PREDNISONE 20 MG PO TABS
40.0000 mg | ORAL_TABLET | Freq: Every day | ORAL | 0 refills | Status: AC
  Filled 2023-08-04: qty 10, 5d supply, fill #0

## 2023-08-04 MED ORDER — ALBUTEROL SULFATE HFA 108 (90 BASE) MCG/ACT IN AERS
2.0000 | INHALATION_SPRAY | Freq: Two times a day (BID) | RESPIRATORY_TRACT | 0 refills | Status: DC
  Filled 2023-08-04: qty 6.7, 17d supply, fill #0

## 2023-08-04 NOTE — Progress Notes (Signed)
Erin Gallagher 52 y.o.   Chief Complaint  Patient presents with   Shortness of Breath    SOB with everything she does. Nausea with some dizziness, no appetite, diarrhea started Friday evening.   Last she felt like this she said she had the FLU    HISTORY OF PRESENT ILLNESS: This is a 52 y.o. female complaining of flulike symptoms that started last Friday and peaked last Saturday with dyspnea on exertion Also complaining of nausea with dizziness and no appetite.  Positive diarrhea.  No vomiting. No other associated symptoms No other complaints or medical concerns today.   Shortness of Breath Associated symptoms include headaches. Pertinent negatives include no chest pain, fever, hemoptysis, sputum production, vomiting or wheezing.     Prior to Admission medications   Medication Sig Start Date End Date Taking? Authorizing Provider  amLODipine (NORVASC) 5 MG tablet Take 1 tablet (5 mg total) by mouth daily. 03/11/23  Yes Kayin Osment, Eilleen Kempf, MD  cyclobenzaprine (FLEXERIL) 10 MG tablet Take 1 tablet (10 mg total) by mouth at bedtime. 07/01/23  Yes Haven Foss, Eilleen Kempf, MD  diclofenac Sodium (VOLTAREN) 1 % GEL APPLY 2 GRAMS TO THE AFFECTED AREA(S) BY TOPICAL ROUTE 2-3 TIMES PER DAY 06/29/23  Yes   losartan-hydrochlorothiazide (HYZAAR) 100-12.5 MG tablet Take 1 tablet by mouth daily. 03/11/23  Yes Aleni Andrus, Eilleen Kempf, MD  nitroGLYCERIN (NITROSTAT) 0.4 MG SL tablet Place 1 tablet (0.4 mg total) under the tongue every 5 (five) minutes as needed for chest pain. 02/02/23  Yes Marykay Lex, MD  tamoxifen (NOLVADEX) 20 MG tablet Take 1 tablet (20 mg total) by mouth daily. 07/20/23  Yes Serena Croissant, MD  VITAMIN D PO Take 1,000 Units by mouth daily.   Yes [provider]    Allergies  Allergen Reactions   Other Rash    Steri Strips cause itching and a rash    Patient Active Problem List   Diagnosis Date Noted   Degeneration of intervertebral disc of lumbar region with  discogenic back pain and lower extremity pain 07/29/2023   Left hip pain 07/01/2023   Left sided sciatica 07/01/2023   Bilateral swelling of feet and ankles 03/11/2023   Metabolic syndrome 02/08/2023   DOE (dyspnea on exertion) 02/02/2023   Situational hypertension 02/02/2023   Family history of coronary artery disease 12/18/2022   Pes planus 05/13/2022   ANA positive 01/20/2022   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   Suspected sleep apnea 10/30/2021   BMI 40.0-44.9, adult (HCC) 02/06/2021   Morbid obesity (HCC) 02/06/2021   History of breast cancer 02/06/2021   Malignant neoplasm of upper-outer quadrant of left breast in female, 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   Arthritis 11/23/2017   Essential hypertension 06/22/2013    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   Personal history of radiation therapy     Past Surgical History:  Procedure Laterality Date   BREAST BIOPSY Left 05/16/2019   BREAST LUMPECTOMY Left 06/14/2019   BREAST LUMPECTOMY WITH RADIOACTIVE SEED AND SENTINEL LYMPH NODE  BIOPSY Left 06/14/2019   Procedure: LEFT BREAST LUMPECTOMY WITH RADIOACTIVE SEED AND SENTINEL LYMPH NODE BIOPSY;  Surgeon: Almond Lint, MD;  Location: [facility];  Service: General;  Laterality: Left;   CESAREAN SECTION     1997   CHOLECYSTECTOMY     TUBAL LIGATION     1998    Social History   Socioeconomic History   Marital status: Married    Spouse name: Not on file   Number of children: 1   Years of education: Not on file   Highest education level: 12th grade  Occupational  History   Occupation: Merchandiser, retail  Tobacco Use   Smoking status: Never    Passive exposure: Current   Smokeless tobacco: Never  Vaping Use   Vaping status: Never Used  Substance and Sexual Activity   Alcohol use: Yes    Alcohol/week: 2.0 standard drinks of alcohol    Types: 2 Standard drinks or equivalent per week    Comment: occasionally - 2 out of a 7 day period   Drug use: No   Sexual activity: Yes    Birth control/protection: Surgical    Comment: Tubal ligation  Other Topics Concern   Not on file  Social History Narrative   Caffiene 1 cup coffee daily.    Lives home with spouse   Works at [employer]   Education 5yrs   Children one.    Social Determinants of Health   Financial Resource Strain: Low Risk  (06/30/2023)   Overall Financial Resource Strain (CARDIA)    Difficulty of Paying Living Expenses: Not hard at all  Food Insecurity: No Food Insecurity (06/30/2023)   Hunger Vital Sign    Worried About Running Out of Food in the Last Year: Never true    Ran Out of Food in the Last Year: Never true  Transportation Needs: No Transportation Needs (06/30/2023)   PRAPARE - Administrator, Civil Service (Medical): No    Lack of Transportation (Non-Medical): No  Physical Activity: Unknown (06/30/2023)   Exercise Vital Sign    Days of Exercise per Week: Patient declined    Minutes of Exercise per Session: 30 min  Stress: Stress Concern Present (06/30/2023)   [employer] of Occupational Health - Occupational Stress Questionnaire    Feeling of Stress : To some extent  Social Connections: Unknown (06/30/2023)   Social Connection and Isolation Panel [NHANES]    Frequency of Communication with Friends and Family: More than three times a week    Frequency of Social Gatherings with Friends and Family: Twice a week    Attends Religious Services: Patient declined    Database administrator or Organizations: No    Attends Engineer, structural: Not on file     Marital Status: Married  Catering manager Violence: Not on file    Family History  Problem Relation Age of Onset   Hypertension Mother    Hypertension Father    Colon cancer Father        dx 79s   Throat cancer Maternal Uncle    Lung cancer Paternal Uncle    Stroke Maternal Grandmother    Hypertension Maternal Grandmother    Aneurysm Maternal Grandmother    Healthy Son      Review of Systems  Constitutional: Negative.  Negative for chills and fever.  HENT:  Positive for congestion.   Respiratory:  Positive for cough and shortness of breath. Negative for hemoptysis, sputum production and wheezing.  Cardiovascular: Negative.  Negative for chest pain and palpitations.  Gastrointestinal:  Positive for diarrhea and nausea. Negative for vomiting.  Genitourinary: Negative.  Negative for dysuria.  Neurological:  Positive for headaches.  All other systems reviewed and are negative.   Today's Vitals   08/04/23 1107  BP: 130/88  Pulse: 94  Temp: 98 F (36.7 C)  TempSrc: Oral  SpO2: 94%  Weight: 261 lb 12.8 oz (118.8 kg)  Height: 5\' 5"  (1.651 m)   Body mass index is 43.57 kg/m.   Physical Exam Vitals reviewed.  Constitutional:      Appearance: Normal appearance.  HENT:     Head: Normocephalic.     Mouth/Throat:     Mouth: Mucous membranes are moist.     Pharynx: Oropharynx is clear.  Eyes:     Extraocular Movements: Extraocular movements intact.     Pupils: Pupils are equal, round, and reactive to light.  Cardiovascular:     Rate and Rhythm: Normal rate and regular rhythm.     Pulses: Normal pulses.     Heart sounds: Normal heart sounds.  Pulmonary:     Effort: Pulmonary effort is normal.     Breath sounds: Normal breath sounds.  Musculoskeletal:     Cervical back: No tenderness.  Lymphadenopathy:     Cervical: No cervical adenopathy.  Skin:    General: Skin is warm and dry.     Capillary Refill: Capillary refill takes less than 2 seconds.   Neurological:     General: No focal deficit present.     Mental Status: She is alert and oriented to person, place, and time.  Psychiatric:        Mood and Affect: Mood normal.        Behavior: Behavior normal.   Results for orders placed or performed in visit on 08/04/23 (from the past 24 hour(s))  POCT Influenza A/B     Status: Normal   Collection Time: 08/04/23 11:38 AM  Result Value Ref Range   Influenza A, POC Negative Negative   Influenza B, POC Negative Negative  POC COVID-19     Status: None   Collection Time: 08/04/23 11:38 AM  Result Value Ref Range   SARS Coronavirus 2 Ag Negative Negative    DG Chest 2 View  Result Date: 08/04/2023 CLINICAL DATA:  52 year old female with cough, chest pain, shortness of breath. EXAM: CHEST - 2 VIEW COMPARISON:  Chest radiographs 12/18/2022 and earlier. FINDINGS: PA and lateral views 1157 hours. Low normal lung volumes. Mediastinal contours remain normal. Visualized tracheal air column is within normal limits. Lung markings appear stable, both lungs appear clear. No pneumothorax or pleural effusion. Stable cholecystectomy clips. No acute osseous abnormality identified. Paucity of bowel gas. IMPRESSION: Negative.  No acute cardiopulmonary abnormality. Electronically Signed   By: Odessa Fleming M.D.   On: 08/04/2023 12:20     ASSESSMENT & PLAN: A total of 42 minutes was spent with the patient and counseling/coordination of care regarding preparing for this visit, review of most recent office visit notes, review of multiple chronic medical conditions and their management, review of all medications, differential diagnosis of dyspnea on exertion including possibility of pulmonary embolism given her risk factors, review of most recent bloodwork results, review of health maintenance items, education on nutrition, prognosis, documentation, and need for follow up if no better or worse during the next several days.   Problem List Items Addressed This Visit        Cardiovascular and  Mediastinum   Essential hypertension (Chronic)    BP Readings from Last 3 Encounters:  08/04/23 130/88  07/29/23 126/84  07/20/23 (!) 152/72  Well-controlled hypertension Continue amlodipine 5 mg daily and Hyzaar 100-12.5 mg daily           Other   DOE (dyspnea on exertion) - Primary    Clinically stable.  Afebrile. Stable vital signs.  Good O2 sat. Chest x-ray done today.  Report reviewed. Most likely secondary to viral respiratory infection. Recommend to start prednisone 40 mg daily for 5 days and albuterol inhaler twice a day for 5 days ED precautions given. Advised to rest and stay well-hydrated Advised to contact the office if no better or worse during the next several days Recommend blood work today Negative COVID and flu tests.      Relevant Medications   predniSONE (DELTASONE) 20 MG tablet   albuterol (VENTOLIN HFA) 108 (90 Base) MCG/ACT inhaler   Other Relevant Orders   POCT Influenza A/B (Completed)   POC COVID-19 (Completed)   CBC with Differential/Platelet   Comprehensive metabolic panel   DG Chest 2 View   Flu-like symptoms    Symptom management discussed Advised to rest and stay well-hydrated Negative COVID and flu tests. ED precautions given      Relevant Orders   POCT Influenza A/B (Completed)   POC COVID-19 (Completed)   CBC with Differential/Platelet   Comprehensive metabolic panel   DG Chest 2 View       Patient Instructions  Shortness of Breath, Adult Shortness of breath means you have trouble breathing. Shortness of breath could be a sign of a medical problem. Follow these instructions at home:  Pollution Do not smoke or use any products that contain nicotine or tobacco. If you need help quitting, ask your doctor. Avoid things that can make it harder to breathe, such as: Smoke of all kinds. This includes smoke from campfires or forest fires. Do not smoke or allow others to smoke in your  home. Mold. Dust. Air pollution. Chemical smells. Things that can give you an allergic reaction (allergens) if you have allergies. Keep your living space clean. Use products that help remove mold and dust. General instructions Watch for any changes in your symptoms. Take over-the-counter and prescription medicines only as told by your doctor. This includes oxygen therapy and inhaled medicines. Rest as needed. Return to your normal activities when your doctor says that it is safe. Keep all follow-up visits. Contact a doctor if: Your condition does not get better as soon as expected. You have a hard time doing your normal activities, even after you rest. You have new symptoms. You cannot walk up stairs. You cannot exercise the way you normally do. Get help right away if: Your shortness of breath gets worse. You have trouble breathing when you are resting. You feel light-headed or you faint. You have a cough that is not helped by medicines. You cough up blood. You have pain with breathing. You have pain in your chest, arms, shoulders, or belly (abdomen). You have a fever. These symptoms may be an emergency. Get help right away. Call 911. Do not wait to see if the symptoms will go away. Do not drive yourself to the hospital. Summary Shortness of breath is when you have trouble breathing enough air. It can be a sign of a medical problem. Avoid things that make it hard for you to breathe, such as smoking, pollution, mold, and dust. Watch for any changes in  your symptoms. Contact your doctor if you do not get better or you get worse. This information is not intended to replace advice given to you by your health care provider. Make sure you discuss any questions you have with your health care provider. Document Revised: 04/06/2021 Document Reviewed: 04/06/2021 Elsevier Patient Education  2024 Elsevier Inc.    Edwina Barth, MD [facility]

## 2023-08-04 NOTE — Assessment & Plan Note (Signed)
Symptom management discussed Advised to rest and stay well-hydrated Negative COVID and flu tests. ED precautions given

## 2023-08-04 NOTE — Patient Instructions (Signed)
Shortness of Breath, Adult Shortness of breath means you have trouble breathing. Shortness of breath could be a sign of a medical problem. Follow these instructions at home:  Pollution Do not smoke or use any products that contain nicotine or tobacco. If you need help quitting, ask your doctor. Avoid things that can make it harder to breathe, such as: Smoke of all kinds. This includes smoke from campfires or forest fires. Do not smoke or allow others to smoke in your home. Mold. Dust. Air pollution. Chemical smells. Things that can give you an allergic reaction (allergens) if you have allergies. Keep your living space clean. Use products that help remove mold and dust. General instructions Watch for any changes in your symptoms. Take over-the-counter and prescription medicines only as told by your doctor. This includes oxygen therapy and inhaled medicines. Rest as needed. Return to your normal activities when your doctor says that it is safe. Keep all follow-up visits. Contact a doctor if: Your condition does not get better as soon as expected. You have a hard time doing your normal activities, even after you rest. You have new symptoms. You cannot walk up stairs. You cannot exercise the way you normally do. Get help right away if: Your shortness of breath gets worse. You have trouble breathing when you are resting. You feel light-headed or you faint. You have a cough that is not helped by medicines. You cough up blood. You have pain with breathing. You have pain in your chest, arms, shoulders, or belly (abdomen). You have a fever. These symptoms may be an emergency. Get help right away. Call 911. Do not wait to see if the symptoms will go away. Do not drive yourself to the hospital. Summary Shortness of breath is when you have trouble breathing enough air. It can be a sign of a medical problem. Avoid things that make it hard for you to breathe, such as smoking, pollution,  mold, and dust. Watch for any changes in your symptoms. Contact your doctor if you do not get better or you get worse. This information is not intended to replace advice given to you by your health care provider. Make sure you discuss any questions you have with your health care provider. Document Revised: 04/06/2021 Document Reviewed: 04/06/2021 Elsevier Patient Education  2024 [employer].

## 2023-08-04 NOTE — Assessment & Plan Note (Signed)
Clinically stable.  Afebrile. Stable vital signs.  Good O2 sat. Chest x-ray done today.  Report reviewed. Most likely secondary to viral respiratory infection. Recommend to start prednisone 40 mg daily for 5 days and albuterol inhaler twice a day for 5 days ED precautions given. Advised to rest and stay well-hydrated Advised to contact the office if no better or worse during the next several days Recommend blood work today Negative COVID and flu tests.

## 2023-08-04 NOTE — Assessment & Plan Note (Signed)
BP Readings from Last 3 Encounters:  08/04/23 130/88  07/29/23 126/84  07/20/23 (!) 152/72  Well-controlled hypertension Continue amlodipine 5 mg daily and Hyzaar 100-12.5 mg daily

## 2023-08-06 ENCOUNTER — Ambulatory Visit
Admission: RE | Admit: 2023-08-06 | Discharge: 2023-08-06 | Disposition: A | Discharge status: Home / Self Care | Payer: 59 | Source: Ambulatory Visit | Attending: Emergency Medicine | Admitting: Emergency Medicine

## 2023-08-06 DIAGNOSIS — Z1231 Encounter for screening mammogram for malignant neoplasm of breast: Secondary | ICD-10-CM

## 2023-08-06 NOTE — Telephone Encounter (Signed)
We can do that after her viral illness is better

## 2023-08-14 ENCOUNTER — Other Ambulatory Visit: Payer: Self-pay | Admitting: Emergency Medicine

## 2023-08-14 DIAGNOSIS — R0609 Other forms of dyspnea: Secondary | ICD-10-CM

## 2023-08-16 ENCOUNTER — Other Ambulatory Visit: Payer: Self-pay | Admitting: Emergency Medicine

## 2023-08-16 DIAGNOSIS — R0609 Other forms of dyspnea: Secondary | ICD-10-CM

## 2023-08-16 MED ORDER — ALBUTEROL SULFATE HFA 108 (90 BASE) MCG/ACT IN AERS
2.0000 | INHALATION_SPRAY | Freq: Two times a day (BID) | RESPIRATORY_TRACT | 0 refills | Status: DC
  Filled 2023-08-16: qty 6.7, 17d supply, fill #0
  Filled 2023-08-17: qty 6.7, 20d supply, fill #0

## 2023-08-17 ENCOUNTER — Other Ambulatory Visit (HOSPITAL_COMMUNITY): Payer: Self-pay

## 2023-08-17 ENCOUNTER — Encounter: Payer: Self-pay | Admitting: Emergency Medicine

## 2023-08-17 ENCOUNTER — Ambulatory Visit (INDEPENDENT_AMBULATORY_CARE_PROVIDER_SITE_OTHER): Payer: 59 | Admitting: Emergency Medicine

## 2023-08-17 VITALS — BP 126/88 | HR 105 | Temp 97.9°F | Ht 65.0 in | Wt 261.0 lb

## 2023-08-17 DIAGNOSIS — R0609 Other forms of dyspnea: Secondary | ICD-10-CM | POA: Diagnosis not present

## 2023-08-17 DIAGNOSIS — I10 Essential (primary) hypertension: Secondary | ICD-10-CM | POA: Diagnosis not present

## 2023-08-17 NOTE — Patient Instructions (Signed)
Expect call to schedule chest CT scan to rule out pulmonary embolism Contact the office if not contacted within the next 48 hours Go to emergency department if symptoms worsen  Shortness of Breath, Adult Shortness of breath means you have trouble breathing. Shortness of breath could be a sign of a medical problem. Follow these instructions at home:  Pollution Do not smoke or use any products that contain nicotine or tobacco. If you need help quitting, ask your doctor. Avoid things that can make it harder to breathe, such as: Smoke of all kinds. This includes smoke from campfires or forest fires. Do not smoke or allow others to smoke in your home. Mold. Dust. Air pollution. Chemical smells. Things that can give you an allergic reaction (allergens) if you have allergies. Keep your living space clean. Use products that help remove mold and dust. General instructions Watch for any changes in your symptoms. Take over-the-counter and prescription medicines only as told by your doctor. This includes oxygen therapy and inhaled medicines. Rest as needed. Return to your normal activities when your doctor says that it is safe. Keep all follow-up visits. Contact a doctor if: Your condition does not get better as soon as expected. You have a hard time doing your normal activities, even after you rest. You have new symptoms. You cannot walk up stairs. You cannot exercise the way you normally do. Get help right away if: Your shortness of breath gets worse. You have trouble breathing when you are resting. You feel light-headed or you faint. You have a cough that is not helped by medicines. You cough up blood. You have pain with breathing. You have pain in your chest, arms, shoulders, or belly (abdomen). You have a fever. These symptoms may be an emergency. Get help right away. Call 911. Do not wait to see if the symptoms will go away. Do not drive yourself to the  hospital. Summary Shortness of breath is when you have trouble breathing enough air. It can be a sign of a medical problem. Avoid things that make it hard for you to breathe, such as smoking, pollution, mold, and dust. Watch for any changes in your symptoms. Contact your doctor if you do not get better or you get worse. This information is not intended to replace advice given to you by your health care provider. Make sure you discuss any questions you have with your health care provider. Document Revised: 04/06/2021 Document Reviewed: 04/06/2021 Elsevier Patient Education  2024 [employer].

## 2023-08-17 NOTE — Progress Notes (Signed)
Erin Gallagher 52 y.o.   Chief Complaint  Patient presents with   Shortness of Breath    Patient states she she constant SOB . Inhaler does help a little    Cough    HISTORY OF PRESENT ILLNESS: This is a 52 y.o. female here for follow-up of office visit on 08/14/2023 when she presented with complaint of difficulty breathing.  Mostly dyspnea on exertion. Took prednisone and bronchodilators as prescribed without significant improvement Chest x-ray was unremarkable. No new symptoms just persistent ones.  HPI   Prior to Admission medications   Medication Sig Start Date End Date Taking? Authorizing Provider  albuterol (VENTOLIN HFA) 108 (90 Base) MCG/ACT inhaler Inhale 2 puffs into the lungs 2 (two) times daily for 5 days. 08/16/23 09/03/23 Yes Georgean Spainhower, Eilleen Kempf, MD  amLODipine (NORVASC) 5 MG tablet Take 1 tablet (5 mg total) by mouth daily. 03/11/23  Yes Gailen Venne, Eilleen Kempf, MD  cyclobenzaprine (FLEXERIL) 10 MG tablet Take 1 tablet (10 mg total) by mouth at bedtime. 07/01/23  Yes Shatiqua Heroux, Eilleen Kempf, MD  diclofenac Sodium (VOLTAREN) 1 % GEL APPLY 2 GRAMS TO THE AFFECTED AREA(S) BY TOPICAL ROUTE 2-3 TIMES PER DAY 06/29/23  Yes   losartan-hydrochlorothiazide (HYZAAR) 100-12.5 MG tablet Take 1 tablet by mouth daily. 03/11/23  Yes Keari Miu, Eilleen Kempf, MD  nitroGLYCERIN (NITROSTAT) 0.4 MG SL tablet Place 1 tablet (0.4 mg total) under the tongue every 5 (five) minutes as needed for chest pain. 02/02/23  Yes Marykay Lex, MD  tamoxifen (NOLVADEX) 20 MG tablet Take 1 tablet (20 mg total) by mouth daily. 07/20/23  Yes Serena Croissant, MD  VITAMIN D PO Take 1,000 Units by mouth daily.   Yes [provider]    Allergies  Allergen Reactions   Other Rash    Steri Strips cause itching and a rash    Patient Active Problem List   Diagnosis Date Noted   Flu-like symptoms 08/04/2023   Degeneration of intervertebral disc of lumbar region with discogenic back pain and lower extremity  pain 07/29/2023   Left hip pain 07/01/2023   Left sided sciatica 07/01/2023   Bilateral swelling of feet and ankles 03/11/2023   Metabolic syndrome 02/08/2023   DOE (dyspnea on exertion) 02/02/2023   Situational hypertension 02/02/2023   Family history of coronary artery disease 12/18/2022   Pes planus 05/13/2022   ANA positive 01/20/2022   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   Suspected sleep apnea 10/30/2021   BMI 40.0-44.9, adult (HCC) 02/06/2021   Morbid obesity (HCC) 02/06/2021   History of breast cancer 02/06/2021   Malignant neoplasm of upper-outer quadrant of left breast in female, 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   Arthritis 11/23/2017   Essential hypertension 06/22/2013    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   Personal history of radiation therapy     Past Surgical History:  Procedure Laterality Date   BREAST BIOPSY Left 05/16/2019   BREAST LUMPECTOMY Left 06/14/2019   BREAST LUMPECTOMY WITH RADIOACTIVE SEED AND SENTINEL LYMPH NODE BIOPSY Left  06/14/2019   Procedure: LEFT BREAST LUMPECTOMY WITH RADIOACTIVE SEED AND SENTINEL LYMPH NODE BIOPSY;  Surgeon: Almond Lint, MD;  Location: [facility];  Service: General;  Laterality: Left;   CESAREAN SECTION     1997   CHOLECYSTECTOMY     TUBAL LIGATION     1998    Social History   Socioeconomic History   Marital status: Married    Spouse name: Not on file   Number of children: 1   Years of education: Not on file   Highest education level: 12th grade  Occupational History   Occupation: Merchandiser, retail  Tobacco  Use   Smoking status: Never    Passive exposure: Current   Smokeless tobacco: Never  Vaping Use   Vaping status: Never Used  Substance and Sexual Activity   Alcohol use: Yes    Alcohol/week: 2.0 standard drinks of alcohol    Types: 2 Standard drinks or equivalent per week    Comment: occasionally - 2 out of a 7 day period   Drug use: No   Sexual activity: Yes    Birth control/protection: Surgical    Comment: Tubal ligation  Other Topics Concern   Not on file  Social History Narrative   Caffiene 1 cup coffee daily.    Lives home with spouse   Works at [employer]   Education 31yrs   Children one.    Social Drivers of Corporate investment banker Strain: Low Risk  (08/16/2023)   Overall Financial Resource Strain (CARDIA)    Difficulty of Paying Living Expenses: Not very hard  Food Insecurity: No Food Insecurity (08/16/2023)   Hunger Vital Sign    Worried About Running Out of Food in the Last Year: Never true    Ran Out of Food in the Last Year: Never true  Transportation Needs: No Transportation Needs (08/16/2023)   PRAPARE - Administrator, Civil Service (Medical): No    Lack of Transportation (Non-Medical): No  Physical Activity: Insufficiently Active (08/16/2023)   Exercise Vital Sign    Days of Exercise per Week: 3 days    Minutes of Exercise per Session: 30 min  Stress: No Stress Concern Present (08/16/2023)   [employer] of Occupational Health - Occupational Stress Questionnaire    Feeling of Stress : Only a little  Recent Concern: Stress - Stress Concern Present (06/30/2023)   [employer] of Occupational Health - Occupational Stress Questionnaire    Feeling of Stress : To some extent  Social Connections: Unknown (08/16/2023)   Social Connection and Isolation Panel [NHANES]    Frequency of Communication with Friends and Family: More than three times a week    Frequency of Social Gatherings with Friends and Family: Twice a week    Attends  Religious Services: Patient declined    Database administrator or Organizations: No    Attends Engineer, structural: Not on file    Marital Status: Married  Catering manager Violence: Not on file    Family History  Problem Relation Age of Onset   Hypertension Mother    Hypertension Father    Colon cancer Father        dx 36s   Throat cancer Maternal Uncle    Lung cancer Paternal Uncle    Stroke Maternal Grandmother    Hypertension Maternal Grandmother    Aneurysm Maternal Grandmother    Healthy Son      Review of Systems  Constitutional: Negative.  Negative for chills and fever.  HENT:  Positive for congestion.   Respiratory:  Positive for shortness of breath. Negative for cough, hemoptysis, sputum production and wheezing.   Cardiovascular: Negative.  Negative for chest pain and palpitations.  Gastrointestinal:  Negative for abdominal pain, diarrhea, nausea and vomiting.  Genitourinary: Negative.  Negative for dysuria and hematuria.  Neurological: Negative.  Negative for dizziness and headaches.  All other systems reviewed and are negative.   Vitals:   08/17/23 1026  BP: 126/88  Pulse: (!) 105  Temp: 97.9 F (36.6 C)  SpO2: 92%    Physical Exam Vitals reviewed.  Constitutional:      Appearance: Normal appearance. She is obese.  HENT:     Head: Normocephalic.     Nose: Congestion present.     Mouth/Throat:     Mouth: Mucous membranes are moist.     Pharynx: Oropharynx is clear.  Eyes:     Extraocular Movements: Extraocular movements intact.     Conjunctiva/sclera: Conjunctivae normal.     Pupils: Pupils are equal, round, and reactive to light.  Cardiovascular:     Rate and Rhythm: Normal rate and regular rhythm.     Pulses: Normal pulses.     Heart sounds: Normal heart sounds.  Pulmonary:     Effort: Pulmonary effort is normal.     Breath sounds: Normal breath sounds.  Musculoskeletal:     Cervical back: No tenderness.  Lymphadenopathy:      Cervical: No cervical adenopathy.  Skin:    General: Skin is warm and dry.     Capillary Refill: Capillary refill takes less than 2 seconds.  Neurological:     General: No focal deficit present.     Mental Status: She is alert and oriented to person, place, and time.  Psychiatric:        Mood and Affect: Mood normal.        Behavior: Behavior normal.      ASSESSMENT & PLAN: A total of 46 minutes was spent with the patient and counseling/coordination of care regarding preparing for this visit, review of most recent office visit notes, review of multiple chronic medical conditions and their management, review of all medications, review of most recent chest x-ray report, differential diagnosis including possibility of pulmonary embolism, review of most recent bloodwork results, review of health maintenance items, education on nutrition, prognosis, ED precautions, documentation, and need for follow up.   Problem List Items Addressed This Visit       Cardiovascular and Mediastinum   Essential hypertension (Chronic)   Well-controlled hypertension Continue amlodipine 5 mg daily and Hyzaar 100-12.5 mg daily BP Readings from Last 3 Encounters:  08/17/23 126/88  08/04/23 130/88  07/29/23 126/84           Other   Morbid obesity (HCC) (Chronic)   Contributing to present condition  diet and nutrition discussed Advised to the amount of daily carbohydrate intake and daily calories and increase amount of plant-based protein in her die      DOE (dyspnea on exertion) - Primary   Persistent symptoms over the last 3 weeks Patient has history of breast cancer.  Normal recent mammogram. Non-smoker.  Unremarkable chest x-ray done on 08/04/2023 Mild tachycardia on examination.  O2 sat on the low side 92% Differential diagnosis discussed including possibility of pulmonary embolism ED precautions discussed Recommend stat CTA of chest to rule out PE      Relevant Orders   CT Angio Chest W/Cm  &/  Or Wo Cm   Patient Instructions  Expect call to schedule chest CT scan to rule out pulmonary embolism Contact the office if not contacted within the next 48 hours Go to emergency department if symptoms worsen  Shortness of Breath, Adult Shortness of breath means you have trouble breathing. Shortness of breath could be a sign of a medical problem. Follow these instructions at home:  Pollution Do not smoke or use any products that contain nicotine or tobacco. If you need help quitting, ask your doctor. Avoid things that can make it harder to breathe, such as: Smoke of all kinds. This includes smoke from campfires or forest fires. Do not smoke or allow others to smoke in your home. Mold. Dust. Air pollution. Chemical smells. Things that can give you an allergic reaction (allergens) if you have allergies. Keep your living space clean. Use products that help remove mold and dust. General instructions Watch for any changes in your symptoms. Take over-the-counter and prescription medicines only as told by your doctor. This includes oxygen therapy and inhaled medicines. Rest as needed. Return to your normal activities when your doctor says that it is safe. Keep all follow-up visits. Contact a doctor if: Your condition does not get better as soon as expected. You have a hard time doing your normal activities, even after you rest. You have new symptoms. You cannot walk up stairs. You cannot exercise the way you normally do. Get help right away if: Your shortness of breath gets worse. You have trouble breathing when you are resting. You feel light-headed or you faint. You have a cough that is not helped by medicines. You cough up blood. You have pain with breathing. You have pain in your chest, arms, shoulders, or belly (abdomen). You have a fever. These symptoms may be an emergency. Get help right away. Call 911. Do not wait to see if the symptoms will go away. Do not drive  yourself to the hospital. Summary Shortness of breath is when you have trouble breathing enough air. It can be a sign of a medical problem. Avoid things that make it hard for you to breathe, such as smoking, pollution, mold, and dust. Watch for any changes in your symptoms. Contact your doctor if you do not get better or you get worse. This information is not intended to replace advice given to you by your health care provider. Make sure you discuss any questions you have with your health care provider. Document Revised: 04/06/2021 Document Reviewed: 04/06/2021 Elsevier Patient Education  2024 Elsevier Inc.       Edwina Barth, MD [facility]

## 2023-08-17 NOTE — Assessment & Plan Note (Signed)
 Contributing to present condition  diet and nutrition discussed Advised to the amount of daily carbohydrate intake and daily calories and increase amount of plant-based protein in her die

## 2023-08-17 NOTE — Assessment & Plan Note (Signed)
Well-controlled hypertension Continue amlodipine 5 mg daily and Hyzaar 100-12.5 mg daily BP Readings from Last 3 Encounters:  08/17/23 126/88  08/04/23 130/88  07/29/23 126/84

## 2023-08-17 NOTE — Assessment & Plan Note (Signed)
Persistent symptoms over the last 3 weeks Patient has history of breast cancer.  Normal recent mammogram. Non-smoker.  Unremarkable chest x-ray done on 08/04/2023 Mild tachycardia on examination.  O2 sat on the low side 92% Differential diagnosis discussed including possibility of pulmonary embolism ED precautions discussed Recommend stat CTA of chest to rule out PE

## 2023-08-18 DIAGNOSIS — M67962 Unspecified disorder of synovium and tendon, left lower leg: Secondary | ICD-10-CM | POA: Diagnosis not present

## 2023-08-18 DIAGNOSIS — M67961 Unspecified disorder of synovium and tendon, right lower leg: Secondary | ICD-10-CM | POA: Diagnosis not present

## 2023-08-18 DIAGNOSIS — M79671 Pain in right foot: Secondary | ICD-10-CM | POA: Diagnosis not present

## 2023-08-18 DIAGNOSIS — M79672 Pain in left foot: Secondary | ICD-10-CM | POA: Diagnosis not present

## 2023-08-19 ENCOUNTER — Inpatient Hospital Stay (HOSPITAL_COMMUNITY)
Admission: EM | Admit: 2023-08-19 | Discharge: 2023-08-22 | DRG: 176 | Disposition: A | Discharge status: Home / Self Care | Payer: 59 | Attending: Family Medicine | Admitting: Family Medicine

## 2023-08-19 ENCOUNTER — Telehealth: Payer: Self-pay

## 2023-08-19 ENCOUNTER — Emergency Department (HOSPITAL_COMMUNITY): Payer: 59

## 2023-08-19 ENCOUNTER — Other Ambulatory Visit: Payer: Self-pay

## 2023-08-19 ENCOUNTER — Encounter (HOSPITAL_COMMUNITY): Payer: Self-pay

## 2023-08-19 ENCOUNTER — Ambulatory Visit
Admission: RE | Admit: 2023-08-19 | Discharge: 2023-08-19 | Disposition: A | Discharge status: Home / Self Care | Payer: 59 | Source: Ambulatory Visit | Attending: Emergency Medicine | Admitting: Emergency Medicine

## 2023-08-19 DIAGNOSIS — Z91048 Other nonmedicinal substance allergy status: Secondary | ICD-10-CM

## 2023-08-19 DIAGNOSIS — Z8249 Family history of ischemic heart disease and other diseases of the circulatory system: Secondary | ICD-10-CM

## 2023-08-19 DIAGNOSIS — Z808 Family history of malignant neoplasm of other organs or systems: Secondary | ICD-10-CM

## 2023-08-19 DIAGNOSIS — Z923 Personal history of irradiation: Secondary | ICD-10-CM

## 2023-08-19 DIAGNOSIS — Z853 Personal history of malignant neoplasm of breast: Secondary | ICD-10-CM

## 2023-08-19 DIAGNOSIS — R29818 Other symptoms and signs involving the nervous system: Secondary | ICD-10-CM | POA: Diagnosis not present

## 2023-08-19 DIAGNOSIS — Z823 Family history of stroke: Secondary | ICD-10-CM

## 2023-08-19 DIAGNOSIS — I739 Peripheral vascular disease, unspecified: Secondary | ICD-10-CM | POA: Diagnosis not present

## 2023-08-19 DIAGNOSIS — R768 Other specified abnormal immunological findings in serum: Secondary | ICD-10-CM | POA: Diagnosis present

## 2023-08-19 DIAGNOSIS — Z7901 Long term (current) use of anticoagulants: Secondary | ICD-10-CM

## 2023-08-19 DIAGNOSIS — R0602 Shortness of breath: Secondary | ICD-10-CM | POA: Diagnosis not present

## 2023-08-19 DIAGNOSIS — R9431 Abnormal electrocardiogram [ECG] [EKG]: Secondary | ICD-10-CM | POA: Diagnosis present

## 2023-08-19 DIAGNOSIS — Z79899 Other long term (current) drug therapy: Secondary | ICD-10-CM

## 2023-08-19 DIAGNOSIS — I2510 Atherosclerotic heart disease of native coronary artery without angina pectoris: Secondary | ICD-10-CM | POA: Diagnosis present

## 2023-08-19 DIAGNOSIS — R0609 Other forms of dyspnea: Secondary | ICD-10-CM

## 2023-08-19 DIAGNOSIS — I2699 Other pulmonary embolism without acute cor pulmonale: Secondary | ICD-10-CM | POA: Diagnosis present

## 2023-08-19 DIAGNOSIS — G4733 Obstructive sleep apnea (adult) (pediatric): Secondary | ICD-10-CM | POA: Diagnosis present

## 2023-08-19 DIAGNOSIS — Z800 Family history of malignant neoplasm of digestive organs: Secondary | ICD-10-CM

## 2023-08-19 DIAGNOSIS — Z803 Family history of malignant neoplasm of breast: Secondary | ICD-10-CM

## 2023-08-19 DIAGNOSIS — I2692 Saddle embolus of pulmonary artery without acute cor pulmonale: Principal | ICD-10-CM | POA: Diagnosis present

## 2023-08-19 DIAGNOSIS — Z79810 Long term (current) use of selective estrogen receptor modulators (SERMs): Secondary | ICD-10-CM

## 2023-08-19 DIAGNOSIS — M310 Hypersensitivity angiitis: Secondary | ICD-10-CM | POA: Diagnosis present

## 2023-08-19 DIAGNOSIS — Z801 Family history of malignant neoplasm of trachea, bronchus and lung: Secondary | ICD-10-CM

## 2023-08-19 DIAGNOSIS — I82451 Acute embolism and thrombosis of right peroneal vein: Secondary | ICD-10-CM | POA: Diagnosis present

## 2023-08-19 DIAGNOSIS — Z91199 Patient's noncompliance with other medical treatment and regimen due to unspecified reason: Secondary | ICD-10-CM

## 2023-08-19 DIAGNOSIS — I10 Essential (primary) hypertension: Secondary | ICD-10-CM | POA: Diagnosis present

## 2023-08-19 DIAGNOSIS — Z832 Family history of diseases of the blood and blood-forming organs and certain disorders involving the immune mechanism: Secondary | ICD-10-CM

## 2023-08-19 DIAGNOSIS — Z6841 Body Mass Index (BMI) 40.0 and over, adult: Secondary | ICD-10-CM

## 2023-08-19 DIAGNOSIS — Z1152 Encounter for screening for COVID-19: Secondary | ICD-10-CM

## 2023-08-19 LAB — CBC
HCT: 48.1 % — ABNORMAL HIGH (ref 36.0–46.0)
Hemoglobin: 15 g/dL (ref 12.0–15.0)
MCH: 26.4 pg (ref 26.0–34.0)
MCHC: 31.2 g/dL (ref 30.0–36.0)
MCV: 84.7 fL (ref 80.0–100.0)
Platelets: 209 10*3/uL (ref 150–400)
RBC: 5.68 MIL/uL — ABNORMAL HIGH (ref 3.87–5.11)
RDW: 14.4 % (ref 11.5–15.5)
WBC: 10.7 10*3/uL — ABNORMAL HIGH (ref 4.0–10.5)
nRBC: 0 % (ref 0.0–0.2)

## 2023-08-19 LAB — BASIC METABOLIC PANEL
Anion gap: 9 (ref 5–15)
BUN: 11 mg/dL (ref 6–20)
CO2: 21 mmol/L — ABNORMAL LOW (ref 22–32)
Calcium: 8.9 mg/dL (ref 8.9–10.3)
Chloride: 106 mmol/L (ref 98–111)
Creatinine, Ser: 0.8 mg/dL (ref 0.44–1.00)
GFR, Estimated: >60 mL/min (ref >60)
Glucose, Bld: 98 mg/dL (ref 70–99)
Potassium: 3.8 mmol/L (ref 3.5–5.1)
Sodium: 136 mmol/L (ref 135–145)

## 2023-08-19 LAB — HEPATIC FUNCTION PANEL
ALT: 20 U/L (ref 0–44)
AST: 18 U/L (ref 15–41)
Albumin: 3.5 g/dL (ref 3.5–5.0)
Alkaline Phosphatase: 52 U/L (ref 38–126)
Bilirubin, Direct: <0.1 mg/dL (ref 0.0–0.2)
Total Bilirubin: 0.6 mg/dL (ref <1.2)
Total Protein: 7.1 g/dL (ref 6.5–8.1)

## 2023-08-19 LAB — PROTIME-INR
INR: 1.1 (ref 0.8–1.2)
Prothrombin Time: 14.7 s (ref 11.4–15.2)

## 2023-08-19 LAB — MAGNESIUM: Magnesium: 2 mg/dL (ref 1.7–2.4)

## 2023-08-19 LAB — BRAIN NATRIURETIC PEPTIDE: B Natriuretic Peptide: 165.2 pg/mL — ABNORMAL HIGH (ref 0.0–100.0)

## 2023-08-19 LAB — TROPONIN I (HIGH SENSITIVITY)
Troponin I (High Sensitivity): 15 ng/L (ref <18)
Troponin I (High Sensitivity): 21 ng/L — ABNORMAL HIGH (ref <18)
Troponin I (High Sensitivity): 21 ng/L — ABNORMAL HIGH (ref <18)

## 2023-08-19 LAB — APTT: aPTT: 25 s (ref 24–36)

## 2023-08-19 LAB — PHOSPHORUS: Phosphorus: 3 mg/dL (ref 2.5–4.6)

## 2023-08-19 LAB — SARS CORONAVIRUS 2 BY RT PCR: SARS Coronavirus 2 by RT PCR: NEGATIVE

## 2023-08-19 MED ORDER — IOPAMIDOL (ISOVUE-370) INJECTION 76%
75.0000 mL | Freq: Once | INTRAVENOUS | Status: AC | PRN
  Administered 2023-08-19: 75 mL via INTRAVENOUS

## 2023-08-19 MED ORDER — OXYCODONE-ACETAMINOPHEN 10-325 MG PO TABS: 1.0000 | ORAL_TABLET | Freq: Three times a day (TID) | ORAL | Status: DC | PRN

## 2023-08-19 MED ORDER — FENTANYL CITRATE PF 50 MCG/ML IJ SOSY
12.5000 ug | PREFILLED_SYRINGE | INTRAMUSCULAR | Status: DC | PRN
  Administered 2023-08-20 – 2023-08-22 (×3): 50 ug via INTRAVENOUS
  Filled 2023-08-19 (×3): qty 1

## 2023-08-19 MED ORDER — OXYCODONE HCL 5 MG PO TABS
5.0000 mg | ORAL_TABLET | Freq: Three times a day (TID) | ORAL | Status: DC | PRN
  Administered 2023-08-21: 5 mg via ORAL
  Filled 2023-08-19: qty 1

## 2023-08-19 MED ORDER — ACETAMINOPHEN 650 MG RE SUPP: 650.0000 mg | Freq: Four times a day (QID) | RECTAL | Status: DC | PRN

## 2023-08-19 MED ORDER — HEPARIN (PORCINE) 25000 UT/250ML-% IV SOLN
1550.0000 [IU]/h | INTRAVENOUS | Status: AC
  Administered 2023-08-19 – 2023-08-22 (×5): 1550 [IU]/h via INTRAVENOUS
  Filled 2023-08-19 (×5): qty 250

## 2023-08-19 MED ORDER — HEPARIN BOLUS VIA INFUSION
5000.0000 [IU] | Freq: Once | INTRAVENOUS | Status: AC
  Administered 2023-08-19: 5000 [IU] via INTRAVENOUS
  Filled 2023-08-19: qty 5000

## 2023-08-19 MED ORDER — SODIUM CHLORIDE 0.9 % IV SOLN: INTRAVENOUS | Status: DC

## 2023-08-19 MED ORDER — OXYCODONE-ACETAMINOPHEN 5-325 MG PO TABS
1.0000 | ORAL_TABLET | Freq: Three times a day (TID) | ORAL | Status: DC | PRN
  Administered 2023-08-20 – 2023-08-22 (×3): 1 via ORAL
  Filled 2023-08-19 (×3): qty 1

## 2023-08-19 MED ORDER — ACETAMINOPHEN 325 MG PO TABS
650.0000 mg | ORAL_TABLET | Freq: Four times a day (QID) | ORAL | Status: DC | PRN
  Administered 2023-08-20 – 2023-08-21 (×2): 650 mg via ORAL
  Filled 2023-08-19 (×2): qty 2

## 2023-08-19 NOTE — Assessment & Plan Note (Signed)
Admit to  stepdown Initiate heparin drip  Would likely benefit from case manager consult for long term anticoagulation Hold home blood pressure medications avoid hypotension Cycle cardiac enzymes Order echogram and lower extremity Dopplers  Most likely risk factors for hypercoagulable state being  obesity hx of  malignancy ANA positive, Family hx of blood clots  Would benefit from hypercoagulable workup  and hematology follow up    Given  massive PE [facility] consulted per PCCM patient not a candidate for tPA They recommend admit to medicine

## 2023-08-19 NOTE — Assessment & Plan Note (Signed)
Hold off tamoxifen for tonight

## 2023-08-19 NOTE — ED Triage Notes (Signed)
Pt arrived reporting she was sent due to P.E. She has a CT scan earlier. Endorses shob and leg pain intermittently

## 2023-08-19 NOTE — Consult Note (Signed)
NAME:  Erin Gallagher, MRN:  [id number], DOB:  1971/06/23, LOS: 0 ADMISSION DATE:  08/19/2023, CONSULTATION DATE:  08/19/23 REFERRING MD:  Georgina Quint, MD CHIEF COMPLAINT:  Pulmonary embolism   History of Present Illness:  52 year old female with prior hx left breast cancer on tamoxifen who presents with 3 week history of shortness of breath and lower extremity swelling. This began after Thanksgiving when she began having left foot pain and became less mobile. Still able to ambulate within the house but not as frequently. Noticed the right lower leg became more swollen in this time. Shortness of breath with activity and associated with chest pain in the last week. Denies dizziness or syncope. Denies recent prolonged travel. Remote history of breast cancer 202 with no recurrence but remains on tamoxifen. Denies personal or family history of clot. In the ED hemodynamically stable with HR <100, SBP 150-160, SpO2 98% on room air. CBC and CMET unremarkable. Troponin 15. CTA with bilateral saddle pulmonary embolism with right heart strain.  Pertinent  Medical History  HTN, morbid obesity, hx left breast cancer IA in 2020 s/p lumpectomy and adjuvant radiation and tamoxifen  Significant Hospital Events: Including procedures, antibiotic start and stop dates in addition to other pertinent events     Interim History / Subjective:  As above  Objective   Blood pressure (!) 166/115, pulse (!) 106, temperature 98.3 F (36.8 C), temperature source Oral, resp. rate 16, height 5\' 6"  (1.676 m), weight 115.2 kg, last menstrual period 12/30/2021, SpO2 98%.       No intake or output data in the 24 hours ending 08/19/23 1843 Filed Weights   08/19/23 1653 08/19/23 1818  Weight: 118 kg 115.2 kg   Physical Exam: General: Well-appearing, no acute distress HENT: [place], AT Eyes: EOMI, no scleral icterus Respiratory: Clear to auscultation bilaterally.  No crackles, wheezing or rales Cardiovascular: RRR,  -M/R/G, no JVD Extremities: Nonpitting edema in RLE,-tenderness Neuro: AAO x4, CNII-XII grossly intact Psych: Normal mood, normal affect  Resolved Hospital Problem list   N/A  Assessment & Plan:   Bilateral pulmonary emboli CTA 08/19/23 Bilateral saddle pulmonary embolism Clinically HDS on room air PESI 82 points  Class II, Low Risk: 1.7-3.5% 30-day mortality in this group. -Start heparin gtt. Primary team to transition to DOAC in 24-48 hours. Will need 3-6 months as this is likely provoked in setting of immobility in setting of tamoxifen use -OK to admit to SDU -Echo ordered -LE dopplers ordered -Trend trop, BNP -Discussed anticoagulation options including systemic anticoagulation, potential thrombectomy and emergent lytics if she decompensates. Discussed risks and benefits of treatment. Would be a reasonable candidate for thrombectomy based on size and clot burden however with hemodynamic stability and negative markers so far, patient prefers to remain on systemic AC. If she decompensates, no contraindication to lytics (no recent surgery, no intracranial lesions, no stroke, no recent AC). -Consider Oncology consult to discuss continuation of tamoxifen  Pulmonary will continue to follow  Best Practice (right click and "Reselect all SmartList Selections" daily)   Per Primary  Labs   CBC: Recent Labs  Lab 08/19/23 1720  WBC 10.7*  HGB 15.0  HCT 48.1*  MCV 84.7  PLT 209    Basic Metabolic Panel: Recent Labs  Lab 08/19/23 1720  NA 136  K 3.8  CL 106  CO2 21*  GLUCOSE 98  BUN 11  CREATININE 0.80  CALCIUM 8.9   GFR: Estimated Creatinine Clearance: 106.1 mL/min (by C-G formula  based on SCr of 0.8 mg/dL). Recent Labs  Lab 08/19/23 1720  WBC 10.7*    Liver Function Tests: No results for input(s): "AST", "ALT", "ALKPHOS", "BILITOT", "PROT", "ALBUMIN" in the last 168 hours. No results for input(s): "LIPASE", "AMYLASE" in the last 168 hours. No results for  input(s): "AMMONIA" in the last 168 hours.  ABG    Component Value Date/Time   O2SAT 42.7 07/19/2023 0305     Coagulation Profile: Recent Labs  Lab 08/19/23 1720  INR 1.1    Cardiac Enzymes: No results for input(s): "CKTOTAL", "CKMB", "CKMBINDEX", "TROPONINI" in the last 168 hours.  HbA1C: Hgb A1c MFr Bld  Date/Time Value Ref Range Status  10/30/2021 02:31 PM 6.3 4.6 - 6.5 % Final    Comment:    Glycemic Control Guidelines for People with Diabetes:Non Diabetic:  <6%Goal of Therapy: <7%Additional Action Suggested:  >8%   06/01/2020 10:12 AM 6.1 (H) 4.8 - 5.6 % Final    Comment:             Prediabetes: 5.7 - 6.4          Diabetes: >6.4          Glycemic control for adults with diabetes: <7.0     CBG: No results for input(s): "GLUCAP" in the last 168 hours.  Review of Systems:   Review of Systems  Constitutional:  Negative for chills, diaphoresis, fever, malaise/fatigue and weight loss.  HENT:  Negative for congestion.   Respiratory:  Positive for shortness of breath. Negative for cough, hemoptysis, sputum production and wheezing.   Cardiovascular:  Positive for chest pain and leg swelling. Negative for palpitations.     Past Medical History:  She,  has a past medical history of ANA positive (01/20/2022), Arthritis, Blood transfusion without reported diagnosis, Cancer (HCC), Family history of breast cancer, Family history of colon cancer, Family history of lung cancer, Family history of throat cancer, Gall bladder disease, Hypertension, Leukocytoclastic vasculitis (HCC) (01/20/2022), and Personal history of radiation therapy.   Surgical History:   Past Surgical History:  Procedure Laterality Date   BREAST BIOPSY Left 05/16/2019   BREAST LUMPECTOMY Left 06/14/2019   BREAST LUMPECTOMY WITH RADIOACTIVE SEED AND SENTINEL LYMPH NODE BIOPSY Left 06/14/2019   Procedure: LEFT BREAST LUMPECTOMY WITH RADIOACTIVE SEED AND SENTINEL LYMPH NODE BIOPSY;  Surgeon: Almond Lint, MD;   Location: [facility];  Service: General;  Laterality: Left;   CESAREAN SECTION     1997   CHOLECYSTECTOMY     TUBAL LIGATION     1998     Social History:   reports that she has never smoked. She has been exposed to tobacco smoke. She has never used smokeless tobacco. She reports current alcohol use of about 2.0 standard drinks of alcohol per week. She reports that she does not use drugs.   Family History:  Her family history includes Aneurysm in her maternal grandmother; Colon cancer in her father; Healthy in her son; Hypertension in her father, maternal grandmother, and mother; Lung cancer in her paternal uncle; Stroke in her maternal grandmother; Throat cancer in her maternal uncle.   Allergies Allergies  Allergen Reactions   Other Itching, Rash and Other (See Comments)    Steri Strips cause itching and a rash     Home Medications  Prior to Admission medications   Medication Sig Start Date End Date Taking? Authorizing Provider  oxyCODONE-acetaminophen (PERCOCET) 10-325 MG tablet Take 1 tablet by mouth every 8 (eight) hours as needed  for pain.   Yes [provider]  TYLENOL 500 MG tablet Take 500-1,000 mg by mouth every 6 (six) hours as needed for mild pain (pain score 1-3) or headache.   Yes [provider]  albuterol (VENTOLIN HFA) 108 (90 Base) MCG/ACT inhaler Inhale 2 puffs into the lungs 2 (two) times daily for 5 days. Patient taking differently: Inhale 2 puffs into the lungs 2 (two) times daily as needed for wheezing or shortness of breath. 08/16/23 09/06/23 Yes Sagardia, Eilleen Kempf, MD  amLODipine (NORVASC) 5 MG tablet Take 1 tablet (5 mg total) by mouth daily. 03/11/23  Yes Sagardia, Eilleen Kempf, MD  cyclobenzaprine (FLEXERIL) 10 MG tablet Take 1 tablet (10 mg total) by mouth at bedtime. Patient taking differently: Take 10 mg by mouth at bedtime as needed for muscle spasms. 07/01/23  Yes Sagardia, Eilleen Kempf, MD  diclofenac Sodium (VOLTAREN) 1  % GEL APPLY 2 GRAMS TO THE AFFECTED AREA(S) BY TOPICAL ROUTE 2-3 TIMES PER DAY Patient not taking: Reported on 08/19/2023 06/29/23     losartan-hydrochlorothiazide (HYZAAR) 100-12.5 MG tablet Take 1 tablet by mouth daily. 03/11/23  Yes Sagardia, Eilleen Kempf, MD  nitroGLYCERIN (NITROSTAT) 0.4 MG SL tablet Place 1 tablet (0.4 mg total) under the tongue every 5 (five) minutes as needed for chest pain. 02/02/23  Yes Marykay Lex, MD  tamoxifen (NOLVADEX) 20 MG tablet Take 1 tablet (20 mg total) by mouth daily. 07/20/23  Yes Serena Croissant, MD  VITAMIN D PO Take 1,000 Units by mouth daily.   Yes [provider]     Critical care time: N/A    Care Time: Moderate  Mechele Collin, M.D. [facility] Pulmonary/Critical Care Medicine 08/19/2023 6:43 PM   See Amion for personal pager For hours between 7 PM to 7 AM, please call Elink for urgent questions

## 2023-08-19 NOTE — Assessment & Plan Note (Signed)
Noticed discrepancy in pulses worse in  left lower extremity Patient describes symptoms consistent with claudication Would benefit from ABIs and father follow-up and workup

## 2023-08-19 NOTE — Telephone Encounter (Signed)
CRITICAL VALUE STICKER  CRITICAL VALUE: Spoke to a Radiologist about pt imaging result. Pt tested for PE  RECEIVER (on-site recipient of call):   DATE & TIME NOTIFIED: 08/19/23 @ 3: 43pm   MESSENGER (representative from lab): Kennyth Arnold   MD NOTIFIED: yes, MyChart and secure message   Respond : Pt need to go to the ED TIME OF NOTIFICATION: 3:46 pm

## 2023-08-19 NOTE — Assessment & Plan Note (Signed)
Patient not compliant with daily CPAP

## 2023-08-19 NOTE — Subjective & Objective (Signed)
Since thanksgiving has had right leg swelling but left leg felt numb Mild CP , and SOB went to PCP who got CTA that showed saddle PE  No travel no prior hx of PE  Does not smoke, not on blood thinner  Reports left foot has been feeling numb and hurts more with exertion Decreased pulses on the left One family member had blood clots  Personal hx of breat cancer in 2020 sp lumpectomy, radiation

## 2023-08-19 NOTE — H&P (Addendum)
Erin Gallagher GNF:[id number] DOB: [birth year] DOA: 08/19/2023     PCP: Georgina Quint, MD   Outpatient Specialists:  CARDS: Dr. Bryan Lemma, MD    Oncology  Dr. Pamelia Hoit   Patient arrived to ER on 08/19/23 at 1629 Referred by Attending Therisa Doyne, MD   Patient coming from:    home Lives  With family      Chief Complaint:   Chief Complaint  Patient presents with   seny by md    HPI: Erin Gallagher is a 52 y.o. female with medical history significant of   Obesity, ANA positive, essential hypertension, breast cancer status postradiation and lumpectomy on tamoxifen, history of leukocytoclastic vasculitis mild CAD    Presented with shortness of breath and right leg swelling Since thanksgiving has had right leg swelling but left leg felt numb Mild CP , and SOB went to PCP who got CTA that showed saddle PE  No travel no prior hx of PE  Does not smoke, not on blood thinner  Reports left foot has been feeling numb and hurts more with exertion Decreased pulses on the left One family member had blood clots  Personal hx of breat cancer in 2020 sp lumpectomy, radiation   Denies significant ETOH intake   Does not smoke   Lab Results  Component Value Date   SARSCOV2NAA NOT DETECTED 06/10/2019        Regarding pertinent Chronic problems:       HTN on Norvasc and Hyzaar   last echo  Recent Results (from the past 84696 hours)  ECHOCARDIOGRAM COMPLETE   Collection Time: 03/09/23  1:18 PM  Result Value   Area-P 1/2 4.49   S' Lateral 2.30   Est EF 60 - 65%   Narrative      ECHOCARDIOGRAM REPORT       1. Left ventricular ejection fraction, by estimation, is 60 to 65%. The left ventricle has normal function. The left ventricle has no regional wall motion abnormalities. There is mild asymmetric left ventricular hypertrophy of the septal segment. Left  ventricular diastolic parameters were normal.  2. Right ventricular systolic function is normal. The  right ventricular size is normal.  3. The mitral valve is normal in structure. Trivial mitral valve regurgitation. No evidence of mitral stenosis.  4. The aortic valve is tricuspid. Aortic valve regurgitation is not visualized. No aortic stenosis is present.  5. The inferior vena cava is normal in size with greater than 50% respiratory variability, suggesting right atrial pressure of 3 mmHg.             CAD  -mild                -  followed by cardiology                        Morbid obesity-   BMI Readings from Last 1 Encounters:  08/19/23 41.00 kg/m       OSA -on nocturnal   CPAP sometimes uses twice a week but usually noncompliant    Cancer: breast cancer in 2020 s/p radiation therapy and lumpectomy currently on tamoxifen followed by Dr. Pamelia Hoit     While in ER:   Presents with CTA found to have saddle PE  There is moderate-to-large volume saddle pulmonary embolism with right heart strain. No lung infarction  Critical care was consulted patient hemodynamically stable troponin 15 no evidence of hypoxia tachycardia or hypotension.  Critical care seen patient in consult recommend admission to medicine heparin drip echo and Dopplers in the morning  In ED it was noted the patient does have right lower extremity swelling but left lower extremity diminished pulses palpation but dopplerable patient states she have had symptoms in that leg for quite some time   Lab Orders         Basic metabolic panel         CBC         Protime-INR         APTT         CBC         Heparin level (unfractionated)         Brain natriuretic peptide        CXR -  NON acute    CTA chest -moderate to large PE, with right heart strain Following Medications were ordered in ER: Medications  heparin ADULT infusion 100 units/mL (25000 units/267mL) (1,550 Units/hr Intravenous New Bag/Given 08/19/23 1803)  heparin bolus via infusion 5,000 Units (5,000 Units Intravenous Bolus from Bag 08/19/23 1803)     _______________________________________________________ ER Provider Called:    Critical care Dr.Chi They Recommend admit to medicine    SEEN in ER Would be a reasonable candidate for thrombectomy based on size and clot burden however with hemodynamic stability and negative markers so far, patient prefers to remain on systemic AC. If she decompensates, no contraindication to lytics (no recent surgery, no intracranial lesions, no stroke, no recent AC).     ED Triage Vitals  Encounter Vitals Group     BP 08/19/23 1637 (!) 166/115     Systolic BP Percentile --      Diastolic BP Percentile --      Pulse Rate 08/19/23 1637 (!) 106     Resp 08/19/23 1637 16     Temp 08/19/23 1637 98.3 F (36.8 C)     Temp Source 08/19/23 1637 Oral     SpO2 08/19/23 1637 98 %     Weight 08/19/23 1653 260 lb 2.3 oz (118 kg)     Height 08/19/23 1653 5\' 5"  (1.651 m)     Head Circumference --      Peak Flow --      Pain Score 08/19/23 1851 0     Pain Loc --      Pain Education --      Exclude from Growth Chart --   OACZ(66)@     _________________________________________ Significant initial  Findings: Abnormal Labs Reviewed  BASIC METABOLIC PANEL - Abnormal; Notable for the following components:      Result Value   CO2 21 (*)    All other components within normal limits  CBC - Abnormal; Notable for the following components:   WBC 10.7 (*)    RBC 5.68 (*)    HCT 48.1 (*)    All other components within normal limits      _________________________ Troponin  ordered Cardiac Panel (last 3 results) Recent Labs    08/19/23 1720 08/19/23 1830  TROPONINIHS 15 21*     ECG: Ordered Personally reviewed and interpreted by me showing: HR : 98 Rhythm: Sinus rhythm Inferior infarct, age indeterminate Probable anterior infarct, age indeterminate S1? Q3T3 pattern with inverted T waves in precordial leads Prolonged QT interval QTC 533  BNP (last 3 results) No results for input(s): "BNP" in the last  8760 hours.   COVID-19 Labs  No results for input(s): "DDIMER", "FERRITIN", "LDH", "  CRP" in the last 72 hours.  Lab Results  Component Value Date   SARSCOV2NAA NOT DETECTED 06/10/2019     The recent clinical data is shown below. Vitals:   08/19/23 1653 08/19/23 1818 08/19/23 1845 08/19/23 1900  BP:   (!) 155/97 (!) 159/96  Pulse:   91 95  Resp:   19 19  Temp:      TempSrc:      SpO2:   98% 96%  Weight: 118 kg 115.2 kg    Height: 5\' 5"  (1.651 m) 5\' 6"  (1.676 m)      WBC     Component Value Date/Time   WBC 10.7 (H) 08/19/2023 1720   LYMPHSABS 2.1 08/04/2023 1151   LYMPHSABS 2.0 11/23/2017 1648   MONOABS 0.4 08/04/2023 1151   EOSABS 0.1 08/04/2023 1151   EOSABS 0.1 11/23/2017 1648   BASOSABS 0.0 08/04/2023 1151   BASOSABS 0.0 11/23/2017 1648       UA   ordered     Results for orders placed or performed in visit on 12/25/20  Urine Culture     Status: None   Collection Time: 12/25/20  2:42 PM  Result Value Ref Range Status   MICRO NUMBER: [id number]  Final   SPECIMEN QUALITY: Adequate  Final   Sample Source URINE  Final   STATUS: FINAL  Final   Result: No Growth  Final   COMMENT:   Final    Due to supply chain limits, the laboratory is temporarily performing urine cultures from FDA approved preservative tubes that have not been validated by [employer], as well as from refrigerated sterile urine collection cups that do not contain a  preservative. Culture of unpreserved urine may produce falsely elevated bacterial counts.    _____________________________________________________ Recent Labs  Lab 08/19/23 1720  NA 136  K 3.8  CO2 21*  GLUCOSE 98  BUN 11  CREATININE 0.80  CALCIUM 8.9    Cr  stable,  Lab Results  Component Value Date   CREATININE 0.80 08/19/2023   CREATININE 0.83 08/04/2023   CREATININE 0.73 07/18/2023    No results for input(s): "AST", "ALT", "ALKPHOS", "BILITOT", "PROT", "ALBUMIN" in the last 168 hours. Lab Results  Component  Value Date   CALCIUM 8.9 08/19/2023       Plt: Lab Results  Component Value Date   PLT 209 08/19/2023      Recent Labs  Lab 08/19/23 1720  WBC 10.7*  HGB 15.0  HCT 48.1*  MCV 84.7  PLT 209    HG/HCT  stable,       Component Value Date/Time   HGB 15.0 08/19/2023 1720   HGB 13.4 06/01/2020 1012   HCT 48.1 (H) 08/19/2023 1720   HCT 41.8 06/01/2020 1012   MCV 84.7 08/19/2023 1720   MCV 83 06/01/2020 1012    _______________________________________________ Hospitalist was called for admission for   Saddle embolus of pulmonary artery,     The following Work up has been ordered so far:  Orders Placed This Encounter  Procedures   DG Chest Portable 1 View   Basic metabolic panel   CBC   Protime-INR   APTT   CBC   Heparin level (unfractionated)   Brain natriuretic peptide   Document Height and Actual Weight   Cardiac Monitoring - Continuous Indefinite   heparin per pharmacy consult   Consult to intensivist   Consult to hospitalist   ED EKG   EKG 12-Lead   EKG 12-Lead  ECHOCARDIOGRAM COMPLETE   Place in observation (patient's expected length of stay will be less than 2 midnights)   VAS [place] LOWER EXTREMITY VENOUS (DVT)     OTHER Significant initial  Findings:  labs showing:     DM  labs:  HbA1C: No results for input(s): "HGBA1C" in the last 8760 hours.     CBG (last 3)  No results for input(s): "GLUCAP" in the last 72 hours.        Cultures:    Component Value Date/Time   SDES ABSCESS LEFT BREAST 02/17/2020 1530   SPECREQUEST NONE 02/17/2020 1530   CULT  02/17/2020 1530    No growth aerobically or anaerobically. Performed at [facility], [address] [place], [place] [address]    REPTSTATUS 02/22/2020 FINAL 02/17/2020 1530     Radiological Exams on Admission: DG Chest Portable 1 View Result Date: 08/19/2023 CLINICAL DATA:  Shortness of breath EXAM: PORTABLE CHEST 1 VIEW COMPARISON:  08/04/2023 FINDINGS: The heart size and mediastinal  contours are within normal limits. Both lungs are clear. The visualized skeletal structures are unremarkable. IMPRESSION: No active disease. Electronically Signed   By: Jasmine Pang M.D.   On: 08/19/2023 19:13   CT Angio Chest W/Cm &/Or Wo Cm Addendum Date: 08/19/2023 ADDENDUM REPORT: 08/19/2023 15:45 ADDENDUM: Critical Value/emergent results were called by telephone at the time of interpretation on 08/19/2023 at 3:44 pm to CMA Melida Quitter. She will convey the message to provider MIGUEL SAGARDIA. The report is already available for review. Electronically Signed   By: Jules Schick M.D.   On: 08/19/2023 15:45   Result Date: 08/19/2023 CLINICAL DATA:  Pulmonary embolism (PE) suspected, high prob. EXAM: CT ANGIOGRAPHY CHEST WITH CONTRAST TECHNIQUE: Multidetector CT imaging of the chest was performed using the standard protocol during bolus administration of intravenous contrast. Multiplanar CT image reconstructions and MIPs were obtained to evaluate the vascular anatomy. RADIATION DOSE REDUCTION: This exam was performed according to the departmental dose-optimization program which includes automated exposure control, adjustment of the mA and/or kV according to patient size and/or use of iterative reconstruction technique. CONTRAST:  75mL ISOVUE-370 IOPAMIDOL (ISOVUE-370) INJECTION 76% COMPARISON:  None Available. FINDINGS: Cardiovascular: Examination is limited due to suboptimal contrast enhancement. However, having said that, there is moderate-to-large saddle pulmonary embolism extending from the main pulmonary trunk into the bilateral lobar, segmental and subsegmental pulmonary artery branches. There is right heart strain. No lung infarction. Normal cardiac size. No pericardial effusion. No aortic aneurysm. Mediastinum/Nodes: Visualized thyroid gland appears grossly unremarkable. No solid / cystic mediastinal masses. The esophagus is nondistended precluding optimal assessment. No axillary, mediastinal or  hilar lymphadenopathy by size criteria. Lungs/Pleura: The central tracheo-bronchial tree is patent. There are patchy areas of linear, plate-like atelectasis and/or scarring throughout bilateral lungs. No mass or consolidation. No pleural effusion or pneumothorax. No suspicious lung nodules. Upper Abdomen: There is mild diffuse hepatic steatosis. Surgically absent gallbladder. Remaining visualized upper abdominal viscera within normal limits. Musculoskeletal: The visualized soft tissues of the chest wall are grossly unremarkable. No suspicious osseous lesions. There are mild multilevel degenerative changes in the visualized spine. Review of the MIP images confirms the above findings. IMPRESSION: *There is moderate-to-large volume saddle pulmonary embolism with right heart strain. No lung infarction. *Multiple other nonacute observations, as described above. Electronically Signed: By: Jules Schick M.D. On: 08/19/2023 15:35   _______________________________________________________________________________________________________ Latest  Blood pressure (!) 159/96, pulse 95, temperature 98.3 F (36.8 C), temperature source Oral, resp. rate 19, height 5'  6" (1.676 m), weight 115.2 kg, last menstrual period 12/30/2021, SpO2 96%.   Vitals  labs and radiology finding personally reviewed  Review of Systems:    Pertinent positives include:  chest pain, shortness of breath at rest. dyspnea on exertion, leg swelling  Constitutional:  No weight loss, night sweats, Fevers, chills, fatigue, weight loss  HEENT:  No headaches, Difficulty swallowing,Tooth/dental problems,Sore throat,  No sneezing, itching, ear ache, nasal congestion, post nasal drip,  Cardio-vascular:  No Orthopnea, PND, anasarca, dizziness, palpitations.no Bilateral lower extremity swelling  GI:  No heartburn, indigestion, abdominal pain, nausea, vomiting, diarrhea, change in bowel habits, loss of appetite, melena, blood in stool,  hematemesis Resp:  no , No excess mucus, no productive cough, No non-productive cough, No coughing up of blood.No change in color of mucus.No wheezing. Skin:  no rash or lesions. No jaundice GU:  no dysuria, change in color of urine, no urgency or frequency. No straining to urinate.  No flank pain.  Musculoskeletal:  No joint pain or no joint swelling. No decreased range of motion. No back pain.  Psych:  No change in mood or affect. No depression or anxiety. No memory loss.  Neuro: no localizing neurological complaints, no tingling, no weakness, no double vision, no gait abnormality, no slurred speech, no confusion  All systems reviewed and apart from HOPI all are negative _______________________________________________________________________________________________ Past Medical History:   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   Personal history of radiation therapy     Past Surgical History:  Procedure Laterality Date   BREAST BIOPSY Left 05/16/2019   BREAST LUMPECTOMY Left 06/14/2019   BREAST LUMPECTOMY WITH RADIOACTIVE SEED AND SENTINEL LYMPH NODE BIOPSY Left 06/14/2019   Procedure: LEFT BREAST LUMPECTOMY WITH RADIOACTIVE SEED AND SENTINEL LYMPH NODE BIOPSY;  Surgeon: Almond Lint, MD;  Location: [facility];  Service: General;  Laterality: Left;   CESAREAN SECTION     1997   CHOLECYSTECTOMY     TUBAL LIGATION     1998    Social History:  Ambulatory   independently     reports that she has never smoked. She has been exposed to tobacco smoke. She has never used smokeless tobacco. She  reports current alcohol use of about 2.0 standard drinks of alcohol per week. She reports that she does not use drugs.    Family History:   Family History  Problem Relation Age of Onset   Hypertension Mother    Hypertension Father    Colon cancer Father        dx 8s   Throat cancer Maternal Uncle    Lung cancer Paternal Uncle    Stroke Maternal Grandmother    Hypertension Maternal Grandmother    Aneurysm Maternal Grandmother    Healthy Son    ______________________________________________________________________________________________ Allergies: Allergies  Allergen Reactions   Other Itching, Rash and Other (See Comments)    Steri Strips cause itching and a rash     Prior to Admission medications   Medication Sig Start Date End Date Taking?  Authorizing Provider  albuterol (VENTOLIN HFA) 108 (90 Base) MCG/ACT inhaler Inhale 2 puffs into the lungs 2 (two) times daily for 5 days. Patient taking differently: Inhale 2 puffs into the lungs 2 (two) times daily as needed for wheezing or shortness of breath. 08/16/23 09/06/23 Yes Sagardia, Eilleen Kempf, MD  amLODipine (NORVASC) 5 MG tablet Take 1 tablet (5 mg total) by mouth daily. 03/11/23  Yes Sagardia, Eilleen Kempf, MD  cyclobenzaprine (FLEXERIL) 10 MG tablet Take 1 tablet (10 mg total) by mouth at bedtime. Patient taking differently: Take 10 mg by mouth at bedtime as needed for muscle spasms. 07/01/23  Yes Sagardia, Eilleen Kempf, MD  losartan-hydrochlorothiazide (HYZAAR) 100-12.5 MG tablet Take 1 tablet by mouth daily. 03/11/23  Yes Sagardia, Eilleen Kempf, MD  nitroGLYCERIN (NITROSTAT) 0.4 MG SL tablet Place 1 tablet (0.4 mg total) under the tongue every 5 (five) minutes as needed for chest pain. 02/02/23  Yes Marykay Lex, MD  oxyCODONE-acetaminophen (PERCOCET) 10-325 MG tablet Take 1 tablet by mouth every 8 (eight) hours as needed for pain.   Yes [provider]  tamoxifen (NOLVADEX) 20 MG tablet Take 1 tablet (20 mg total) by  mouth daily. 07/20/23  Yes Serena Croissant, MD  TYLENOL 500 MG tablet Take 500-1,000 mg by mouth every 6 (six) hours as needed for mild pain (pain score 1-3) or headache.   Yes [provider]  VITAMIN D PO Take 1,000 Units by mouth daily.   Yes [provider]  diclofenac Sodium (VOLTAREN) 1 % GEL APPLY 2 GRAMS TO THE AFFECTED AREA(S) BY TOPICAL ROUTE 2-3 TIMES PER DAY Patient not taking: Reported on 08/19/2023 06/29/23       ___________________________________________________________________________________________________ Physical Exam:    08/19/2023    7:00 PM 08/19/2023    6:45 PM 08/19/2023    6:18 PM  Vitals with BMI  Height   5\' 6"   Weight   254 lbs  BMI   41.02  Systolic 159 155   Diastolic 96 97   Pulse 95 91      1. General:  in No  Acute distress   well -appearing 2. Psychological: Alert and   Oriented 3. Head/ENT:   Moist  Mucous Membranes                          Head Non traumatic, neck supple                          Normal  Dentition 4. SKIN: normal   Skin turgor,  Skin clean Dry and intact no rash    5. Heart: Regular rate and rhythm no  Murmur, no Rub or gallop 6. Lungs:  Clear to auscultation bilaterally, no wheezes or crackles   7. Abdomen: Soft,  non-tender, Non distended   obese  bowel sounds present 8. Lower extremities: no clubbing, cyanosis,  edema right > left, pulses in left diminished 9. Neurologically Grossly intact, moving all 4 extremities equally   10. MSK: Normal range of motion    Chart has been reviewed  ______________________________________________________________________________________________  Assessment/Plan 52 y.o. female with medical history significant of   Obesity, ANA positive, essential hypertension, breast cancer status postradiation and lumpectomy on tamoxifen, history of leukocytoclastic vasculitis mild CAD  Admitted for   Saddle embolus of pulmonary artery,      Present on Admission:  Pulmonary  embolism without acute cor pulmonale (HCC)  Morbid obesity (HCC)  Suspected sleep apnea  ANA positive  Left leg claudication (HCC)  Prolonged QT interval  Essential hypertension     Pulmonary embolism without acute cor pulmonale (HCC)  Admit to  stepdown Initiate heparin drip  Would likely benefit from case manager consult for long term anticoagulation Hold home blood pressure medications avoid hypotension Cycle cardiac enzymes Order echogram and lower extremity Dopplers  Most likely risk factors for hypercoagulable state being  obesity hx of  malignancy ANA positive, Family hx of blood clots  Would benefit from hypercoagulable workup  and hematology follow up    Given  massive PE [facility] M consulted per PCCM patient not a candidate for tPA They recommend admit to medicine   Morbid obesity (HCC) Contributing to comorbidity and complicating medical management  Nutritional follow up as an out pt  Suspected sleep apnea Patient not compliant with daily CPAP  ANA positive Would need further workup Testing for antiphospholipid antibody would be helpful Hematology follow-up  Left leg claudication (HCC) Noticed discrepancy in pulses worse in  left lower extremity Patient describes symptoms consistent with claudication Would benefit from ABIs and father follow-up and workup  History of breast cancer Hold off tamoxifen for tonight   Prolonged QT interval - will monitor on tele avoid QT prolonging medications, rehydrate correct electrolytes   Essential hypertension Allow permissive HTN    Other plan as per orders.  DVT prophylaxis:  heparin   Code Status:    Code Status: Not on file FULL CODE  patient   I had personally discussed CODE STATUS with patient and family*  ACP   none    Family Communication:   Family   at  Bedside  plan of care was discussed  with mother  Diet heart healthy   Disposition Plan:    To home once workup is complete and patient is stable    Following barriers for discharge:                            PE work up is complete                         Pt is able to transition po AC                                                        Will need consultants to evaluate patient prior to discharge       Consult Orders  (From admission, onward)           Start     Ordered   08/19/23 1839  Consult to hospitalist  Once       Provider:  (Not yet assigned)  Question Answer Comment  Place call to: [facility]   Reason for Consult Admit      08/19/23 1838                  Consults called: critical care consulted Emailed oncology      Admission status:  ED Disposition     ED Disposition  Admit   Condition  --   Comment  Hospital Area: [facility] [[id number]]  Level of Care: Stepdown [14]  Admit to SDU based on following criteria: Hemodynamic compromise  or significant risk of instability:  Patient requiring short term acute titration and management of vasoactive drips, and invasive monitoring (i.e., CVP and Arterial line).  May place patient in observation at [facility] or [facility] if equivalent level of care is available:: No  Covid Evaluation: Asymptomatic - no recent exposure (last 10 days) testing not required  Diagnosis: Pulmonary embolism without acute cor pulmonale [facility]) [[id number]]  Admitting Physician: Therisa Doyne [[id number]]  Attending Physician: Therisa Doyne [[id number]]          Obs    Level of care    stepdown   tele indefinitely please discontinue once patient no longer qualifies COVID-19 Labs    Jedrick Hutcherson 08/19/2023, 8:27 PM    [facility]     after 2 AM please page floor coverage PA If 7AM-7PM, please contact the day team taking care of the patient using [email or web address]

## 2023-08-19 NOTE — Assessment & Plan Note (Addendum)
Contributing to comorbidity and complicating medical management  Nutritional follow up as an out pt

## 2023-08-19 NOTE — Assessment & Plan Note (Signed)
-  will monitor on tele avoid QT prolonging medications, rehydrate correct electrolytes ? ?

## 2023-08-19 NOTE — Progress Notes (Signed)
PHARMACY - ANTICOAGULATION CONSULT NOTE  Pharmacy Consult for Heparin Indication:  saddle PE  Allergies  Allergen Reactions   Other Rash    Steri Strips cause itching and a rash    Patient Measurements: Height: 5\' 5"  (165.1 cm) Weight: 118 kg (260 lb 2.3 oz) IBW/kg (Calculated) : 57 Heparin Dosing Weight: 85 kg  Vital Signs: Temp: 98.3 F (36.8 C) (12/18 1637) Temp Source: Oral (12/18 1637) BP: 166/115 (12/18 1637) Pulse Rate: 106 (12/18 1637)  Labs: No results for input(s): "HGB", "HCT", "PLT", "APTT", "LABPROT", "INR", "HEPARINUNFRC", "HEPRLOWMOCWT", "CREATININE", "CKTOTAL", "CKMB", "TROPONINIHS" in the last 72 hours.  Estimated Creatinine Clearance: 101.9 mL/min (by C-G formula based on SCr of 0.83 mg/dL).   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 No lung infarction.   Significant events: 12/13 outpatient visit for SOB and DOE. Took prednisone and bronchodilators as prescribed without significant improvement. CXR was negative  AC/Heme: 12/18 saddle pulmonary embolism with RHS. No anticoagulation PTA. Previous CBC earlier this month WNL. New labs IP.   Goal of Therapy:  Heparin level 0.3-0.7 units/ml Monitor platelets by anticoagulation protocol: Yes   Plan:  Heparin 5000 unit bolus IV heparin 1550 units/hr Check heparin level in 8 hrs. Daily HL and CBC   Yarielis Funaro S. Merilynn Finland,  PharmD, BCPS Clinical Staff Pharmacist [email or web address] Merilynn Finland, [place] 08/19/2023,5:42 PM

## 2023-08-19 NOTE — Assessment & Plan Note (Signed)
Allow permissive HTN  

## 2023-08-19 NOTE — Assessment & Plan Note (Signed)
Would need further workup Testing for antiphospholipid antibody would be helpful Hematology follow-up

## 2023-08-20 ENCOUNTER — Observation Stay (HOSPITAL_COMMUNITY): Payer: 59

## 2023-08-20 ENCOUNTER — Telehealth (HOSPITAL_COMMUNITY): Payer: Self-pay

## 2023-08-20 ENCOUNTER — Other Ambulatory Visit (HOSPITAL_COMMUNITY): Payer: Self-pay

## 2023-08-20 ENCOUNTER — Encounter: Payer: 59 | Admitting: Physical Medicine and Rehabilitation

## 2023-08-20 DIAGNOSIS — Z803 Family history of malignant neoplasm of breast: Secondary | ICD-10-CM | POA: Diagnosis not present

## 2023-08-20 DIAGNOSIS — I10 Essential (primary) hypertension: Secondary | ICD-10-CM | POA: Diagnosis not present

## 2023-08-20 DIAGNOSIS — Z91048 Other nonmedicinal substance allergy status: Secondary | ICD-10-CM | POA: Diagnosis not present

## 2023-08-20 DIAGNOSIS — Z823 Family history of stroke: Secondary | ICD-10-CM | POA: Diagnosis not present

## 2023-08-20 DIAGNOSIS — I82451 Acute embolism and thrombosis of right peroneal vein: Secondary | ICD-10-CM | POA: Diagnosis not present

## 2023-08-20 DIAGNOSIS — Z923 Personal history of irradiation: Secondary | ICD-10-CM | POA: Diagnosis not present

## 2023-08-20 DIAGNOSIS — I2699 Other pulmonary embolism without acute cor pulmonale: Secondary | ICD-10-CM | POA: Diagnosis present

## 2023-08-20 DIAGNOSIS — Z6841 Body Mass Index (BMI) 40.0 and over, adult: Secondary | ICD-10-CM | POA: Diagnosis not present

## 2023-08-20 DIAGNOSIS — Z91199 Patient's noncompliance with other medical treatment and regimen due to unspecified reason: Secondary | ICD-10-CM | POA: Diagnosis not present

## 2023-08-20 DIAGNOSIS — Z7901 Long term (current) use of anticoagulants: Secondary | ICD-10-CM | POA: Diagnosis not present

## 2023-08-20 DIAGNOSIS — Z79810 Long term (current) use of selective estrogen receptor modulators (SERMs): Secondary | ICD-10-CM | POA: Diagnosis not present

## 2023-08-20 DIAGNOSIS — R9431 Abnormal electrocardiogram [ECG] [EKG]: Secondary | ICD-10-CM | POA: Diagnosis not present

## 2023-08-20 DIAGNOSIS — Z853 Personal history of malignant neoplasm of breast: Secondary | ICD-10-CM | POA: Diagnosis not present

## 2023-08-20 DIAGNOSIS — Z79899 Other long term (current) drug therapy: Secondary | ICD-10-CM | POA: Diagnosis not present

## 2023-08-20 DIAGNOSIS — Z1152 Encounter for screening for COVID-19: Secondary | ICD-10-CM | POA: Diagnosis not present

## 2023-08-20 DIAGNOSIS — I2602 Saddle embolus of pulmonary artery with acute cor pulmonale: Secondary | ICD-10-CM | POA: Diagnosis not present

## 2023-08-20 DIAGNOSIS — Z86711 Personal history of pulmonary embolism: Secondary | ICD-10-CM | POA: Diagnosis not present

## 2023-08-20 DIAGNOSIS — G4733 Obstructive sleep apnea (adult) (pediatric): Secondary | ICD-10-CM | POA: Diagnosis not present

## 2023-08-20 DIAGNOSIS — I739 Peripheral vascular disease, unspecified: Secondary | ICD-10-CM | POA: Diagnosis not present

## 2023-08-20 DIAGNOSIS — I2692 Saddle embolus of pulmonary artery without acute cor pulmonale: Secondary | ICD-10-CM | POA: Diagnosis not present

## 2023-08-20 DIAGNOSIS — I2510 Atherosclerotic heart disease of native coronary artery without angina pectoris: Secondary | ICD-10-CM | POA: Diagnosis present

## 2023-08-20 DIAGNOSIS — M310 Hypersensitivity angiitis: Secondary | ICD-10-CM | POA: Diagnosis not present

## 2023-08-20 DIAGNOSIS — Z832 Family history of diseases of the blood and blood-forming organs and certain disorders involving the immune mechanism: Secondary | ICD-10-CM | POA: Diagnosis not present

## 2023-08-20 DIAGNOSIS — Z8249 Family history of ischemic heart disease and other diseases of the circulatory system: Secondary | ICD-10-CM | POA: Diagnosis not present

## 2023-08-20 DIAGNOSIS — Z800 Family history of malignant neoplasm of digestive organs: Secondary | ICD-10-CM | POA: Diagnosis not present

## 2023-08-20 DIAGNOSIS — R768 Other specified abnormal immunological findings in serum: Secondary | ICD-10-CM | POA: Diagnosis not present

## 2023-08-20 DIAGNOSIS — Z801 Family history of malignant neoplasm of trachea, bronchus and lung: Secondary | ICD-10-CM | POA: Diagnosis not present

## 2023-08-20 LAB — CBC WITH DIFFERENTIAL/PLATELET
Abs Immature Granulocytes: 0.02 10*3/uL (ref 0.00–0.07)
Basophils Absolute: 0 10*3/uL (ref 0.0–0.1)
Basophils Relative: 0 %
Eosinophils Absolute: 0.1 10*3/uL (ref 0.0–0.5)
Eosinophils Relative: 1 %
HCT: 41.9 % (ref 36.0–46.0)
Hemoglobin: 13.1 g/dL (ref 12.0–15.0)
Immature Granulocytes: 0 %
Lymphocytes Relative: 30 %
Lymphs Abs: 2.5 10*3/uL (ref 0.7–4.0)
MCH: 26.3 pg (ref 26.0–34.0)
MCHC: 31.3 g/dL (ref 30.0–36.0)
MCV: 84.1 fL (ref 80.0–100.0)
Monocytes Absolute: 0.6 10*3/uL (ref 0.1–1.0)
Monocytes Relative: 7 %
Neutro Abs: 5.2 10*3/uL (ref 1.7–7.7)
Neutrophils Relative %: 62 %
Platelets: 185 10*3/uL (ref 150–400)
RBC: 4.98 MIL/uL (ref 3.87–5.11)
RDW: 14.3 % (ref 11.5–15.5)
WBC: 8.4 10*3/uL (ref 4.0–10.5)
nRBC: 0 % (ref 0.0–0.2)

## 2023-08-20 LAB — COMPREHENSIVE METABOLIC PANEL
ALT: 19 U/L (ref 0–44)
AST: 17 U/L (ref 15–41)
Albumin: 3.3 g/dL — ABNORMAL LOW (ref 3.5–5.0)
Alkaline Phosphatase: 47 U/L (ref 38–126)
Anion gap: 10 (ref 5–15)
BUN: 9 mg/dL (ref 6–20)
CO2: 21 mmol/L — ABNORMAL LOW (ref 22–32)
Calcium: 8.4 mg/dL — ABNORMAL LOW (ref 8.9–10.3)
Chloride: 106 mmol/L (ref 98–111)
Creatinine, Ser: 0.74 mg/dL (ref 0.44–1.00)
GFR, Estimated: >60 mL/min (ref >60)
Glucose, Bld: 94 mg/dL (ref 70–99)
Potassium: 3.9 mmol/L (ref 3.5–5.1)
Sodium: 137 mmol/L (ref 135–145)
Total Bilirubin: 0.7 mg/dL (ref <1.2)
Total Protein: 6.4 g/dL — ABNORMAL LOW (ref 6.5–8.1)

## 2023-08-20 LAB — PHOSPHORUS: Phosphorus: 2.7 mg/dL (ref 2.5–4.6)

## 2023-08-20 LAB — URINALYSIS, COMPLETE (UACMP) WITH MICROSCOPIC
Bilirubin Urine: NEGATIVE
Glucose, UA: NEGATIVE mg/dL
Hgb urine dipstick: NEGATIVE
Ketones, ur: NEGATIVE mg/dL
Leukocytes,Ua: NEGATIVE
Nitrite: NEGATIVE
Protein, ur: NEGATIVE mg/dL
Specific Gravity, Urine: 1.008 (ref 1.005–1.030)
pH: 5 (ref 5.0–8.0)

## 2023-08-20 LAB — ECHOCARDIOGRAM COMPLETE
Area-P 1/2: 5.27 cm2
Calc EF: 42 %
Height: 66 in
S' Lateral: 2.4 cm
Single Plane A2C EF: 31.5 %
Single Plane A4C EF: 57 %
Weight: 4081.16 [oz_av]

## 2023-08-20 LAB — ANTITHROMBIN III: AntiThromb III Func: 71 % — ABNORMAL LOW (ref 75–120)

## 2023-08-20 LAB — MRSA NEXT GEN BY PCR, NASAL: MRSA by PCR Next Gen: NOT DETECTED

## 2023-08-20 LAB — TSH: TSH: 4.384 u[IU]/mL (ref 0.350–4.500)

## 2023-08-20 LAB — HEPARIN LEVEL (UNFRACTIONATED)
Heparin Unfractionated: 0.46 [IU]/mL (ref 0.30–0.70)
Heparin Unfractionated: 0.58 [IU]/mL (ref 0.30–0.70)

## 2023-08-20 LAB — HIV ANTIBODY (ROUTINE TESTING W REFLEX): HIV Screen 4th Generation wRfx: NONREACTIVE

## 2023-08-20 LAB — MAGNESIUM: Magnesium: 2 mg/dL (ref 1.7–2.4)

## 2023-08-20 MED ORDER — ORAL CARE MOUTH RINSE: 15.0000 mL | OROMUCOSAL | Status: DC | PRN

## 2023-08-20 MED ORDER — CHLORHEXIDINE GLUCONATE CLOTH 2 % EX PADS
6.0000 | MEDICATED_PAD | Freq: Every day | CUTANEOUS | Status: DC
  Administered 2023-08-20 – 2023-08-22 (×2): 6 via TOPICAL

## 2023-08-20 MED ORDER — GUAIFENESIN 100 MG/5ML PO LIQD
5.0000 mL | ORAL | Status: DC | PRN
  Administered 2023-08-20 – 2023-08-21 (×4): 5 mL via ORAL
  Filled 2023-08-20 (×4): qty 10

## 2023-08-20 MED ORDER — AMLODIPINE BESYLATE 5 MG PO TABS
5.0000 mg | ORAL_TABLET | Freq: Every day | ORAL | Status: DC
  Administered 2023-08-20 – 2023-08-22 (×3): 5 mg via ORAL
  Filled 2023-08-20 (×3): qty 1

## 2023-08-20 MED ORDER — HYDRALAZINE HCL 25 MG PO TABS
25.0000 mg | ORAL_TABLET | Freq: Four times a day (QID) | ORAL | Status: DC | PRN
  Administered 2023-08-20 – 2023-08-21 (×2): 25 mg via ORAL
  Filled 2023-08-20 (×2): qty 1

## 2023-08-20 MED ORDER — PERFLUTREN LIPID MICROSPHERE
1.0000 mL | INTRAVENOUS | Status: AC | PRN
  Administered 2023-08-20: 2 mL via INTRAVENOUS

## 2023-08-20 NOTE — Progress Notes (Signed)
[facility]  PROGRESS NOTE  Erin Gallagher MWN:[id number] DOB: 10/23/1970 DOA: 08/19/2023 PCP: Georgina Quint, MD   Brief HPI:   52 y.o. female with medical history significant of   Obesity, ANA positive, essential hypertension, breast cancer status postradiation and lumpectomy on tamoxifen, history of leukocytoclastic vasculitis mild CAD     Presented with shortness of breath and right leg swelling Since thanksgiving has had right leg swelling but left leg felt numb Mild CP , and SOB went to PCP who got CTA that showed saddle PE  No travel no prior hx of PE  Does not smoke, not on blood thinner  Reports left foot has been feeling numb and hurts more with exertion Decreased pulses on the left One family member had blood clots  Personal hx of breat cancer in 2020 sp lumpectomy, radiation    Assessment/Plan:   Pulmonary embolism with right heart strain -CT scan showed saddle embolus with right heart strain on CT scan -Pulmonology was consulted, did not recommend any aggressive intervention with thrombolysis or thrombectomy -Patient is hemodynamically stable -Plan to continue with heparin for 48 hours and then switch to Eliquis -Patient was taking tamoxifen at home due to history of breast cancer -Called and discussed with oncologist Dr. Georgiann Mohs, he recommends to stop tamoxifen. -Follow echocardiogram  History of leukocytoclastic vasculitis -Patient with history of ANA positive -Hypercoagulable workup in process -Discussed with hematology, okay to start Eliquis and follow-up with hematology as outpatient.   Left leg claudication -Patient found to have DVT in the right peroneal vein -Will discontinue ABIs at this time -Can be done at a later date.  History of breast cancer -Status postlumpectomy and radiation in 2020 -She has been taking tamoxifen as outpatient -Called and discussed with Dr. Pamelia Hoit.  He recommends to discontinue tamoxifen at this  time    Medications     amLODipine  5 mg Oral Daily   Chlorhexidine Gluconate Cloth  6 each Topical Daily     Data Reviewed:   CBG:  No results for input(s): "GLUCAP" in the last 168 hours.  SpO2: 94 %    Vitals:   08/20/23 1331 08/20/23 1335 08/20/23 1400 08/20/23 1523  BP: (!) 171/95 (!) 171/95 (!) 153/89 (!) 149/86  Pulse: 92  95 94  Resp: 19  (!) 23 (!) 21  Temp:      TempSrc:      SpO2: 93%  91% 94%  Weight:      Height:          Data Reviewed:  Basic Metabolic Panel: Recent Labs  Lab 08/19/23 1720 08/19/23 1948 08/20/23 0342  NA 136  --  137  K 3.8  --  3.9  CL 106  --  106  CO2 21*  --  21*  GLUCOSE 98  --  94  BUN 11  --  9  CREATININE 0.80  --  0.74  CALCIUM 8.9  --  8.4*  MG  --  2.0 2.0  PHOS  --  3.0 2.7    CBC: Recent Labs  Lab 08/19/23 1720 08/20/23 0355  WBC 10.7* 8.4  NEUTROABS  --  5.2  HGB 15.0 13.1  HCT 48.1* 41.9  MCV 84.7 84.1  PLT 209 185    LFT Recent Labs  Lab 08/19/23 1948 08/20/23 0342  AST 18 17  ALT 20 19  ALKPHOS 52 47  BILITOT 0.6 0.7  PROT 7.1 6.4*  ALBUMIN 3.5 3.3*  Antibiotics: Anti-infectives (From admission, onward)    None        DVT prophylaxis: Heparin  Code Status: Full code  Family Communication: No family at bedside   CONSULTS none   Subjective   Denies shortness of breath.  No chest pain.   Objective    Physical Examination:   General-appears in no acute distress Heart-S1-S2, regular, no murmur auscultated Lungs-clear to auscultation bilaterally, no wheezing or crackles auscultated Abdomen-soft, nontender, no organomegaly Extremities-no edema in the lower extremities Neuro-alert, oriented x3, no focal deficit noted  Status is: Inpatient:         Meredeth Ide   [facility] If 7PM-7AM, please contact night-coverage at [email or web address], Office  [phone]   08/20/2023, 3:30 PM  LOS: 0 days

## 2023-08-20 NOTE — Progress Notes (Signed)
NAME:  Erin Gallagher, MRN:  [id number], DOB:  November 10, 1970, LOS: 0 ADMISSION DATE:  08/19/2023, CONSULTATION DATE:  08/19/23 REFERRING MD:  Georgina Quint, MD CHIEF COMPLAINT:  Pulmonary embolism   History of Present Illness:  52 year old female with prior hx left breast cancer on tamoxifen who presents with 3 week history of shortness of breath and lower extremity swelling. This began after Thanksgiving when she began having left foot pain and became less mobile. Still able to ambulate within the house but not as frequently. Noticed the right lower leg became more swollen in this time. Shortness of breath with activity and associated with chest pain in the last week. Denies dizziness or syncope. Denies recent prolonged travel. Remote history of breast cancer 2020 s/p lumpectomy/ radiation with no recurrence but remains on tamoxifen. Denies personal or family history of clot. In the ED hemodynamically stable with HR <100, SBP 150-160, SpO2 98% on room air. CBC and CMET unremarkable. Troponin 15. CTA with bilateral saddle pulmonary embolism with right heart strain.  Pertinent  Medical History  HTN, morbid obesity, hx left breast cancer IA in 2020 s/p lumpectomy and adjuvant radiation and tamoxifen Never smoker  Significant Hospital Events: Including procedures, antibiotic start and stop dates in addition to other pertinent events   12/18 admit saddle PE, hemodynamically stable  Interim History / Subjective:  Better SOB, intermittent mild CP at times.  Remains hemodynamically stable on room air +RLE DVT  Objective   Blood pressure (!) 155/72, pulse 84, temperature 97.8 F (36.6 C), temperature source Oral, resp. rate 18, height 5\' 6"  (1.676 m), weight 115.7 kg, last menstrual period 12/30/2021, SpO2 96%.        Intake/Output Summary (Last 24 hours) at 08/20/2023 0832 Last data filed at 08/20/2023 0700 Gross per 24 hour  Intake 841.54 ml  Output --  Net 841.54 ml   Filed Weights    08/19/23 1653 08/19/23 1818 08/20/23 0212  Weight: 118 kg 115.2 kg 115.7 kg   Physical Exam: General:  Pleasant adult female sitting upright in bed in NAD HEENT: MM pink/moist Neuro: Aox4 CV: rr, no murmur PULM:  non labored, RA, clear anteriorly diminished in bases GI: soft, obese Extremities: warm/dry, RLL edema +1, trace LLL edema Skin: no rashes   Resolved Hospital Problem list   N/A  Assessment & Plan:   Bilateral Saddle Pulmonary Emboli RLE DVT - CTA 08/19/23 Bilateral saddle pulmonary embolism - PESI 82 points  Class II, Low Risk: 1.7-3.5% 30-day mortality in this group. - thrombectomy discussed 12/18 given clot burden, given stability/ neg markers, pt elected to remain on heparin  P:  - remains hemodynamically stable and on RA - heparin per pharmacy, likely transition to DOAC after 48 hrs, with recs for 3-6 months given likely provoked PE in setting of immobility on tamoxifen use.  She will need to f/u with oncology to discuss continuation of tamoxifen - bedrest 24hrs  - echo pending - BNP 165, trop trend neg 15-21-21 - if decompensation, no contraindications to system lytics (discussed 12/18 w/ pt)   Nothing further to add.  PCCM will sign off.  Please call us back if we can be of any further assistance.   Best Practice (right click and "Reselect all SmartList Selections" daily)   Per Primary  Labs   CBC: Recent Labs  Lab 08/19/23 1720 08/20/23 0355  WBC 10.7* 8.4  NEUTROABS  --  5.2  HGB 15.0 13.1  HCT 48.1* 41.9  MCV 84.7 84.1  PLT 209 185    Basic Metabolic Panel: Recent Labs  Lab 08/19/23 1720 08/19/23 1948 08/20/23 0342  NA 136  --  137  K 3.8  --  3.9  CL 106  --  106  CO2 21*  --  21*  GLUCOSE 98  --  94  BUN 11  --  9  CREATININE 0.80  --  0.74  CALCIUM 8.9  --  8.4*  MG  --  2.0 2.0  PHOS  --  3.0 2.7   GFR: Estimated Creatinine Clearance: 106.4 mL/min (by C-G formula based on SCr of 0.74 mg/dL). Recent Labs  Lab  08/19/23 1720 08/20/23 0355  WBC 10.7* 8.4    Liver Function Tests: Recent Labs  Lab 08/19/23 1948 08/20/23 0342  AST 18 17  ALT 20 19  ALKPHOS 52 47  BILITOT 0.6 0.7  PROT 7.1 6.4*  ALBUMIN 3.5 3.3*   No results for input(s): "LIPASE", "AMYLASE" in the last 168 hours. No results for input(s): "AMMONIA" in the last 168 hours.  ABG    Component Value Date/Time   O2SAT 42.7 07/19/2023 0305     Coagulation Profile: Recent Labs  Lab 08/19/23 1720  INR 1.1    Cardiac Enzymes: No results for input(s): "CKTOTAL", "CKMB", "CKMBINDEX", "TROPONINI" in the last 168 hours.  HbA1C: Hgb A1c MFr Bld  Date/Time Value Ref Range Status  10/30/2021 02:31 PM 6.3 4.6 - 6.5 % Final    Comment:    Glycemic Control Guidelines for People with Diabetes:Non Diabetic:  <6%Goal of Therapy: <7%Additional Action Suggested:  >8%   06/01/2020 10:12 AM 6.1 (H) 4.8 - 5.6 % Final    Comment:             Prediabetes: 5.7 - 6.4          Diabetes: >6.4          Glycemic control for adults with diabetes: <7.0     CBG: No results for input(s): "GLUCAP" in the last 168 hours. Allergies Allergies  Allergen Reactions   Other Itching, Rash and Other (See Comments)    Steri Strips cause itching and a rash     Home Medications  Prior to Admission medications   Medication Sig Start Date End Date Taking? Authorizing Provider  oxyCODONE-acetaminophen (PERCOCET) 10-325 MG tablet Take 1 tablet by mouth every 8 (eight) hours as needed for pain.   Yes [provider]  TYLENOL 500 MG tablet Take 500-1,000 mg by mouth every 6 (six) hours as needed for mild pain (pain score 1-3) or headache.   Yes [provider]  albuterol (VENTOLIN HFA) 108 (90 Base) MCG/ACT inhaler Inhale 2 puffs into the lungs 2 (two) times daily for 5 days. Patient taking differently: Inhale 2 puffs into the lungs 2 (two) times daily as needed for wheezing or shortness of breath. 08/16/23 09/06/23 Yes Sagardia, Eilleen Kempf, MD  amLODipine (NORVASC) 5 MG tablet Take 1 tablet (5 mg total) by mouth daily. 03/11/23  Yes Sagardia, Eilleen Kempf, MD  cyclobenzaprine (FLEXERIL) 10 MG tablet Take 1 tablet (10 mg total) by mouth at bedtime. Patient taking differently: Take 10 mg by mouth at bedtime as needed for muscle spasms. 07/01/23  Yes Georgina Quint, MD  diclofenac Sodium (VOLTAREN) 1 % GEL APPLY 2 GRAMS TO THE AFFECTED AREA(S) BY TOPICAL ROUTE 2-3 TIMES PER DAY Patient not taking: Reported on 08/19/2023 06/29/23     losartan-hydrochlorothiazide (HYZAAR) 100-12.5 MG tablet  Take 1 tablet by mouth daily. 03/11/23  Yes Sagardia, Eilleen Kempf, MD  nitroGLYCERIN (NITROSTAT) 0.4 MG SL tablet Place 1 tablet (0.4 mg total) under the tongue every 5 (five) minutes as needed for chest pain. 02/02/23  Yes Marykay Lex, MD  tamoxifen (NOLVADEX) 20 MG tablet Take 1 tablet (20 mg total) by mouth daily. 07/20/23  Yes Serena Croissant, MD  VITAMIN D PO Take 1,000 Units by mouth daily.   Yes [provider]     Critical care time: N/A       Posey Boyer, MSN, AG-ACNP-BC [facility] 08/20/2023, 8:32 AM  See Amion for pager If no response to pager , please call [phone] until 7pm After 7:00 pm call Elink  [phone]

## 2023-08-20 NOTE — Progress Notes (Signed)
   08/20/23 0900  TOC Brief Assessment  Insurance and Status Reviewed  Patient has primary care physician Yes  Home environment has been reviewed Home w/ spouse  Prior level of function: Independent  Prior/Current Home Services No current home services  Social Drivers of Health Review SDOH reviewed no interventions necessary  Readmission risk has been reviewed Yes  Transition of care needs no transition of care needs at this time    [facility] consulted for medication assistance. 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 Per H&P, MD noted that pt "Would likely benefit from case manager consult for long term anticoagulation." TOC unable to determine long-term anti-coagulation and recommend consult to pharmacy be placed.

## 2023-08-20 NOTE — Plan of Care (Signed)
°  Problem: Clinical Measurements: Goal: Will remain free from infection Outcome: Progressing Goal: Respiratory complications will improve Outcome: Progressing Goal: Cardiovascular complication will be avoided Outcome: Progressing   Problem: Coping: Goal: Level of anxiety will decrease Outcome: Progressing   Problem: Safety: Goal: Ability to remain free from injury will improve Outcome: Progressing

## 2023-08-20 NOTE — Progress Notes (Signed)
Bilateral lower extremity venous duplex has been completed. Preliminary results can be found in CV Proc through chart review.  Results were given to Dr. Sharl Ma.  08/20/23 8:36 AM Olen Cordial RVT

## 2023-08-20 NOTE — Telephone Encounter (Signed)
Pharmacy Patient Advocate Encounter  Insurance verification completed.    The patient is insured through [facility].     Ran test claim for Eliquis and the current 30 day co-pay is $110.24.  Ran test claim for Xarelto and the current 30 day co-pay is $110.50.  This test claim was processed through [facility]- copay amounts may vary at other pharmacies due to pharmacy/plan contracts, or as the patient moves through the different stages of their insurance plan.

## 2023-08-20 NOTE — ED Provider Notes (Signed)
[facility]-ICU/STEPDOWN Provider Note   CSN: [id number] Arrival date & time: 08/19/23  1629     History  Chief Complaint  Patient presents with   seny by md    Erin Gallagher is a 52 y.o. female.  Presented with outpatient CT scan with saddle PE with right heart strain. Symptoms since a few days before thanksgiving. Progressive DOE. None while at rest. No history of clots, prior cancer, immobilization, travel, hormonal therapy. Denies hemoptysis. No trauma.         Home Medications Prior to Admission medications   Medication Sig Start Date End Date Taking? Authorizing Provider  albuterol (VENTOLIN HFA) 108 (90 Base) MCG/ACT inhaler Inhale 2 puffs into the lungs 2 (two) times daily for 5 days. Patient taking differently: Inhale 2 puffs into the lungs 2 (two) times daily as needed for wheezing or shortness of breath. 08/16/23 09/06/23 Yes Sagardia, Eilleen Kempf, MD  amLODipine (NORVASC) 5 MG tablet Take 1 tablet (5 mg total) by mouth daily. 03/11/23  Yes Sagardia, Eilleen Kempf, MD  cyclobenzaprine (FLEXERIL) 10 MG tablet Take 1 tablet (10 mg total) by mouth at bedtime. Patient taking differently: Take 10 mg by mouth at bedtime as needed for muscle spasms. 07/01/23  Yes Sagardia, Eilleen Kempf, MD  losartan-hydrochlorothiazide (HYZAAR) 100-12.5 MG tablet Take 1 tablet by mouth daily. 03/11/23  Yes Sagardia, Eilleen Kempf, MD  nitroGLYCERIN (NITROSTAT) 0.4 MG SL tablet Place 1 tablet (0.4 mg total) under the tongue every 5 (five) minutes as needed for chest pain. 02/02/23  Yes Marykay Lex, MD  oxyCODONE-acetaminophen (PERCOCET) 10-325 MG tablet Take 1 tablet by mouth every 8 (eight) hours as needed for pain.   Yes [provider]  tamoxifen (NOLVADEX) 20 MG tablet Take 1 tablet (20 mg total) by mouth daily. 07/20/23  Yes Serena Croissant, MD  TYLENOL 500 MG tablet Take 500-1,000 mg by mouth every 6 (six) hours as needed for mild pain (pain score 1-3) or headache.    Yes [provider]  VITAMIN D PO Take 1,000 Units by mouth daily.   Yes [provider]  diclofenac Sodium (VOLTAREN) 1 % GEL APPLY 2 GRAMS TO THE AFFECTED AREA(S) BY TOPICAL ROUTE 2-3 TIMES PER DAY Patient not taking: Reported on 08/19/2023 06/29/23         Allergies    Other    Review of Systems   Review of Systems  Physical Exam Updated Vital Signs BP (!) 153/89   Pulse 95   Temp 98.1 F (36.7 C) (Oral)   Resp (!) 23   Ht 5\' 6"  (1.676 m)   Wt 115.7 kg   LMP 12/30/2021   SpO2 91%   BMI 41.17 kg/m  Physical Exam Vitals and nursing note reviewed.  Constitutional:      General: She is not in acute distress.    Appearance: She is obese. She is not toxic-appearing.  HENT:     Head: Normocephalic and atraumatic.     Nose: Nose normal.     Mouth/Throat:     Mouth: Mucous membranes are moist.  Eyes:     Conjunctiva/sclera: Conjunctivae normal.  Cardiovascular:     Rate and Rhythm: Tachycardia present.  Pulmonary:     Effort: Pulmonary effort is normal.     Breath sounds: Normal breath sounds.  Abdominal:     General: Abdomen is flat. There is no distension.     Tenderness: There is no abdominal tenderness. There is no guarding or  rebound.  Musculoskeletal:     Right lower leg: Edema present.     Left lower leg: Edema present.  Skin:    General: Skin is warm.     Capillary Refill: Capillary refill takes less than 2 seconds.  Psychiatric:        Mood and Affect: Mood normal.        Behavior: Behavior normal.     ED Results / Procedures / Treatments   Labs (all labs ordered are listed, but only abnormal results are displayed) Labs Reviewed  BASIC METABOLIC PANEL - Abnormal; Notable for the following components:      Result Value   CO2 21 (*)    All other components within normal limits  CBC - Abnormal; Notable for the following components:   WBC 10.7 (*)    RBC 5.68 (*)    HCT 48.1 (*)    All other components within normal limits   BRAIN NATRIURETIC PEPTIDE - Abnormal; Notable for the following components:   B Natriuretic Peptide 165.2 (*)    All other components within normal limits  URINALYSIS, COMPLETE (UACMP) WITH MICROSCOPIC - Abnormal; Notable for the following components:   Bacteria, UA FEW (*)    All other components within normal limits  ANTITHROMBIN III - Abnormal; Notable for the following components:   AntiThromb III Func 71 (*)    All other components within normal limits  COMPREHENSIVE METABOLIC PANEL - Abnormal; Notable for the following components:   CO2 21 (*)    Calcium 8.4 (*)    Total Protein 6.4 (*)    Albumin 3.3 (*)    All other components within normal limits  TROPONIN I (HIGH SENSITIVITY) - Abnormal; Notable for the following components:   Troponin I (High Sensitivity) 21 (*)    All other components within normal limits  TROPONIN I (HIGH SENSITIVITY) - Abnormal; Notable for the following components:   Troponin I (High Sensitivity) 21 (*)    All other components within normal limits  SARS CORONAVIRUS 2 BY RT PCR  MRSA NEXT GEN BY PCR, NASAL  PROTIME-INR  APTT  HEPARIN LEVEL (UNFRACTIONATED)  MAGNESIUM  PHOSPHORUS  HEPATIC FUNCTION PANEL  TSH  CBC WITH DIFFERENTIAL/PLATELET  HIV ANTIBODY (ROUTINE TESTING W REFLEX)  MAGNESIUM  PHOSPHORUS  HEPARIN LEVEL (UNFRACTIONATED)  CBC WITH DIFFERENTIAL/PLATELET  ANA W/REFLEX IF POSITIVE  LUPUS ANTICOAGULANT PANEL  BETA-2-GLYCOPROTEIN I ABS, IGG/M/A  HOMOCYSTEINE  FACTOR 5 LEIDEN  PROTHROMBIN GENE MUTATION  CARDIOLIPIN ANTIBODIES, IGG, IGM, IGA  PROTEIN S ACTIVITY  PROTEIN S, TOTAL  TROPONIN I (HIGH SENSITIVITY)    EKG EKG Interpretation Date/Time:  Wednesday August 19 2023 17:30:40 EST Ventricular Rate:  98 PR Interval:  139 QRS Duration:  93 QT Interval:  417 QTC Calculation: 533 R Axis:   -57  Text Interpretation: Sinus rhythm Inferior infarct, age indeterminate Probable anterior infarct, age indeterminate Prolonged QT  interval Confirmed by Virgina Norfolk [phone]) on 08/20/2023 10:28:57 AM  Radiology ECHOCARDIOGRAM COMPLETE Result Date: 08/20/2023    ECHOCARDIOGRAM REPORT   Patient Name:   Erin Gallagher [facility] Date of Exam: 08/20/2023 Medical Rec #:  [id number]       Height:       66.0 in Accession #:    [id number]      Weight:       255.1 lb Date of Birth:  1970-11-04        BSA:          2.218 m Patient Age:  52 years        BP:           155/72 mmHg Patient Gender: F               HR:           88 bpm. Exam Location:  Inpatient Procedure: 2D Echo, Color Doppler, Cardiac Doppler and Intracardiac            Opacification Agent Indications:    I26.02 Pulmonary embolus  History:        Patient has prior history of Echocardiogram examinations, most                 recent 03/09/2023. Signs/Symptoms:Dyspnea and Shortness of Breath;                 Risk Factors:Hypertension. Breast cancer. Pulmonary embolus. Cor                 pulmonale.  Sonographer:    Sheralyn Boatman RDCS Referring Phys: [id number] ANASTASSIA DOUTOVA  Sonographer Comments: Technically difficult study due to poor echo windows and patient is obese. Image acquisition challenging due to patient body habitus. Machine shut down mid exam. IMPRESSIONS  1. Left ventricular ejection fraction, by estimation, is 55 to 60%. The left ventricle has normal function. The left ventricle has no regional wall motion abnormalities. There is severe concentric left ventricular hypertrophy. Indeterminate diastolic filling due to E-A fusion.  2. Right ventricular systolic function is moderately reduced. The right ventricular size is moderately enlarged. Mildly increased right ventricular wall thickness. There is severely elevated pulmonary artery systolic pressure. The estimated right ventricular systolic pressure is 62.8 mmHg.  3. Right atrial size was mildly dilated.  4. The mitral valve is grossly normal. No evidence of mitral valve regurgitation. No evidence of mitral stenosis.  5. Tricuspid valve  regurgitation is moderate to severe.  6. The aortic valve is normal in structure. Aortic valve regurgitation is not visualized. No aortic stenosis is present.  7. Pulmonic valve regurgitation is moderate.  8. The inferior vena cava is normal in size with <50% respiratory variability, suggesting right atrial pressure of 8 mmHg. Conclusion(s)/Recommendation(s): The RV is dilated , + RV strain. FINDINGS  Left Ventricle: Left ventricular ejection fraction, by estimation, is 55 to 60%. The left ventricle has normal function. The left ventricle has no regional wall motion abnormalities. Definity contrast agent was given IV to delineate the left ventricular  endocardial borders. The left ventricular internal cavity size was normal in size. There is severe concentric left ventricular hypertrophy. Indeterminate diastolic filling due to E-A fusion. Right Ventricle: The right ventricular size is moderately enlarged. Mildly increased right ventricular wall thickness. Right ventricular systolic function is moderately reduced. There is severely elevated pulmonary artery systolic pressure. The tricuspid  regurgitant velocity is 3.70 m/s, and with an assumed right atrial pressure of 8 mmHg, the estimated right ventricular systolic pressure is 62.8 mmHg. Left Atrium: Left atrial size was normal in size. Right Atrium: Right atrial size was mildly dilated. Pericardium: There is no evidence of pericardial effusion. Mitral Valve: The mitral valve is grossly normal. No evidence of mitral valve regurgitation. No evidence of mitral valve stenosis. Tricuspid Valve: The tricuspid valve is normal in structure. Tricuspid valve regurgitation is moderate to severe. No evidence of tricuspid stenosis. Aortic Valve: The aortic valve is normal in structure. Aortic valve regurgitation is not visualized. No aortic stenosis is present. Pulmonic Valve: The pulmonic valve was grossly normal. Pulmonic  valve regurgitation is moderate. No evidence of  pulmonic stenosis. Aorta: The aortic root and ascending aorta are structurally normal, with no evidence of dilitation. Venous: The inferior vena cava is normal in size with less than 50% respiratory variability, suggesting right atrial pressure of 8 mmHg. IAS/Shunts: No atrial level shunt detected by color flow Doppler.  LEFT VENTRICLE PLAX 2D LVIDd:         3.40 cm     Diastology LVIDs:         2.40 cm     LV e' medial:    5.66 cm/s LV PW:         1.60 cm     LV E/e' medial:  9.2 LV IVS:        1.40 cm     LV e' lateral:   8.22 cm/s                            LV E/e' lateral: 6.3  LV Volumes (MOD) LV vol d, MOD A2C: 56.9 ml LV vol d, MOD A4C: 45.6 ml LV vol s, MOD A2C: 39.0 ml LV vol s, MOD A4C: 19.6 ml LV SV MOD A2C:     17.9 ml LV SV MOD A4C:     45.6 ml LV SV MOD BP:      22.0 ml RIGHT VENTRICLE            IVC RV S prime:     7.07 cm/s  IVC diam: 1.90 cm TAPSE (M-mode): 2.0 cm LEFT ATRIUM           Index        RIGHT ATRIUM           Index LA diam:      3.00 cm 1.35 cm/m   RA Area:     15.40 cm LA Vol (A2C): 15.0 ml 6.76 ml/m   RA Volume:   39.90 ml  17.99 ml/m LA Vol (A4C): 22.4 ml 10.10 ml/m  AORTIC VALVE             PULMONIC VALVE LVOT Vmax:   85.30 cm/s  PR End Diast Vel: 2.09 msec LVOT Vmean:  54.200 cm/s LVOT VTI:    0.123 m  AORTA Ao Root diam: 2.80 cm Ao Asc diam:  3.40 cm MITRAL VALVE               TRICUSPID VALVE MV Area (PHT): 5.27 cm    TR Peak grad:   54.8 mmHg MV Decel Time: 144 msec    TR Vmax:        370.00 cm/s MV E velocity: 52.00 cm/s MV A velocity: 92.40 cm/s  SHUNTS MV E/A ratio:  0.56        Systemic VTI: 0.12 m Carolan Clines Electronically signed by Carolan Clines Signature Date/Time: 08/20/2023/12:17:51 PM    Final    VAS [place] LOWER EXTREMITY VENOUS (DVT) Result Date: 08/20/2023  Lower Venous DVT Study Patient Name:  Erin Gallagher [facility]  Date of Exam:   08/20/2023 Medical Rec #: [id number]        Accession #:    [id number] Date of Birth: 01/19/1971         Patient Gender: F Patient Age:   53  years Exam Location:  [facility] Procedure:      VAS [place] LOWER EXTREMITY VENOUS (DVT) Referring Phys: Jonny Ruiz DOUTOVA --------------------------------------------------------------------------------  Indications: Pulmonary embolism.  Risk Factors: Confirmed PE.  Anticoagulation: Heparin. Limitations: Poor ultrasound/tissue interface. Comparison Study: No prior studies. Performing Technologist: Chanda Busing RVT  Examination Guidelines: A complete evaluation includes B-mode imaging, spectral Doppler, color Doppler, and power Doppler as needed of all accessible portions of each vessel. Bilateral testing is considered an integral part of a complete examination. Limited examinations for reoccurring indications may be performed as noted. The reflux portion of the exam is performed with the patient in reverse Trendelenburg.  +---------+---------------+---------+-----------+----------+--------------+ RIGHT    CompressibilityPhasicitySpontaneityPropertiesThrombus Aging +---------+---------------+---------+-----------+----------+--------------+ CFV      Full           Yes      Yes                                 +---------+---------------+---------+-----------+----------+--------------+ SFJ      Full                                                        +---------+---------------+---------+-----------+----------+--------------+ FV Prox  Full                                                        +---------+---------------+---------+-----------+----------+--------------+ FV Mid   Full                                                        +---------+---------------+---------+-----------+----------+--------------+ FV DistalFull                                                        +---------+---------------+---------+-----------+----------+--------------+ PFV      Full                                                         +---------+---------------+---------+-----------+----------+--------------+ POP      Full           Yes      Yes                                 +---------+---------------+---------+-----------+----------+--------------+ PTV      Full                                                        +---------+---------------+---------+-----------+----------+--------------+ PERO     None  Acute          +---------+---------------+---------+-----------+----------+--------------+   +---------+---------------+---------+-----------+----------+--------------+ LEFT     CompressibilityPhasicitySpontaneityPropertiesThrombus Aging +---------+---------------+---------+-----------+----------+--------------+ CFV      Full           Yes      Yes                                 +---------+---------------+---------+-----------+----------+--------------+ SFJ      Full                                                        +---------+---------------+---------+-----------+----------+--------------+ FV Prox  Full                                                        +---------+---------------+---------+-----------+----------+--------------+ FV Mid   Full                                                        +---------+---------------+---------+-----------+----------+--------------+ FV DistalFull                                                        +---------+---------------+---------+-----------+----------+--------------+ PFV      Full                                                        +---------+---------------+---------+-----------+----------+--------------+ POP      Full           Yes      Yes                                 +---------+---------------+---------+-----------+----------+--------------+ PTV      Full                                                         +---------+---------------+---------+-----------+----------+--------------+ PERO     Full                                                        +---------+---------------+---------+-----------+----------+--------------+     Summary: RIGHT: - Findings consistent with acute deep vein thrombosis involving the right peroneal veins.  - No cystic structure found in the popliteal fossa.  LEFT: - There  is no evidence of deep vein thrombosis in the lower extremity.  - No cystic structure found in the popliteal fossa.  *See table(s) above for measurements and observations. Electronically signed by Carolynn Sayers on 08/20/2023 at 12:07:17 PM.    Final    DG Chest Portable 1 View Result Date: 08/19/2023 CLINICAL DATA:  Shortness of breath EXAM: PORTABLE CHEST 1 VIEW COMPARISON:  08/04/2023 FINDINGS: The heart size and mediastinal contours are within normal limits. Both lungs are clear. The visualized skeletal structures are unremarkable. IMPRESSION: No active disease. Electronically Signed   By: Jasmine Pang M.D.   On: 08/19/2023 19:13   CT Angio Chest W/Cm &/Or Wo Cm Addendum Date: 08/19/2023 ADDENDUM REPORT: 08/19/2023 15:45 ADDENDUM: Critical Value/emergent results were called by telephone at the time of interpretation on 08/19/2023 at 3:44 pm to CMA Melida Quitter. She will convey the message to provider MIGUEL SAGARDIA. The report is already available for review. Electronically Signed   By: Jules Schick M.D.   On: 08/19/2023 15:45   Result Date: 08/19/2023 CLINICAL DATA:  Pulmonary embolism (PE) suspected, high prob. EXAM: CT ANGIOGRAPHY CHEST WITH CONTRAST TECHNIQUE: Multidetector CT imaging of the chest was performed using the standard protocol during bolus administration of intravenous contrast. Multiplanar CT image reconstructions and MIPs were obtained to evaluate the vascular anatomy. RADIATION DOSE REDUCTION: This exam was performed according to the departmental dose-optimization program which  includes automated exposure control, adjustment of the mA and/or kV according to patient size and/or use of iterative reconstruction technique. CONTRAST:  75mL ISOVUE-370 IOPAMIDOL (ISOVUE-370) INJECTION 76% COMPARISON:  None Available. FINDINGS: Cardiovascular: Examination is limited due to suboptimal contrast enhancement. However, having said that, there is moderate-to-large saddle pulmonary embolism extending from the main pulmonary trunk into the bilateral lobar, segmental and subsegmental pulmonary artery branches. There is right heart strain. No lung infarction. Normal cardiac size. No pericardial effusion. No aortic aneurysm. Mediastinum/Nodes: Visualized thyroid gland appears grossly unremarkable. No solid / cystic mediastinal masses. The esophagus is nondistended precluding optimal assessment. No axillary, mediastinal or hilar lymphadenopathy by size criteria. Lungs/Pleura: The central tracheo-bronchial tree is patent. There are patchy areas of linear, plate-like atelectasis and/or scarring throughout bilateral lungs. No mass or consolidation. No pleural effusion or pneumothorax. No suspicious lung nodules. Upper Abdomen: There is mild diffuse hepatic steatosis. Surgically absent gallbladder. Remaining visualized upper abdominal viscera within normal limits. Musculoskeletal: The visualized soft tissues of the chest wall are grossly unremarkable. No suspicious osseous lesions. There are mild multilevel degenerative changes in the visualized spine. Review of the MIP images confirms the above findings. IMPRESSION: *There is moderate-to-large volume saddle pulmonary embolism with right heart strain. No lung infarction. *Multiple other nonacute observations, as described above. Electronically Signed: By: Jules Schick M.D. On: 08/19/2023 15:35    Procedures Procedures    Medications Ordered in ED Medications  heparin ADULT infusion 100 units/mL (25000 units/252mL) (1,550 Units/hr Intravenous Infusion  Verify 08/20/23 1150)  acetaminophen (TYLENOL) tablet 650 mg (has no administration in time range)    Or  acetaminophen (TYLENOL) suppository 650 mg (has no administration in time range)  fentaNYL (SUBLIMAZE) injection 12.5-50 mcg (50 mcg Intravenous Given 08/20/23 8657)  oxyCODONE-acetaminophen (PERCOCET/ROXICET) 5-325 MG per tablet 1 tablet (1 tablet Oral Given 08/20/23 0004)    And  oxyCODONE (Oxy IR/ROXICODONE) immediate release tablet 5 mg (has no administration in time range)  Chlorhexidine Gluconate Cloth 2 % PADS 6 each (has no administration in time range)  Oral care mouth rinse (has no  administration in time range)  guaiFENesin (ROBITUSSIN) 100 MG/5ML liquid 5 mL (5 mLs Oral Given 08/20/23 1152)  amLODipine (NORVASC) tablet 5 mg (5 mg Oral Given 08/20/23 1037)  hydrALAZINE (APRESOLINE) tablet 25 mg (25 mg Oral Given 08/20/23 1335)  perflutren lipid microspheres (DEFINITY) IV suspension (2 mLs Intravenous Given 08/20/23 1150)  heparin bolus via infusion 5,000 Units (5,000 Units Intravenous Bolus from Bag 08/19/23 1803)    ED Course/ Medical Decision Making/ A&P                                 Medical Decision Making Well appearing 52 year old presented with PE found on outpatient CT scan. Images reviewed; does have large saddle embolism. Minor elevated HR, but not hypoxic and does not apear in resp distress. CXR clear. ECG without ST segment changes. Troponin with mild elevation. Normal electrolytes and kidney function. Discussed case with Pulmn/Crit. Recommended hosp admission and heparin. Case discussed with hospitalist; Will admit patient.   Amount and/or Complexity of Data Reviewed Labs: ordered. Radiology: ordered.  Risk Decision regarding hospitalization.          Final Clinical Impression(s) / ED Diagnoses Final diagnoses:  Saddle embolus of pulmonary artery, unspecified chronicity, unspecified whether acute cor pulmonale present [facility])    Rx / DC Orders ED  Discharge Orders     None         Coral Spikes, DO 08/20/23 1504

## 2023-08-20 NOTE — Progress Notes (Signed)
PHARMACY - ANTICOAGULATION CONSULT NOTE  Pharmacy Consult for heparin Indication: Saddle PE, acute DVT  Allergies  Allergen Reactions   Other Itching, Rash and Other (See Comments)    Steri Strips cause itching and a rash    Patient Measurements: Height: 5\' 6"  (167.6 cm) Weight: 115.7 kg (255 lb 1.2 oz) IBW/kg (Calculated) : 59.3 Heparin Dosing Weight: 86 kg  Vital Signs: Temp: 98 F (36.7 C) (12/19 0901) Temp Source: Oral (12/19 0901) BP: 139/82 (12/19 1037) Pulse Rate: 98 (12/19 1000)  Labs: Recent Labs    08/19/23 1720 08/19/23 1830 08/19/23 1948 08/20/23 0342 08/20/23 0355  HGB 15.0  --   --   --  13.1  HCT 48.1*  --   --   --  41.9  PLT 209  --   --   --  185  APTT 25  --   --   --   --   LABPROT 14.7  --   --   --   --   INR 1.1  --   --   --   --   HEPARINUNFRC  --   --   --   --  0.46  CREATININE 0.80  --   --  0.74  --   TROPONINIHS 15 21* 21*  --   --     Estimated Creatinine Clearance: 106.4 mL/min (by C-G formula based on SCr of 0.74 mg/dL).   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 Pt with SOB, chest pain. CTA revealed bilateral saddle PE with RHS. Dopplers +acute DVT in right lower extremity. Pharmacy consulted to dose heparin.   Today, 08/20/23 Confirmatory heparin level = 0.58 remains therapeutic on heparin infusion of 1550 units/hr CBC: WNL No bleeding reported Pt on room  air  Copay check requested for both apixaban and rivaroxaban. See note by patient advocate team 12/19.   Goal of Therapy:  Heparin level 0.3-0.7 units/ml Monitor platelets by anticoagulation protocol: Yes   Plan:  Continue heparin at current rate of 1550 units/hr CBC, heparin level daily Monitor for signs of bleeding  Cindi Carbon, PharmD 08/20/2023,11:34 AM

## 2023-08-20 NOTE — Progress Notes (Signed)
PHARMACY - ANTICOAGULATION CONSULT NOTE  Pharmacy Consult for Heparin Indication:  saddle PE  Allergies  Allergen Reactions   Other Itching, Rash and Other (See Comments)    Steri Strips cause itching and a rash    Patient Measurements: Height: 5\' 6"  (167.6 cm) Weight: 115.7 kg (255 lb 1.2 oz) IBW/kg (Calculated) : 59.3 Heparin Dosing Weight: 85 kg  Vital Signs: Temp: 98.2 F (36.8 C) (12/19 0400) Temp Source: Oral (12/19 0400) BP: 149/91 (12/19 0300) Pulse Rate: 91 (12/19 0300)  Labs: Recent Labs    08/19/23 1720 08/19/23 1830 08/19/23 1948 08/20/23 0355  HGB 15.0  --   --  13.1  HCT 48.1*  --   --  41.9  PLT 209  --   --  185  APTT 25  --   --   --   LABPROT 14.7  --   --   --   INR 1.1  --   --   --   HEPARINUNFRC  --   --   --  0.46  CREATININE 0.80  --   --   --   TROPONINIHS 15 21* 21*  --     Estimated Creatinine Clearance: 106.4 mL/min (by C-G formula based on SCr of 0.8 mg/dL).   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 No lung infarction.  Dopplers ordered.  She is not on anticoagulation PTA.  Baseline CBC & coags WNL.  08/20/2023: Initial heparin level 0.46- therapeutic on IV heparin 1550 units/hr CBC- WNL/stable No bleeding or infusion related concerns reported by RN  Goal of Therapy:  Heparin level 0.3-0.7 units/ml Monitor platelets by anticoagulation protocol: Yes   Plan:  Continue  IV heparin 1550 units/hr Check confirmatory heparin level @ noon Daily HL and CBC while on heparin  F/U long-term anticoagulation plans  Junita Push PharmD 08/20/2023,5:18 AM

## 2023-08-21 ENCOUNTER — Other Ambulatory Visit (HOSPITAL_COMMUNITY): Payer: Self-pay

## 2023-08-21 ENCOUNTER — Telehealth: Payer: Self-pay | Admitting: Hematology and Oncology

## 2023-08-21 ENCOUNTER — Encounter: Payer: Self-pay | Admitting: *Deleted

## 2023-08-21 LAB — CBC
HCT: 41.5 % (ref 36.0–46.0)
Hemoglobin: 13 g/dL (ref 12.0–15.0)
MCH: 26.4 pg (ref 26.0–34.0)
MCHC: 31.3 g/dL (ref 30.0–36.0)
MCV: 84.3 fL (ref 80.0–100.0)
Platelets: 210 10*3/uL (ref 150–400)
RBC: 4.92 MIL/uL (ref 3.87–5.11)
RDW: 14.3 % (ref 11.5–15.5)
WBC: 7.2 10*3/uL (ref 4.0–10.5)
nRBC: 0 % (ref 0.0–0.2)

## 2023-08-21 LAB — ANA W/REFLEX IF POSITIVE: Anti Nuclear Antibody (ANA): NEGATIVE

## 2023-08-21 LAB — CARDIOLIPIN ANTIBODIES, IGG, IGM, IGA
Anticardiolipin IgA: <9 [APL'U]/mL (ref 0–11)
Anticardiolipin IgG: <9 [GPL'U]/mL (ref 0–14)
Anticardiolipin IgM: <9 [MPL'U]/mL (ref 0–12)

## 2023-08-21 LAB — HOMOCYSTEINE: Homocysteine: 10.3 umol/L (ref 0.0–14.5)

## 2023-08-21 LAB — HEPARIN LEVEL (UNFRACTIONATED): Heparin Unfractionated: 0.58 [IU]/mL (ref 0.30–0.70)

## 2023-08-21 MED ORDER — HYDROXYZINE HCL 25 MG PO TABS
25.0000 mg | ORAL_TABLET | Freq: Once | ORAL | Status: AC
  Administered 2023-08-21: 25 mg via ORAL
  Filled 2023-08-21: qty 1

## 2023-08-21 MED ORDER — KETOROLAC TROMETHAMINE 30 MG/ML IJ SOLN
30.0000 mg | Freq: Once | INTRAMUSCULAR | Status: AC
  Administered 2023-08-21: 30 mg via INTRAVENOUS
  Filled 2023-08-21: qty 1

## 2023-08-21 MED ORDER — APIXABAN (ELIQUIS) VTE STARTER PACK (10MG AND 5MG)
ORAL_TABLET | ORAL | 0 refills | Status: DC
  Filled 2023-08-21 (×2): qty 74, 30d supply, fill #0

## 2023-08-21 NOTE — Progress Notes (Signed)
Per MD request, message sent to scheduling team for hospital f/u in 3 weeks.

## 2023-08-21 NOTE — Progress Notes (Addendum)
PHARMACY - ANTICOAGULATION CONSULT NOTE  Pharmacy Consult for heparin Indication: Saddle PE, acute DVT  Allergies  Allergen Reactions   Other Itching, Rash and Other (See Comments)    Steri Strips cause itching and a rash    Patient Measurements: Height: 5\' 6"  (167.6 cm) Weight: 115.7 kg (255 lb 1.2 oz) IBW/kg (Calculated) : 59.3 Heparin Dosing Weight: 86 kg  Vital Signs: Temp: 97.7 F (36.5 C) (12/20 0838) Temp Source: Oral (12/20 0838) BP: 154/91 (12/20 0800) Pulse Rate: 80 (12/20 0832)  Labs: Recent Labs    08/19/23 1720 08/19/23 1830 08/19/23 1948 08/20/23 0342 08/20/23 0355 08/20/23 1206 08/21/23 0312  HGB 15.0  --   --   --  13.1  --  13.0  HCT 48.1*  --   --   --  41.9  --  41.5  PLT 209  --   --   --  185  --  210  APTT 25  --   --   --   --   --   --   LABPROT 14.7  --   --   --   --   --   --   INR 1.1  --   --   --   --   --   --   HEPARINUNFRC  --   --   --   --  0.46 0.58 0.58  CREATININE 0.80  --   --  0.74  --   --   --   TROPONINIHS 15 21* 21*  --   --   --   --     Estimated Creatinine Clearance: 106.4 mL/min (by C-G formula based on SCr of 0.74 mg/dL).  Medications: No anticoagulants PTA  Assessment: Pt is a 35 yoF with PMH significant for breast cancer, currently on tamoxifen. Pt with SOB, chest pain. CTA revealed bilateral saddle PE with RHS. Dopplers +acute DVT in right lower extremity. Pharmacy consulted to dose heparin.   Today, 08/21/23   AM heparin level = 0.58 remains therapeutic on heparin infusion of 1550 units/hr CBC: WNL No bleeding reported Pt on room air Co-pay for Eliquis & Xarelto is $110   Goal of Therapy:  Heparin level 0.3-0.7 units/ml Monitor platelets by anticoagulation protocol: Yes   Plan:  Continue heparin at current rate of 1550 units/hr CBC, heparin level daily Monitor for signs of bleeding Plan to transition to Eliquis after 48-72 hrs of IV heparin per CCM recs Will educate patient on Eliquis & provide  30 day free card & $10 co-pay card due to $110 co-pay Pt will STOP taking tamoxifen per Dr Pamelia Hoit  Herby Abraham, Pharm.D Use secure chat for questions 08/21/2023 11:14 AM

## 2023-08-21 NOTE — Plan of Care (Signed)
  Problem: Clinical Measurements: Goal: Respiratory complications will improve Outcome: Progressing Goal: Cardiovascular complication will be avoided Outcome: Progressing   Problem: Activity: Goal: Risk for activity intolerance will decrease Outcome: Progressing   Problem: Coping: Goal: Level of anxiety will decrease Outcome: Progressing   

## 2023-08-21 NOTE — Progress Notes (Signed)
[facility]  PROGRESS NOTE  Erin Gallagher YQM:[id number] DOB: 06-19-1971 DOA: 08/19/2023 PCP: Georgina Quint, MD   Brief HPI:   52 y.o. female with medical history significant of   Obesity, ANA positive, essential hypertension, breast cancer status postradiation and lumpectomy on tamoxifen, history of leukocytoclastic vasculitis mild CAD     Presented with shortness of breath and right leg swelling Since thanksgiving has had right leg swelling but left leg felt numb Mild CP , and SOB went to PCP who got CTA that showed saddle PE  No travel no prior hx of PE  Does not smoke, not on blood thinner  Reports left foot has been feeling numb and hurts more with exertion Decreased pulses on the left One family member had blood clots  Personal hx of breat cancer in 2020 sp lumpectomy, radiation    Assessment/Plan:   Pulmonary embolism with right heart strain -CT scan showed saddle embolus with right heart strain on CT scan -Pulmonology was consulted, did not recommend any aggressive intervention with thrombolysis or thrombectomy -Patient is hemodynamically stable -Plan to continue with heparin for 48 hours and then switch to Eliquis -Patient was taking tamoxifen at home due to history of breast cancer -Called and discussed with oncologist Dr. Georgiann Mohs, he recommends to stop tamoxifen. -Echocardiogram showed moderately reduced right ventricular systolic function, severely elevated pulmonary artery systolic pressure  Pleuritic chest pain -Will give 1 dose of Toradol 30 mg IV  History of leukocytoclastic vasculitis -Patient with history of ANA positive -Hypercoagulable workup in process -Discussed with hematology, okay to start Eliquis and follow-up with hematology as outpatient.   Left leg claudication -Patient found to have DVT in the right peroneal vein -Will discontinue ABIs at this time -Can be done at a later date.  History of breast cancer -Status postlumpectomy  and radiation in 2020 -She has been taking tamoxifen as outpatient -Called and discussed with Dr. Pamelia Hoit.  He recommends to discontinue tamoxifen at this time    Medications     amLODipine  5 mg Oral Daily   Chlorhexidine Gluconate Cloth  6 each Topical Daily     Data Reviewed:   CBG:  No results for input(s): "GLUCAP" in the last 168 hours.  SpO2: 96 %    Vitals:   08/21/23 0200 08/21/23 0400 08/21/23 0413 08/21/23 0600  BP: (!) 143/87  (!) 152/84 (!) 147/95  Pulse: 90  88 85  Resp: (!) 21  18 18   Temp:  98 F (36.7 C)    TempSrc:  Oral    SpO2: 92%  99% 96%  Weight:      Height:          Data Reviewed:  Basic Metabolic Panel: Recent Labs  Lab 08/19/23 1720 08/19/23 1948 08/20/23 0342  NA 136  --  137  K 3.8  --  3.9  CL 106  --  106  CO2 21*  --  21*  GLUCOSE 98  --  94  BUN 11  --  9  CREATININE 0.80  --  0.74  CALCIUM 8.9  --  8.4*  MG  --  2.0 2.0  PHOS  --  3.0 2.7    CBC: Recent Labs  Lab 08/19/23 1720 08/20/23 0355 08/21/23 0312  WBC 10.7* 8.4 7.2  NEUTROABS  --  5.2  --   HGB 15.0 13.1 13.0  HCT 48.1* 41.9 41.5  MCV 84.7 84.1 84.3  PLT 209 185 210    LFT  Recent Labs  Lab 08/19/23 1948 08/20/23 0342  AST 18 17  ALT 20 19  ALKPHOS 52 47  BILITOT 0.6 0.7  PROT 7.1 6.4*  ALBUMIN 3.5 3.3*     Antibiotics: Anti-infectives (From admission, onward)    None        DVT prophylaxis: Heparin  Code Status: Full code  Family Communication: No family at bedside   CONSULTS none   Subjective   Complains of left-sided pleuritic chest pain   Objective    Physical Examination:  General-appears in no acute distress Heart-S1-S2, regular, no murmur auscultated Lungs-clear to auscultation bilaterally, no wheezing or crackles auscultated Chest wall-left chest wall tenderness under left breast Abdomen-soft, nontender, no organomegaly Extremities-no edema in the lower extremities Neuro-alert, oriented x3, no focal  deficit noted   Status is: Inpatient:         Meredeth Ide   [facility] If 7PM-7AM, please contact night-coverage at [email or web address], Office  [phone]   08/21/2023, 7:30 AM  LOS: 1 day

## 2023-08-21 NOTE — Discharge Instructions (Signed)
Information on my medicine - ELIQUIS (apixaban)  This medication education was reviewed with me or my healthcare representative as part of my discharge preparation.   Why was Eliquis prescribed for you? Eliquis was prescribed to treat blood clots that may have been found in the veins of your legs (deep vein thrombosis) or in your lungs (pulmonary embolism) and to reduce the risk of them occurring again.  What do You need to know about Eliquis ? The starting dose is 10 mg (two 5 mg tablets) taken TWICE daily for the FIRST SEVEN (7) DAYS, then the dose is reduced to ONE 5 mg tablet taken TWICE daily.  Eliquis may be taken with or without food.   Try to take the dose about the same time in the morning and in the evening. If you have difficulty swallowing the tablet whole please discuss with your pharmacist how to take the medication safely.  Take Eliquis exactly as prescribed and DO NOT stop taking Eliquis without talking to the doctor who prescribed the medication.  Stopping may increase your risk of developing a new blood clot.  Refill your prescription before you run out.  After discharge, you should have regular check-up appointments with your healthcare provider that is prescribing your Eliquis.    What do you do if you miss a dose? If a dose of ELIQUIS is not taken at the scheduled time, take it as soon as possible on the same day and twice-daily administration should be resumed. The dose should not be doubled to make up for a missed dose.  Important Safety Information A possible side effect of Eliquis is bleeding. You should call your healthcare provider right away if you experience any of the following: Bleeding from an injury or your nose that does not stop. Unusual colored urine (red or dark brown) or unusual colored stools (red or black). Unusual bruising for unknown reasons. A serious fall or if you hit your head (even if there is no bleeding).  Some medicines may  interact with Eliquis and might increase your risk of bleeding or clotting while on Eliquis. To help avoid this, consult your healthcare provider or pharmacist prior to using any new prescription or non-prescription medications, including herbals, vitamins, non-steroidal anti-inflammatory drugs (NSAIDs) and supplements.  This website has more information on Eliquis (apixaban): http://www.eliquis.com/eliquis/home  

## 2023-08-22 ENCOUNTER — Other Ambulatory Visit (HOSPITAL_COMMUNITY): Payer: Self-pay

## 2023-08-22 DIAGNOSIS — I10 Essential (primary) hypertension: Secondary | ICD-10-CM | POA: Diagnosis not present

## 2023-08-22 DIAGNOSIS — I2692 Saddle embolus of pulmonary artery without acute cor pulmonale: Secondary | ICD-10-CM | POA: Diagnosis not present

## 2023-08-22 DIAGNOSIS — Z853 Personal history of malignant neoplasm of breast: Secondary | ICD-10-CM | POA: Diagnosis not present

## 2023-08-22 DIAGNOSIS — R768 Other specified abnormal immunological findings in serum: Secondary | ICD-10-CM | POA: Diagnosis not present

## 2023-08-22 LAB — HEPARIN LEVEL (UNFRACTIONATED): Heparin Unfractionated: 0.41 [IU]/mL (ref 0.30–0.70)

## 2023-08-22 LAB — BETA-2-GLYCOPROTEIN I ABS, IGG/M/A
Beta-2 Glyco I IgG: <9 GPI IgG units (ref 0–20)
Beta-2-Glycoprotein I IgA: <9 GPI IgA units (ref 0–25)
Beta-2-Glycoprotein I IgM: <9 GPI IgM units (ref 0–32)

## 2023-08-22 LAB — CBC
HCT: 42.4 % (ref 36.0–46.0)
Hemoglobin: 13.2 g/dL (ref 12.0–15.0)
MCH: 26.5 pg (ref 26.0–34.0)
MCHC: 31.1 g/dL (ref 30.0–36.0)
MCV: 85 fL (ref 80.0–100.0)
Platelets: 220 10*3/uL (ref 150–400)
RBC: 4.99 MIL/uL (ref 3.87–5.11)
RDW: 14.3 % (ref 11.5–15.5)
WBC: 8.4 10*3/uL (ref 4.0–10.5)
nRBC: 0 % (ref 0.0–0.2)

## 2023-08-22 MED ORDER — APIXABAN 5 MG PO TABS: 5.0000 mg | ORAL_TABLET | Freq: Two times a day (BID) | ORAL | Status: DC

## 2023-08-22 MED ORDER — ONDANSETRON HCL 4 MG/2ML IJ SOLN
4.0000 mg | Freq: Four times a day (QID) | INTRAMUSCULAR | Status: DC | PRN
  Administered 2023-08-22: 4 mg via INTRAVENOUS
  Filled 2023-08-22: qty 2

## 2023-08-22 MED ORDER — ALPRAZOLAM 0.5 MG PO TABS: 0.5000 mg | ORAL_TABLET | Freq: Three times a day (TID) | ORAL | Status: DC | PRN

## 2023-08-22 MED ORDER — APIXABAN 5 MG PO TABS
10.0000 mg | ORAL_TABLET | Freq: Two times a day (BID) | ORAL | Status: DC
  Administered 2023-08-22: 10 mg via ORAL
  Filled 2023-08-22: qty 2

## 2023-08-22 NOTE — Progress Notes (Signed)
PHARMACY - ANTICOAGULATION CONSULT NOTE  Pharmacy Consult for heparin Indication: Saddle PE, acute DVT  Allergies  Allergen Reactions   Other Itching, Rash and Other (See Comments)    Steri Strips cause itching and a rash    Patient Measurements: Height: 5\' 6"  (167.6 cm) Weight: 115.7 kg (255 lb 1.2 oz) IBW/kg (Calculated) : 59.3 Heparin Dosing Weight: 86 kg  Vital Signs: Temp: 98 F (36.7 C) (12/21 0336) Temp Source: Oral (12/21 0336) BP: 128/83 (12/21 0700) Pulse Rate: 80 (12/21 0700)  Labs: Recent Labs    08/19/23 1720 08/19/23 1720 08/19/23 1830 08/19/23 1948 08/20/23 0342 08/20/23 0355 08/20/23 1206 08/21/23 0312 08/22/23 0253  HGB 15.0  --   --   --   --  13.1  --  13.0 13.2  HCT 48.1*  --   --   --   --  41.9  --  41.5 42.4  PLT 209  --   --   --   --  185  --  210 220  APTT 25  --   --   --   --   --   --   --   --   LABPROT 14.7  --   --   --   --   --   --   --   --   INR 1.1  --   --   --   --   --   --   --   --   HEPARINUNFRC  --    < >  --   --   --  0.46 0.58 0.58 0.41  CREATININE 0.80  --   --   --  0.74  --   --   --   --   TROPONINIHS 15  --  21* 21*  --   --   --   --   --    < > = values in this interval not displayed.    Estimated Creatinine Clearance: 106.4 mL/min (by C-G formula based on SCr of 0.74 mg/dL).  Medications: No anticoagulants PTA  Assessment: Pt is a 10 yoF with PMH significant for breast cancer, currently on tamoxifen. Pt with SOB, chest pain. CTA revealed bilateral saddle PE with RHS. Dopplers +acute DVT in right lower extremity. Pharmacy consulted to dose heparin.   Today, 08/22/23   AM heparin level = 0.41 remains therapeutic on heparin infusion of 1550 units/hr CBC: WNL No bleeding reported Pt on room air Co-pay for Eliquis & Xarelto is $110 Eliquis education completed 12/20 Pt given $10 co-pay card 12/20   Goal of Therapy:  Heparin level 0.3-0.7 units/ml Monitor platelets by anticoagulation protocol: Yes    Plan:  Continue heparin at current rate of 1550 units/hr CBC, heparin level daily Monitor for signs of bleeding Plan to transition to Eliquis after 48-72 hrs of IV heparin per CCM recs Pt educated @ Eliquis & given $10 co-pay card & Rx for Eliquis starter pack sent to [facility] Rx for meds to bed    Herby Abraham, Pharm.D Use secure chat for questions 08/22/2023 7:40 AM

## 2023-08-22 NOTE — Discharge Summary (Addendum)
Physician Discharge Summary   Patient: Erin Gallagher MRN: [id number] DOB: [birth year]  Admit date:     08/19/2023  Discharge date: 08/22/23  Discharge Physician: Meredeth Ide   PCP: Georgina Quint, MD   Recommendations at discharge:    Follow up Oncology and PCP as outpatient Take Eliquis as directed  Discharge Diagnoses: Principal Problem:   Pulmonary embolism without acute cor pulmonale (HCC) Active Problems:   Essential hypertension   Morbid obesity (HCC)   History of breast cancer   Suspected sleep apnea   ANA positive   Left leg claudication (HCC)   Prolonged QT interval   Pulmonary embolism (HCC)  Resolved Problems:   * No resolved hospital problems. *  Hospital Course: 52 y.o. female with medical history significant of   Obesity, ANA positive, essential hypertension, breast cancer status postradiation and lumpectomy on tamoxifen, history of leukocytoclastic vasculitis mild CAD     Presented with shortness of breath and right leg swelling Since thanksgiving has had right leg swelling but left leg felt numb Mild CP , and SOB went to PCP who got CTA that showed saddle PE  No travel no prior hx of PE  Does not smoke, not on blood thinner  Reports left foot has been feeling numb and hurts more with exertion Decreased pulses on the left One family member had blood clots  Personal hx of breat cancer in 2020 sp lumpectomy, radiation    Assessment and Plan:  Pulmonary embolism with right heart strain -CT scan showed saddle embolus with right heart strain on CT scan -Pulmonology was consulted, did not recommend any aggressive intervention with thrombolysis or thrombectomy -Patient is hemodynamically stable -Patient was taking tamoxifen at home due to history of breast cancer -Called and discussed with oncologist Dr. Georgiann Mohs, he recommended to stop tamoxifen. -Echocardiogram showed moderately reduced right ventricular systolic function, severely elevated  pulmonary artery systolic pressure Discussed with pulmonology,Plan to continue with heparin for total 72 hours hours and then switch to Eliquis; Completed 72 hours at 6 PM tonight.  Will start Eliquis after stopping heparin   Pleuritic chest pain -Resolved after giving Toradol   History of leukocytoclastic vasculitis -Patient with history of ANA positive -Hypercoagulable workup in process -Discussed with hematology, okay to start Eliquis and follow-up with hematology as outpatient.     DVT -Patient found to have DVT in the right peroneal vein -Will discontinue ABIs at this time -Can be done at a later date.   History of breast cancer -Status postlumpectomy and radiation in 2020 -She has been taking tamoxifen as outpatient -Called and discussed with Dr. Pamelia Hoit.  He recommends to discontinue tamoxifen at this time         Consultants: PCCM Procedures performed: Echo Disposition: Home Diet recommendation:  Discharge Diet Orders (From admission, onward)     Start     Ordered   08/22/23 0000  Diet - low sodium heart healthy        08/22/23 1851           Regular diet DISCHARGE MEDICATION: Allergies as of 08/22/2023       Reactions   Other Itching, Rash, Other (See Comments)   Steri Strips cause itching and a rash        Medication List     STOP taking these medications    diclofenac Sodium 1 % Gel Commonly known as: VOLTAREN   tamoxifen 20 MG tablet Commonly known as: NOLVADEX  TAKE these medications    albuterol 108 (90 Base) MCG/ACT inhaler Commonly known as: VENTOLIN HFA Inhale 2 puffs into the lungs 2 (two) times daily for 5 days. What changed:  when to take this reasons to take this   amLODipine 5 MG tablet Commonly known as: NORVASC Take 1 tablet (5 mg total) by mouth daily.   cyclobenzaprine 10 MG tablet Commonly known as: FLEXERIL Take 1 tablet (10 mg total) by mouth at bedtime. What changed:  when to take this reasons to  take this   Eliquis DVT/PE Starter Pack Generic drug: Apixaban Starter Pack (10mg  and 5mg ) Take as directed on package: start with two-5mg  tablets twice daily for 7 days. On day 8, switch to one-5mg  tablet twice daily.   losartan-hydrochlorothiazide 100-12.5 MG tablet Commonly known as: HYZAAR Take 1 tablet by mouth daily.   nitroGLYCERIN 0.4 MG SL tablet Commonly known as: NITROSTAT Place 1 tablet (0.4 mg total) under the tongue every 5 (five) minutes as needed for chest pain.   oxyCODONE-acetaminophen 10-325 MG tablet Commonly known as: PERCOCET Take 1 tablet by mouth every 8 (eight) hours as needed for pain.   TYLENOL 500 MG tablet Generic drug: acetaminophen Take 500-1,000 mg by mouth every 6 (six) hours as needed for mild pain (pain score 1-3) or headache.   VITAMIN D PO Take 1,000 Units by mouth daily.        Discharge Exam: Filed Weights   08/19/23 1653 08/19/23 1818 08/20/23 0212  Weight: 118 kg 115.2 kg 115.7 kg      08/22/2023    4:00 PM 08/22/2023    3:00 PM 08/22/2023    2:00 PM  Vitals with BMI  Systolic 134 118 409  Diastolic 85 80 81  Pulse 97 82 87    SpO2: 92 %   Exam stable Lungs clear bilaterally Not requiring oxgen  Condition at discharge: good  The results of significant diagnostics from this hospitalization (including imaging, microbiology, ancillary and laboratory) are listed below for reference.   Imaging Studies: ECHOCARDIOGRAM COMPLETE Result Date: 08/20/2023    ECHOCARDIOGRAM REPORT   Patient Name:   Erin Gallagher [facility] Date of Exam: 08/20/2023 Medical Rec #:  [id number]       Height:       66.0 in Accession #:    [id number]      Weight:       255.1 lb Date of Birth:  [birth year]        BSA:          2.218 m Patient Age:    52 years        BP:           155/72 mmHg Patient Gender: F               HR:           88 bpm. Exam Location:  Inpatient Procedure: 2D Echo, Color Doppler, Cardiac Doppler and Intracardiac            Opacification Agent  Indications:    I26.02 Pulmonary embolus  History:        Patient has prior history of Echocardiogram examinations, most                 recent 03/09/2023. Signs/Symptoms:Dyspnea and Shortness of Breath;                 Risk Factors:Hypertension. Breast cancer. Pulmonary embolus. Cor  pulmonale.  Sonographer:    Sheralyn Boatman RDCS Referring Phys: [id number] ANASTASSIA DOUTOVA  Sonographer Comments: Technically difficult study due to poor echo windows and patient is obese. Image acquisition challenging due to patient body habitus. Machine shut down mid exam. IMPRESSIONS  1. Left ventricular ejection fraction, by estimation, is 55 to 60%. The left ventricle has normal function. The left ventricle has no regional wall motion abnormalities. There is severe concentric left ventricular hypertrophy. Indeterminate diastolic filling due to E-A fusion.  2. Right ventricular systolic function is moderately reduced. The right ventricular size is moderately enlarged. Mildly increased right ventricular wall thickness. There is severely elevated pulmonary artery systolic pressure. The estimated right ventricular systolic pressure is 62.8 mmHg.  3. Right atrial size was mildly dilated.  4. The mitral valve is grossly normal. No evidence of mitral valve regurgitation. No evidence of mitral stenosis.  5. Tricuspid valve regurgitation is moderate to severe.  6. The aortic valve is normal in structure. Aortic valve regurgitation is not visualized. No aortic stenosis is present.  7. Pulmonic valve regurgitation is moderate.  8. The inferior vena cava is normal in size with <50% respiratory variability, suggesting right atrial pressure of 8 mmHg. Conclusion(s)/Recommendation(s): The RV is dilated , + RV strain. FINDINGS  Left Ventricle: Left ventricular ejection fraction, by estimation, is 55 to 60%. The left ventricle has normal function. The left ventricle has no regional wall motion abnormalities. Definity contrast agent was given  IV to delineate the left ventricular  endocardial borders. The left ventricular internal cavity size was normal in size. There is severe concentric left ventricular hypertrophy. Indeterminate diastolic filling due to E-A fusion. Right Ventricle: The right ventricular size is moderately enlarged. Mildly increased right ventricular wall thickness. Right ventricular systolic function is moderately reduced. There is severely elevated pulmonary artery systolic pressure. The tricuspid  regurgitant velocity is 3.70 m/s, and with an assumed right atrial pressure of 8 mmHg, the estimated right ventricular systolic pressure is 62.8 mmHg. Left Atrium: Left atrial size was normal in size. Right Atrium: Right atrial size was mildly dilated. Pericardium: There is no evidence of pericardial effusion. Mitral Valve: The mitral valve is grossly normal. No evidence of mitral valve regurgitation. No evidence of mitral valve stenosis. Tricuspid Valve: The tricuspid valve is normal in structure. Tricuspid valve regurgitation is moderate to severe. No evidence of tricuspid stenosis. Aortic Valve: The aortic valve is normal in structure. Aortic valve regurgitation is not visualized. No aortic stenosis is present. Pulmonic Valve: The pulmonic valve was grossly normal. Pulmonic valve regurgitation is moderate. No evidence of pulmonic stenosis. Aorta: The aortic root and ascending aorta are structurally normal, with no evidence of dilitation. Venous: The inferior vena cava is normal in size with less than 50% respiratory variability, suggesting right atrial pressure of 8 mmHg. IAS/Shunts: No atrial level shunt detected by color flow Doppler.  LEFT VENTRICLE PLAX 2D LVIDd:         3.40 cm     Diastology LVIDs:         2.40 cm     LV e' medial:    5.66 cm/s LV PW:         1.60 cm     LV E/e' medial:  9.2 LV IVS:        1.40 cm     LV e' lateral:   8.22 cm/s  LV E/e' lateral: 6.3  LV Volumes (MOD) LV vol d, MOD A2C:  56.9 ml LV vol d, MOD A4C: 45.6 ml LV vol s, MOD A2C: 39.0 ml LV vol s, MOD A4C: 19.6 ml LV SV MOD A2C:     17.9 ml LV SV MOD A4C:     45.6 ml LV SV MOD BP:      22.0 ml RIGHT VENTRICLE            IVC RV S prime:     7.07 cm/s  IVC diam: 1.90 cm TAPSE (M-mode): 2.0 cm LEFT ATRIUM           Index        RIGHT ATRIUM           Index LA diam:      3.00 cm 1.35 cm/m   RA Area:     15.40 cm LA Vol (A2C): 15.0 ml 6.76 ml/m   RA Volume:   39.90 ml  17.99 ml/m LA Vol (A4C): 22.4 ml 10.10 ml/m  AORTIC VALVE             PULMONIC VALVE LVOT Vmax:   85.30 cm/s  PR End Diast Vel: 2.09 msec LVOT Vmean:  54.200 cm/s LVOT VTI:    0.123 m  AORTA Ao Root diam: 2.80 cm Ao Asc diam:  3.40 cm MITRAL VALVE               TRICUSPID VALVE MV Area (PHT): 5.27 cm    TR Peak grad:   54.8 mmHg MV Decel Time: 144 msec    TR Vmax:        370.00 cm/s MV E velocity: 52.00 cm/s MV A velocity: 92.40 cm/s  SHUNTS MV E/A ratio:  0.56        Systemic VTI: 0.12 m Carolan Clines Electronically signed by Carolan Clines Signature Date/Time: 08/20/2023/12:17:51 PM    Final    VAS [place] LOWER EXTREMITY VENOUS (DVT) Result Date: 08/20/2023  Lower Venous DVT Study Patient Name:  Erin Gallagher [facility]  Date of Exam:   08/20/2023 Medical Rec #: [id number]        Accession #:    [id number] Date of Birth: June 18, 1971         Patient Gender: F Patient Age:   31 years Exam Location:  [facility] Procedure:      VAS [place] LOWER EXTREMITY VENOUS (DVT) Referring Phys: Jonny Ruiz DOUTOVA --------------------------------------------------------------------------------  Indications: Pulmonary embolism.  Risk Factors: Confirmed PE. Anticoagulation: Heparin. Limitations: Poor ultrasound/tissue interface. Comparison Study: No prior studies. Performing Technologist: Chanda Busing RVT  Examination Guidelines: A complete evaluation includes B-mode imaging, spectral Doppler, color Doppler, and power Doppler as needed of all accessible portions of each vessel. Bilateral testing is  considered an integral part of a complete examination. Limited examinations for reoccurring indications may be performed as noted. The reflux portion of the exam is performed with the patient in reverse Trendelenburg.  +---------+---------------+---------+-----------+----------+--------------+ RIGHT    CompressibilityPhasicitySpontaneityPropertiesThrombus Aging +---------+---------------+---------+-----------+----------+--------------+ CFV      Full           Yes      Yes                                 +---------+---------------+---------+-----------+----------+--------------+ SFJ      Full                                                        +---------+---------------+---------+-----------+----------+--------------+  FV Prox  Full                                                        +---------+---------------+---------+-----------+----------+--------------+ FV Mid   Full                                                        +---------+---------------+---------+-----------+----------+--------------+ FV DistalFull                                                        +---------+---------------+---------+-----------+----------+--------------+ PFV      Full                                                        +---------+---------------+---------+-----------+----------+--------------+ POP      Full           Yes      Yes                                 +---------+---------------+---------+-----------+----------+--------------+ PTV      Full                                                        +---------+---------------+---------+-----------+----------+--------------+ PERO     None                                         Acute          +---------+---------------+---------+-----------+----------+--------------+   +---------+---------------+---------+-----------+----------+--------------+ LEFT      CompressibilityPhasicitySpontaneityPropertiesThrombus Aging +---------+---------------+---------+-----------+----------+--------------+ CFV      Full           Yes      Yes                                 +---------+---------------+---------+-----------+----------+--------------+ SFJ      Full                                                        +---------+---------------+---------+-----------+----------+--------------+ FV Prox  Full                                                        +---------+---------------+---------+-----------+----------+--------------+  FV Mid   Full                                                        +---------+---------------+---------+-----------+----------+--------------+ FV DistalFull                                                        +---------+---------------+---------+-----------+----------+--------------+ PFV      Full                                                        +---------+---------------+---------+-----------+----------+--------------+ POP      Full           Yes      Yes                                 +---------+---------------+---------+-----------+----------+--------------+ PTV      Full                                                        +---------+---------------+---------+-----------+----------+--------------+ PERO     Full                                                        +---------+---------------+---------+-----------+----------+--------------+     Summary: RIGHT: - Findings consistent with acute deep vein thrombosis involving the right peroneal veins.  - No cystic structure found in the popliteal fossa.  LEFT: - There is no evidence of deep vein thrombosis in the lower extremity.  - No cystic structure found in the popliteal fossa.  *See table(s) above for measurements and observations. Electronically signed by Carolynn Sayers on 08/20/2023 at 12:07:17 PM.    Final    DG  Chest Portable 1 View Result Date: 08/19/2023 CLINICAL DATA:  Shortness of breath EXAM: PORTABLE CHEST 1 VIEW COMPARISON:  08/04/2023 FINDINGS: The heart size and mediastinal contours are within normal limits. Both lungs are clear. The visualized skeletal structures are unremarkable. IMPRESSION: No active disease. Electronically Signed   By: Jasmine Pang M.D.   On: 08/19/2023 19:13   CT Angio Chest W/Cm &/Or Wo Cm Addendum Date: 08/19/2023 ADDENDUM REPORT: 08/19/2023 15:45 ADDENDUM: Critical Value/emergent results were called by telephone at the time of interpretation on 08/19/2023 at 3:44 pm to CMA Melida Quitter. She will convey the message to provider MIGUEL SAGARDIA. The report is already available for review. Electronically Signed   By: Jules Schick M.D.   On: 08/19/2023 15:45   Result Date: 08/19/2023 CLINICAL DATA:  Pulmonary embolism (PE) suspected, high prob. EXAM: CT ANGIOGRAPHY CHEST WITH CONTRAST TECHNIQUE: Multidetector CT imaging of the chest  was performed using the standard protocol during bolus administration of intravenous contrast. Multiplanar CT image reconstructions and MIPs were obtained to evaluate the vascular anatomy. RADIATION DOSE REDUCTION: This exam was performed according to the departmental dose-optimization program which includes automated exposure control, adjustment of the mA and/or kV according to patient size and/or use of iterative reconstruction technique. CONTRAST:  75mL ISOVUE-370 IOPAMIDOL (ISOVUE-370) INJECTION 76% COMPARISON:  None Available. FINDINGS: Cardiovascular: Examination is limited due to suboptimal contrast enhancement. However, having said that, there is moderate-to-large saddle pulmonary embolism extending from the main pulmonary trunk into the bilateral lobar, segmental and subsegmental pulmonary artery branches. There is right heart strain. No lung infarction. Normal cardiac size. No pericardial effusion. No aortic aneurysm. Mediastinum/Nodes:  Visualized thyroid gland appears grossly unremarkable. No solid / cystic mediastinal masses. The esophagus is nondistended precluding optimal assessment. No axillary, mediastinal or hilar lymphadenopathy by size criteria. Lungs/Pleura: The central tracheo-bronchial tree is patent. There are patchy areas of linear, plate-like atelectasis and/or scarring throughout bilateral lungs. No mass or consolidation. No pleural effusion or pneumothorax. No suspicious lung nodules. Upper Abdomen: There is mild diffuse hepatic steatosis. Surgically absent gallbladder. Remaining visualized upper abdominal viscera within normal limits. Musculoskeletal: The visualized soft tissues of the chest wall are grossly unremarkable. No suspicious osseous lesions. There are mild multilevel degenerative changes in the visualized spine. Review of the MIP images confirms the above findings. IMPRESSION: *There is moderate-to-large volume saddle pulmonary embolism with right heart strain. No lung infarction. *Multiple other nonacute observations, as described above. Electronically Signed: By: Jules Schick M.D. On: 08/19/2023 15:35   MM 3D SCREENING MAMMOGRAM BILATERAL BREAST Result Date: 08/07/2023 CLINICAL DATA:  Screening. EXAM: DIGITAL SCREENING BILATERAL MAMMOGRAM WITH TOMOSYNTHESIS AND CAD TECHNIQUE: Bilateral screening digital craniocaudal and mediolateral oblique mammograms were obtained. Bilateral screening digital breast tomosynthesis was performed. The images were evaluated with computer-aided detection. COMPARISON:  Previous exam(s). ACR Breast Density Category b: There are scattered areas of fibroglandular density. FINDINGS: There are no findings suspicious for malignancy. LEFT surgical changes again noted. IMPRESSION: No mammographic evidence of malignancy. A result letter of this screening mammogram will be mailed directly to the patient. RECOMMENDATION: Screening mammogram in one year. (Code:SM-B-01Y) BI-RADS CATEGORY  2:  Benign. Electronically Signed   By: Harmon Pier M.D.   On: 08/07/2023 15:45   DG Chest 2 View Result Date: 08/04/2023 CLINICAL DATA:  52 year old female with cough, chest pain, shortness of breath. EXAM: CHEST - 2 VIEW COMPARISON:  Chest radiographs 12/18/2022 and earlier. FINDINGS: PA and lateral views 1157 hours. Low normal lung volumes. Mediastinal contours remain normal. Visualized tracheal air column is within normal limits. Lung markings appear stable, both lungs appear clear. No pneumothorax or pleural effusion. Stable cholecystectomy clips. No acute osseous abnormality identified. Paucity of bowel gas. IMPRESSION: Negative.  No acute cardiopulmonary abnormality. Electronically Signed   By: Odessa Fleming M.D.   On: 08/04/2023 12:20   MR Lumbar Spine Wo Contrast Result Date: 07/28/2023 CLINICAL DATA:  Low back pain radiating into the left leg for 1 month. No known injury or prior relevant surgery. EXAM: MRI LUMBAR SPINE WITHOUT CONTRAST TECHNIQUE: Multiplanar, multisequence MR imaging of the lumbar spine was performed. No intravenous contrast was administered. COMPARISON:  Lumbar spine radiographs 07/01/2023. Abdominal CT 04/13/2023 FINDINGS: Segmentation: Conventional anatomy assumed, with the last open disc space designated L5-S1.Concordant with prior imaging. Alignment: Straightening without focal angulation or significant listhesis. Vertebrae: No worrisome osseous lesion, acute fracture or pars defect. Mild chronic endplate degenerative  changes at L4-5. The visualized sacroiliac joints appear unremarkable. Conus medullaris: Extends to the L1 level and appears normal. Paraspinal and other soft tissues: No significant paraspinal findings. Disc levels: Sagittal images demonstrate circumferential disc bulging at T11-12 without resulting significant spinal stenosis or foraminal narrowing. L1-2: The disc appears normal. Mild bilateral facet hypertrophy. No spinal stenosis or nerve root encroachment. L2-3: Mild  loss of disc height with mild annular disc bulging. Mild to moderate facet and ligamentous hypertrophy. No significant spinal stenosis or nerve root encroachment. L3-4: Mild loss of disc height with annular disc bulging eccentric to the left. Mild to moderate facet and ligamentous hypertrophy. No significant spinal stenosis or nerve root encroachment. L4-5: Advanced loss of disc height with annular disc bulging and endplate osteophytes asymmetric to the left. Mild to moderate facet and ligamentous hypertrophy. Asymmetric narrowing of the left lateral recess with possible left L5 nerve root encroachment. Mild biforaminal narrowing (greater on the left) without definite exiting L4 nerve root encroachment. The spinal canal is patent. L5-S1: Disc height and hydration are maintained. Moderate bilateral facet hypertrophy. No spinal stenosis or nerve root encroachment. IMPRESSION: 1. No acute findings or clear explanation for the patient's symptoms. 2. 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 There is resulting asymmetric narrowing of the left lateral recess and possible left L5 nerve root encroachment. 3. Mild disc bulging and facet hypertrophy at L2-3 and L3-4 without resulting spinal stenosis or nerve root encroachment. 4. Moderate facet hypertrophy at L5-S1 without resulting spinal stenosis or nerve root encroachment. Electronically Signed   By: Carey Bullocks M.D.   On: 07/28/2023 15:35    Microbiology: Results for orders placed or performed during the hospital encounter of 08/19/23  SARS Coronavirus 2 by RT PCR (hospital order, performed in [facility] hospital lab) *cepheid single result test* Anterior Nasal Swab     Status: None   Collection Time: 08/19/23  8:24 PM   Specimen: Anterior Nasal Swab  Result Value Ref Range Status   SARS Coronavirus 2 by RT PCR NEGATIVE NEGATIVE Final    Comment: (NOTE) SARS-CoV-2 target nucleic acids  are NOT DETECTED.  The SARS-CoV-2 RNA is generally detectable in upper and lower respiratory specimens during the acute phase of infection. The lowest concentration of SARS-CoV-2 viral copies this assay can detect is 250 copies / mL. A negative result does not preclude SARS-CoV-2 infection and should not be used as the sole basis for treatment or other patient management decisions.  A negative result may occur with improper specimen collection / handling, submission of specimen other than nasopharyngeal swab, presence of viral mutation(s) within the areas targeted by this assay, and inadequate number of viral copies (<250 copies / mL). A negative result must be combined with clinical observations, patient history, and epidemiological information.  Fact Sheet for Patients:   RoadLapTop.co.za  Fact Sheet for Healthcare Providers: http://kim-miller.com/  This test is not yet approved or  cleared by the [place] FDA and has been authorized for detection and/or diagnosis of SARS-CoV-2 by FDA under an Emergency Use Authorization (EUA).  This EUA will remain in effect (meaning this test can be used) for the duration of the COVID-19 declaration under Section 564(b)(1) of the Act, 21 U.S.C. section 360bbb-3(b)(1), unless the authorization is terminated or revoked sooner.  Performed at [facility], [address], [place], [place] [address]   MRSA Next Gen by PCR, Nasal     Status:  None   Collection Time: 08/20/23  1:54 AM   Specimen: Nasal Mucosa; Nasal Swab  Result Value Ref Range Status   MRSA by PCR Next Gen NOT DETECTED NOT DETECTED Final    Comment: (NOTE) The GeneXpert MRSA Assay (FDA approved for NASAL specimens only), is one component of a comprehensive MRSA colonization surveillance program. It is not intended to diagnose MRSA infection nor to guide or monitor treatment for MRSA infections. Test performance  is not FDA approved in patients less than 24 years old. Performed at [facility], [address], [place], [place] [address]     Labs: CBC: Recent Labs  Lab 08/19/23 1720 08/20/23 0355 08/21/23 0312 08/22/23 0253  WBC 10.7* 8.4 7.2 8.4  NEUTROABS  --  5.2  --   --   HGB 15.0 13.1 13.0 13.2  HCT 48.1* 41.9 41.5 42.4  MCV 84.7 84.1 84.3 85.0  PLT 209 185 210 220   Basic Metabolic Panel: Recent Labs  Lab 08/19/23 1720 08/19/23 1948 08/20/23 0342  NA 136  --  137  K 3.8  --  3.9  CL 106  --  106  CO2 21*  --  21*  GLUCOSE 98  --  94  BUN 11  --  9  CREATININE 0.80  --  0.74  CALCIUM 8.9  --  8.4*  MG  --  2.0 2.0  PHOS  --  3.0 2.7   Liver Function Tests: Recent Labs  Lab 08/19/23 1948 08/20/23 0342  AST 18 17  ALT 20 19  ALKPHOS 52 47  BILITOT 0.6 0.7  PROT 7.1 6.4*  ALBUMIN 3.5 3.3*   CBG: No results for input(s): "GLUCAP" in the last 168 hours.  Discharge time spent: greater than 30 minutes.  Signed: Meredeth Ide, MD [facility] 08/22/2023

## 2023-08-22 NOTE — Progress Notes (Signed)
[facility]  PROGRESS NOTE  Erin Gallagher:[id number] DOB: Aug 23, 1971 DOA: 08/19/2023 PCP: Georgina Quint, MD   Brief HPI:   52 y.o. female with medical history significant of   Obesity, ANA positive, essential hypertension, breast cancer status postradiation and lumpectomy on tamoxifen, history of leukocytoclastic vasculitis mild CAD     Presented with shortness of breath and right leg swelling Since thanksgiving has had right leg swelling but left leg felt numb Mild CP , and SOB went to PCP who got CTA that showed saddle PE  No travel no prior hx of PE  Does not smoke, not on blood thinner  Reports left foot has been feeling numb and hurts more with exertion Decreased pulses on the left One family member had blood clots  Personal hx of breat cancer in 2020 sp lumpectomy, radiation    Assessment/Plan:   Pulmonary embolism with right heart strain -CT scan showed saddle embolus with right heart strain on CT scan -Pulmonology was consulted, did not recommend any aggressive intervention with thrombolysis or thrombectomy -Patient is hemodynamically stable -Plan to continue with heparin for total 72 hours hours and then switch to Eliquis; will complete 72 hours at 6 PM tonight.  Will start Eliquis after stopping heparin -Patient was taking tamoxifen at home due to history of breast cancer -Called and discussed with oncologist Dr. Georgiann Mohs, he recommends to stop tamoxifen. -Echocardiogram showed moderately reduced right ventricular systolic function, severely elevated pulmonary artery systolic pressure  Pleuritic chest pain -Resolved after giving Toradol  History of leukocytoclastic vasculitis -Patient with history of ANA positive -Hypercoagulable workup in process -Discussed with hematology, okay to start Eliquis and follow-up with hematology as outpatient.   Left leg claudication -Patient found to have DVT in the right peroneal vein -Will discontinue ABIs at this  time -Can be done at a later date.  History of breast cancer -Status postlumpectomy and radiation in 2020 -She has been taking tamoxifen as outpatient -Called and discussed with Dr. Pamelia Hoit.  He recommends to discontinue tamoxifen at this time    Medications     amLODipine  5 mg Oral Daily   Chlorhexidine Gluconate Cloth  6 each Topical Daily     Data Reviewed:   CBG:  No results for input(s): "GLUCAP" in the last 168 hours.  SpO2: 95 %    Vitals:   08/22/23 0336 08/22/23 0400 08/22/23 0504 08/22/23 0700  BP:  (!) 149/96 139/63 128/83  Pulse:  80 84 80  Resp:  (!) 21 (!) 23 17  Temp: 98 F (36.7 C)     TempSrc: Oral     SpO2:  92% 96% 95%  Weight:      Height:          Data Reviewed:  Basic Metabolic Panel: Recent Labs  Lab 08/19/23 1720 08/19/23 1948 08/20/23 0342  NA 136  --  137  K 3.8  --  3.9  CL 106  --  106  CO2 21*  --  21*  GLUCOSE 98  --  94  BUN 11  --  9  CREATININE 0.80  --  0.74  CALCIUM 8.9  --  8.4*  MG  --  2.0 2.0  PHOS  --  3.0 2.7    CBC: Recent Labs  Lab 08/19/23 1720 08/20/23 0355 08/21/23 0312 08/22/23 0253  WBC 10.7* 8.4 7.2 8.4  NEUTROABS  --  5.2  --   --   HGB 15.0 13.1 13.0 13.2  HCT 48.1* 41.9 41.5 42.4  MCV 84.7 84.1 84.3 85.0  PLT 209 185 210 220    LFT Recent Labs  Lab 08/19/23 1948 08/20/23 0342  AST 18 17  ALT 20 19  ALKPHOS 52 47  BILITOT 0.6 0.7  PROT 7.1 6.4*  ALBUMIN 3.5 3.3*     Antibiotics: Anti-infectives (From admission, onward)    None        DVT prophylaxis: Heparin  Code Status: Full code  Family Communication: Discussed with patient's mother at bedside.   CONSULTS none   Subjective   Denies chest pain.  Not requiring oxygen.   Objective    Physical Examination:   General-appears in no acute distress Heart-S1-S2, regular, no murmur auscultated Lungs-clear to auscultation bilaterally, no wheezing or crackles auscultated Abdomen-soft, nontender, no  organomegaly Extremities-no edema in the lower extremities Neuro-alert, oriented x3, no focal deficit noted  Status is: Inpatient:         Meredeth Ide   [facility] If 7PM-7AM, please contact night-coverage at [email or web address], Office  [phone]   08/22/2023, 7:57 AM  LOS: 2 days

## 2023-08-22 NOTE — Plan of Care (Signed)
  Problem: Clinical Measurements: Goal: Cardiovascular complication will be avoided Outcome: Progressing   Problem: Nutrition: Goal: Adequate nutrition will be maintained Outcome: Progressing   Problem: Pain Management: Goal: General experience of comfort will improve Outcome: Progressing   Problem: Safety: Goal: Ability to remain free from injury will improve Outcome: Progressing   Problem: Skin Integrity: Goal: Risk for impaired skin integrity will decrease Outcome: Progressing

## 2023-08-22 NOTE — Progress Notes (Signed)
Pharmacy delivered patient's medication to her at bedside. Patient PIVs removed. All of patient's belongings returned to patient. RN took patient down in wheelchair down to main entrance.

## 2023-08-24 ENCOUNTER — Telehealth: Payer: Self-pay

## 2023-08-24 LAB — LUPUS ANTICOAGULANT PANEL
DRVVT: 41.7 s (ref 0.0–47.0)
PTT Lupus Anticoagulant: 35.7 s (ref 0.0–43.5)

## 2023-08-24 LAB — PROTEIN S ACTIVITY: Protein S Activity: 99 % (ref 63–140)

## 2023-08-24 LAB — PROTEIN S, TOTAL: Protein S Ag, Total: 95 % (ref 60–150)

## 2023-08-24 NOTE — Transitions of Care (Post Inpatient/ED Visit) (Signed)
08/24/2023  Name: Erin Gallagher MRN: [id number] DOB: [birth year]  Today's TOC FU Call Status: Today's TOC FU Call Status:: Successful TOC FU Call Completed TOC FU Call Complete Date: 08/24/23 Patient's Name and Date of Birth confirmed.  Transition Care Management Follow-up Telephone Call Date of Discharge: 08/22/23 Discharge Facility: [facility]) Type of Discharge: Inpatient Admission Primary Inpatient Discharge Diagnosis:: PE How have you been since you were released from the hospital?: Better Any questions or concerns?: No  Items Reviewed: Did you receive and understand the discharge instructions provided?: Yes Medications obtained,verified, and reconciled?: Yes (Medications Reviewed) Any new allergies since your discharge?: No Dietary orders reviewed?: Yes Do you have support at home?: No  Medications Reviewed Today: Medications Reviewed Today     Reviewed by Karena Addison, LPN (Licensed Practical Nurse) on 08/24/23 at 1219  Med List Status: <None>   Medication Order Taking? Sig Documenting Provider Last Dose Status Informant  albuterol (VENTOLIN HFA) 108 (90 Base) MCG/ACT inhaler [id number] No Inhale 2 puffs into the lungs 2 (two) times daily for 5 days.  Patient taking differently: Inhale 2 puffs into the lungs 2 (two) times daily as needed for wheezing or shortness of breath.   Georgina Quint, MD Taking Active Self  amLODipine (NORVASC) 5 MG tablet [id number] No Take 1 tablet (5 mg total) by mouth daily. Georgina Quint, MD Past Week Active Self  APIXABAN Everlene Balls) VTE STARTER PACK (10MG  AND 5MG ) [id number]  Take as directed on package: start with two-5mg  tablets twice daily for 7 days. On day 8, switch to one-5mg  tablet twice daily. Meredeth Ide, MD  Active   cyclobenzaprine (FLEXERIL) 10 MG tablet [id number]  Take 1 tablet (10 mg total) by mouth at bedtime.  Patient taking differently: Take 10 mg by mouth at bedtime as needed for muscle spasms.    Georgina Quint, MD  Active Self  losartan-hydrochlorothiazide [facility]) 100-12.5 MG tablet [id number] No Take 1 tablet by mouth daily. Georgina Quint, MD Past Week Active Self  nitroGLYCERIN (NITROSTAT) 0.4 MG SL tablet [id number] No Place 1 tablet (0.4 mg total) under the tongue every 5 (five) minutes as needed for chest pain. Marykay Lex, MD Taking Active Self  oxyCODONE-acetaminophen (PERCOCET) 10-325 MG tablet [id number]  Take 1 tablet by mouth every 8 (eight) hours as needed for pain. [provider]  Active Self  TYLENOL 500 MG tablet [id number]  Take 500-1,000 mg by mouth every 6 (six) hours as needed for mild pain (pain score 1-3) or headache. [provider]  Active Self  VITAMIN D PO [id number] No Take 1,000 Units by mouth daily. [provider] Past Week Active Self           Med Note Hildred Priest Aug 19, 2023  6:36 PM) The patient said she takes plain "D," not D-3. I asked.            Home Care and Equipment/Supplies: Were Home Health Services Ordered?: NA Any new equipment or medical supplies ordered?: NA  Functional Questionnaire: Do you need assistance with bathing/showering or dressing?: No Do you need assistance with meal preparation?: No Do you need assistance with eating?: No Do you have difficulty maintaining continence: No Do you need assistance with getting out of bed/getting out of a chair/moving?: No Do you have difficulty managing or taking your medications?: No  Follow up appointments reviewed: PCP Follow-up appointment confirmed?: Yes Date of PCP follow-up appointment?: 09/08/23 Follow-up Provider:  [facility] Follow-up appointment confirmed?: Yes Date of Specialist follow-up appointment?: 09/04/23 Follow-Up Specialty Provider:: onco Do you need transportation to your follow-up appointment?: No Do you understand care options if your condition(s) worsen?: Yes-patient verbalized  understanding    SIGNATURE Karena Addison, LPN [facility] Nurse Health Advisor Direct Dial [phone]

## 2023-08-25 LAB — FACTOR 5 LEIDEN

## 2023-08-25 LAB — PROTHROMBIN GENE MUTATION

## 2023-08-28 ENCOUNTER — Encounter: Payer: Self-pay | Admitting: Emergency Medicine

## 2023-09-08 ENCOUNTER — Encounter: Payer: Self-pay | Admitting: Emergency Medicine

## 2023-09-08 ENCOUNTER — Ambulatory Visit (INDEPENDENT_AMBULATORY_CARE_PROVIDER_SITE_OTHER): Payer: 59 | Admitting: Physical Medicine and Rehabilitation

## 2023-09-08 ENCOUNTER — Ambulatory Visit: Payer: Self-pay

## 2023-09-08 ENCOUNTER — Ambulatory Visit (INDEPENDENT_AMBULATORY_CARE_PROVIDER_SITE_OTHER): Payer: 59 | Admitting: Emergency Medicine

## 2023-09-08 VITALS — BP 142/94 | HR 92 | Temp 98.4°F | Ht 66.0 in | Wt 258.0 lb

## 2023-09-08 DIAGNOSIS — M5416 Radiculopathy, lumbar region: Secondary | ICD-10-CM | POA: Diagnosis not present

## 2023-09-08 DIAGNOSIS — Z6841 Body Mass Index (BMI) 40.0 and over, adult: Secondary | ICD-10-CM

## 2023-09-08 DIAGNOSIS — I10 Essential (primary) hypertension: Secondary | ICD-10-CM

## 2023-09-08 DIAGNOSIS — Z09 Encounter for follow-up examination after completed treatment for conditions other than malignant neoplasm: Secondary | ICD-10-CM | POA: Diagnosis not present

## 2023-09-08 DIAGNOSIS — I2692 Saddle embolus of pulmonary artery without acute cor pulmonale: Secondary | ICD-10-CM | POA: Diagnosis not present

## 2023-09-08 MED ORDER — METHYLPREDNISOLONE ACETATE 40 MG/ML IJ SUSP
40.0000 mg | Freq: Once | INTRAMUSCULAR | Status: AC
  Administered 2023-09-08: 40 mg

## 2023-09-08 NOTE — Progress Notes (Signed)
 Erin Gallagher - 53 y.o. female MRN [id number]  Date of birth: 11-11-1970  Office Visit Note: Visit Date: 09/08/2023 PCP: [name], MD Referred by: [name], *  Subjective: Chief Complaint  Patient presents with   Lower Back - Pain   HPI:  Erin Gallagher is a 53 y.o. female who comes in today at the request of Dr. Oneil Herald for planned Left L4-5 Lumbar Transforaminal epidural steroid injection with fluoroscopic guidance.  The patient has failed conservative care including home exercise, medications, time and activity modification.  This injection will be diagnostic and hopefully therapeutic.  Please see requesting physician notes for further details and justification.   ROS Otherwise per HPI.  Assessment & Plan: Visit Diagnoses:    ICD-10-CM   1. Lumbar radiculopathy  M54.16 XR C-ARM NO REPORT    Epidural Steroid injection    methylPREDNISolone  acetate (DEPO-MEDROL ) injection 40 mg      Plan: No additional findings.   Meds & Orders:  Meds ordered this encounter  Medications   methylPREDNISolone  acetate (DEPO-MEDROL ) injection 40 mg    Orders Placed This Encounter  Procedures   XR C-ARM NO REPORT   Epidural Steroid injection    Follow-up: Return in about 4 weeks (around 10/06/2023) for Oneil Herald, MD.   Procedures: No procedures performed  Lumbosacral Transforaminal Epidural Steroid Injection - Sub-Pedicular Approach with Fluoroscopic Guidance  Patient: Erin Gallagher      Date of Birth: 1971/06/08 MRN: [id number] PCP: [name], MD      Visit Date: 09/08/2023   Universal Protocol:    Date/Time: 09/08/2023  Consent Given By: the patient  Position: PRONE  Additional Comments: Vital signs were monitored before and after the procedure. Patient was prepped and draped in the usual sterile fashion. The correct patient, procedure, and site was verified.   Injection Procedure Details:   Procedure diagnoses: Lumbar  radiculopathy [M54.16]    Meds Administered:  Meds ordered this encounter  Medications   methylPREDNISolone  acetate (DEPO-MEDROL ) injection 40 mg    Laterality: Left  Location/Site: L4  Needle:5.0 in., 22 ga.  Short bevel or Quincke spinal needle  Needle Placement: Transforaminal  Findings:    -Comments: Excellent flow of contrast along the nerve, nerve root and into the epidural space.  Procedure Details: After squaring off the end-plates to get a true AP view, the C-arm was positioned so that an oblique view of the foramen as noted above was visualized. The target area is just inferior to the nose of the scotty dog or sub pedicular. The soft tissues overlying this structure were infiltrated with 2-3 ml. of 1% Lidocaine  without Epinephrine .  The spinal needle was inserted toward the target using a trajectory view along the fluoroscope beam.  Under AP and lateral visualization, the needle was advanced so it did not puncture dura and was located close the 6 O'Clock position of the pedical in AP tracterory. Biplanar projections were used to confirm position. Aspiration was confirmed to be negative for CSF and/or blood. A 1-2 ml. volume of Isovue -250 was injected and flow of contrast was noted at each level. Radiographs were obtained for documentation purposes.   After attaining the desired flow of contrast documented above, a 0.5 to 1.0 ml test dose of 0.25% Marcaine  was injected into each respective transforaminal space.  The patient was observed for 90 seconds post injection.  After no sensory deficits were reported, and normal lower extremity motor function was noted,  the above injectate was administered so that equal amounts of the injectate were placed at each foramen (level) into the transforaminal epidural space.   Additional Comments:  The patient tolerated the procedure well Dressing: 2 x 2 sterile gauze and Band-Aid    Post-procedure details: Patient was observed  during the procedure. Post-procedure instructions were reviewed.  Patient left the clinic in stable condition.    Clinical History: MRI LUMBAR SPINE WITHOUT CONTRAST   TECHNIQUE: Multiplanar, multisequence MR imaging of the lumbar spine was performed. No intravenous contrast was administered.   COMPARISON:  Lumbar spine radiographs 07/01/2023. Abdominal CT 04/13/2023   FINDINGS: Segmentation: Conventional anatomy assumed, with the last open disc space designated L5-S1.Concordant with prior imaging.   Alignment: Straightening without focal angulation or significant listhesis.   Vertebrae: No worrisome osseous lesion, acute fracture or pars defect. Mild chronic endplate degenerative changes at L4-5. The visualized sacroiliac joints appear unremarkable.   Conus medullaris: Extends to the L1 level and appears normal.   Paraspinal and other soft tissues: No significant paraspinal findings.   Disc levels:   Sagittal images demonstrate circumferential disc bulging at T11-12 without resulting significant spinal stenosis or foraminal narrowing.   L1-2: The disc appears normal. Mild bilateral facet hypertrophy. No spinal stenosis or nerve root encroachment.   L2-3: Mild loss of disc height with mild annular disc bulging. Mild to moderate facet and ligamentous hypertrophy. No significant spinal stenosis or nerve root encroachment.   L3-4: Mild loss of disc height with annular disc bulging eccentric to the left. Mild to moderate facet and ligamentous hypertrophy. No significant spinal stenosis or nerve root encroachment.   L4-5: Advanced loss of disc height with annular disc bulging and endplate osteophytes asymmetric to the left. Mild to moderate facet and ligamentous hypertrophy. Asymmetric narrowing of the left lateral recess with possible left L5 nerve root encroachment. Mild biforaminal narrowing (greater on the left) without definite exiting L4 nerve root encroachment.  The spinal canal is patent.   L5-S1: Disc height and hydration are maintained. Moderate bilateral facet hypertrophy. No spinal stenosis or nerve root encroachment.   IMPRESSION: 1. No acute findings or clear explanation for the patient's symptoms. 2. 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 There is resulting asymmetric narrowing of the left lateral recess and possible left L5 nerve root encroachment. 3. Mild disc bulging and facet hypertrophy at L2-3 and L3-4 without resulting spinal stenosis or nerve root encroachment. 4. Moderate facet hypertrophy at L5-S1 without resulting spinal stenosis or nerve root encroachment.     Electronically Signed   By: Elsie Perone M.D.   On: 07/28/2023 15:35     Objective:  VS:  HT:    WT:   BMI:     BP:   HR: bpm  TEMP: ( )  RESP:  Physical Exam Vitals and nursing note reviewed.  Constitutional:      General: She is not in acute distress.    Appearance: Normal appearance. She is not ill-appearing.  HENT:     Head: Normocephalic and atraumatic.     Right Ear: External ear normal.     Left Ear: External ear normal.  Eyes:     Extraocular Movements: Extraocular movements intact.  Cardiovascular:     Rate and Rhythm: Normal rate.     Pulses: Normal pulses.  Pulmonary:     Effort: Pulmonary effort is normal. No respiratory distress.  Abdominal:     General:  There is no distension.     Palpations: Abdomen is soft.  Musculoskeletal:        General: Tenderness present.     Cervical back: Neck supple.     Right lower leg: No edema.     Left lower leg: No edema.     Comments: Patient has good distal strength with no pain over the greater trochanters.  No clonus or focal weakness.  Skin:    Findings: No erythema, lesion or rash.  Neurological:     General: No focal deficit present.     Mental Status: She is alert and oriented to person, place, and time.      Sensory: No sensory deficit.     Motor: No weakness or abnormal muscle tone.     Coordination: Coordination normal.  Psychiatric:        Mood and Affect: Mood normal.        Behavior: Behavior normal.      Imaging: XR C-ARM NO REPORT Result Date: 09/08/2023 Please see Notes tab for imaging impression.

## 2023-09-08 NOTE — Assessment & Plan Note (Signed)
 BP Readings from Last 3 Encounters:  09/08/23 (!) 142/94  08/22/23 (!) 144/74  08/17/23 126/88  Continue amlodipine  5 mg daily and Hyzaar  100-12.5 mg daily Elevated blood pressure reading in the office but normal readings at home. Cardiovascular risks associated with hypertension discussed Advised to monitor blood pressure readings at home daily for the next several weeks and keep a log.  Advised to contact the office if numbers persistently abnormal.

## 2023-09-08 NOTE — Assessment & Plan Note (Signed)
 Diet and nutrition discussed.  Advised to decrease amount of daily carbohydrate intake and daily calories and increase amount of plant-based protein in her diet.

## 2023-09-08 NOTE — Patient Instructions (Signed)
Pulmonary Embolism  A pulmonary embolism (PE) is a sudden blockage or decrease of blood flow in one or both lungs that happens when a clot travels into the arteries of the lung (pulmonary arteries). Most blockages come from a blood clot that forms in the vein of a leg or arm (deep vein thrombosis, DVT) and travels to the lungs. A clot is blood that has thickened into a gel or solid. PE is a dangerous and life-threatening condition that needs to be treated right away. What are the causes? This condition is usually caused by a blood clot that forms in a vein and moves to the lungs. In rare cases, it may be caused by air, fat, part of a tumor, or other tissue that moves through the veins and into the lungs. What increases the risk? The following factors may make you more likely to develop this condition: Experiencing a traumatic injury, such as breaking a hip or leg. Having: A spinal cord injury. Major surgery, especially hip or knee replacement, or surgery on parts of the nervous system or on the abdomen. A stroke. A blood-clotting disease. Long-term (chronic) lung or heart disease. Cancer, especially if you are being treated with chemotherapy. A central venous catheter. Taking medicines that contain estrogen. These include birth control pills and hormone replacement therapy. Being: Pregnant. In the period of time after your baby is delivered (postpartum). Older than age 60. Overweight. A smoker, especially if you have other risks. Not very active (sedentary), not being able to move at all, or spending long periods sitting, such as travel over 6 hours. You are also at a greater risk if you have a leg in a cast or splint. What are the signs or symptoms? Symptoms of this condition usually start suddenly and include: Shortness of breath during activity or at rest. Coughing, coughing up blood, or coughing up bloody mucus. Chest pain, back pain, or shoulder blade pain that gets worse with deep  breaths. Rapid or irregular heartbeat. Feeling light-headed or dizzy, or fainting. Feeling anxious. Pain and swelling in a leg. This is a symptom of DVT, which can lead to PE. How is this diagnosed? This condition may be diagnosed based on your medical history, a physical exam, and tests. Tests may include: Blood tests. An ECG (electrocardiogram) of the heart. A CT pulmonary angiogram. This test checks blood flow in and around your lungs. A ventilation-perfusion scan, also called a lung VQ scan. This test measures air flow and blood flow to the lungs. An ultrasound to check for a DVT. How is this treated? Treatment for this condition depends on many factors, such as the cause of your PE, your risk for bleeding or developing more clots, and other medical conditions you may have. Treatment aims to stop blood clots from forming or growing larger. In some cases, treatment may be aimed at breaking apart or removing the blood clot. Treatment may include: Medicines, such as: Blood thinning medicines, also called anticoagulants, to stop clots from forming and growing. Medicines that break apart clots (fibrinolytics). Procedures, such as: Using a flexible tube to remove a blood clot (embolectomy) or to deliver medicine to destroy it (catheter-directed thrombolysis). Surgery to remove the clot (surgical embolectomy). This is rare. You may need a combination of immediate, long-term, and extended treatments. Your treatment may continue for several months (maintenance therapy) or longer depending on your medical conditions. You and your health care provider will work together to choose the treatment program that is best for you.   Follow these instructions at home: Medicines Take over-the-counter and prescription medicines only as told by your health care provider. If you are taking blood thinners: Talk with your health care provider before you take any medicines that contain aspirin or NSAIDs, such as  ibuprofen. These medicines increase your risk for dangerous bleeding. Take your medicine exactly as told, at the same time every day. Avoid activities that could cause injury or bruising, and follow instructions about how to prevent falls. Wear a medical alert bracelet or carry a card that lists what medicines you take. Understand what foods and drugs interact with any medicines that you are taking. General instructions Ask your health care provider when you may return to your normal activities. Avoid sitting or lying for a long time without moving. Maintain a healthy weight. Ask your health care provider what weight is healthy for you. Do not use any products that contain nicotine or tobacco. These products include cigarettes, chewing tobacco, and vaping devices, such as e-cigarettes. If you need help quitting, ask your health care provider. Talk with your health care provider about any travel plans. It is important to make sure that you are still able to take your medicine while traveling. Keep all follow-up visits. This is important. Where to find more information American [employer] Association: www.lung.org Centers for Disease Control and Prevention: www.cdc.gov Contact a health care provider if: You missed a dose of your blood thinner medicine. You have a fever. Get help right away if: You have: New or increased pain, swelling, warmth, or redness in an arm or leg. Shortness of breath that gets worse during activity or at rest. Worsening chest pain. A rapid or irregular heartbeat. A severe headache. Vision changes. A serious fall or accident, or you hit your head. Blood in your vomit, stool, or urine. A cut that will not stop bleeding. You cough up blood. You feel light-headed or dizzy, and that feeling does not go away. You cannot move your arms or legs. You are confused or have memory loss. These symptoms may represent a serious problem that is an emergency. Do not wait to see if the  symptoms will go away. Get medical help right away. Call your local emergency services (911 in the [place] Do not drive yourself to the hospital. Summary A pulmonary embolism (PE) is a serious and potentially life-threatening condition. It happens when a blood clot from one part of the body travels to the arteries of the lung, causing a sudden blockage or decrease of blood flow to the lungs. This may result in shortness of breath, chest pain, dizziness, and fainting. Treatments for this condition usually include medicines to thin your blood (anticoagulants) or medicines to break apart blood clots. If you are given blood thinners, take your medicine exactly as told by your health care provider, at the same time every day. This is important. Understand what foods and drugs interact with any medicines that you are taking. If you have signs of PE or DVT, call your local emergency services (911 in the [place] This information is not intended to replace advice given to you by your health care provider. Make sure you discuss any questions you have with your health care provider. Document Revised: 07/20/2020 Document Reviewed: 07/20/2020 [employer] Patient Education  2024 [employer]  

## 2023-09-08 NOTE — Assessment & Plan Note (Signed)
 Clinic stable.  No more dyspnea at rest. Presently on Eliquis 5 mg twice a day No significant clinical bleeding episodes Fall precautions given

## 2023-09-08 NOTE — Progress Notes (Signed)
 Functional Pain Scale - descriptive words and definitions  Uncomfortable (3)  Pain is present but can complete all ADL's/sleep is slightly affected and passive distraction only gives marginal relief. Mild range order  Average Pain 3  R side pain  He stated the pain comes sharp every now and then.  +Driver, -BT, -Dye Allergies.

## 2023-09-08 NOTE — Progress Notes (Signed)
 Erin Gallagher 53 y.o.   Chief Complaint  Patient presents with   Hospitalization Follow-up    Patient states she is still having SOB but is feeling a little better     HISTORY OF PRESENT ILLNESS: This is a 53 y.o. female here for hospital discharge follow-up when she was admitted with acute pulmonary embolism secondary to DVT right peroneal deep vein Presently on Eliquis  twice a day Overall feeling better. Hospital discharge summary as follows:  Physician Discharge Summary    Patient: Erin Gallagher MRN: [id number] DOB: 06/16/1971  Admit date:     08/19/2023  Discharge date: 08/22/23  Discharge Physician: Sabas GORMAN Brod    PCP: [name], MD    Recommendations at discharge:     Follow up Oncology and PCP as outpatient Take Eliquis  as directed   Discharge Diagnoses: Principal Problem:   Pulmonary embolism without acute cor pulmonale (HCC) Active Problems:   Essential hypertension   Morbid obesity (HCC)   History of breast cancer   Suspected sleep apnea   ANA positive   Left leg claudication (HCC)   Prolonged QT interval   Pulmonary embolism (HCC)   Resolved Problems:   * No resolved hospital problems. *   Hospital Course: 53 y.o. female with medical history significant of   Obesity, ANA positive, essential hypertension, breast cancer status postradiation and lumpectomy on tamoxifen , history of leukocytoclastic vasculitis mild CAD     Presented with shortness of breath and right leg swelling Since thanksgiving has had right leg swelling but left leg felt numb Mild CP , and SOB went to PCP who got CTA that showed saddle PE  No travel no prior hx of PE  Does not smoke, not on blood thinner  Reports left foot has been feeling numb and hurts more with exertion Decreased pulses on the left One family member had blood clots  Personal hx of breat cancer in 2020 sp lumpectomy, radiation     Assessment and Plan:   Pulmonary embolism with right heart  strain -CT scan showed saddle embolus with right heart strain on CT scan -Pulmonology was consulted, did not recommend any aggressive intervention with thrombolysis or thrombectomy -Patient is hemodynamically stable -Patient was taking tamoxifen  at home due to history of breast cancer -Called and discussed with oncologist Dr. Gudina, he recommended to stop tamoxifen . -Echocardiogram showed moderately reduced right ventricular systolic function, severely elevated pulmonary artery systolic pressure Discussed with pulmonology,Plan to continue with heparin  for total 72 hours hours and then switch to Eliquis ; Completed 72 hours at 6 PM tonight.  Will start Eliquis  after stopping heparin    Pleuritic chest pain -Resolved after giving Toradol    History of leukocytoclastic vasculitis -Patient with history of ANA positive -Hypercoagulable workup in process -Discussed with hematology, okay to start Eliquis  and follow-up with hematology as outpatient.     DVT -Patient found to have DVT in the right peroneal vein -Will discontinue ABIs at this time -Can be done at a later date.   History of breast cancer -Status postlumpectomy and radiation in 2020 -She has been taking tamoxifen  as outpatient -Called and discussed with Dr. Gudena.  He recommends to discontinue tamoxifen  at this time     HPI   Prior to Admission medications   Medication Sig Start Date End Date Taking? Authorizing Provider  amLODipine  (NORVASC ) 5 MG tablet Take 1 tablet (5 mg total) by mouth daily. 03/11/23  Yes Sujey Gundry, Emil Schanz, MD  APIXABAN  (ELIQUIS ) VTE STARTER PACK (  10MG  AND 5MG ) Take as directed on package: start with two-5mg  tablets twice daily for 7 days. On day 8, switch to one-5mg  tablet twice daily. 08/21/23  Yes Drusilla Sabas RAMAN, MD  cyclobenzaprine  (FLEXERIL ) 10 MG tablet Take 1 tablet (10 mg total) by mouth at bedtime. Patient taking differently: Take 10 mg by mouth at bedtime as needed for muscle spasms. 07/01/23   Yes Dezman Granda, Emil Schanz, MD  losartan -hydrochlorothiazide  (HYZAAR ) 100-12.5 MG tablet Take 1 tablet by mouth daily. 03/11/23  Yes Evalina Tabak, Emil Schanz, MD  nitroGLYCERIN  (NITROSTAT ) 0.4 MG SL tablet Place 1 tablet (0.4 mg total) under the tongue every 5 (five) minutes as needed for chest pain. 02/02/23  Yes Anner Alm ORN, MD  oxyCODONE -acetaminophen  (PERCOCET) 10-325 MG tablet Take 1 tablet by mouth every 8 (eight) hours as needed for pain.   Yes [provider]  TYLENOL  500 MG tablet Take 500-1,000 mg by mouth every 6 (six) hours as needed for mild pain (pain score 1-3) or headache.   Yes [provider]  VITAMIN D  PO Take 1,000 Units by mouth daily.   Yes [provider]  albuterol  (VENTOLIN  HFA) 108 (90 Base) MCG/ACT inhaler Inhale 2 puffs into the lungs 2 (two) times daily for 5 days. Patient taking differently: Inhale 2 puffs into the lungs 2 (two) times daily as needed for wheezing or shortness of breath. 08/16/23 09/06/23  [name], MD    Allergies  Allergen Reactions   Other Itching, Rash and Other (See Comments)    Steri Strips cause itching and a rash    Patient Active Problem List   Diagnosis Date Noted   Pulmonary embolism (HCC) 08/20/2023   Pulmonary embolism without acute cor pulmonale (HCC) 08/19/2023   Left leg claudication (HCC) 08/19/2023   Prolonged QT interval 08/19/2023   Flu-like symptoms 08/04/2023   Degeneration of intervertebral disc of lumbar region with discogenic back pain and lower extremity pain 07/29/2023   Left hip pain 07/01/2023   Left sided sciatica 07/01/2023   Bilateral swelling of feet and ankles 03/11/2023   Metabolic syndrome 02/08/2023   DOE (dyspnea on exertion) 02/02/2023   Situational hypertension 02/02/2023   Family history of coronary artery disease 12/18/2022   Pes planus 05/13/2022   ANA positive 01/20/2022   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   Suspected sleep apnea 10/30/2021   BMI  40.0-44.9, adult (HCC) 02/06/2021   Morbid obesity (HCC) 02/06/2021   History of breast cancer 02/06/2021   Malignant neoplasm of upper-outer quadrant of left breast in female, 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   Arthritis 11/23/2017   Essential hypertension 06/22/2013    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   Personal history of radiation therapy     Past Surgical History:  Procedure Laterality Date   BREAST BIOPSY Left 05/16/2019   BREAST LUMPECTOMY Left 06/14/2019   BREAST LUMPECTOMY WITH RADIOACTIVE SEED AND SENTINEL LYMPH NODE BIOPSY Left 06/14/2019   Procedure: LEFT BREAST LUMPECTOMY WITH RADIOACTIVE SEED AND SENTINEL LYMPH NODE BIOPSY;  Surgeon: Aron,  Jina, MD;  Location: [facility];  Service: General;  Laterality: Left;   CESAREAN SECTION     1997   CHOLECYSTECTOMY     TUBAL LIGATION     1998    Social History   Socioeconomic History   Marital status: Married    Spouse name: Not on file   Number of children: 1   Years of education: Not on file   Highest education level: 12th grade  Occupational History   Occupation: Merchandiser, Retail  Tobacco Use   Smoking status: Never    Passive exposure: Current   Smokeless tobacco: Never  Vaping Use   Vaping status: Never Used  Substance and Sexual Activity   Alcohol use: Yes    Alcohol/week: 2.0 standard drinks of alcohol    Types: 2 Standard drinks or equivalent per week    Comment: occasionally - 2 out of a 7 day period   Drug use: No   Sexual activity: Yes    Birth control/protection: Surgical    Comment: Tubal ligation   Other Topics Concern   Not on file  Social History Narrative   Caffiene 1 cup coffee daily.    Lives home with spouse   Works at [employer]   Education 23yrs   Children one.    Social Drivers of Corporate Investment Banker Strain: Low Risk  (08/16/2023)   Overall Financial Resource Strain (CARDIA)    Difficulty of Paying Living Expenses: Not very hard  Food Insecurity: No Food Insecurity (08/20/2023)   Hunger Vital Sign    Worried About Running Out of Food in the Last Year: Never true    Ran Out of Food in the Last Year: Never true  Transportation Needs: No Transportation Needs (08/20/2023)   PRAPARE - Administrator, Civil Service (Medical): No    Lack of Transportation (Non-Medical): No  Physical Activity: Insufficiently Active (08/16/2023)   Exercise Vital Sign    Days of Exercise per Week: 3 days    Minutes of Exercise per Session: 30 min  Stress: No Stress Concern Present (08/16/2023)   [employer] of Occupational Health - Occupational Stress Questionnaire    Feeling of Stress : Only a little  Recent Concern: Stress - Stress Concern Present (06/30/2023)   [employer] of Occupational Health - Occupational Stress Questionnaire    Feeling of Stress : To some extent  Social Connections: Unknown (08/16/2023)   Social Connection and Isolation Panel [NHANES]    Frequency of Communication with Friends and Family: More than three times a week    Frequency of Social Gatherings with Friends and Family: Twice a week    Attends Religious Services: Patient declined    Database Administrator or Organizations: No    Attends Engineer, Structural: Not on file    Marital Status: Married  Catering Manager Violence: Not At Risk (08/20/2023)   Humiliation, Afraid, Rape, and Kick questionnaire    Fear of Current or Ex-Partner: No    Emotionally Abused: No    Physically Abused: No    Sexually Abused: No    Family History  Problem Relation Age of Onset    Hypertension Mother    Hypertension Father    Colon cancer Father        dx 80s   Throat cancer Maternal Uncle    Lung cancer Paternal Uncle    Stroke Maternal Grandmother    Hypertension Maternal Grandmother    Aneurysm Maternal  Grandmother    Healthy Son      Review of Systems  Constitutional: Negative.  Negative for chills and fever.  HENT:  Positive for nosebleeds. Negative for congestion and sore throat.   Respiratory: Negative.  Negative for cough, hemoptysis and shortness of breath.   Cardiovascular: Negative.  Negative for chest pain and palpitations.  Gastrointestinal:  Negative for abdominal pain, diarrhea, nausea and vomiting.  Genitourinary: Negative.  Negative for dysuria and hematuria.  Skin: Negative.  Negative for rash.  Neurological: Negative.  Negative for dizziness and headaches.  All other systems reviewed and are negative.   Vitals:   09/08/23 1448  BP: (!) 142/94  Pulse: 92  Temp: 98.4 F (36.9 C)  SpO2: 96%    Physical Exam Vitals reviewed.  Constitutional:      Appearance: Normal appearance. She is obese.  HENT:     Head: Normocephalic.     Mouth/Throat:     Mouth: Mucous membranes are moist.     Pharynx: Oropharynx is clear.  Eyes:     Extraocular Movements: Extraocular movements intact.     Conjunctiva/sclera: Conjunctivae normal.     Pupils: Pupils are equal, round, and reactive to light.  Cardiovascular:     Rate and Rhythm: Normal rate and regular rhythm.     Pulses: Normal pulses.     Heart sounds: Normal heart sounds.  Pulmonary:     Effort: Pulmonary effort is normal.     Breath sounds: Normal breath sounds.  Musculoskeletal:        General: No tenderness.     Cervical back: No tenderness.  Lymphadenopathy:     Cervical: No cervical adenopathy.  Skin:    General: Skin is warm and dry.  Neurological:     Mental Status: She is alert and oriented to person, place, and time.  Psychiatric:        Mood and Affect: Mood  normal.        Behavior: Behavior normal.      ASSESSMENT & PLAN: A total of 46 minutes was spent with the patient and counseling/coordination of care regarding preparing for this visit, review of most recent office visit notes, review of multiple chronic medical conditions and their management, review of recent hospital discharge summary, review of all medications, review of most recent bloodwork results, review of health maintenance items, education on nutrition, prognosis, documentation, and need for follow up.   Problem List Items Addressed This Visit       Cardiovascular and Mediastinum   Essential hypertension (Chronic)   BP Readings from Last 3 Encounters:  09/08/23 (!) 142/94  08/22/23 (!) 144/74  08/17/23 126/88  Continue amlodipine  5 mg daily and Hyzaar  100-12.5 mg daily Elevated blood pressure reading in the office but normal readings at home. Cardiovascular risks associated with hypertension discussed Advised to monitor blood pressure readings at home daily for the next several weeks and keep a log.  Advised to contact the office if numbers persistently abnormal.       Pulmonary embolism (HCC) - Primary   Clinic stable.  No more dyspnea at rest. Presently on Eliquis  5 mg twice a day No significant clinical bleeding episodes Fall precautions given        Other   BMI 40.0-44.9, adult (HCC) (Chronic)   Diet and nutrition discussed. Advised to decrease amount of daily carbohydrate intake and daily calories and increase amount of plant-based protein in her diet      Other Visit Diagnoses  Hospital discharge follow-up          Patient Instructions  Pulmonary Embolism  A pulmonary embolism (PE) is a sudden blockage or decrease of blood flow in one or both lungs that happens when a clot travels into the arteries of the lung (pulmonary arteries). Most blockages come from a blood clot that forms in the vein of a leg or arm (deep vein thrombosis, DVT) and  travels to the lungs. A clot is blood that has thickened into a gel or solid. PE is a dangerous and life-threatening condition that needs to be treated right away. What are the causes? This condition is usually caused by a blood clot that forms in a vein and moves to the lungs. In rare cases, it may be caused by air, fat, part of a tumor, or other tissue that moves through the veins and into the lungs. What increases the risk? The following factors may make you more likely to develop this condition: Experiencing a traumatic injury, such as breaking a hip or leg. Having: A spinal cord injury. Major surgery, especially hip or knee replacement, or surgery on parts of the nervous system or on the abdomen. A stroke. A blood-clotting disease. Long-term (chronic) lung or heart disease. Cancer, especially if you are being treated with chemotherapy. A central venous catheter. Taking medicines that contain estrogen. These include birth control pills and hormone replacement therapy. Being: Pregnant. In the period of time after your baby is delivered (postpartum). Older than age 66. Overweight. A smoker, especially if you have other risks. Not very active (sedentary), not being able to move at all, or spending long periods sitting, such as travel over 6 hours. You are also at a greater risk if you have a leg in a cast or splint. What are the signs or symptoms? Symptoms of this condition usually start suddenly and include: Shortness of breath during activity or at rest. Coughing, coughing up blood, or coughing up bloody mucus. Chest pain, back pain, or shoulder blade pain that gets worse with deep breaths. Rapid or irregular heartbeat. Feeling light-headed or dizzy, or fainting. Feeling anxious. Pain and swelling in a leg. This is a symptom of DVT, which can lead to PE. How is this diagnosed? This condition may be diagnosed based on your medical history, a physical exam, and tests. Tests may  include: Blood tests. An ECG (electrocardiogram) of the heart. A CT pulmonary angiogram. This test checks blood flow in and around your lungs. A ventilation-perfusion scan, also called a lung VQ scan. This test measures air flow and blood flow to the lungs. An ultrasound to check for a DVT. How is this treated? Treatment for this condition depends on many factors, such as the cause of your PE, your risk for bleeding or developing more clots, and other medical conditions you may have. Treatment aims to stop blood clots from forming or growing larger. In some cases, treatment may be aimed at breaking apart or removing the blood clot. Treatment may include: Medicines, such as: Blood thinning medicines, also called anticoagulants, to stop clots from forming and growing. Medicines that break apart clots (fibrinolytics). Procedures, such as: Using a flexible tube to remove a blood clot (embolectomy) or to deliver medicine to destroy it (catheter-directed thrombolysis). Surgery to remove the clot (surgical embolectomy). This is rare. You may need a combination of immediate, long-term, and extended treatments. Your treatment may continue for several months (maintenance therapy) or longer depending on your medical conditions. You and your  health care provider will work together to choose the treatment program that is best for you. Follow these instructions at home: Medicines Take over-the-counter and prescription medicines only as told by your health care provider. If you are taking blood thinners: Talk with your health care provider before you take any medicines that contain aspirin or NSAIDs, such as ibuprofen. These medicines increase your risk for dangerous bleeding. Take your medicine exactly as told, at the same time every day. Avoid activities that could cause injury or bruising, and follow instructions about how to prevent falls. Wear a medical alert bracelet or carry a card that lists what  medicines you take. Understand what foods and drugs interact with any medicines that you are taking. General instructions Ask your health care provider when you may return to your normal activities. Avoid sitting or lying for a long time without moving. Maintain a healthy weight. Ask your health care provider what weight is healthy for you. Do not use any products that contain nicotine or tobacco. These products include cigarettes, chewing tobacco, and vaping devices, such as e-cigarettes. If you need help quitting, ask your health care provider. Talk with your health care provider about any travel plans. It is important to make sure that you are still able to take your medicine while traveling. Keep all follow-up visits. This is important. Where to find more information American Lung Association: www.lung.org Centers for Disease Control and Prevention: footballexhibition.com.br Contact a health care provider if: You missed a dose of your blood thinner medicine. You have a fever. Get help right away if: You have: New or increased pain, swelling, warmth, or redness in an arm or leg. Shortness of breath that gets worse during activity or at rest. Worsening chest pain. A rapid or irregular heartbeat. A severe headache. Vision changes. A serious fall or accident, or you hit your head. Blood in your vomit, stool, or urine. A cut that will not stop bleeding. You cough up blood. You feel light-headed or dizzy, and that feeling does not go away. You cannot move your arms or legs. You are confused or have memory loss. These symptoms may represent a serious problem that is an emergency. Do not wait to see if the symptoms will go away. Get medical help right away. Call your local emergency services (911 in the [place] Do not drive yourself to the hospital. Summary A pulmonary embolism (PE) is a serious and potentially life-threatening condition. It happens when a blood clot from one part of the body travels to  the arteries of the lung, causing a sudden blockage or decrease of blood flow to the lungs. This may result in shortness of breath, chest pain, dizziness, and fainting. Treatments for this condition usually include medicines to thin your blood (anticoagulants) or medicines to break apart blood clots. If you are given blood thinners, take your medicine exactly as told by your health care provider, at the same time every day. This is important. Understand what foods and drugs interact with any medicines that you are taking. If you have signs of PE or DVT, call your local emergency services (911 in the [place] This information is not intended to replace advice given to you by your health care provider. Make sure you discuss any questions you have with your health care provider. Document Revised: 07/20/2020 Document Reviewed: 07/20/2020 Elsevier Patient Education  2024 Elsevier Inc.      Emil Schaumann, MD [facility]

## 2023-09-08 NOTE — Procedures (Signed)
 Lumbosacral Transforaminal Epidural Steroid Injection - Sub-Pedicular Approach with Fluoroscopic Guidance  Patient: Erin Gallagher      Date of Birth: 1971-04-01 MRN: [id number] PCP: [name], MD      Visit Date: 09/08/2023   Universal Protocol:    Date/Time: 09/08/2023  Consent Given By: the patient  Position: PRONE  Additional Comments: Vital signs were monitored before and after the procedure. Patient was prepped and draped in the usual sterile fashion. The correct patient, procedure, and site was verified.   Injection Procedure Details:   Procedure diagnoses: Lumbar radiculopathy [M54.16]    Meds Administered:  Meds ordered this encounter  Medications   methylPREDNISolone  acetate (DEPO-MEDROL ) injection 40 mg    Laterality: Left  Location/Site: L4  Needle:5.0 in., 22 ga.  Short bevel or Quincke spinal needle  Needle Placement: Transforaminal  Findings:    -Comments: Excellent flow of contrast along the nerve, nerve root and into the epidural space.  Procedure Details: After squaring off the end-plates to get a true AP view, the C-arm was positioned so that an oblique view of the foramen as noted above was visualized. The target area is just inferior to the nose of the scotty dog or sub pedicular. The soft tissues overlying this structure were infiltrated with 2-3 ml. of 1% Lidocaine  without Epinephrine .  The spinal needle was inserted toward the target using a trajectory view along the fluoroscope beam.  Under AP and lateral visualization, the needle was advanced so it did not puncture dura and was located close the 6 O'Clock position of the pedical in AP tracterory. Biplanar projections were used to confirm position. Aspiration was confirmed to be negative for CSF and/or blood. A 1-2 ml. volume of Isovue -250 was injected and flow of contrast was noted at each level. Radiographs were obtained for documentation purposes.   After attaining the desired  flow of contrast documented above, a 0.5 to 1.0 ml test dose of 0.25% Marcaine  was injected into each respective transforaminal space.  The patient was observed for 90 seconds post injection.  After no sensory deficits were reported, and normal lower extremity motor function was noted,   the above injectate was administered so that equal amounts of the injectate were placed at each foramen (level) into the transforaminal epidural space.   Additional Comments:  The patient tolerated the procedure well Dressing: 2 x 2 sterile gauze and Band-Aid    Post-procedure details: Patient was observed during the procedure. Post-procedure instructions were reviewed.  Patient left the clinic in stable condition.

## 2023-09-09 ENCOUNTER — Encounter: Payer: Self-pay | Admitting: Emergency Medicine

## 2023-09-16 ENCOUNTER — Inpatient Hospital Stay: Payer: 59

## 2023-09-16 ENCOUNTER — Other Ambulatory Visit (HOSPITAL_COMMUNITY): Payer: Self-pay

## 2023-09-16 ENCOUNTER — Encounter: Payer: Self-pay | Admitting: Adult Health

## 2023-09-16 ENCOUNTER — Inpatient Hospital Stay: Payer: 59 | Attending: Hematology and Oncology | Admitting: Adult Health

## 2023-09-16 VITALS — BP 114/50 | HR 100 | Temp 97.3°F | Resp 18 | Wt 256.5 lb

## 2023-09-16 DIAGNOSIS — Z170 Estrogen receptor positive status [ER+]: Secondary | ICD-10-CM

## 2023-09-16 DIAGNOSIS — Z6841 Body Mass Index (BMI) 40.0 and over, adult: Secondary | ICD-10-CM | POA: Insufficient documentation

## 2023-09-16 DIAGNOSIS — Z923 Personal history of irradiation: Secondary | ICD-10-CM | POA: Diagnosis not present

## 2023-09-16 DIAGNOSIS — Z801 Family history of malignant neoplasm of trachea, bronchus and lung: Secondary | ICD-10-CM | POA: Insufficient documentation

## 2023-09-16 DIAGNOSIS — C50412 Malignant neoplasm of upper-outer quadrant of left female breast: Secondary | ICD-10-CM | POA: Insufficient documentation

## 2023-09-16 DIAGNOSIS — Z800 Family history of malignant neoplasm of digestive organs: Secondary | ICD-10-CM | POA: Diagnosis not present

## 2023-09-16 DIAGNOSIS — I2692 Saddle embolus of pulmonary artery without acute cor pulmonale: Secondary | ICD-10-CM

## 2023-09-16 DIAGNOSIS — Z803 Family history of malignant neoplasm of breast: Secondary | ICD-10-CM | POA: Diagnosis not present

## 2023-09-16 DIAGNOSIS — Z1721 Progesterone receptor positive status: Secondary | ICD-10-CM | POA: Insufficient documentation

## 2023-09-16 DIAGNOSIS — Z1732 Human epidermal growth factor receptor 2 negative status: Secondary | ICD-10-CM | POA: Insufficient documentation

## 2023-09-16 LAB — CMP (CANCER CENTER ONLY)
ALT: 21 U/L (ref 0–44)
AST: 18 U/L (ref 15–41)
Albumin: 4.1 g/dL (ref 3.5–5.0)
Alkaline Phosphatase: 57 U/L (ref 38–126)
Anion gap: 7 (ref 5–15)
BUN: 11 mg/dL (ref 6–20)
CO2: 25 mmol/L (ref 22–32)
Calcium: 9.5 mg/dL (ref 8.9–10.3)
Chloride: 107 mmol/L (ref 98–111)
Creatinine: 0.79 mg/dL (ref 0.44–1.00)
GFR, Estimated: >60 mL/min (ref >60)
Glucose, Bld: 101 mg/dL — ABNORMAL HIGH (ref 70–99)
Potassium: 3.9 mmol/L (ref 3.5–5.1)
Sodium: 139 mmol/L (ref 135–145)
Total Bilirubin: 0.5 mg/dL (ref 0.0–1.2)
Total Protein: 7.5 g/dL (ref 6.5–8.1)

## 2023-09-16 LAB — CBC WITH DIFFERENTIAL (CANCER CENTER ONLY)
Abs Immature Granulocytes: 0.02 10*3/uL (ref 0.00–0.07)
Basophils Absolute: 0 10*3/uL (ref 0.0–0.1)
Basophils Relative: 0 %
Eosinophils Absolute: 0.1 10*3/uL (ref 0.0–0.5)
Eosinophils Relative: 1 %
HCT: 43.7 % (ref 36.0–46.0)
Hemoglobin: 14.3 g/dL (ref 12.0–15.0)
Immature Granulocytes: 0 %
Lymphocytes Relative: 23 %
Lymphs Abs: 1.8 10*3/uL (ref 0.7–4.0)
MCH: 26.4 pg (ref 26.0–34.0)
MCHC: 32.7 g/dL (ref 30.0–36.0)
MCV: 80.6 fL (ref 80.0–100.0)
Monocytes Absolute: 0.4 10*3/uL (ref 0.1–1.0)
Monocytes Relative: 5 %
Neutro Abs: 5.6 10*3/uL (ref 1.7–7.7)
Neutrophils Relative %: 71 %
Platelet Count: 280 10*3/uL (ref 150–400)
RBC: 5.42 MIL/uL — ABNORMAL HIGH (ref 3.87–5.11)
RDW: 14.1 % (ref 11.5–15.5)
WBC Count: 7.9 10*3/uL (ref 4.0–10.5)
nRBC: 0 % (ref 0.0–0.2)

## 2023-09-16 MED ORDER — APIXABAN 5 MG PO TABS
5.0000 mg | ORAL_TABLET | Freq: Two times a day (BID) | ORAL | 5 refills | Status: DC
  Filled 2023-09-16 (×2): qty 60, 30d supply, fill #0
  Filled 2023-10-26: qty 60, 30d supply, fill #1
  Filled 2023-11-30: qty 60, 30d supply, fill #2
  Filled 2023-12-25: qty 60, 30d supply, fill #3
  Filled 2024-05-25: qty 60, 30d supply, fill #4
  Filled 2024-07-22: qty 60, 30d supply, fill #5

## 2023-09-16 NOTE — Progress Notes (Signed)
[facility] Cancer Follow up:    Georgina Quint, MD [address] [place] [address]   DIAGNOSIS:  Cancer Staging  Malignant neoplasm of upper-outer quadrant of left breast in female, estrogen receptor positive (HCC) Staging form: Breast, AJCC 8th Edition - Clinical stage from 05/25/2019: Stage IA (cT1c, cN0, cM0, G1, ER+, PR+, HER2-) - Unsigned Stage prefix: Initial diagnosis Method of lymph node assessment: Clinical Histologic grading system: 3 grade system - Pathologic stage from 06/29/2019: Stage IA (pT1c, pN0(sn), cM0, G2, ER+, PR+, HER2-, Oncotype DX score: 13) - Unsigned Stage prefix: Initial diagnosis Method of lymph node assessment: Sentinel lymph node biopsy Multigene prognostic tests performed: Oncotype DX Recurrence score range: Greater than or equal to 11 Histologic grading system: 3 grade system   SUMMARY OF ONCOLOGIC HISTORY: Oncology History  Malignant neoplasm of upper-outer quadrant of left breast in female, estrogen receptor positive (HCC)  05/20/2019 Initial Diagnosis   Routine screening mammogram detected a 1.4cm left breast mass at the 2:30 position, no axillary adenopathy. 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    06/05/2019 Genetic Testing   Negative genetic testing. No pathogenic variants identified on the [facility] Common Hereditary Cancers Panel + STAT Breast Cancer panel.The STAT Breast cancer panel offered by [facility] includes sequencing and rearrangement analysis for the following 9 genes:  ATM, BRCA1, BRCA2, CDH1, CHEK2, PALB2, PTEN, STK11 and TP53. The Common Hereditary Cancers Panel offered by [facility] includes sequencing and/or deletion duplication testing of the following 47 genes: APC, ATM, AXIN2, BARD1, BMPR1A, BRCA1, BRCA2, BRIP1, CDH1, CDKN2A (p14ARF), CDKN2A (p16INK4a), CKD4, CHEK2, CTNNA1, DICER1, EPCAM (Deletion/duplication testing only), GREM1 (promoter region  deletion/duplication testing only), KIT, MEN1, MLH1, MSH2, MSH3, MSH6, MUTYH, NBN, NF1, NHTL1, PALB2, PDGFRA, PMS2, POLD1, POLE, PTEN, RAD50, RAD51C, RAD51D, SDHB, SDHC, SDHD, SMAD4, SMARCA4. STK11, TP53, TSC1, TSC2, and VHL.  The following genes were evaluated for sequence changes only: SDHA and HOXB13 c.251G>A variant only. The report date is 06/05/2019.    06/14/2019 Surgery   Left lumpectomy Donell Beers) [phone]): IDC, grade 2, with high grade DCIS, 1.7cm, clear margins, 2 left axillary lymph nodes negative for carcinoma.    06/24/2019 Oncotype testing   Recurrence score: 13; low risk, distant recurrence at 9 years: 4%   07/13/2019 - 08/31/2019 Radiation Therapy   The patient initially received a dose of 50.4 Gy in 28 fractions to the breast using whole-breast tangent fields. This was delivered using a 3-D conformal technique. The patient then received a boost to the seroma. This delivered an additional 10 Gy in 5 fractions using a 3-field photon boost technique. The total dose was 60.4 Gy.   09/2019 - 09/2024 Anti-estrogen oral therapy   Tamoxifen     CURRENT THERAPY: Tamoxifen (discontinued)  INTERVAL HISTORY: HELEEN WEINBERG 53 y.o. female returns for follow-up after developing a pulmonary embolus in December 2024.  She tells me that it started as shortness of breath and feeling winded and having pain.  She saw her primary care a few times who ultimately ordered CT chest when she did not improve where the PE was noted.  She has been on Eliquis twice daily with good tolerance.  She denies any easy bruising or bleeding.  She was recommended to stop tamoxifen as this can increase hypercoagulability.  She tells me that her shortness of breath is improved and she is feeling well today.  She has no breast concerns today either.  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   Personal history of radiation therapy     SURGICAL HISTORY: Past Surgical History:  Procedure Laterality Date   BREAST BIOPSY Left 05/16/2019   BREAST LUMPECTOMY Left 06/14/2019   BREAST LUMPECTOMY WITH RADIOACTIVE SEED AND SENTINEL LYMPH NODE BIOPSY Left 06/14/2019   Procedure: LEFT BREAST LUMPECTOMY WITH RADIOACTIVE SEED AND SENTINEL LYMPH NODE BIOPSY;  Surgeon: Almond Lint, MD;  Location: [facility];  Service: General;  Laterality: Left;   CESAREAN SECTION     1997   CHOLECYSTECTOMY     TUBAL LIGATION     1998    SOCIAL HISTORY: Social History   Socioeconomic History   Marital status: Married    Spouse name: Not on file   Number  of children: 1   Years of education: Not on file   Highest education level: 12th grade  Occupational History   Occupation: Merchandiser, retail  Tobacco Use   Smoking status: Never    Passive exposure: Current   Smokeless tobacco: Never  Vaping Use   Vaping status: Never Used  Substance and Sexual Activity   Alcohol use: Yes    Alcohol/week: 2.0 standard drinks of alcohol    Types: 2 Standard drinks or equivalent per week    Comment: occasionally - 2 out of a 7 day period   Drug use: No   Sexual activity: Yes    Birth control/protection: Surgical    Comment: Tubal ligation  Other Topics Concern   Not on file  Social History Narrative   Caffiene 1 cup coffee daily.    Lives home with spouse   Works at [employer]   Education 33yrs   Children one.    Social Drivers of Corporate investment banker Strain: Low Risk  (08/16/2023)   Overall Financial Resource Strain (CARDIA)    Difficulty of Paying Living Expenses: Not very hard  Food Insecurity: No Food Insecurity (08/20/2023)   Hunger Vital Sign    Worried About Running Out of Food in the Last Year: Never true  Ran Out of Food in the Last Year: Never true  Transportation Needs: No Transportation Needs (08/20/2023)   PRAPARE - Administrator, Civil Service (Medical): No    Lack of Transportation (Non-Medical): No  Physical Activity: Insufficiently Active (08/16/2023)   Exercise Vital Sign    Days of Exercise per Week: 3 days    Minutes of Exercise per Session: 30 min  Stress: No Stress Concern Present (08/16/2023)   [employer] of Occupational Health - Occupational Stress Questionnaire    Feeling of Stress : Only a little  Recent Concern: Stress - Stress Concern Present (06/30/2023)   [employer] of Occupational Health - Occupational Stress Questionnaire    Feeling of Stress : To some extent  Social Connections: Unknown (08/16/2023)   Social Connection and Isolation Panel [NHANES]    Frequency of  Communication with Friends and Family: More than three times a week    Frequency of Social Gatherings with Friends and Family: Twice a week    Attends Religious Services: Patient declined    Database administrator or Organizations: No    Attends Engineer, structural: Not on file    Marital Status: Married  Catering manager Violence: Not At Risk (08/20/2023)   Humiliation, Afraid, Rape, and Kick questionnaire    Fear of Current or Ex-Partner: No    Emotionally Abused: No    Physically Abused: No    Sexually Abused: No    FAMILY HISTORY: Family History  Problem Relation Age of Onset   Hypertension Mother    Hypertension Father    Colon cancer Father        dx 87s   Throat cancer Maternal Uncle    Lung cancer Paternal Uncle    Stroke Maternal Grandmother    Hypertension Maternal Grandmother    Aneurysm Maternal Grandmother    Healthy Son     Review of Systems  Constitutional:  Negative for appetite change, chills, fatigue, fever and unexpected weight change.  HENT:   Negative for hearing loss, lump/mass and trouble swallowing.   Eyes:  Negative for eye problems and icterus.  Respiratory:  Negative for chest tightness, cough and shortness of breath.   Cardiovascular:  Negative for chest pain, leg swelling and palpitations.  Gastrointestinal:  Negative for abdominal distention, abdominal pain, constipation, diarrhea, nausea and vomiting.  Endocrine: Negative for hot flashes.  Genitourinary:  Negative for difficulty urinating.   Musculoskeletal:  Negative for arthralgias.  Skin:  Negative for itching and rash.  Neurological:  Negative for dizziness, extremity weakness, headaches and numbness.  Hematological:  Negative for adenopathy. Does not bruise/bleed easily.  Psychiatric/Behavioral:  Negative for depression. The patient is not nervous/anxious.       PHYSICAL EXAMINATION    Vitals:   09/16/23 0910  BP: (!) 114/50  Pulse: 100  Resp: 18  Temp: (!) 97.3 F  (36.3 C)  SpO2: 99%    Physical Exam Constitutional:      General: She is not in acute distress.    Appearance: Normal appearance. She is not toxic-appearing.  HENT:     Head: Normocephalic and atraumatic.     Mouth/Throat:     Mouth: Mucous membranes are moist.     Pharynx: Oropharynx is clear. No oropharyngeal exudate or posterior oropharyngeal erythema.  Eyes:     General: No scleral icterus. Cardiovascular:     Rate and Rhythm: Normal rate and regular rhythm.     Pulses: Normal pulses.  Heart sounds: Normal heart sounds.  Pulmonary:     Effort: Pulmonary effort is normal.     Breath sounds: Normal breath sounds.  Abdominal:     General: Abdomen is flat. Bowel sounds are normal. There is no distension.     Palpations: Abdomen is soft.     Tenderness: There is no abdominal tenderness.  Musculoskeletal:        General: No swelling.     Cervical back: Neck supple.  Lymphadenopathy:     Cervical: No cervical adenopathy.  Skin:    General: Skin is warm and dry.     Findings: No rash.  Neurological:     General: No focal deficit present.     Mental Status: She is alert.  Psychiatric:        Mood and Affect: Mood normal.        Behavior: Behavior normal.        ASSESSMENT and THERAPY PLAN:   Malignant neoplasm of upper-outer quadrant of left breast in female, 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: Routine screening mammogram detected a 1.4cm left breast mass at the 2:30 position, no axillary adenopathy. 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  T1CN0 stage Ia clinical stage   06/14/2019: Left lumpectomy: Grade 2 IDC, 1.7 cm, high-grade DCIS, margins negative, no lymphovascular or perineural invasion, 0/2 lymph nodes negative, ER 100%, PR 100%, HER-2 negative, Ki-67 10% Oncotype DX score: 13, low risk, risk of distant recurrence at 9 years 4% Genetic testing: Negative Adjuvant radiation therapy:  07/14/2019-08/29/2019 Tamoxifen 20mg  x 4 years, stopped in 08/2023 due to pulmonary embolus Bone Density 02/11/22: T score +1.7  PE: On Eliquis with good tolerance. -Discontinue tamoxifen forever -Continue Eliquis for at least 6 months -Her BMI is above 40.  Strongly recommended weight loss as being morbidly obese increases DVT risk as well.  Breast Cancer treatment next steps:  She can no longer take Tamoxifen.  We will obtain labs with FSH, LH, and sensitive estradiol.  I reviewed with her that she will likely not be postmenopausal.  Due to this she will need Zoladex and anastrozole.  I explained to her that Zoladex is given every 4 weeks as an injection to stop her ovaries from making estrogens while the aromatase inhibitor anastrozole blocks of estrogen made from her fat cells and adrenal glands.  We reviewed the risks and benefits of these treatments.  RTC in 1-2 weeks to review lab results and arrange her anti-estrogen therapy plan.           All questions were answered. The patient knows to call the clinic with any problems, questions or concerns. We can certainly see the patient much sooner if necessary.  Total encounter time:30 minutes*in face-to-face visit time, chart review, lab review, care coordination, order entry, and documentation of the encounter time.    Lillard Anes, NP 09/18/23 8:06 AM Medical Oncology and Hematology [facility] [address] [place], [place] [address] Tel. [phone]    Fax. [phone]  *Total Encounter Time as defined by the Centers for Medicare and Medicaid Services includes, in addition to the face-to-face time of a patient visit (documented in the note above) non-face-to-face time: obtaining and reviewing outside history, ordering and reviewing medications, tests or procedures, care coordination (communications with other health care professionals or caregivers) and documentation in the medical record.

## 2023-09-16 NOTE — Patient Instructions (Signed)
 Anastrozole Tablets What is this medication? ANASTROZOLE (an AS troe zole) treats breast cancer. It works by decreasing the amount of estrogen hormone your body makes, which slows or stops breast cancer cells from spreading or growing. This medicine may be used for other purposes; ask your health care provider or pharmacist if you have questions. COMMON BRAND NAME(S): Arimidex What should I tell my care team before I take this medication? They need to know if you have any of these conditions: Bone problems Heart disease High cholesterol An unusual or allergic reaction to anastrozole, other medications, foods, dyes, or preservatives Pregnant or trying to get pregnant Breast-feeding How should I use this medication? Take this medication by mouth with a glass of water . Follow the directions on the prescription label. You can take it with or without food. If it upsets your stomach, take it with food. Take your medication at regular intervals. Do not take it more often than directed. Do not stop taking except on your care team's advice. Talk to your care team about the use of this medication in children. Special care may be needed. Overdosage: If you think you have taken too much of this medicine contact a poison control center or emergency room at once. NOTE: This medicine is only for you. Do not share this medicine with others. What if I miss a dose? If you miss a dose, take it as soon as you can. If it is almost time for your next dose, take only that dose. Do not take double or extra doses. What may interact with this medication? Estrogen and progestin hormones Tamoxifen  This list may not describe all possible interactions. Give your health care provider a list of all the medicines, herbs, non-prescription drugs, or dietary supplements you use. Also tell them if you smoke, drink alcohol, or use illegal drugs. Some items may interact with your medicine. What should I watch for while using this  medication? Visit your care team for regular checks on your progress. Let your care team know about any unusual vaginal bleeding. Using this medication for a long time may weaken your bones. The risk of bone fractures may be increased. Talk to your care team about your bone health. You should make sure that you get enough calcium and vitamin D  while you are taking this medication. Discuss the foods you eat and the vitamins you take with your care team. Talk to your care team if you may be pregnant. Serious birth defects can occur if you take this medication during pregnancy and for 3 weeks after the last dose. You will need a negative pregnancy test before starting this medication. Estrogen and progestin hormones may not work as well while you are taking this medication. Contraception is recommended while taking this medication and for 3 weeks after the last dose. Your care team can help you find the option that works for you. Do not breastfeed while taking this medication and for 2 weeks after the last dose. This medication may cause infertility. Talk with your care team if you are concerned about your fertility. What side effects may I notice from receiving this medication? Side effects that you should report to your care team as soon as possible: Allergic reactions--skin rash, itching, hives, swelling of the face, lips, tongue, or throat Side effects that usually do not require medical attention (report to your care team if they continue or are bothersome): Bone, joint, or muscle pain Headache Hot flashes Nausea Sore throat Swelling of the ankles,  hands, or feet Unusual weakness or fatigue This list may not describe all possible side effects. Call your doctor for medical advice about side effects. You may report side effects to FDA at 1-800-FDA-1088. Where should I keep my medication? Keep out of the reach of children and pets. Store at room temperature between 20 and 25 degrees C (68 and 77  degrees F). Get rid of any unused medication after the expiration date. To get rid of medications that are no longer wanted or have expired: Take the medication to a medication take-back program. Check with your pharmacy or law enforcement to find a location. If you cannot return the medication, check the label or package insert to see if the medication should be thrown out in the garbage or flushed down the toilet. If you are not sure, ask your care team. If it is safe to put it in the trash, empty the medication out of the container. Mix the medication with cat litter, dirt, coffee grounds, or other unwanted substance. Seal the mixture in a bag or container. Put it in the trash. NOTE: This sheet is a summary. It may not cover all possible information. If you have questions about this medicine, talk to your doctor, pharmacist, or health care provider.  2024 Elsevier/Gold Standard (2022-01-03 00:00:00)Goserelin Implant What is this medication? GOSERELIN (GOE se rel in) treats prostate cancer and breast cancer. It works by decreasing levels of the hormones testosterone  and estrogen in the body. This prevents prostate and breast cancer cells from spreading or growing. It may also be used to treat endometriosis. This is a condition where the tissue that lines the uterus grows outside the uterus. It works by decreasing the amount of estrogen your body makes, which reduces heavy bleeding and pain. It can also be used to help thin the lining of the uterus before a surgery used to prevent or reduce heavy periods. This medicine may be used for other purposes; ask your health care provider or pharmacist if you have questions. COMMON BRAND NAME(S): Zoladex, Zoladex 72-Month What should I tell my care team before I take this medication? They need to know if you have any of these conditions: Bone problems Diabetes Heart disease History of irregular heartbeat or rhythm An unusual or allergic reaction to  goserelin, other medications, foods, dyes, or preservatives Pregnant or trying to get pregnant Breastfeeding How should I use this medication? This medication is injected under the skin. It is given by your care team in a hospital or clinic setting. Talk to your care team about the use of this medication in children. Special care may be needed. Overdosage: If you think you have taken too much of this medicine contact a poison control center or emergency room at once. NOTE: This medicine is only for you. Do not share this medicine with others. What if I miss a dose? Keep appointments for follow-up doses. It is important not to miss your dose. Call your care team if you are unable to keep an appointment. What may interact with this medication? Do not take this medication with any of the following: Cisapride Dronedarone Pimozide Thioridazine This medication may also interact with the following: Other medications that cause heart rhythm changes This list may not describe all possible interactions. Give your health care provider a list of all the medicines, herbs, non-prescription drugs, or dietary supplements you use. Also tell them if you smoke, drink alcohol, or use illegal drugs. Some items may interact with your medicine. What  should I watch for while using this medication? Visit your care team for regular checks on your progress. Your symptoms may appear to get worse during the first weeks of this therapy. Tell your care team if your symptoms do not start to get better or if they get worse after this time. Using this medication for a long time may weaken your bones. If you smoke or frequently drink alcohol you may increase your risk of bone loss. A family history of osteoporosis, chronic use of medications for seizures (convulsions), or corticosteroids can also increase your risk of bone loss. The risk of bone fractures may be increased. Talk to your care team about your bone health. This  medication may increase blood sugar. The risk may be higher in patients who already have diabetes. Ask your care team what you can do to lower your risk of diabetes while taking this medication. This medication should stop regular monthly menstruation in women. Tell your care team if you continue to menstruate. Talk to your care team if you wish to become pregnant or think you might be pregnant. This medication can cause serious birth defects if taken during pregnancy or for 12 weeks after stopping treatment. Talk to your care team about reliable forms of contraception. Do not breastfeed while taking this medication. This medication may cause infertility. Talk to your care team if you are concerned about your fertility. What side effects may I notice from receiving this medication? Side effects that you should report to your care team as soon as possible: Allergic reactions--skin rash, itching, hives, swelling of the face, lips, tongue, or throat Change in the amount of urine Heart attack--pain or tightness in the chest, shoulders, arms, or jaw, nausea, shortness of breath, cold or clammy skin, feeling faint or lightheaded Heart rhythm changes--fast or irregular heartbeat, dizziness, feeling faint or lightheaded, chest pain, trouble breathing High blood sugar (hyperglycemia)--increased thirst or amount of urine, unusual weakness or fatigue, blurry vision High calcium level--increased thirst or amount of urine, nausea, vomiting, confusion, unusual weakness or fatigue, bone pain Pain, redness, irritation, or bruising at the injection site Severe back pain, numbness or weakness of the hands, arms, legs, or feet, loss of coordination, loss of bowel or bladder control Stroke--sudden numbness or weakness of the face, arm, or leg, trouble speaking, confusion, trouble walking, loss of balance or coordination, dizziness, severe headache, change in vision Swelling and pain of the tumor site or lymph  nodes Trouble passing urine Side effects that usually do not require medical attention (report to your care team if they continue or are bothersome): Change in sex drive or performance Headache Hot flashes Rapid or extreme change in emotion or mood Sweating Swelling of the ankles, hands, or feet Unusual vaginal discharge, itching, or odor This list may not describe all possible side effects. Call your doctor for medical advice about side effects. You may report side effects to FDA at 1-800-FDA-1088. Where should I keep my medication? This medication is given in a hospital or clinic. It will not be stored at home. NOTE: This sheet is a summary. It may not cover all possible information. If you have questions about this medicine, talk to your doctor, pharmacist, or health care provider.  2024 Elsevier/Gold Standard (2022-01-09 00:00:00)

## 2023-09-17 ENCOUNTER — Other Ambulatory Visit (HOSPITAL_COMMUNITY): Payer: Self-pay

## 2023-09-17 LAB — FOLLICLE STIMULATING HORMONE: FSH: 26.4 m[IU]/mL

## 2023-09-17 LAB — LUTEINIZING HORMONE: LH: 8.5 m[IU]/mL

## 2023-09-18 ENCOUNTER — Other Ambulatory Visit (HOSPITAL_COMMUNITY): Payer: Self-pay

## 2023-09-18 NOTE — Assessment & Plan Note (Signed)
05/20/2019: Routine screening mammogram detected a 1.4cm left breast mass at the 2:30 position, no axillary adenopathy. 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  T1CN0 stage Ia clinical stage   06/14/2019: Left lumpectomy: Grade 2 IDC, 1.7 cm, high-grade DCIS, margins negative, no lymphovascular or perineural invasion, 0/2 lymph nodes negative, ER 100%, PR 100%, HER-2 negative, Ki-67 10% Oncotype DX score: 13, low risk, risk of distant recurrence at 9 years 4% Genetic testing: Negative Adjuvant radiation therapy: 07/14/2019-08/29/2019 Tamoxifen 20mg  x 4 years, stopped in 08/2023 due to pulmonary embolus Bone Density 02/11/22: T score +1.7  PE: On Eliquis with good tolerance. -Discontinue tamoxifen forever -Continue Eliquis for at least 6 months -Her BMI is above 40.  Strongly recommended weight loss as being morbidly obese increases DVT risk as well.  Breast Cancer treatment next steps:  She can no longer take Tamoxifen.  We will obtain labs with FSH, LH, and sensitive estradiol.  I reviewed with her that she will likely not be postmenopausal.  Due to this she will need Zoladex and anastrozole.  I explained to her that Zoladex is given every 4 weeks as an injection to stop her ovaries from making estrogens while the aromatase inhibitor anastrozole blocks of estrogen made from her fat cells and adrenal glands.  We reviewed the risks and benefits of these treatments.  RTC in 1-2 weeks to review lab results and arrange her anti-estrogen therapy plan.

## 2023-09-19 LAB — ESTRADIOL, ULTRA SENS: Estradiol, Sensitive: 7.7 pg/mL

## 2023-09-21 ENCOUNTER — Other Ambulatory Visit (HOSPITAL_COMMUNITY): Payer: Self-pay

## 2023-09-22 ENCOUNTER — Encounter: Payer: Self-pay | Admitting: Adult Health

## 2023-09-24 ENCOUNTER — Encounter (HOSPITAL_BASED_OUTPATIENT_CLINIC_OR_DEPARTMENT_OTHER): Payer: Self-pay | Admitting: Orthopedic Surgery

## 2023-09-24 ENCOUNTER — Other Ambulatory Visit: Payer: Self-pay

## 2023-09-24 NOTE — Progress Notes (Signed)
Call Dr. Laverta [place] office to get medical clearance and eliquis hold time. Recent dx of PE in 08/2023.

## 2023-09-28 NOTE — Progress Notes (Signed)
LVM with Megan at Dr. Laverta Baltimore office about clearance and eliquis orders.

## 2023-09-28 NOTE — Progress Notes (Signed)

## 2023-09-30 ENCOUNTER — Inpatient Hospital Stay (HOSPITAL_BASED_OUTPATIENT_CLINIC_OR_DEPARTMENT_OTHER): Payer: 59 | Admitting: Adult Health

## 2023-09-30 ENCOUNTER — Encounter: Payer: Self-pay | Admitting: Adult Health

## 2023-09-30 ENCOUNTER — Other Ambulatory Visit (HOSPITAL_COMMUNITY): Payer: Self-pay

## 2023-09-30 DIAGNOSIS — C50412 Malignant neoplasm of upper-outer quadrant of left female breast: Secondary | ICD-10-CM | POA: Diagnosis not present

## 2023-09-30 DIAGNOSIS — Z170 Estrogen receptor positive status [ER+]: Secondary | ICD-10-CM

## 2023-09-30 DIAGNOSIS — Z79811 Long term (current) use of aromatase inhibitors: Secondary | ICD-10-CM | POA: Diagnosis not present

## 2023-09-30 MED ORDER — ANASTROZOLE 1 MG PO TABS
1.0000 mg | ORAL_TABLET | Freq: Every day | ORAL | 3 refills | Status: DC
  Filled 2023-09-30: qty 90, 90d supply, fill #0
  Filled 2023-12-24: qty 90, 90d supply, fill #1
  Filled 2024-05-25: qty 90, 90d supply, fill #2
  Filled 2024-07-22 – 2024-08-03 (×5): qty 90, 90d supply, fill #3

## 2023-09-30 NOTE — Assessment & Plan Note (Signed)
05/20/2019: Routine screening mammogram detected a 1.4cm left breast mass at the 2:30 position, no axillary adenopathy. 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  T1CN0 stage Ia clinical stage   06/14/2019: Left lumpectomy: Grade 2 IDC, 1.7 cm, high-grade DCIS, margins negative, no lymphovascular or perineural invasion, 0/2 lymph nodes negative, ER 100%, PR 100%, HER-2 negative, Ki-67 10% Oncotype DX score: 13, low risk, risk of distant recurrence at 9 years 4% Genetic testing: Negative Adjuvant radiation therapy: 07/14/2019-08/29/2019 Tamoxifen 20mg  x 4 years, stopped in 08/2023 due to pulmonary embolus Bone Density 02/11/22: T score +1.7  PE: On Eliquis with good tolerance. -Discontinue tamoxifen forever -Continue Eliquis for at least 6 months -Her BMI is above 40.  Strongly recommended weight loss as being morbidly obese increases DVT risk as well.  Breast Cancer treatment:  FSH, LH, and estradiol indicate post-menopause.   Anastrozole daily to begin in 10/2023--reviewed risks and benefits in detail.   Bone density testing ordered.  RTC in 6 months for f/u.

## 2023-09-30 NOTE — Progress Notes (Signed)
[facility] Cancer Follow up:    Georgina Quint, MD [address] [place] [address]   DIAGNOSIS:  Cancer Staging  Malignant neoplasm of upper-outer quadrant of left breast in female, estrogen receptor positive (HCC) Staging form: Breast, AJCC 8th Edition - Clinical stage from 05/25/2019: Stage IA (cT1c, cN0, cM0, G1, ER+, PR+, HER2-) - Unsigned Stage prefix: Initial diagnosis Method of lymph node assessment: Clinical Histologic grading system: 3 grade system - Pathologic stage from 06/29/2019: Stage IA (pT1c, pN0(sn), cM0, G2, ER+, PR+, HER2-, Oncotype DX score: 13) - Unsigned Stage prefix: Initial diagnosis Method of lymph node assessment: Sentinel lymph node biopsy Multigene prognostic tests performed: Oncotype DX Recurrence score range: Greater than or equal to 11 Histologic grading system: 3 grade system  I connected with Bryahna Lesko Advani  on 09/30/23 at  9:00 AM EST by telephone and verified that I am speaking with the correct person using two identifiers.  I discussed the limitations, risks, security and privacy concerns of performing an evaluation and management service by telephone and the availability of in person appointments.  I also discussed with the patient that there may be a patient responsible charge related to this service. The patient expressed understanding and agreed to proceed.   Patient location: home Provider location:  [facility] provider office SUMMARY OF ONCOLOGIC HISTORY: Oncology History  Malignant neoplasm of upper-outer quadrant of left breast in female, estrogen receptor positive (HCC)  05/20/2019 Initial Diagnosis   Routine screening mammogram detected a 1.4cm left breast mass at the 2:30 position, no axillary adenopathy. 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    06/05/2019 Genetic Testing   Negative genetic testing. No pathogenic variants identified on the [facility] Common Hereditary  Cancers Panel + STAT Breast Cancer panel.The STAT Breast cancer panel offered by [facility] includes sequencing and rearrangement analysis for the following 9 genes:  ATM, BRCA1, BRCA2, CDH1, CHEK2, PALB2, PTEN, STK11 and TP53. The Common Hereditary Cancers Panel offered by [facility] includes sequencing and/or deletion duplication testing of the following 47 genes: APC, ATM, AXIN2, BARD1, BMPR1A, BRCA1, BRCA2, BRIP1, CDH1, CDKN2A (p14ARF), CDKN2A (p16INK4a), CKD4, CHEK2, CTNNA1, DICER1, EPCAM (Deletion/duplication testing only), GREM1 (promoter region deletion/duplication testing only), KIT, MEN1, MLH1, MSH2, MSH3, MSH6, MUTYH, NBN, NF1, NHTL1, PALB2, PDGFRA, PMS2, POLD1, POLE, PTEN, RAD50, RAD51C, RAD51D, SDHB, SDHC, SDHD, SMAD4, SMARCA4. STK11, TP53, TSC1, TSC2, and VHL.  The following genes were evaluated for sequence changes only: SDHA and HOXB13 c.251G>A variant only. The report date is 06/05/2019.    06/14/2019 Surgery   Left lumpectomy Donell Beers) [phone]): IDC, grade 2, with high grade DCIS, 1.7cm, clear margins, 2 left axillary lymph nodes negative for carcinoma.    06/24/2019 Oncotype testing   Recurrence score: 13; low risk, distant recurrence at 9 years: 4%   07/13/2019 - 08/31/2019 Radiation Therapy   The patient initially received a dose of 50.4 Gy in 28 fractions to the breast using whole-breast tangent fields. This was delivered using a 3-D conformal technique. The patient then received a boost to the seroma. This delivered an additional 10 Gy in 5 fractions using a 3-field photon boost technique. The total dose was 60.4 Gy.   09/2019 - 08/2023 Anti-estrogen oral therapy   Tamoxifen--stopped early due to pulmonary embolus   10/2023 -  Anti-estrogen oral therapy   Anastrozole daily     CURRENT THERAPY: Tamoxifen discontinued  INTERVAL HISTORY: Keyra Virella Kruser 53 y.o. female returns  for follow-up after undergoing lab testing to determine her menopausal status.  She recently had FSH LH  and estradiol drawn all which indicated postmenopausal status.  She was previously on tamoxifen but stopped in December 2024 when she developed a pulmonary embolus.  She has surgery upcoming on her foot in 2 days and has been holding her Eliquis as directed by her surgical team.   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   Personal history of radiation therapy    Sleep apnea     SURGICAL HISTORY: Past Surgical History:  Procedure Laterality Date   BREAST BIOPSY Left 05/16/2019   BREAST LUMPECTOMY Left 06/14/2019   BREAST LUMPECTOMY  WITH RADIOACTIVE SEED AND SENTINEL LYMPH NODE BIOPSY Left 06/14/2019   Procedure: LEFT BREAST LUMPECTOMY WITH RADIOACTIVE SEED AND SENTINEL LYMPH NODE BIOPSY;  Surgeon: Almond Lint, MD;  Location: [facility];  Service: General;  Laterality: Left;   CESAREAN SECTION     1997   CHOLECYSTECTOMY     TUBAL LIGATION     1998    SOCIAL HISTORY: Social History   Socioeconomic History   Marital status: Married    Spouse name: Not on file   Number of children: 1   Years of education: Not on file   Highest education level: 12th grade  Occupational History   Occupation: Merchandiser, retail  Tobacco Use   Smoking status: Never    Passive exposure: Current   Smokeless tobacco: Never  Vaping Use   Vaping status: Never Used  Substance and Sexual Activity   Alcohol use: Yes    Alcohol/week: 2.0 standard drinks of alcohol    Types: 2 Standard drinks or equivalent per week    Comment: occasionally - 2 out of a 7 day period   Drug use: No   Sexual activity: Yes    Birth control/protection: Surgical    Comment: Tubal ligation  Other Topics Concern   Not on file  Social History Narrative   Caffiene 1 cup coffee daily.    Lives home with spouse   Works at [employer]  Education 80yrs   Children one.    Social Drivers of Corporate investment banker Strain: Low Risk  (08/16/2023)   Overall Financial Resource Strain (CARDIA)    Difficulty of Paying Living Expenses: Not very hard  Food Insecurity: No Food Insecurity (08/20/2023)   Hunger Vital Sign    Worried About Running Out of Food in the Last Year: Never true    Ran Out of Food in the Last Year: Never true  Transportation Needs: No Transportation Needs (08/20/2023)   PRAPARE - Administrator, Civil Service (Medical): No    Lack of Transportation (Non-Medical): No  Physical Activity: Insufficiently Active (08/16/2023)   Exercise Vital Sign    Days of Exercise per Week: 3 days    Minutes of Exercise per Session:  30 min  Stress: No Stress Concern Present (08/16/2023)   [employer] of Occupational Health - Occupational Stress Questionnaire    Feeling of Stress : Only a little  Recent Concern: Stress - Stress Concern Present (06/30/2023)   [employer] of Occupational Health - Occupational Stress Questionnaire    Feeling of Stress : To some extent  Social Connections: Unknown (08/16/2023)   Social Connection and Isolation Panel [NHANES]    Frequency of Communication with Friends and Family: More than three times a week    Frequency of Social Gatherings with Friends and Family: Twice a week    Attends Religious Services: Patient declined    Database administrator or Organizations: No    Attends Engineer, structural: Not on file    Marital Status: Married  Catering manager Violence: Not At Risk (08/20/2023)   Humiliation, Afraid, Rape, and Kick questionnaire    Fear of Current or Ex-Partner: No    Emotionally Abused: No    Physically Abused: No    Sexually Abused: No    FAMILY HISTORY: Family History  Problem Relation Age of Onset   Hypertension Mother    Hypertension Father    Colon cancer Father        dx 61s   Throat cancer Maternal Uncle    Lung cancer Paternal Uncle    Stroke Maternal Grandmother    Hypertension Maternal Grandmother    Aneurysm Maternal Grandmother    Healthy Son     Review of Systems  Constitutional:  Negative for appetite change, chills, fatigue, fever and unexpected weight change.  HENT:   Negative for hearing loss, lump/mass and trouble swallowing.   Eyes:  Negative for eye problems and icterus.  Respiratory:  Negative for chest tightness, cough and shortness of breath.   Cardiovascular:  Negative for chest pain, leg swelling and palpitations.  Gastrointestinal:  Negative for abdominal distention, abdominal pain, constipation, diarrhea, nausea and vomiting.  Endocrine: Negative for hot flashes.  Genitourinary:  Negative for difficulty  urinating.   Musculoskeletal:  Negative for arthralgias.  Skin:  Negative for itching and rash.  Neurological:  Negative for dizziness, extremity weakness, headaches and numbness.  Hematological:  Negative for adenopathy. Does not bruise/bleed easily.  Psychiatric/Behavioral:  Negative for depression. The patient is not nervous/anxious.       PHYSICAL EXAMINATION Patient sounds well she is in no apparent distress.  Mood and behavior are normal.  Speech is normal.      ASSESSMENT and THERAPY PLAN:   Malignant neoplasm of upper-outer quadrant of left breast in female, 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: Routine screening mammogram detected a 1.4cm left breast mass at the 2:30 position,  no axillary adenopathy. 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  T1CN0 stage Ia clinical stage   06/14/2019: Left lumpectomy: Grade 2 IDC, 1.7 cm, high-grade DCIS, margins negative, no lymphovascular or perineural invasion, 0/2 lymph nodes negative, ER 100%, PR 100%, HER-2 negative, Ki-67 10% Oncotype DX score: 13, low risk, risk of distant recurrence at 9 years 4% Genetic testing: Negative Adjuvant radiation therapy: 07/14/2019-08/29/2019 Tamoxifen 20mg  x 4 years, stopped in 08/2023 due to pulmonary embolus Bone Density 02/11/22: T score +1.7  PE: On Eliquis with good tolerance. -Discontinue tamoxifen forever -Continue Eliquis for at least 6 months -Her BMI is above 40.  Strongly recommended weight loss as being morbidly obese increases DVT risk as well.  Breast Cancer treatment:  FSH, LH, and estradiol indicate post-menopause.   Anastrozole daily to begin in 10/2023--reviewed risks and benefits in detail.   Bone density testing ordered.  RTC in 6 months for f/u.         All questions were answered. The patient knows to call the clinic with any problems, questions or concerns. We can certainly see the patient much sooner if necessary.  Follow  up instructions:    -Return to cancer center 6 months  The patient was provided an opportunity to ask questions and all were answered. The patient agreed with the plan and demonstrated an understanding of the instructions.   The patient was advised to call back or seek an in-person evaluation if the symptoms worsen or if the condition fails to improve as anticipated.   I provided 10 minutes of non face-to-face telephone visit time during this encounter, and > 50% was spent counseling as documented under my assessment & plan.  Lillard Anes, NP 09/30/23 9:38 AM Medical Oncology and Hematology [facility] [address] [place], [place] [address] Tel. [phone]    Fax. [phone]

## 2023-10-01 ENCOUNTER — Ambulatory Visit (HOSPITAL_BASED_OUTPATIENT_CLINIC_OR_DEPARTMENT_OTHER): Payer: 59 | Admitting: Anesthesiology

## 2023-10-01 ENCOUNTER — Encounter (HOSPITAL_BASED_OUTPATIENT_CLINIC_OR_DEPARTMENT_OTHER): Payer: Self-pay | Admitting: Orthopedic Surgery

## 2023-10-01 ENCOUNTER — Other Ambulatory Visit: Payer: Self-pay

## 2023-10-01 ENCOUNTER — Ambulatory Visit (HOSPITAL_BASED_OUTPATIENT_CLINIC_OR_DEPARTMENT_OTHER): Payer: 59

## 2023-10-01 ENCOUNTER — Ambulatory Visit (HOSPITAL_BASED_OUTPATIENT_CLINIC_OR_DEPARTMENT_OTHER)
Admission: RE | Admit: 2023-10-01 | Discharge: 2023-10-01 | Disposition: A | Discharge status: Home / Self Care | Payer: 59 | Source: Ambulatory Visit | Attending: Orthopedic Surgery | Admitting: Orthopedic Surgery

## 2023-10-01 ENCOUNTER — Encounter (HOSPITAL_BASED_OUTPATIENT_CLINIC_OR_DEPARTMENT_OTHER)
Admission: RE | Disposition: A | Discharge status: Home / Self Care | Payer: Self-pay | Source: Ambulatory Visit | Attending: Orthopedic Surgery

## 2023-10-01 ENCOUNTER — Other Ambulatory Visit (HOSPITAL_COMMUNITY): Payer: Self-pay

## 2023-10-01 DIAGNOSIS — M76811 Anterior tibial syndrome, right leg: Secondary | ICD-10-CM | POA: Diagnosis not present

## 2023-10-01 DIAGNOSIS — I10 Essential (primary) hypertension: Secondary | ICD-10-CM | POA: Diagnosis not present

## 2023-10-01 DIAGNOSIS — G4730 Sleep apnea, unspecified: Secondary | ICD-10-CM | POA: Insufficient documentation

## 2023-10-01 DIAGNOSIS — M67873 Other specified disorders of tendon, right ankle and foot: Secondary | ICD-10-CM | POA: Diagnosis not present

## 2023-10-01 DIAGNOSIS — Z6841 Body Mass Index (BMI) 40.0 and over, adult: Secondary | ICD-10-CM | POA: Diagnosis not present

## 2023-10-01 DIAGNOSIS — Z7901 Long term (current) use of anticoagulants: Secondary | ICD-10-CM | POA: Insufficient documentation

## 2023-10-01 DIAGNOSIS — E66813 Obesity, class 3: Secondary | ICD-10-CM | POA: Insufficient documentation

## 2023-10-01 DIAGNOSIS — Z7722 Contact with and (suspected) exposure to environmental tobacco smoke (acute) (chronic): Secondary | ICD-10-CM | POA: Diagnosis not present

## 2023-10-01 DIAGNOSIS — Z79899 Other long term (current) drug therapy: Secondary | ICD-10-CM | POA: Insufficient documentation

## 2023-10-01 DIAGNOSIS — M76821 Posterior tibial tendinitis, right leg: Secondary | ICD-10-CM | POA: Diagnosis not present

## 2023-10-01 DIAGNOSIS — M62461 Contracture of muscle, right lower leg: Secondary | ICD-10-CM | POA: Diagnosis not present

## 2023-10-01 DIAGNOSIS — G4733 Obstructive sleep apnea (adult) (pediatric): Secondary | ICD-10-CM | POA: Diagnosis not present

## 2023-10-01 DIAGNOSIS — M6701 Short Achilles tendon (acquired), right ankle: Secondary | ICD-10-CM | POA: Diagnosis not present

## 2023-10-01 DIAGNOSIS — Z01818 Encounter for other preprocedural examination: Secondary | ICD-10-CM

## 2023-10-01 DIAGNOSIS — G8918 Other acute postprocedural pain: Secondary | ICD-10-CM | POA: Diagnosis not present

## 2023-10-01 HISTORY — DX: Sleep apnea, unspecified: G47.30

## 2023-10-01 HISTORY — PX: CALCANEAL OSTEOTOMY: SHX1281

## 2023-10-01 HISTORY — PX: GASTROCNEMIUS RECESSION: SHX863

## 2023-10-01 HISTORY — PX: POSTERIOR TIBIAL TENDON REPAIR: SHX6039

## 2023-10-01 SURGERY — RECESSION, MUSCLE, GASTROCNEMIUS
Anesthesia: General | Site: Leg Lower | Laterality: Right

## 2023-10-01 MED ORDER — OXYCODONE HCL 5 MG PO TABS
5.0000 mg | ORAL_TABLET | Freq: Once | ORAL | Status: AC | PRN
  Administered 2023-10-01: 5 mg via ORAL

## 2023-10-01 MED ORDER — LIDOCAINE 2% (20 MG/ML) 5 ML SYRINGE
INTRAMUSCULAR | Status: DC | PRN
  Administered 2023-10-01: 20 mg via INTRAVENOUS

## 2023-10-01 MED ORDER — FENTANYL CITRATE (PF) 100 MCG/2ML IJ SOLN
INTRAMUSCULAR | Status: AC
  Filled 2023-10-01: qty 2

## 2023-10-01 MED ORDER — BUPIVACAINE-EPINEPHRINE (PF) 0.5% -1:200000 IJ SOLN
INTRAMUSCULAR | Status: DC | PRN
  Administered 2023-10-01: 25 mL via PERINEURAL
  Administered 2023-10-01: 15 mL via PERINEURAL

## 2023-10-01 MED ORDER — FENTANYL CITRATE (PF) 100 MCG/2ML IJ SOLN
INTRAMUSCULAR | Status: DC | PRN
  Administered 2023-10-01 (×2): 25 ug via INTRAVENOUS

## 2023-10-01 MED ORDER — MIDAZOLAM HCL 2 MG/2ML IJ SOLN
2.0000 mg | Freq: Once | INTRAMUSCULAR | Status: AC
  Administered 2023-10-01: 2 mg via INTRAVENOUS

## 2023-10-01 MED ORDER — VANCOMYCIN HCL 500 MG IV SOLR
INTRAVENOUS | Status: DC | PRN
  Administered 2023-10-01: 500 mg via TOPICAL

## 2023-10-01 MED ORDER — ONDANSETRON HCL 4 MG/2ML IJ SOLN
INTRAMUSCULAR | Status: AC
Start: 2023-10-01 — End: ?
  Filled 2023-10-01: qty 2

## 2023-10-01 MED ORDER — OXYCODONE HCL 5 MG/5ML PO SOLN: 5.0000 mg | Freq: Once | ORAL | Status: AC | PRN

## 2023-10-01 MED ORDER — PROPOFOL 10 MG/ML IV BOLUS
INTRAVENOUS | Status: DC | PRN
  Administered 2023-10-01: 200 mg via INTRAVENOUS
  Administered 2023-10-01: 100 mg via INTRAVENOUS

## 2023-10-01 MED ORDER — DEXAMETHASONE SODIUM PHOSPHATE 10 MG/ML IJ SOLN
INTRAMUSCULAR | Status: DC | PRN
  Administered 2023-10-01: 10 mg via INTRAVENOUS

## 2023-10-01 MED ORDER — SODIUM CHLORIDE 0.9 % IV SOLN: 12.5000 mg | INTRAVENOUS | Status: DC | PRN

## 2023-10-01 MED ORDER — DEXAMETHASONE SODIUM PHOSPHATE 10 MG/ML IJ SOLN
INTRAMUSCULAR | Status: AC
  Filled 2023-10-01: qty 1

## 2023-10-01 MED ORDER — 0.9 % SODIUM CHLORIDE (POUR BTL) OPTIME
TOPICAL | Status: DC | PRN
  Administered 2023-10-01: 300 mL

## 2023-10-01 MED ORDER — OXYCODONE HCL 5 MG PO TABS
ORAL_TABLET | ORAL | Status: AC
  Filled 2023-10-01: qty 1

## 2023-10-01 MED ORDER — MIDAZOLAM HCL 2 MG/2ML IJ SOLN
INTRAMUSCULAR | Status: AC
  Filled 2023-10-01: qty 2

## 2023-10-01 MED ORDER — AMISULPRIDE (ANTIEMETIC) 5 MG/2ML IV SOLN: 10.0000 mg | Freq: Once | INTRAVENOUS | Status: DC | PRN

## 2023-10-01 MED ORDER — ACETAMINOPHEN 500 MG PO TABS: 1000.0000 mg | ORAL_TABLET | Freq: Once | ORAL | Status: DC

## 2023-10-01 MED ORDER — CEFAZOLIN SODIUM-DEXTROSE 2-4 GM/100ML-% IV SOLN
INTRAVENOUS | Status: AC
Start: 2023-10-01 — End: ?
  Filled 2023-10-01: qty 100

## 2023-10-01 MED ORDER — OXYCODONE HCL 5 MG PO TABS
5.0000 mg | ORAL_TABLET | Freq: Four times a day (QID) | ORAL | 0 refills | Status: AC | PRN
  Filled 2023-10-01: qty 20, 5d supply, fill #0

## 2023-10-01 MED ORDER — PROPOFOL 10 MG/ML IV BOLUS
INTRAVENOUS | Status: AC
  Filled 2023-10-01: qty 20

## 2023-10-01 MED ORDER — SENNA 8.6 MG PO TABS
2.0000 | ORAL_TABLET | Freq: Two times a day (BID) | ORAL | 0 refills | Status: DC
  Filled 2023-10-01: qty 30, 8d supply, fill #0

## 2023-10-01 MED ORDER — DOCUSATE SODIUM 100 MG PO CAPS
100.0000 mg | ORAL_CAPSULE | Freq: Two times a day (BID) | ORAL | 0 refills | Status: DC
  Filled 2023-10-01: qty 30, 15d supply, fill #0

## 2023-10-01 MED ORDER — FENTANYL CITRATE (PF) 100 MCG/2ML IJ SOLN
50.0000 ug | Freq: Once | INTRAMUSCULAR | Status: AC
  Administered 2023-10-01: 50 ug via INTRAVENOUS

## 2023-10-01 MED ORDER — LIDOCAINE 2% (20 MG/ML) 5 ML SYRINGE
INTRAMUSCULAR | Status: AC
  Filled 2023-10-01: qty 5

## 2023-10-01 MED ORDER — LACTATED RINGERS IV SOLN: INTRAVENOUS | Status: DC

## 2023-10-01 MED ORDER — FENTANYL CITRATE (PF) 100 MCG/2ML IJ SOLN: 25.0000 ug | INTRAMUSCULAR | Status: DC | PRN

## 2023-10-01 MED ORDER — CEFAZOLIN SODIUM-DEXTROSE 2-4 GM/100ML-% IV SOLN
2.0000 g | INTRAVENOUS | Status: AC
  Administered 2023-10-01: 2 g via INTRAVENOUS

## 2023-10-01 MED ORDER — MIDAZOLAM HCL 5 MG/5ML IJ SOLN
INTRAMUSCULAR | Status: DC | PRN
  Administered 2023-10-01: 2 mg via INTRAVENOUS

## 2023-10-01 MED ORDER — ONDANSETRON HCL 4 MG/2ML IJ SOLN
INTRAMUSCULAR | Status: DC | PRN
  Administered 2023-10-01: 4 mg via INTRAVENOUS

## 2023-10-01 SURGICAL SUPPLY — 86 items
BANDAGE ESMARK 6X9 LF (GAUZE/BANDAGES/DRESSINGS) IMPLANT
BIT DRILL 2.4 AO COUPLING CANN (BIT) IMPLANT
BLADE AVERAGE 25X9 (BLADE) IMPLANT
BLADE MICRO SAGITTAL (BLADE) ×4 IMPLANT
BLADE SURG 15 STRL LF DISP TIS (BLADE) ×8 IMPLANT
BNDG ELASTIC 4INX 5YD STR LF (GAUZE/BANDAGES/DRESSINGS) ×4 IMPLANT
BNDG ELASTIC 6INX 5YD STR LF (GAUZE/BANDAGES/DRESSINGS) ×4 IMPLANT
BNDG ESMARK 4X9 LF (GAUZE/BANDAGES/DRESSINGS) IMPLANT
BNDG ESMARK 6X9 LF (GAUZE/BANDAGES/DRESSINGS)
BOOT STEPPER DURA LG (SOFTGOODS) IMPLANT
BOOT STEPPER DURA MED (SOFTGOODS) IMPLANT
BOOT STEPPER DURA XLG (SOFTGOODS) IMPLANT
CANISTER SUCT 1200ML W/VALVE (MISCELLANEOUS) ×4 IMPLANT
CHLORAPREP W/TINT 26 (MISCELLANEOUS) ×4 IMPLANT
COVER BACK TABLE 60X90IN (DRAPES) ×4 IMPLANT
CUFF TRNQT CYL 34X4.125X (TOURNIQUET CUFF) ×4 IMPLANT
DRAPE EXTREMITY T 121X128X90 (DISPOSABLE) ×4 IMPLANT
DRAPE OEC MINIVIEW 54X84 (DRAPES) ×4 IMPLANT
DRAPE U-SHAPE 47X51 STRL (DRAPES) ×4 IMPLANT
DRSG MEPITEL 4X7.2 (GAUZE/BANDAGES/DRESSINGS) ×4 IMPLANT
ELECT REM PT RETURN 9FT ADLT (ELECTROSURGICAL) ×3
ELECTRODE REM PT RTRN 9FT ADLT (ELECTROSURGICAL) ×4 IMPLANT
GAUZE PAD ABD 8X10 STRL (GAUZE/BANDAGES/DRESSINGS) ×8 IMPLANT
GAUZE SPONGE 4X4 12PLY STRL (GAUZE/BANDAGES/DRESSINGS) ×4 IMPLANT
GLOVE BIO SURGEON STRL SZ8 (GLOVE) ×4 IMPLANT
GLOVE BIOGEL PI IND STRL 7.0 (GLOVE) IMPLANT
GLOVE BIOGEL PI IND STRL 7.5 (GLOVE) IMPLANT
GLOVE BIOGEL PI IND STRL 8 (GLOVE) ×8 IMPLANT
GLOVE ECLIPSE 8.0 STRL XLNG CF (GLOVE) ×4 IMPLANT
GOWN STRL REUS W/ TWL LRG LVL3 (GOWN DISPOSABLE) ×4 IMPLANT
GOWN STRL REUS W/ TWL XL LVL3 (GOWN DISPOSABLE) ×8 IMPLANT
K-WIRE DBL .062X4 NSTRL (WIRE)
K-WIRE TROC 1.25X150 (WIRE) ×6
KIT ACCESSORY DRILL 5 (KITS) IMPLANT
KIT TRANSTIBIAL (DISPOSABLE) IMPLANT
KWIRE DBL .062X4 NSTRL (WIRE) IMPLANT
KWIRE TROC 1.25X150 (WIRE) IMPLANT
NDL HYPO 22X1.5 SAFETY MO (MISCELLANEOUS) IMPLANT
NDL HYPO 25X1 1.5 SAFETY (NEEDLE) IMPLANT
NDL SUT 6 .5 CRC .975X.05 MAYO (NEEDLE) IMPLANT
NEEDLE HYPO 22X1.5 SAFETY MO (MISCELLANEOUS)
NEEDLE HYPO 25X1 1.5 SAFETY (NEEDLE)
NS IRRIG 1000ML POUR BTL (IV SOLUTION) ×4 IMPLANT
PACK BASIN DAY SURGERY FS (CUSTOM PROCEDURE TRAY) ×4 IMPLANT
PAD CAST 4YDX4 CTTN HI CHSV (CAST SUPPLIES) ×4 IMPLANT
PADDING CAST ABS COTTON 4X4 ST (CAST SUPPLIES) IMPLANT
PADDING CAST COTTON 6X4 STRL (CAST SUPPLIES) ×4 IMPLANT
PASSER SUT SWANSON 36MM LOOP (INSTRUMENTS) IMPLANT
PENCIL SMOKE EVACUATOR (MISCELLANEOUS) ×4 IMPLANT
RETRIEVER SUT HEWSON (MISCELLANEOUS) IMPLANT
SANITIZER HAND PURELL FF 515ML (MISCELLANEOUS) ×4 IMPLANT
SCOTCHCAST PLUS 4X4 WHITE (CAST SUPPLIES) IMPLANT
SCREW CANN PT 4X46 NS (Screw) IMPLANT
SCREW CANN PT 50X4 NS SM (Screw) IMPLANT
SCREW PEEK TENODESIS 6X12MM (Screw) IMPLANT
SCREW THREADED 4.0X46MM (Screw) ×3 IMPLANT
SHEET MEDIUM DRAPE 40X70 STRL (DRAPES) ×4 IMPLANT
SLEEVE SCD COMPRESS KNEE MED (STOCKING) ×4 IMPLANT
SPIKE FLUID TRANSFER (MISCELLANEOUS) IMPLANT
SPLINT PLASTER CAST FAST 5X30 (CAST SUPPLIES) ×80 IMPLANT
SPONGE T-LAP 18X18 ~~LOC~~+RFID (SPONGE) ×4 IMPLANT
STOCKINETTE 6 STRL (DRAPES) ×4 IMPLANT
SUCTION TUBE FRAZIER 10FR DISP (SUCTIONS) ×4 IMPLANT
SUT BONE WAX W31G (SUTURE) IMPLANT
SUT ETHIBOND 0 MO6 C/R (SUTURE) IMPLANT
SUT ETHIBOND 2 OS 4 DA (SUTURE) IMPLANT
SUT ETHILON 3 0 PS 1 (SUTURE) ×4 IMPLANT
SUT FIBERWIRE #2 38 T-5 BLUE (SUTURE)
SUT FIBERWIRE 2-0 18 17.9 3/8 (SUTURE)
SUT MERSILENE 2.0 SH NDLE (SUTURE) IMPLANT
SUT MNCRL AB 3-0 PS2 18 (SUTURE) ×4 IMPLANT
SUT MNCRL AB 4-0 PS2 18 (SUTURE) IMPLANT
SUT VIC AB 2-0 SH 18 (SUTURE) IMPLANT
SUT VIC AB 2-0 SH 27XBRD (SUTURE) IMPLANT
SUT VICRYL 0 SH 27 (SUTURE) ×4 IMPLANT
SUT VICRYL 0 UR6 27IN ABS (SUTURE) IMPLANT
SUTURE FIBERWR #2 38 T-5 BLUE (SUTURE) IMPLANT
SUTURE FIBERWR 2-0 18 17.9 3/8 (SUTURE) IMPLANT
SUTURE TAPE 1.3 FIBERLOP 20 ST (SUTURE) IMPLANT
SUTURETAPE 1.3 FIBERLOOP 20 ST (SUTURE)
SYR BULB EAR ULCER 3OZ GRN STR (SYRINGE) ×4 IMPLANT
SYR CONTROL 10ML LL (SYRINGE) IMPLANT
TOWEL GREEN STERILE FF (TOWEL DISPOSABLE) ×8 IMPLANT
TUBE CONNECTING 20X1/4 (TUBING) ×4 IMPLANT
UNDERPAD 30X36 HEAVY ABSORB (UNDERPADS AND DIAPERS) ×4 IMPLANT
YANKAUER SUCT BULB TIP NO VENT (SUCTIONS) IMPLANT

## 2023-10-01 NOTE — Anesthesia Postprocedure Evaluation (Signed)
Anesthesia Post Note  Patient: Erin Gallagher  Procedure(s) Performed: GASTROCNEMIUS RECESSION (Right: Leg Lower) CALCANEAL OSTEOTOMY (Right: Foot) POSTERIOR TIBIAL TENDON TENOLYSIS (Right: Ankle) FLEXOR DIGITORUM LONGUS TENDON TO NAVICULAR TRANSFER (Right: Ankle)     Patient location during evaluation: PACU Anesthesia Type: General Level of consciousness: awake and alert Pain management: pain level controlled Vital Signs Assessment: post-procedure vital signs reviewed and stable Respiratory status: spontaneous breathing, nonlabored ventilation and respiratory function stable Cardiovascular status: stable and blood pressure returned to baseline Anesthetic complications: no   No notable events documented.  Last Vitals:  Vitals:   10/01/23 1145 10/01/23 1207  BP: 138/88 (!) 148/85  Pulse: 82 89  Resp: 15 18  Temp:  (!) 36.3 C  SpO2: 99% 97%    Last Pain:  Vitals:   10/01/23 1229  TempSrc:   PainSc: 5                  Beryle Lathe

## 2023-10-01 NOTE — Anesthesia Preprocedure Evaluation (Addendum)
Anesthesia Evaluation  Patient identified by MRN, date of birth, ID band Patient awake    Reviewed: Allergy & Precautions, NPO status , Patient's Chart, lab work & pertinent test results  History of Anesthesia Complications Negative for: history of anesthetic complications  Airway Mallampati: II  TM Distance: >3 FB Neck ROM: Full    Dental  (+) Dental Advisory Given   Pulmonary sleep apnea (noncompliant) , PE   Pulmonary exam normal        Cardiovascular hypertension, Pt. on medications pulmonary hypertensionNormal cardiovascular exam   Leukocytoclastic vasculitis  '24 TTE (in setting of PE) - EF 55 to 60%. There is severe concentric left ventricular hypertrophy. Indeterminate diastolic filling due to E-A fusion. Right ventricular systolic function is moderately reduced. The right ventricular size is moderately enlarged. Mildly increased right ventricular wall thickness. There is severely elevated pulmonary artery systolic pressure. The estimated right ventricular systolic pressure is 62.8 mmHg. Right atrial size was mildly dilated. Tricuspid valve regurgitation is moderate to severe. Pulmonic valve regurgitation is moderate.     Neuro/Psych negative neurological ROS  negative psych ROS   GI/Hepatic negative GI ROS, Neg liver ROS,,,  Endo/Other    Class 3 obesity  Renal/GU negative Renal ROS     Musculoskeletal  (+) Arthritis ,    Abdominal  (+) + obese  Peds  Hematology  On eliquis    Anesthesia Other Findings   Reproductive/Obstetrics  s/p tubal ligation                              Anesthesia Physical Anesthesia Plan  ASA: 3  Anesthesia Plan: General   Post-op Pain Management: Regional block* and Tylenol PO (pre-op)*   Induction: Intravenous  PONV Risk Score and Plan: 3 and Treatment may vary due to age or medical condition, Ondansetron, Dexamethasone and Midazolam  Airway  Management Planned: Oral ETT  Additional Equipment: None  Intra-op Plan:   Post-operative Plan: Extubation in OR  Informed Consent: I have reviewed the patients History and Physical, chart, labs and discussed the procedure including the risks, benefits and alternatives for the proposed anesthesia with the patient or authorized representative who has indicated his/her understanding and acceptance.     Dental advisory given  Plan Discussed with: CRNA and Anesthesiologist  Anesthesia Plan Comments:         Anesthesia Quick Evaluation

## 2023-10-01 NOTE — Anesthesia Procedure Notes (Signed)
Anesthesia Regional Block: Adductor canal block   Pre-Anesthetic Checklist: , timeout performed,  Correct Patient, Correct Site, Correct Laterality,  Correct Procedure, Correct Position, site marked,  Risks and benefits discussed,  Surgical consent,  Pre-op evaluation,  At surgeon's request and post-op pain management  Laterality: Right  Prep: chloraprep       Needles:  Injection technique: Single-shot  Needle Type: Echogenic Needle     Needle Length: 10cm  Needle Gauge: 21     Additional Needles:   Narrative:  Start time: 10/01/2023 8:38 AM End time: 10/01/2023 8:41 AM Injection made incrementally with aspirations every 5 mL.  Performed by: Personally  Anesthesiologist: Beryle Lathe, MD  Additional Notes: No pain on injection. No increased resistance to injection. Injection made in 5cc increments. Good needle visualization. Patient tolerated the procedure well.

## 2023-10-01 NOTE — Transfer of Care (Signed)
Immediate Anesthesia Transfer of Care Note  Patient: Erin Gallagher [facility]  Procedure(s) Performed: GASTROCNEMIUS RECESSION (Right: Leg Lower) CALCANEAL OSTEOTOMY (Right: Foot) POSTERIOR TIBIAL TENDON TENOLYSIS (Right: Ankle) FLEXOR DIGITORUM LONGUS TENDON TO NAVICULAR TRANSFER (Right: Ankle)  Patient Location: PACU  Anesthesia Type:General and Regional  Level of Consciousness: drowsy  Airway & Oxygen Therapy: Patient Spontanous Breathing and Patient connected to face mask oxygen  Post-op Assessment: Report given to RN and Post -op Vital signs reviewed and stable  Post vital signs: Reviewed and stable  Last Vitals:  Vitals Value Taken Time  BP    Temp    Pulse    Resp    SpO2      Last Pain:  Vitals:   10/01/23 0754  TempSrc: Temporal  PainSc: 3          Complications: No notable events documented.

## 2023-10-01 NOTE — Discharge Instructions (Addendum)
Toni Arthurs, MD [facility]  Please read the following information regarding your care after surgery.  Medications  You only need a prescription for the narcotic pain medicine (ex. oxycodone, Percocet, Norco).  All of the other medicines listed below are available over the counter. ? acetominophen (Tylenol) 650 mg every 4-6 hours as you need for minor to moderate pain ? oxycodone as prescribed for severe pain  Narcotic pain medicine (ex. oxycodone, Percocet, Vicodin) will cause constipation.  To prevent this problem, take the following medicines while you are taking any pain medicine. ? docusate sodium (Colace) 100 mg twice a day ? senna (Senokot) 2 tablets twice a day  ? To help prevent blood clots, resume your eliquis the day after surgery.  You should also get up every hour while you are awake to move around.    Weight Bearing ? Bear weight when you are able on your operated leg or foot. ? Bear weight only on your operated foot in the post-op shoe. ? Do not bear any weight on the operated leg or foot.  Cast / Splint / Dressing ? Keep your splint, cast or dressing clean and dry.  Don't put anything (coat hanger, pencil, etc) down inside of it.  If it gets damp, use a hair dryer on the cool setting to dry it.  If it gets soaked, call the office to schedule an appointment for a cast change.   After your dressing, cast or splint is removed; you may shower, but do not soak or scrub the wound.  Allow the water to run over it, and then gently pat it dry.  Swelling It is normal for you to have swelling where you had surgery.  To reduce swelling and pain, keep your toes above your nose for at least 3 days after surgery.  It may be necessary to keep your foot or leg elevated for several weeks.  If it hurts, it should be elevated.  Follow Up Call my office at [phone] when you are discharged from the hospital or surgery center to schedule an appointment to be seen two weeks after  surgery.  Call my office at [phone] if you develop a fever >101.5 F, nausea, vomiting, bleeding from the surgical site or severe pain.     Post Anesthesia Home Care Instructions  Activity: Get plenty of rest for the remainder of the day. A responsible individual must stay with you for 24 hours following the procedure.  For the next 24 hours, DO NOT: -Drive a car -Advertising copywriter -Drink alcoholic beverages -Take any medication unless instructed by your physician -Make any legal decisions or sign important papers.  Meals: Start with liquid foods such as gelatin or soup. Progress to regular foods as tolerated. Avoid greasy, spicy, heavy foods. If nausea and/or vomiting occur, drink only clear liquids until the nausea and/or vomiting subsides. Call your physician if vomiting continues.  Special Instructions/Symptoms: Your throat may feel dry or sore from the anesthesia or the breathing tube placed in your throat during surgery. If this causes discomfort, gargle with warm salt water. The discomfort should disappear within 24 hours.  If you had a scopolamine patch placed behind your ear for the management of post- operative nausea and/or vomiting:  1. The medication in the patch is effective for 72 hours, after which it should be removed.  Wrap patch in a tissue and discard in the trash. Wash hands thoroughly with soap and water. 2. You may remove the patch earlier than 72  hours if you experience unpleasant side effects which may include dry mouth, dizziness or visual disturbances. 3. Avoid touching the patch. Wash your hands with soap and water after contact with the patch.    Regional Anesthesia Blocks  1. You may not be able to move or feel the "blocked" extremity after a regional anesthetic block. This may last may last from 3-48 hours after placement, but it will go away. The length of time depends on the medication injected and your individual response to the medication. As the  nerves start to wake up, you may experience tingling as the movement and feeling returns to your extremity. If the numbness and inability to move your extremity has not gone away after 48 hours, please call your surgeon.   2. The extremity that is blocked will need to be protected until the numbness is gone and the strength has returned. Because you cannot feel it, you will need to take extra care to avoid injury. Because it may be weak, you may have difficulty moving it or using it. You may not know what position it is in without looking at it while the block is in effect.  3. For blocks in the legs and feet, returning to weight bearing and walking needs to be done carefully. You will need to wait until the numbness is entirely gone and the strength has returned. You should be able to move your leg and foot normally before you try and bear weight or walk. You will need someone to be with you when you first try to ensure you do not fall and possibly risk injury.  4. Bruising and tenderness at the needle site are common side effects and will resolve in a few days.  5. Persistent numbness or new problems with movement should be communicated to the surgeon or the [facility] [phone] [facility] [phone]).

## 2023-10-01 NOTE — Progress Notes (Signed)
Assisted Dr. Mal Amabile with right, adductor canal, popliteal, ultrasound guided block. Side rails up, monitors on throughout procedure. See vital signs in flow sheet. Tolerated Procedure well.

## 2023-10-01 NOTE — H&P (Signed)
Erin Gallagher is an 53 y.o. female.   Chief Complaint: right foot pain HPI: 53 y/o female c/o right foot pain worsening over the last year.  She has a h/o PTTD and has failed nonop treatment to date.  She presents today for surgery for this painful and limiting condition.  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   Personal history of radiation therapy    Sleep apnea     Past Surgical History:  Procedure Laterality Date   BREAST BIOPSY Left 05/16/2019   BREAST LUMPECTOMY Left 06/14/2019   BREAST LUMPECTOMY WITH RADIOACTIVE SEED AND SENTINEL LYMPH NODE BIOPSY Left 06/14/2019   Procedure: LEFT BREAST LUMPECTOMY WITH RADIOACTIVE SEED AND SENTINEL LYMPH NODE BIOPSY;  Surgeon: Almond Lint, MD;  Location: [facility];  Service: General;  Laterality: Left;   CESAREAN SECTION     1997   CHOLECYSTECTOMY     TUBAL LIGATION     1998    Family History  Problem Relation Age of Onset   Hypertension Mother    Hypertension Father    Colon cancer Father        dx 57s   Throat cancer Maternal Uncle    Lung cancer Paternal Uncle    Stroke Maternal Grandmother    Hypertension Maternal Grandmother    Aneurysm Maternal Grandmother    Healthy Son    Social History:  reports that she has never smoked. She has been exposed to tobacco smoke. She has never used smokeless tobacco. She reports current alcohol use of about 2.0 standard drinks of alcohol per week. She reports that she does not use drugs.  Allergies:  Allergies  Allergen Reactions   Other Itching, Rash and Other (See Comments)    Steri Strips cause  itching and a rash    Medications Prior to Admission  Medication Sig Dispense Refill   amLODipine (NORVASC) 5 MG tablet Take 1 tablet (5 mg total) by mouth daily. 90 tablet 3   cyclobenzaprine (FLEXERIL) 10 MG tablet Take 1 tablet (10 mg total) by mouth at bedtime. (Patient taking differently: Take 10 mg by mouth at bedtime as needed for muscle spasms.) 30 tablet 0   losartan-hydrochlorothiazide (HYZAAR) 100-12.5 MG tablet Take 1 tablet by mouth daily. 90 tablet 3   VITAMIN D PO Take 1,000 Units by mouth daily.     albuterol (VENTOLIN HFA) 108 (90 Base) MCG/ACT inhaler Inhale 2 puffs into the lungs 2 (two) times daily for 5 days. (Patient taking differently: Inhale 2 puffs into the lungs 2 (two) times daily as needed for wheezing or shortness of breath.) 6.7 g 0   anastrozole (ARIMIDEX) 1 MG tablet Take 1 tablet (1 mg total) by mouth daily. 90 tablet 3   apixaban (ELIQUIS) 5 MG TABS tablet Take 1 tablet (5 mg total) by mouth 2 (two) times daily. 60 tablet 5   APIXABAN (ELIQUIS) VTE STARTER PACK (10MG  AND 5MG ) Take as directed on package: start with two-5mg  tablets twice daily for 7 days. On day 8, switch  to one-5mg  tablet twice daily. 74 each 0   nitroGLYCERIN (NITROSTAT) 0.4 MG SL tablet Place 1 tablet (0.4 mg total) under the tongue every 5 (five) minutes as needed for chest pain. 25 tablet 4   oxyCODONE-acetaminophen (PERCOCET) 10-325 MG tablet Take 1 tablet by mouth every 8 (eight) hours as needed for pain.     TYLENOL 500 MG tablet Take 500-1,000 mg by mouth every 6 (six) hours as needed for mild pain (pain score 1-3) or headache.      No results found for this or any previous visit (from the past 48 hours). No results found.  Review of Systems  no recent f/c/n/v/wt loss  Height 5\' 5"  (1.651 m), weight 113.9 kg, last menstrual period 12/30/2021. Physical Exam  Wn wd woman in nad.  A and O.  EOMI.  Resp unlabored.  R foot with collapse of the longitudinal arch.  Skin is heatlhy.  Pulses  are palpable.  NO lymphadneopathy.  4/5 strength in inversion.  TTP at the PTT.  Assessment/Plan R post tib tendon dysfunction and tight heelcord.  To the OR today for gastroc recession, calc osteotomy, PTT tenolysis and FDL transfer tot he navicular. The risks and benefits of the alternative treatment options have been discussed in detail.  The patient wishes to proceed with surgery and specifically understands risks of bleeding, infection, nerve damage, blood clots, need for additional surgery, amputation and death.   Toni Arthurs, MD Oct 03, 2023, 7:16 AM

## 2023-10-01 NOTE — Anesthesia Procedure Notes (Signed)
Anesthesia Regional Block: Popliteal block   Pre-Anesthetic Checklist: , timeout performed,  Correct Patient, Correct Site, Correct Laterality,  Correct Procedure, Correct Position, site marked,  Risks and benefits discussed,  Surgical consent,  Pre-op evaluation,  At surgeon's request and post-op pain management  Laterality: Right  Prep: chloraprep       Needles:  Injection technique: Single-shot  Needle Type: Echogenic Needle     Needle Length: 10cm  Needle Gauge: 21     Additional Needles:   Narrative:  Start time: 10/01/2023 8:41 AM End time: 10/01/2023 8:44 AM Injection made incrementally with aspirations every 5 mL.  Performed by: Personally  Anesthesiologist: Beryle Lathe, MD  Additional Notes: No pain on injection. No increased resistance to injection. Injection made in 5cc increments. Good needle visualization. Patient tolerated the procedure well.

## 2023-10-01 NOTE — Anesthesia Procedure Notes (Signed)
Procedure Name: LMA Insertion Date/Time: 10/01/2023 9:22 AM  Performed by: Roosvelt Harps, CRNAPre-anesthesia Checklist: Patient identified, Emergency Drugs available, Suction available and Patient being monitored Patient Re-evaluated:Patient Re-evaluated prior to induction Oxygen Delivery Method: Circle System Utilized Preoxygenation: Pre-oxygenation with 100% oxygen Induction Type: IV induction Ventilation: Mask ventilation without difficulty LMA: LMA inserted LMA Size: 4.0 Number of attempts: 1 Airway Equipment and Method: Bite block Placement Confirmation: positive ETCO2 and breath sounds checked- equal and bilateral Tube secured with: Tape Dental Injury: Teeth and Oropharynx as per pre-operative assessment

## 2023-10-01 NOTE — Op Note (Signed)
10/01/2023  10:47 AM  PATIENT:  Erin Gallagher  53 y.o. female  PRE-OPERATIVE DIAGNOSIS: 1.  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  Short right Achilles tendon  POST-OPERATIVE DIAGNOSIS: Same  Procedure(s): 1.  Right gastrocnemius recession through a separate incision 2.  Right medializing calcaneus osteotomy through a separate incision 3.  Right posterior tibial tendon tenolysis 4.  Deep transfer of the right flexor digitorum longus to the navicular 5.  Right foot AP, lateral and Harris heel radiographs  SURGEON:  Toni Arthurs, MD  ASSISTANT: Alfredo Martinez, PA-C  ANESTHESIA:   General, regional  EBL:  minimal   TOURNIQUET:   Total Tourniquet Time Documented: Thigh (Right) - 48 minutes Total: Thigh (Right) - 48 minutes  COMPLICATIONS:  None apparent  DISPOSITION:  Extubated, awake and stable to recovery.  INDICATION FOR PROCEDURE: 53 year old female with a past medical history significant for breast cancer in remission complains of worsening right foot pain.  She has signs and symptoms of posterior tibial tendon dysfunction and has failed nonoperative treatment including activity modification, oral anti-inflammatories, shoewear modification, bracing and physical therapy.  She presents now for surgical correction of this painful condition.  The risks and benefits of the alternative treatment options have been discussed in detail.  The patient wishes to proceed with surgery and specifically understands risks of bleeding, infection, nerve damage, blood clots, need for additional surgery, amputation and death.   PROCEDURE IN DETAIL:  After pre operative consent was obtained, and the correct operative site was identified, the patient was brought to the operating room and placed supine on the OR table.  Anesthesia was administered.  Pre-operative antibiotics were administered.  A surgical timeout was taken.  The right lower extremity was prepped and draped in standard  sterile fashion with a tourniquet around the thigh.  The extremity was elevated, and the tourniquet was inflated to 250 mmHg.  A longitudinal incision was then made over the medial calf.  Dissection was carried down through the subcutaneous tissues.  The superficial fascia was incised.  The gastrocnemius tendon was then identified.  The plantaris tendon was divided under direct vision.  The sural nerve was protected.  The gastrocnemius tendon was divided from medial to lateral under direct vision.  The ankle would then dorsiflex 30 degrees with the knee extended and hindfoot corrected to neutral.  The wound was irrigated and sprinkled with vancomycin powder.  The subcutaneous tissues were closed with inverted simple sutures of 3-0 Monocryl.  The skin incision was closed with 3-0 nylon.  Attention was turned to the lateral hindfoot.  An oblique incision was made over the lateral wall of the calcaneus.  Dissection was carried sharply down through the subcutaneous tissues.  The K wire was inserted at the isthmus of the calcaneal tuberosity.  Radiographs confirmed appropriate position of the guidepin.  An oblique osteotomy was then made just posterior to the guidewire.  The osteotomy was mobilized with an osteotome and lamina spreader.  The tuberosity was translated medially.  Radiographs confirmed appropriate translation.  The osteotomy was fixed with two 4 mm partially-threaded Zimmer [employer] cannulated screws.  The wounds were irrigated and sprinkled with vancomycin powder.  Skin incisions were closed with nylon.  Attention was turned to the medial aspect of the ankle and hindfoot.  An oblique incision was made along the course of the posterior tibial tendon.  Dissection was carried sharply down through the subcutaneous tissues.  The tendon sheath was incised.  There was significant  tenosynovitis noted along the posterior tibial tendon.  Careful tenolysis was performed with scissors removing all tenosynovitis and  the sheath and around the tendon.  The spring ligament was noted to be intact.  An incision was made in the FDL tendon sheath.  The tendon was identified.  Dissection was then carried distally down to the knot of Sherilyn Cooter.  The FDL tendon was transected just proximal to the knot.  A whipstitch was placed in the tendon and.  A drill hole was then made in the navicular tuberosity.  The tendon was pulled from plantar to dorsal through the drill hole and was secured with a 6 mm Zimmer [employer]omet bolt.  The tendon was further repaired to the rounding periosteum with horizontal mattress sutures of 0 Vicryl.  Final AP, lateral and Harris heel radiographs confirmed appropriate correction of the flatfoot deformity and appropriate position and length of all hardware.  The medial wound was irrigated copiously and sprinkled with vancomycin powder.  The tendon sheath was repaired with 2-0 Vicryl.  Subcutaneous tissues were approximated with 3-0 Monocryl.  Skin incision was closed with 3-0 nylon.  Sterile dressings were applied followed by a well-padded short leg splint.  The tourniquet was released after application of the dressings.  The patient was awakened from anesthesia and transported to the recovery room in stable condition.   FOLLOW UP PLAN: Nonweightbearing on the right lower extremity.  Follow-up in the office in 2 weeks for suture removal and conversion to a short leg cast.  The patient may resume Eliquis on postop day 1 for DVT prophylaxis.   RADIOGRAPHS: AP, lateral and Harris heel radiographs of the right foot are obtained intraoperatively.  These show interval correction of the flatfoot deformity with calcaneus osteotomy.  Hardware is appropriately positioned and of the appropriate lengths.  No other acute injuries are noted.    Alfredo Martinez PA-C was present and scrubbed for the duration of the operative case. His assistance was essential in positioning the patient, prepping and draping, gaining and  maintaining exposure, performing the operation, closing and dressing the wounds and applying the splint.

## 2023-10-02 ENCOUNTER — Encounter (HOSPITAL_BASED_OUTPATIENT_CLINIC_OR_DEPARTMENT_OTHER): Payer: Self-pay | Admitting: Orthopedic Surgery

## 2023-10-09 ENCOUNTER — Other Ambulatory Visit (HOSPITAL_COMMUNITY): Payer: Self-pay

## 2023-10-09 MED ORDER — HYDROCODONE-ACETAMINOPHEN 5-325 MG PO TABS
1.0000 | ORAL_TABLET | Freq: Four times a day (QID) | ORAL | 0 refills | Status: DC | PRN
  Filled 2023-10-09: qty 20, 5d supply, fill #0

## 2023-10-12 DIAGNOSIS — M67962 Unspecified disorder of synovium and tendon, left lower leg: Secondary | ICD-10-CM | POA: Diagnosis not present

## 2023-10-12 DIAGNOSIS — Z4889 Encounter for other specified surgical aftercare: Secondary | ICD-10-CM | POA: Diagnosis not present

## 2023-10-12 DIAGNOSIS — M67961 Unspecified disorder of synovium and tendon, right lower leg: Secondary | ICD-10-CM | POA: Diagnosis not present

## 2023-10-22 ENCOUNTER — Encounter: Payer: Self-pay | Admitting: Adult Health

## 2023-11-01 DIAGNOSIS — G4733 Obstructive sleep apnea (adult) (pediatric): Secondary | ICD-10-CM | POA: Diagnosis not present

## 2023-11-04 ENCOUNTER — Telehealth: Payer: Self-pay

## 2023-11-04 NOTE — Telephone Encounter (Signed)
 Called pt regarding MyChart message, She reports feeling a "hard knot" to her left axillary region. She states it is painful and has a hx of a seroma to that area. Currently, she denies fever, redness, hot to touch, swelling.  Advised pt she would be best suited for physical visit rather than virtual to better assess the area of concern. She is agreeable to this and will come 11/13/23 at 1100 to see Mardella Layman, NP. She knows to call us back in the interim if she should develop any of the sx listed above.

## 2023-11-09 DIAGNOSIS — M67961 Unspecified disorder of synovium and tendon, right lower leg: Secondary | ICD-10-CM | POA: Diagnosis not present

## 2023-11-09 DIAGNOSIS — Z4889 Encounter for other specified surgical aftercare: Secondary | ICD-10-CM | POA: Diagnosis not present

## 2023-11-13 ENCOUNTER — Encounter: Payer: Self-pay | Admitting: Adult Health

## 2023-11-13 ENCOUNTER — Inpatient Hospital Stay: Attending: Hematology and Oncology | Admitting: Adult Health

## 2023-11-13 VITALS — BP 156/88 | HR 89 | Temp 97.9°F | Resp 18 | Ht 65.0 in | Wt 248.0 lb

## 2023-11-13 DIAGNOSIS — Z7901 Long term (current) use of anticoagulants: Secondary | ICD-10-CM | POA: Diagnosis not present

## 2023-11-13 DIAGNOSIS — Z86711 Personal history of pulmonary embolism: Secondary | ICD-10-CM | POA: Insufficient documentation

## 2023-11-13 DIAGNOSIS — Z79811 Long term (current) use of aromatase inhibitors: Secondary | ICD-10-CM | POA: Diagnosis not present

## 2023-11-13 DIAGNOSIS — R590 Localized enlarged lymph nodes: Secondary | ICD-10-CM

## 2023-11-13 DIAGNOSIS — Z170 Estrogen receptor positive status [ER+]: Secondary | ICD-10-CM | POA: Diagnosis not present

## 2023-11-13 DIAGNOSIS — Z1721 Progesterone receptor positive status: Secondary | ICD-10-CM | POA: Diagnosis not present

## 2023-11-13 DIAGNOSIS — Z923 Personal history of irradiation: Secondary | ICD-10-CM | POA: Insufficient documentation

## 2023-11-13 DIAGNOSIS — Z801 Family history of malignant neoplasm of trachea, bronchus and lung: Secondary | ICD-10-CM | POA: Diagnosis not present

## 2023-11-13 DIAGNOSIS — Z803 Family history of malignant neoplasm of breast: Secondary | ICD-10-CM | POA: Insufficient documentation

## 2023-11-13 DIAGNOSIS — Z1732 Human epidermal growth factor receptor 2 negative status: Secondary | ICD-10-CM | POA: Diagnosis not present

## 2023-11-13 DIAGNOSIS — C50412 Malignant neoplasm of upper-outer quadrant of left female breast: Secondary | ICD-10-CM | POA: Diagnosis not present

## 2023-11-13 DIAGNOSIS — Z800 Family history of malignant neoplasm of digestive organs: Secondary | ICD-10-CM | POA: Diagnosis not present

## 2023-11-13 NOTE — Progress Notes (Signed)
 [facility] Cancer Follow up:    Erin Quint, MD [address] [place] [address]   DIAGNOSIS:  Cancer Staging  Malignant neoplasm of upper-outer quadrant of left breast in female, estrogen receptor positive (HCC) Staging form: Breast, AJCC 8th Edition - Clinical stage from 05/25/2019: Stage IA (cT1c, cN0, cM0, G1, ER+, PR+, HER2-) - Unsigned Stage prefix: Initial diagnosis Method of lymph node assessment: Clinical Histologic grading system: 3 grade system - Pathologic stage from 06/29/2019: Stage IA (pT1c, pN0(sn), cM0, G2, ER+, PR+, HER2-, Oncotype DX score: 13) - Unsigned Stage prefix: Initial diagnosis Method of lymph node assessment: Sentinel lymph node biopsy Multigene prognostic tests performed: Oncotype DX Recurrence score range: Greater than or equal to 11 Histologic grading system: 3 grade system   SUMMARY OF ONCOLOGIC HISTORY: Oncology History  Malignant neoplasm of upper-outer quadrant of left breast in female, estrogen receptor positive (HCC)  05/20/2019 Initial Diagnosis   Routine screening mammogram detected a 1.4cm left breast mass at the 2:30 position, no axillary adenopathy. 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    06/05/2019 Genetic Testing   Negative genetic testing. No pathogenic variants identified on the [facility] Common Hereditary Cancers Panel + STAT Breast Cancer panel.The STAT Breast cancer panel offered by [facility] includes sequencing and rearrangement analysis for the following 9 genes:  ATM, BRCA1, BRCA2, CDH1, CHEK2, PALB2, PTEN, STK11 and TP53. The Common Hereditary Cancers Panel offered by [facility] includes sequencing and/or deletion duplication testing of the following 47 genes: APC, ATM, AXIN2, BARD1, BMPR1A, BRCA1, BRCA2, BRIP1, CDH1, CDKN2A (p14ARF), CDKN2A (p16INK4a), CKD4, CHEK2, CTNNA1, DICER1, EPCAM (Deletion/duplication testing only), GREM1 (promoter region  deletion/duplication testing only), KIT, MEN1, MLH1, MSH2, MSH3, MSH6, MUTYH, NBN, NF1, NHTL1, PALB2, PDGFRA, PMS2, POLD1, POLE, PTEN, RAD50, RAD51C, RAD51D, SDHB, SDHC, SDHD, SMAD4, SMARCA4. STK11, TP53, TSC1, TSC2, and VHL.  The following genes were evaluated for sequence changes only: SDHA and HOXB13 c.251G>A variant only. The report date is 06/05/2019.    06/14/2019 Surgery   Left lumpectomy Donell Beers) [phone]): IDC, grade 2, with high grade DCIS, 1.7cm, clear margins, 2 left axillary lymph nodes negative for carcinoma.    06/24/2019 Oncotype testing   Recurrence score: 13; low risk, distant recurrence at 9 years: 4%   07/13/2019 - 08/31/2019 Radiation Therapy   The patient initially received a dose of 50.4 Gy in 28 fractions to the breast using whole-breast tangent fields. This was delivered using a 3-D conformal technique. The patient then received a boost to the seroma. This delivered an additional 10 Gy in 5 fractions using a 3-field photon boost technique. The total dose was 60.4 Gy.   09/2019 - 08/2023 Anti-estrogen oral therapy   Tamoxifen--stopped early due to pulmonary embolus   10/2023 -  Anti-estrogen oral therapy   Anastrozole daily     CURRENT THERAPY: Anastrozole  INTERVAL HISTORY:  Discussed the use of AI scribe software for clinical note transcription with the patient, who gave verbal consent to proceed.   Erin Gallagher 53 y.o. female  a patient with a history of left-sided breast cancer treated with lumpectomy, adjuvant radiation, and currently on anastrozole, presents with a new concern in her left underarm. She noticed this area two to three weeks ago and describes it as being located under the lymph node. She denies any recent injections, cat scratches, or lesions on that side.  In addition to her breast concern, she also reports experiencing shortness  of breath. She is unsure if this is a residual effect from her hospitalization in December for a pulmonary  embolus. She is currently on Eliquis for this condition. She also mentions a history of knee or foot surgery, during which her Eliquis was briefly held and then resumed.   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   Personal history of radiation therapy    Sleep apnea     SURGICAL HISTORY: Past Surgical History:  Procedure Laterality Date   BREAST BIOPSY Left 05/16/2019   BREAST LUMPECTOMY Left 06/14/2019   BREAST LUMPECTOMY WITH RADIOACTIVE SEED AND SENTINEL LYMPH NODE BIOPSY Left 06/14/2019   Procedure: LEFT BREAST LUMPECTOMY WITH  RADIOACTIVE SEED AND SENTINEL LYMPH NODE BIOPSY;  Surgeon: Almond Lint, MD;  Location: [facility];  Service: General;  Laterality: Left;   CALCANEAL OSTEOTOMY Right 10/01/2023   Procedure: CALCANEAL OSTEOTOMY;  Surgeon: Toni Arthurs, MD;  Location: [facility];  Service: Orthopedics;  Laterality: Right;   CESAREAN SECTION     1997   CHOLECYSTECTOMY     GASTROCNEMIUS RECESSION Right 10/01/2023   Procedure: GASTROCNEMIUS RECESSION;  Surgeon: Toni Arthurs, MD;  Location: [facility];  Service: Orthopedics;  Laterality: Right;   POSTERIOR TIBIAL TENDON REPAIR Right 10/01/2023   Procedure: POSTERIOR TIBIAL TENDON TENOLYSIS;  Surgeon: Toni Arthurs, MD;  Location: [facility];  Service: Orthopedics;  Laterality: Right;   TUBAL LIGATION     1998    SOCIAL HISTORY: Social History   Socioeconomic History   Marital status: Married    Spouse name: Not on file   Number of children: 1   Years of education: Not on file   Highest education level: 12th grade  Occupational History   Occupation: Merchandiser, retail  Tobacco Use   Smoking status: Never    Passive exposure: Current   Smokeless tobacco: Never  Vaping Use   Vaping status: Never Used  Substance and Sexual Activity   Alcohol use: Yes    Alcohol/week: 2.0 standard drinks of alcohol    Types: 2 Standard drinks or  equivalent per week    Comment: occasionally - 2 out of a 7 day period   Drug use: No   Sexual activity: Yes    Birth control/protection: Surgical    Comment: Tubal ligation  Other Topics Concern   Not on file  Social History Narrative   Caffiene 1 cup coffee daily.    Lives home with spouse   Works at [employer]   Education 57yrs   Children one.    Social Drivers of Corporate investment banker Strain: Low Risk  (08/16/2023)   Overall Financial Resource Strain (CARDIA)    Difficulty of Paying Living Expenses: Not very hard  Food Insecurity: No Food Insecurity  (08/20/2023)   Hunger Vital Sign    Worried About Running Out of Food in the Last Year: Never true    Ran Out of Food in the Last Year: Never true  Transportation Needs: No Transportation Needs (08/20/2023)   PRAPARE - Administrator, Civil Service (Medical): No    Lack of Transportation (Non-Medical): No  Physical Activity: Insufficiently Active (08/16/2023)   Exercise Vital Sign    Days of Exercise per Week: 3 days    Minutes of Exercise per Session: 30 min  Stress: No Stress Concern Present (08/16/2023)   [employer] of Occupational Health - Occupational Stress Questionnaire    Feeling of Stress : Only a little  Recent Concern: Stress - Stress Concern Present (06/30/2023)   [employer] of Occupational Health - Occupational Stress Questionnaire    Feeling of Stress : To some extent  Social Connections: Unknown (08/16/2023)   Social Connection and Isolation Panel [NHANES]    Frequency of Communication with Friends and Family: More than three times a week    Frequency of Social Gatherings with Friends and Family: Twice a week    Attends Religious Services: Patient declined    Database administrator or Organizations: No    Attends Engineer, structural: Not on file    Marital Status: Married  Catering manager Violence: Not At Risk (08/20/2023)   Humiliation, Afraid, Rape, and Kick questionnaire    Fear of Current or Ex-Partner: No    Emotionally Abused: No    Physically Abused: No    Sexually Abused: No    FAMILY HISTORY: Family History  Problem Relation Age of Onset   Hypertension Mother    Hypertension Father    Colon cancer Father        dx 14s   Throat cancer Maternal Uncle    Lung cancer Paternal Uncle    Stroke Maternal Grandmother    Hypertension Maternal Grandmother    Aneurysm Maternal Grandmother    Healthy Son     Review of Systems  Constitutional:  Negative for appetite change, chills, fatigue, fever and unexpected weight  change.  HENT:   Negative for hearing loss, lump/mass and trouble swallowing.   Eyes:  Negative for eye problems and icterus.  Respiratory:  Negative for chest tightness, cough and shortness of breath.   Cardiovascular:  Negative for chest pain, leg swelling and palpitations.  Gastrointestinal:  Negative for abdominal distention, abdominal pain, constipation, diarrhea, nausea and vomiting.  Endocrine: Negative for hot flashes.  Genitourinary:  Negative for difficulty urinating.   Musculoskeletal:  Negative for arthralgias.  Skin:  Negative for itching and rash.  Neurological:  Negative for dizziness, extremity weakness, headaches and numbness.  Hematological:  Negative for adenopathy. Does not bruise/bleed easily.  Psychiatric/Behavioral:  Negative  for depression. The patient is not nervous/anxious.       PHYSICAL EXAMINATION    Vitals:   11/13/23 1103  BP: (!) 156/88  Pulse: 89  Resp: 18  Temp: 97.9 F (36.6 C)    Physical Exam Constitutional:      General: She is not in acute distress.    Appearance: Normal appearance. She is not toxic-appearing.  HENT:     Head: Normocephalic and atraumatic.     Mouth/Throat:     Mouth: Mucous membranes are moist.     Pharynx: Oropharynx is clear. No oropharyngeal exudate or posterior oropharyngeal erythema.  Eyes:     General: No scleral icterus. Cardiovascular:     Rate and Rhythm: Normal rate and regular rhythm.     Pulses: Normal pulses.     Heart sounds: Normal heart sounds.  Pulmonary:     Effort: Pulmonary effort is normal.     Breath sounds: Normal breath sounds.  Chest:       Comments: Bilateral breast benign, left axilla with thickness noted in axillary tail Abdominal:     General: Abdomen is flat. Bowel sounds are normal. There is no distension.     Palpations: Abdomen is soft.     Tenderness: There is no abdominal tenderness.  Musculoskeletal:        General: No swelling.     Cervical back: Neck supple.   Lymphadenopathy:     Cervical: No cervical adenopathy.     Upper Body:     Right upper body: No supraclavicular or axillary adenopathy.     Left upper body: Axillary adenopathy present. No supraclavicular adenopathy.  Skin:    General: Skin is warm and dry.     Findings: No rash.  Neurological:     General: No focal deficit present.     Mental Status: She is alert.  Psychiatric:        Mood and Affect: Mood normal.        Behavior: Behavior normal.     LABORATORY DATA:  CBC    Component Value Date/Time   WBC 7.9 09/16/2023 1031   WBC 8.4 08/22/2023 0253   RBC 5.42 (H) 09/16/2023 1031   HGB 14.3 09/16/2023 1031   HGB 13.4 06/01/2020 1012   HCT 43.7 09/16/2023 1031   HCT 41.8 06/01/2020 1012   PLT 280 09/16/2023 1031   PLT 339 06/01/2020 1012   MCV 80.6 09/16/2023 1031   MCV 83 06/01/2020 1012   MCH 26.4 09/16/2023 1031   MCHC 32.7 09/16/2023 1031   RDW 14.1 09/16/2023 1031   RDW 14.0 06/01/2020 1012   LYMPHSABS 1.8 09/16/2023 1031   LYMPHSABS 2.0 11/23/2017 1648   MONOABS 0.4 09/16/2023 1031   EOSABS 0.1 09/16/2023 1031   EOSABS 0.1 11/23/2017 1648   BASOSABS 0.0 09/16/2023 1031   BASOSABS 0.0 11/23/2017 1648    CMP     Component Value Date/Time   NA 139 09/16/2023 1031   NA 142 02/06/2023 1140   K 3.9 09/16/2023 1031   CL 107 09/16/2023 1031   CO2 25 09/16/2023 1031   GLUCOSE 101 (H) 09/16/2023 1031   BUN 11 09/16/2023 1031   BUN 8 02/06/2023 1140   CREATININE 0.79 09/16/2023 1031   CREATININE 0.73 07/10/2014 0919   CALCIUM 9.5 09/16/2023 1031   PROT 7.5 09/16/2023 1031   PROT 6.8 03/01/2020 1107   ALBUMIN 4.1 09/16/2023 1031   ALBUMIN 4.1 03/01/2020 1107   AST 18 09/16/2023 1031  ALT 21 09/16/2023 1031   ALKPHOS 57 09/16/2023 1031   BILITOT 0.5 09/16/2023 1031   GFRNONAA >60 09/16/2023 1031   GFRNONAA >89 07/10/2014 0919   GFRAA 112 06/01/2020 1012   GFRAA >60 05/25/2019 0810   GFRAA >89 07/10/2014 0919        ASSESSMENT and THERAPY  PLAN:   Malignant neoplasm of upper-outer quadrant of left breast in female, 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: Routine screening mammogram detected a 1.4cm left breast mass at the 2:30 position, no axillary adenopathy. 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 Post-lumpectomy and radiation. On anastrozole. December 2024 mammogram negative for malignancy.  Left axillary tenderness Tenderness near lymph nodes. Differential includes scar tissue, lymphadenopathy, or benign causes. - Order ultrasound of the left axillary region. - Order mammogram of the left breast.  Anastrozole side effects Experiencing night sweats. Discussed adjustment period and benefits outweighing discomfort.  Pulmonary embolism Occurred December 2024. On Eliquis. Shortness of breath may relate to embolism. - Continue Eliquis as prescribed.  Follow-up Plans to address axillary tenderness and monitor breast cancer. - Schedule follow-up appointment after ultrasound and mammogram results are available.  All questions were answered. The patient knows to call the clinic with any problems, questions or concerns. We can certainly see the patient much sooner if necessary.  Total encounter time:20 minutes*in face-to-face visit time, chart review, lab review, care coordination, order entry, and documentation of the  encounter time.    Lillard Anes, NP 11/16/23 8:18 AM Medical Oncology and Hematology [facility] [address] [place], [place] [address] Tel. [phone]    Fax. [phone]  *Total Encounter Time as defined by the Centers for Medicare and Medicaid Services includes, in addition to the face-to-face time of a patient visit (documented in the note above) non-face-to-face time: obtaining and reviewing outside history, ordering and reviewing medications, tests or procedures, care coordination (communications with other health care professionals or caregivers) and documentation in the medical record.

## 2023-11-16 NOTE — Assessment & Plan Note (Signed)
 05/20/2019: Routine screening mammogram detected a 1.4cm left breast mass at the 2:30 position, no axillary adenopathy. 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 Post-lumpectomy and radiation. On anastrozole. December 2024 mammogram negative for malignancy.  Left axillary tenderness Tenderness near lymph nodes. Differential includes scar tissue, lymphadenopathy, or benign causes. - Order ultrasound of the left axillary region. - Order mammogram of the left breast.  Anastrozole side effects Experiencing night sweats. Discussed adjustment period and benefits outweighing discomfort.  Pulmonary embolism Occurred December 2024. On Eliquis. Shortness of breath may relate to embolism. - Continue Eliquis as prescribed.  Follow-up Plans to address axillary tenderness and monitor breast cancer. - Schedule follow-up appointment after ultrasound and mammogram results are available.

## 2023-11-30 ENCOUNTER — Ambulatory Visit
Admission: RE | Admit: 2023-11-30 | Discharge: 2023-11-30 | Disposition: A | Discharge status: Home / Self Care | Source: Ambulatory Visit | Attending: Adult Health | Admitting: Adult Health

## 2023-11-30 ENCOUNTER — Other Ambulatory Visit (HOSPITAL_COMMUNITY): Payer: Self-pay

## 2023-11-30 DIAGNOSIS — C50412 Malignant neoplasm of upper-outer quadrant of left female breast: Secondary | ICD-10-CM

## 2023-11-30 DIAGNOSIS — Z853 Personal history of malignant neoplasm of breast: Secondary | ICD-10-CM | POA: Diagnosis not present

## 2023-11-30 DIAGNOSIS — R590 Localized enlarged lymph nodes: Secondary | ICD-10-CM

## 2023-11-30 DIAGNOSIS — Z170 Estrogen receptor positive status [ER+]: Secondary | ICD-10-CM

## 2023-11-30 DIAGNOSIS — R928 Other abnormal and inconclusive findings on diagnostic imaging of breast: Secondary | ICD-10-CM | POA: Diagnosis not present

## 2023-12-11 ENCOUNTER — Encounter: Payer: Self-pay | Admitting: Emergency Medicine

## 2023-12-11 DIAGNOSIS — M67962 Unspecified disorder of synovium and tendon, left lower leg: Secondary | ICD-10-CM | POA: Diagnosis not present

## 2023-12-11 DIAGNOSIS — Z4889 Encounter for other specified surgical aftercare: Secondary | ICD-10-CM | POA: Diagnosis not present

## 2023-12-11 DIAGNOSIS — M67961 Unspecified disorder of synovium and tendon, right lower leg: Secondary | ICD-10-CM | POA: Diagnosis not present

## 2023-12-11 NOTE — Telephone Encounter (Signed)
 She needs to be seen.  Recommend office visit, urgent care or even emergency department depending on severity of symptoms.  Thanks.

## 2023-12-14 ENCOUNTER — Encounter: Payer: Self-pay | Admitting: Emergency Medicine

## 2023-12-14 ENCOUNTER — Ambulatory Visit: Admitting: Emergency Medicine

## 2023-12-14 VITALS — BP 132/94 | HR 92 | Temp 98.4°F | Ht 65.0 in | Wt 248.0 lb

## 2023-12-14 DIAGNOSIS — I10 Essential (primary) hypertension: Secondary | ICD-10-CM

## 2023-12-14 DIAGNOSIS — R232 Flushing: Secondary | ICD-10-CM | POA: Insufficient documentation

## 2023-12-14 DIAGNOSIS — Z86711 Personal history of pulmonary embolism: Secondary | ICD-10-CM | POA: Insufficient documentation

## 2023-12-14 DIAGNOSIS — M79605 Pain in left leg: Secondary | ICD-10-CM | POA: Diagnosis not present

## 2023-12-14 LAB — VITAMIN D 25 HYDROXY (VIT D DEFICIENCY, FRACTURES): VITD: 30.11 ng/mL (ref 30.00–100.00)

## 2023-12-14 LAB — COMPREHENSIVE METABOLIC PANEL WITH GFR
ALT: 23 U/L (ref 0–35)
AST: 18 U/L (ref 0–37)
Albumin: 4.2 g/dL (ref 3.5–5.2)
Alkaline Phosphatase: 72 U/L (ref 39–117)
BUN: 9 mg/dL (ref 6–23)
CO2: 28 meq/L (ref 19–32)
Calcium: 9.5 mg/dL (ref 8.4–10.5)
Chloride: 104 meq/L (ref 96–112)
Creatinine, Ser: 0.79 mg/dL (ref 0.40–1.20)
GFR: 85.48 mL/min (ref >60.00)
Glucose, Bld: 101 mg/dL — ABNORMAL HIGH (ref 70–99)
Potassium: 4 meq/L (ref 3.5–5.1)
Sodium: 140 meq/L (ref 135–145)
Total Bilirubin: 0.4 mg/dL (ref 0.2–1.2)
Total Protein: 7.5 g/dL (ref 6.0–8.3)

## 2023-12-14 LAB — CBC WITH DIFFERENTIAL/PLATELET
Basophils Absolute: 0 10*3/uL (ref 0.0–0.1)
Basophils Relative: 0.3 % (ref 0.0–3.0)
Eosinophils Absolute: 0.1 10*3/uL (ref 0.0–0.7)
Eosinophils Relative: 1.7 % (ref 0.0–5.0)
HCT: 41.3 % (ref 36.0–46.0)
Hemoglobin: 13.7 g/dL (ref 12.0–15.0)
Lymphocytes Relative: 27.1 % (ref 12.0–46.0)
Lymphs Abs: 2.1 10*3/uL (ref 0.7–4.0)
MCHC: 33.1 g/dL (ref 30.0–36.0)
MCV: 79.8 fl (ref 78.0–100.0)
Monocytes Absolute: 0.5 10*3/uL (ref 0.1–1.0)
Monocytes Relative: 6.2 % (ref 3.0–12.0)
Neutro Abs: 5.1 10*3/uL (ref 1.4–7.7)
Neutrophils Relative %: 64.7 % (ref 43.0–77.0)
Platelets: 351 10*3/uL (ref 150.0–400.0)
RBC: 5.17 Mil/uL — ABNORMAL HIGH (ref 3.87–5.11)
RDW: 14.5 % (ref 11.5–15.5)
WBC: 7.9 10*3/uL (ref 4.0–10.5)

## 2023-12-14 LAB — HEMOGLOBIN A1C: Hgb A1c MFr Bld: 6.6 % — ABNORMAL HIGH (ref 4.6–6.5)

## 2023-12-14 LAB — TSH: TSH: 1.51 u[IU]/mL (ref 0.35–5.50)

## 2023-12-14 LAB — VITAMIN B12: Vitamin B-12: 446 pg/mL (ref 211–911)

## 2023-12-14 NOTE — Assessment & Plan Note (Signed)
 BP Readings from Last 3 Encounters:  12/14/23 (!) 132/94  11/13/23 (!) 156/88  10/01/23 (!) 148/85  Better control hypertension Continue amlodipine 5 mg daily and Hyzaar 100-12.5 mg daily Cardiovascular risks associated with hypertension discussed

## 2023-12-14 NOTE — Assessment & Plan Note (Signed)
 Leg pain and swelling but minimal physical findings Recommend leg ultrasound to rule out DVT However she is already on Eliquis 5 mg twice a day which is protective against thromboembolic events.

## 2023-12-14 NOTE — Progress Notes (Signed)
 Erin Gallagher 53 y.o.   Chief Complaint  Patient presents with   Breathing Problem    Having shortness of breath sometimes ( has been going for about  weeks), and want left leg check for blood clot due to feeling heaviness to it    HISTORY OF PRESENT ILLNESS: This is a 53 y.o. female with history of pulmonary embolism on Eliquis twice a day since last January Complaining of crampy heavy feeling to left lower leg Recently had surgery on right foot Has occasional bouts of shortness of breath but not today Occasional hot flashes and sweats No other complaints or medical concerns today.  Breathing Problem She complains of difficulty breathing. There is no cough or shortness of breath. Pertinent negatives include no chest pain, fever, headaches or sore throat.     Prior to Admission medications   Medication Sig Start Date End Date Taking? Authorizing Provider  amLODipine (NORVASC) 5 MG tablet Take 1 tablet (5 mg total) by mouth daily. 03/11/23  Yes Rayshard Schirtzinger, Isidro Margo, MD  anastrozole (ARIMIDEX) 1 MG tablet Take 1 tablet (1 mg total) by mouth daily. 09/30/23  Yes Causey, Laura Polio, NP  apixaban (ELIQUIS) 5 MG TABS tablet Take 1 tablet (5 mg total) by mouth 2 (two) times daily. 09/16/23  Yes Causey, Lindsey Cornetto, NP  cyclobenzaprine (FLEXERIL) 10 MG tablet Take 1 tablet (10 mg total) by mouth at bedtime. Patient taking differently: Take 10 mg by mouth at bedtime as needed for muscle spasms. 07/01/23  Yes Afifa Truax, Isidro Margo, MD  docusate sodium (COLACE) 100 MG capsule Take 1 capsule (100 mg total) by mouth 2 (two) times daily. While taking narcotic pain medicine. 10/01/23  Yes Debbra Fairy, PA-C  HYDROcodone-acetaminophen (NORCO/VICODIN) 5-325 MG tablet Take 1 tablet by mouth every 6 (six) hours as needed for 5 days 10/09/23  Yes   losartan-hydrochlorothiazide (HYZAAR) 100-12.5 MG tablet Take 1 tablet by mouth daily. 03/11/23  Yes Quantez Schnyder, Isidro Margo, MD  nitroGLYCERIN  (NITROSTAT) 0.4 MG SL tablet Place 1 tablet (0.4 mg total) under the tongue every 5 (five) minutes as needed for chest pain. 02/02/23  Yes Arleen Lacer, MD  senna (SENOKOT) 8.6 MG TABS tablet Take 2 tablets (17.2 mg total) by mouth 2 (two) times daily. 10/01/23  Yes Debbra Fairy, PA-C  TYLENOL 500 MG tablet Take 500-1,000 mg by mouth every 6 (six) hours as needed for mild pain (pain score 1-3) or headache.   Yes [provider]  VITAMIN D PO Take 1,000 Units by mouth daily.   Yes [provider]  albuterol (VENTOLIN HFA) 108 (90 Base) MCG/ACT inhaler Inhale 2 puffs into the lungs 2 (two) times daily for 5 days. Patient taking differently: Inhale 2 puffs into the lungs 2 (two) times daily as needed for wheezing or shortness of breath. 08/16/23 09/06/23  Elvira Hammersmith, MD    Allergies  Allergen Reactions   Other Itching, Rash and Other (See Comments)    Steri Strips cause itching and a rash    Patient Active Problem List   Diagnosis Date Noted   Pulmonary embolism (HCC) 08/20/2023   Pulmonary embolism without acute cor pulmonale (HCC) 08/19/2023   Left leg claudication (HCC) 08/19/2023   Prolonged QT interval 08/19/2023   Degeneration of intervertebral disc of lumbar region with discogenic back pain and lower extremity pain 07/29/2023   Bilateral swelling of feet and ankles 03/11/2023   Metabolic syndrome 02/08/2023   Situational hypertension 02/02/2023   Family  history of coronary artery disease 12/18/2022   Pes planus 05/13/2022   ANA positive 01/20/2022   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   Suspected sleep apnea 10/30/2021   BMI 40.0-44.9, adult (HCC) 02/06/2021   Morbid obesity (HCC) 02/06/2021   History of breast cancer 02/06/2021   Malignant neoplasm of upper-outer quadrant of left breast in female, 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   Arthritis 11/23/2017   Essential hypertension 06/22/2013    Past Medical History:   Diagnosis Date   ANA positive 01/20/2022   Arthritis    Blood transfusion without reported diagnosis    Cancer (HCC)    left breast cancer   Family history of breast cancer    Family history of colon cancer    Family history of lung cancer    Family history of throat cancer    Gall bladder disease    gall bladder removal   Hypertension    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   Personal history of radiation therapy    Sleep apnea     Past Surgical History:  Procedure Laterality Date   BREAST BIOPSY Left 05/16/2019   BREAST LUMPECTOMY Left 06/14/2019   BREAST LUMPECTOMY WITH RADIOACTIVE SEED AND SENTINEL LYMPH NODE BIOPSY Left 06/14/2019   Procedure: LEFT BREAST LUMPECTOMY WITH RADIOACTIVE SEED AND SENTINEL LYMPH NODE BIOPSY;  Surgeon: Lockie Rima, MD;  Location: [facility];  Service: General;  Laterality: Left;   CALCANEAL OSTEOTOMY Right 10/01/2023   Procedure: CALCANEAL OSTEOTOMY;  Surgeon: Amada Backer, MD;  Location: [facility];  Service: Orthopedics;  Laterality: Right;   CESAREAN SECTION     1997   CHOLECYSTECTOMY     GASTROCNEMIUS RECESSION Right 10/01/2023   Procedure: GASTROCNEMIUS RECESSION;  Surgeon: Amada Backer, MD;  Location: [facility];  Service: Orthopedics;  Laterality: Right;   POSTERIOR TIBIAL TENDON REPAIR Right 10/01/2023   Procedure: POSTERIOR TIBIAL TENDON TENOLYSIS;  Surgeon: Amada Backer, MD;  Location: [facility];  Service: Orthopedics;  Laterality: Right;   TUBAL LIGATION     1998    Social History   Socioeconomic History   Marital status: Married    Spouse name: Not on file   Number of children: 1   Years of education: Not on file   Highest education level: 12th grade  Occupational History   Occupation: Merchandiser, retail  Tobacco Use   Smoking status: Never    Passive exposure: Current   Smokeless tobacco: Never  Vaping Use   Vaping status: Never Used  Substance and Sexual  Activity   Alcohol use: Yes    Alcohol/week: 2.0 standard drinks of alcohol    Types: 2 Standard drinks or equivalent per week    Comment: occasionally - 2 out of a 7 day period   Drug use: No   Sexual activity: Yes    Birth control/protection: Surgical    Comment: Tubal ligation  Other Topics Concern   Not on file  Social History Narrative   Caffiene 1 cup coffee daily.    Lives home with spouse   Works at [employer]   Education 48yrs   Children one.    Social Drivers of Corporate investment banker Strain: Low Risk  (08/16/2023)   Overall Financial Resource Strain (CARDIA)    Difficulty of Paying Living Expenses: Not very hard  Food Insecurity: No Food Insecurity (08/20/2023)   Hunger Vital Sign    Worried About Running Out of Food in the Last Year: Never  true    Ran Out of Food in the Last Year: Never true  Transportation Needs: No Transportation Needs (08/20/2023)   PRAPARE - Administrator, Civil Service (Medical): No    Lack of Transportation (Non-Medical): No  Physical Activity: Insufficiently Active (08/16/2023)   Exercise Vital Sign    Days of Exercise per Week: 3 days    Minutes of Exercise per Session: 30 min  Stress: No Stress Concern Present (08/16/2023)   [employer] of Occupational Health - Occupational Stress Questionnaire    Feeling of Stress : Only a little  Recent Concern: Stress - Stress Concern Present (06/30/2023)   [employer] of Occupational Health - Occupational Stress Questionnaire    Feeling of Stress : To some extent  Social Connections: Unknown (08/16/2023)   Social Connection and Isolation Panel [NHANES]    Frequency of Communication with Friends and Family: More than three times a week    Frequency of Social Gatherings with Friends and Family: Twice a week    Attends Religious Services: Patient declined    Database administrator or Organizations: No    Attends Engineer, structural: Not on file    Marital  Status: Married  Catering manager Violence: Not At Risk (08/20/2023)   Humiliation, Afraid, Rape, and Kick questionnaire    Fear of Current or Ex-Partner: No    Emotionally Abused: No    Physically Abused: No    Sexually Abused: No    Family History  Problem Relation Age of Onset   Hypertension Mother    Hypertension Father    Colon cancer Father        dx 43s   Throat cancer Maternal Uncle    Lung cancer Paternal Uncle    Stroke Maternal Grandmother    Hypertension Maternal Grandmother    Aneurysm Maternal Grandmother    Healthy Son      Review of Systems  Constitutional: Negative.  Negative for chills and fever.  HENT: Negative.  Negative for congestion and sore throat.   Respiratory: Negative.  Negative for cough and shortness of breath.   Cardiovascular: Negative.  Negative for chest pain and palpitations.  Gastrointestinal:  Negative for abdominal pain, diarrhea, nausea and vomiting.  Genitourinary: Negative.  Negative for dysuria and hematuria.  Skin: Negative.  Negative for rash.  Neurological: Negative.  Negative for dizziness and headaches.  All other systems reviewed and are negative.   Vitals:   12/14/23 1017  BP: (!) 132/94  Pulse: 92  Temp: 98.4 F (36.9 C)  SpO2: 97%    Physical Exam Vitals reviewed.  Constitutional:      Appearance: Normal appearance.  HENT:     Head: Normocephalic.  Eyes:     Extraocular Movements: Extraocular movements intact.     Pupils: Pupils are equal, round, and reactive to light.  Cardiovascular:     Rate and Rhythm: Normal rate and regular rhythm.     Pulses: Normal pulses.     Heart sounds: Normal heart sounds.  Pulmonary:     Effort: Pulmonary effort is normal.     Breath sounds: Normal breath sounds.  Musculoskeletal:     Cervical back: No tenderness.     Comments: Right lower extremity: Has orthopedic walking boot on Left lower extremity: No significant swelling or tenderness.  No significant finding   Lymphadenopathy:     Cervical: No cervical adenopathy.  Skin:    General: Skin is warm and dry.  Capillary Refill: Capillary refill takes less than 2 seconds.  Neurological:     General: No focal deficit present.     Mental Status: She is alert and oriented to person, place, and time.  Psychiatric:        Mood and Affect: Mood normal.        Behavior: Behavior normal.      ASSESSMENT & PLAN: A total of 42 minutes was spent with the patient and counseling/coordination of care regarding preparing for this visit, review of most recent office visit notes, review of multiple chronic medical conditions and their management, review of all medications, review of most recent bloodwork results, review of health maintenance items, education on nutrition, prognosis, documentation, and need for follow up.   Problem List Items Addressed This Visit       Cardiovascular and Mediastinum   Essential hypertension (Chronic)   BP Readings from Last 3 Encounters:  12/14/23 (!) 132/94  11/13/23 (!) 156/88  10/01/23 (!) 148/85  Better control hypertension Continue amlodipine 5 mg daily and Hyzaar 100-12.5 mg daily Cardiovascular risks associated with hypertension discussed       Relevant Orders   Comprehensive metabolic panel with GFR   CBC with Differential/Platelet   Hot flashes   Relevant Orders   Comprehensive metabolic panel with GFR   Hemoglobin A1c   TSH   Vitamin B12   VITAMIN D 25 Hydroxy (Vit-D Deficiency, Fractures)     Other   Leg pain, diffuse, left - Primary   Leg pain and swelling but minimal physical findings Recommend leg ultrasound to rule out DVT However she is already on Eliquis 5 mg twice a day which is protective against thromboembolic events.      Relevant Orders   Comprehensive metabolic panel with GFR   CBC with Differential/Platelet   VAS [place] LOWER EXTREMITY VENOUS (DVT)   History of pulmonary embolism   Clinically stable.  Continues Eliquis 5 mg twice a  day      Relevant Orders   CBC with Differential/Platelet   VAS [place] LOWER EXTREMITY VENOUS (DVT)   Patient Instructions  Health Maintenance, Female Adopting a healthy lifestyle and getting preventive care are important in promoting health and wellness. Ask your health care provider about: The right schedule for you to have regular tests and exams. Things you can do on your own to prevent diseases and keep yourself healthy. What should I know about diet, weight, and exercise? Eat a healthy diet  Eat a diet that includes plenty of vegetables, fruits, low-fat dairy products, and lean protein. Do not eat a lot of foods that are high in solid fats, added sugars, or sodium. Maintain a healthy weight Body mass index (BMI) is used to identify weight problems. It estimates body fat based on height and weight. Your health care provider can help determine your BMI and help you achieve or maintain a healthy weight. Get regular exercise Get regular exercise. This is one of the most important things you can do for your health. Most adults should: Exercise for at least 150 minutes each week. The exercise should increase your heart rate and make you sweat (moderate-intensity exercise). Do strengthening exercises at least twice a week. This is in addition to the moderate-intensity exercise. Spend less time sitting. Even light physical activity can be beneficial. Watch cholesterol and blood lipids Have your blood tested for lipids and cholesterol at 52 years of age, then have this test every 5 years. Have your cholesterol levels checked  more often if: Your lipid or cholesterol levels are high. You are older than 53 years of age. You are at high risk for heart disease. What should I know about cancer screening? Depending on your health history and family history, you may need to have cancer screening at various ages. This may include screening for: Breast cancer. Cervical cancer. Colorectal  cancer. Skin cancer. Lung cancer. What should I know about heart disease, diabetes, and high blood pressure? Blood pressure and heart disease High blood pressure causes heart disease and increases the risk of stroke. This is more likely to develop in people who have high blood pressure readings or are overweight. Have your blood pressure checked: Every 3-5 years if you are 83-4 years of age. Every year if you are 63 years old or older. Diabetes Have regular diabetes screenings. This checks your fasting blood sugar level. Have the screening done: Once every three years after age 23 if you are at a normal weight and have a low risk for diabetes. More often and at a younger age if you are overweight or have a high risk for diabetes. What should I know about preventing infection? Hepatitis B If you have a higher risk for hepatitis B, you should be screened for this virus. Talk with your health care provider to find out if you are at risk for hepatitis B infection. Hepatitis C Testing is recommended for: Everyone born from [birth year] through 1965. Anyone with known risk factors for hepatitis C. Sexually transmitted infections (STIs) Get screened for STIs, including gonorrhea and chlamydia, if: You are sexually active and are younger than 53 years of age. You are older than 53 years of age and your health care provider tells you that you are at risk for this type of infection. Your sexual activity has changed since you were last screened, and you are at increased risk for chlamydia or gonorrhea. Ask your health care provider if you are at risk. Ask your health care provider about whether you are at high risk for HIV. Your health care provider may recommend a prescription medicine to help prevent HIV infection. If you choose to take medicine to prevent HIV, you should first get tested for HIV. You should then be tested every 3 months for as long as you are taking the medicine. Pregnancy If you are  about to stop having your period (premenopausal) and you may become pregnant, seek counseling before you get pregnant. Take 400 to 800 micrograms (mcg) of folic acid every day if you become pregnant. Ask for birth control (contraception) if you want to prevent pregnancy. Osteoporosis and menopause Osteoporosis is a disease in which the bones lose minerals and strength with aging. This can result in bone fractures. If you are 7 years old or older, or if you are at risk for osteoporosis and fractures, ask your health care provider if you should: Be screened for bone loss. Take a calcium or vitamin D supplement to lower your risk of fractures. Be given hormone replacement therapy (HRT) to treat symptoms of menopause. Follow these instructions at home: Alcohol use Do not drink alcohol if: Your health care provider tells you not to drink. You are pregnant, may be pregnant, or are planning to become pregnant. If you drink alcohol: Limit how much you have to: 0-1 drink a day. Know how much alcohol is in your drink. In the [place] one drink equals one 12 oz bottle of beer (355 mL), one 5 oz glass of wine (148  mL), or one 1 oz glass of hard liquor (44 mL). Lifestyle Do not use any products that contain nicotine or tobacco. These products include cigarettes, chewing tobacco, and vaping devices, such as e-cigarettes. If you need help quitting, ask your health care provider. Do not use street drugs. Do not share needles. Ask your health care provider for help if you need support or information about quitting drugs. General instructions Schedule regular health, dental, and eye exams. Stay current with your vaccines. Tell your health care provider if: You often feel depressed. You have ever been abused or do not feel safe at home. Summary Adopting a healthy lifestyle and getting preventive care are important in promoting health and wellness. Follow your health care provider's instructions about  healthy diet, exercising, and getting tested or screened for diseases. Follow your health care provider's instructions on monitoring your cholesterol and blood pressure. This information is not intended to replace advice given to you by your health care provider. Make sure you discuss any questions you have with your health care provider. Document Revised: 01/07/2021 Document Reviewed: 01/07/2021 Elsevier Patient Education  2024 Elsevier Inc.     Maryagnes Small, MD [facility]

## 2023-12-14 NOTE — Patient Instructions (Signed)

## 2023-12-14 NOTE — Assessment & Plan Note (Signed)
 Clinically stable.  Continues Eliquis 5 mg twice a day

## 2023-12-16 ENCOUNTER — Encounter (HOSPITAL_COMMUNITY): Payer: Self-pay

## 2023-12-16 ENCOUNTER — Emergency Department (HOSPITAL_COMMUNITY)

## 2023-12-16 ENCOUNTER — Other Ambulatory Visit (HOSPITAL_COMMUNITY): Payer: Self-pay

## 2023-12-16 ENCOUNTER — Emergency Department (HOSPITAL_COMMUNITY)
Admission: EM | Admit: 2023-12-16 | Discharge: 2023-12-16 | Disposition: A | Discharge status: Home / Self Care | Attending: Emergency Medicine | Admitting: Emergency Medicine

## 2023-12-16 ENCOUNTER — Other Ambulatory Visit: Payer: Self-pay

## 2023-12-16 DIAGNOSIS — N281 Cyst of kidney, acquired: Secondary | ICD-10-CM | POA: Insufficient documentation

## 2023-12-16 DIAGNOSIS — M25552 Pain in left hip: Secondary | ICD-10-CM | POA: Diagnosis not present

## 2023-12-16 DIAGNOSIS — M48061 Spinal stenosis, lumbar region without neurogenic claudication: Secondary | ICD-10-CM | POA: Diagnosis not present

## 2023-12-16 DIAGNOSIS — M545 Low back pain, unspecified: Secondary | ICD-10-CM | POA: Insufficient documentation

## 2023-12-16 DIAGNOSIS — M4316 Spondylolisthesis, lumbar region: Secondary | ICD-10-CM | POA: Diagnosis not present

## 2023-12-16 DIAGNOSIS — Z7901 Long term (current) use of anticoagulants: Secondary | ICD-10-CM | POA: Diagnosis not present

## 2023-12-16 DIAGNOSIS — R600 Localized edema: Secondary | ICD-10-CM | POA: Diagnosis not present

## 2023-12-16 DIAGNOSIS — K760 Fatty (change of) liver, not elsewhere classified: Secondary | ICD-10-CM | POA: Diagnosis not present

## 2023-12-16 DIAGNOSIS — R000 Tachycardia, unspecified: Secondary | ICD-10-CM | POA: Insufficient documentation

## 2023-12-16 DIAGNOSIS — M25551 Pain in right hip: Secondary | ICD-10-CM | POA: Insufficient documentation

## 2023-12-16 DIAGNOSIS — R0789 Other chest pain: Secondary | ICD-10-CM | POA: Diagnosis not present

## 2023-12-16 DIAGNOSIS — Z853 Personal history of malignant neoplasm of breast: Secondary | ICD-10-CM | POA: Insufficient documentation

## 2023-12-16 DIAGNOSIS — D259 Leiomyoma of uterus, unspecified: Secondary | ICD-10-CM | POA: Diagnosis not present

## 2023-12-16 DIAGNOSIS — R109 Unspecified abdominal pain: Secondary | ICD-10-CM | POA: Diagnosis not present

## 2023-12-16 DIAGNOSIS — R079 Chest pain, unspecified: Secondary | ICD-10-CM | POA: Diagnosis not present

## 2023-12-16 DIAGNOSIS — R0602 Shortness of breath: Secondary | ICD-10-CM | POA: Insufficient documentation

## 2023-12-16 DIAGNOSIS — I2699 Other pulmonary embolism without acute cor pulmonale: Secondary | ICD-10-CM | POA: Diagnosis not present

## 2023-12-16 DIAGNOSIS — K5730 Diverticulosis of large intestine without perforation or abscess without bleeding: Secondary | ICD-10-CM | POA: Diagnosis not present

## 2023-12-16 LAB — BLOOD GAS, VENOUS
Acid-Base Excess: 3.3 mmol/L — ABNORMAL HIGH (ref 0.0–2.0)
Bicarbonate: 23.1 mmol/L (ref 20.0–28.0)
O2 Saturation: 71.8 %
Patient temperature: 37
pCO2, Ven: 23 mmHg — ABNORMAL LOW (ref 44–60)
pH, Ven: 7.61 (ref 7.25–7.43)
pO2, Ven: 33 mmHg (ref 32–45)

## 2023-12-16 LAB — CBC
HCT: 43 % (ref 36.0–46.0)
Hemoglobin: 14.1 g/dL (ref 12.0–15.0)
MCH: 26.3 pg (ref 26.0–34.0)
MCHC: 32.8 g/dL (ref 30.0–36.0)
MCV: 80.1 fL (ref 80.0–100.0)
Platelets: 382 10*3/uL (ref 150–400)
RBC: 5.37 MIL/uL — ABNORMAL HIGH (ref 3.87–5.11)
RDW: 13.9 % (ref 11.5–15.5)
WBC: 10.8 10*3/uL — ABNORMAL HIGH (ref 4.0–10.5)
nRBC: 0 % (ref 0.0–0.2)

## 2023-12-16 LAB — MAGNESIUM: Magnesium: 1.9 mg/dL (ref 1.7–2.4)

## 2023-12-16 LAB — BASIC METABOLIC PANEL WITH GFR
Anion gap: 11 (ref 5–15)
BUN: 12 mg/dL (ref 6–20)
CO2: 23 mmol/L (ref 22–32)
Calcium: 9.5 mg/dL (ref 8.9–10.3)
Chloride: 104 mmol/L (ref 98–111)
Creatinine, Ser: 0.85 mg/dL (ref 0.44–1.00)
GFR, Estimated: >60 mL/min (ref >60)
Glucose, Bld: 107 mg/dL — ABNORMAL HIGH (ref 70–99)
Potassium: 3.4 mmol/L — ABNORMAL LOW (ref 3.5–5.1)
Sodium: 138 mmol/L (ref 135–145)

## 2023-12-16 LAB — TROPONIN I (HIGH SENSITIVITY)
Troponin I (High Sensitivity): 2 ng/L (ref <18)
Troponin I (High Sensitivity): 2 ng/L (ref <18)

## 2023-12-16 MED ORDER — LORAZEPAM 2 MG/ML IJ SOLN
0.5000 mg | Freq: Once | INTRAMUSCULAR | Status: AC
  Administered 2023-12-16: 0.5 mg via INTRAVENOUS
  Filled 2023-12-16: qty 1

## 2023-12-16 MED ORDER — FENTANYL CITRATE PF 50 MCG/ML IJ SOSY
50.0000 ug | PREFILLED_SYRINGE | Freq: Once | INTRAMUSCULAR | Status: AC
  Administered 2023-12-16: 50 ug via INTRAVENOUS
  Filled 2023-12-16: qty 1

## 2023-12-16 MED ORDER — OXYCODONE HCL 5 MG PO TABS
5.0000 mg | ORAL_TABLET | Freq: Four times a day (QID) | ORAL | 0 refills | Status: DC | PRN
  Filled 2023-12-16: qty 12, 3d supply, fill #0

## 2023-12-16 MED ORDER — METHYLPREDNISOLONE 4 MG PO TBPK
ORAL_TABLET | ORAL | 0 refills | Status: DC
  Filled 2023-12-16: qty 21, 6d supply, fill #0

## 2023-12-16 MED ORDER — OXYCODONE HCL 5 MG PO TABS
5.0000 mg | ORAL_TABLET | Freq: Once | ORAL | Status: AC
  Administered 2023-12-16: 5 mg via ORAL
  Filled 2023-12-16: qty 1

## 2023-12-16 MED ORDER — METHYLPREDNISOLONE SODIUM SUCC 125 MG IJ SOLR
125.0000 mg | Freq: Once | INTRAMUSCULAR | Status: AC
  Administered 2023-12-16: 125 mg via INTRAVENOUS
  Filled 2023-12-16: qty 2

## 2023-12-16 MED ORDER — IOHEXOL 350 MG/ML SOLN
100.0000 mL | Freq: Once | INTRAVENOUS | Status: AC | PRN
  Administered 2023-12-16: 100 mL via INTRAVENOUS

## 2023-12-16 NOTE — ED Notes (Signed)
 Patient back from MRI.

## 2023-12-16 NOTE — ED Notes (Signed)
 IV obtained in triage. Save blue top sent to lab. Pt taken back to room 5. Verbal report given to assigned RN

## 2023-12-16 NOTE — ED Provider Notes (Signed)
 [facility] AT [facility] Provider Note   CSN: [id number] Arrival date & time: 12/16/23  1478     History  Chief Complaint  Patient presents with   Chest Pain   Shortness of Breath    Erin Gallagher is a 53 y.o. female.  The history is provided by the patient, the spouse and medical records.  Chest Pain Associated symptoms: shortness of breath   Shortness of Breath Associated symptoms: chest pain   Erin Gallagher is a 53 y.o. female who presents to the Emergency Department complaining of difficulty breathing and hip pain and she present to the emergency department accompanied by her husband for evaluation of shortness of breath and headache that occurred just prior to ED arrival.  She complains of central chest pain as well.  She has been experiencing bilateral hip pain for about the last week, right greater than left.  No reported injuries or falls.  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  She also has a remote history of breast cancer on Arimidex as well as hypertension.  She did have surgery on her right lower extremity in January.  She has chronic right lower extremity edema.  No reported fevers. No hematochezia or melena.    Home Medications Prior to Admission medications   Medication Sig Start Date End Date Taking? Authorizing Provider  methylPREDNISolone (MEDROL DOSEPAK) 4 MG TBPK tablet Take according to label instructions 12/16/23  Yes Kelsey Patricia, MD  oxyCODONE (ROXICODONE) 5 MG immediate release tablet Take 1 tablet (5 mg total) by mouth every 6 (six) hours as needed for severe pain (pain score 7-10). 12/16/23  Yes Kelsey Patricia, MD  albuterol (VENTOLIN HFA) 108 (90 Base) MCG/ACT inhaler Inhale 2 puffs into the lungs 2 (two) times daily for 5 days. Patient taking differently: Inhale 2 puffs into the lungs 2 (two) times daily as needed for wheezing or shortness of breath. 08/16/23 09/06/23  Elvira Hammersmith, MD  amLODipine  (NORVASC) 5 MG tablet Take 1 tablet (5 mg total) by mouth daily. 03/11/23   Elvira Hammersmith, MD  anastrozole (ARIMIDEX) 1 MG tablet Take 1 tablet (1 mg total) by mouth daily. 09/30/23   Percival Brace, NP  apixaban (ELIQUIS) 5 MG TABS tablet Take 1 tablet (5 mg total) by mouth 2 (two) times daily. 09/16/23   Percival Brace, NP  cyclobenzaprine (FLEXERIL) 10 MG tablet Take 1 tablet (10 mg total) by mouth at bedtime. Patient taking differently: Take 10 mg by mouth at bedtime as needed for muscle spasms. 07/01/23   Elvira Hammersmith, MD  docusate sodium (COLACE) 100 MG capsule Take 1 capsule (100 mg total) by mouth 2 (two) times daily. While taking narcotic pain medicine. 10/01/23   Debbra Fairy, PA-C  HYDROcodone-acetaminophen (NORCO/VICODIN) 5-325 MG tablet Take 1 tablet by mouth every 6 (six) hours as needed for 5 days 10/09/23     losartan-hydrochlorothiazide (HYZAAR) 100-12.5 MG tablet Take 1 tablet by mouth daily. 03/11/23   Elvira Hammersmith, MD  nitroGLYCERIN (NITROSTAT) 0.4 MG SL tablet Place 1 tablet (0.4 mg total) under the tongue every 5 (five) minutes as needed for chest pain. 02/02/23   Arleen Lacer, MD  senna (SENOKOT) 8.6 MG TABS tablet Take 2 tablets (17.2 mg total) by mouth 2 (two) times daily. 10/01/23   Debbra Fairy, PA-C  TYLENOL 500 MG tablet Take 500-1,000 mg by mouth every 6 (six) hours as needed for mild  pain (pain score 1-3) or headache.    [provider]  VITAMIN D PO Take 1,000 Units by mouth daily.    [provider]      Allergies    Other    Review of Systems   Review of Systems  Respiratory:  Positive for shortness of breath.   Cardiovascular:  Positive for chest pain.  All other systems reviewed and are negative.   Physical Exam Updated Vital Signs BP 132/87 (BP Location: Left Arm)   Pulse 83   Temp 98.2 F (36.8 C) (Oral)   Resp 20   LMP 12/30/2021   SpO2 95%  Physical Exam Vitals and nursing  note reviewed.  Constitutional:      General: She is in acute distress.     Appearance: She is well-developed. She is ill-appearing.  HENT:     Head: Normocephalic and atraumatic.  Cardiovascular:     Rate and Rhythm: Regular rhythm. Tachycardia present.     Heart sounds: No murmur heard. Pulmonary:     Effort: Pulmonary effort is normal.     Comments: Tachypnea.  Moaning obscures breath sounds bilaterally Abdominal:     Palpations: Abdomen is soft.     Tenderness: There is no abdominal tenderness. There is no guarding or rebound.  Musculoskeletal:        General: No tenderness.     Comments: Nonpitting edema to right lower extremity.  2+ DP pulses bilaterally.  There is tenderness to palpation on the hips bilaterally.  Skin:    General: Skin is warm and dry.     Coloration: Skin is pale.  Neurological:     Mental Status: She is alert and oriented to person, place, and time.     Comments: 4+ out of 5 strength in bilateral lower extremities although pain does limit strength testing.  Sensation to light touch intact in bilateral lower extremities  Psychiatric:     Comments: Anxious and tearful     ED Results / Procedures / Treatments   Labs (all labs ordered are listed, but only abnormal results are displayed) Labs Reviewed  BASIC METABOLIC PANEL WITH GFR - Abnormal; Notable for the following components:      Result Value   Potassium 3.4 (*)    Glucose, Bld 107 (*)    All other components within normal limits  CBC - Abnormal; Notable for the following components:   WBC 10.8 (*)    RBC 5.37 (*)    All other components within normal limits  BLOOD GAS, VENOUS - Abnormal; Notable for the following components:   pH, Ven 7.61 (*)    pCO2, Ven 23 (*)    Acid-Base Excess 3.3 (*)    All other components within normal limits  MAGNESIUM  TROPONIN I (HIGH SENSITIVITY)  TROPONIN I (HIGH SENSITIVITY)    EKG None  Radiology MR LUMBAR SPINE WO CONTRAST Result Date:  12/16/2023 CLINICAL DATA:  Acute lumbar myelopathy. EXAM: MRI LUMBAR SPINE WITHOUT CONTRAST TECHNIQUE: Multiplanar, multisequence MR imaging of the lumbar spine was performed. No intravenous contrast was administered. COMPARISON:  07/26/2023 FINDINGS: Segmentation:  Standard. Alignment: Straightening of lumbar lordosis with mild anterolisthesis at L2-3 and retrolisthesis at L3-4. Vertebrae:  No fracture, evidence of discitis, or bone lesion. Conus medullaris and cauda equina: Conus extends to the L1 level. Conus and cauda equina appear normal. Paraspinal and other soft tissues: Exophytic uterine fibroid. No perispinal mass or inflammation. Sigmoid diverticulosis. Small hypointensity in the right kidney as evaluated  by recent abdominal CT with contrast. Disc levels: T12- L1: Minor facet spurring L1-L2: Mild facet spurring with tiny medial synovial cyst on the left. L2-L3: Degenerative facet spurring with mild anterolisthesis. Mild disc height loss and bulging. L3-L4: Disc narrowing and bulging with mild endplate spurring. Mild facet spurring eccentric to the right with mild retrolisthesis. L4-L5: Greatest level of degenerative disc narrowing with circumferential disc bulging. Degenerative facet spurring greater on the left where there is mild to moderate foraminal narrowing. Mild left subarticular recess narrowing without definite L5 impingement. L5-S1:Degenerative facet spurring, fairly bulky with medial spur on the right contacting the descending right S1 nerve root. Preserved disc height and hydration. No neural compression. IMPRESSION: Generalized lumbar spine degeneration without change from 07/26/2023, as above. Mild to moderate foraminal narrowing on the left at L4-5. Mild left subarticular recess narrowing at the same level. Electronically Signed   By: Ronnette Coke M.D.   On: 12/16/2023 06:44   CT Angio Chest PE W/Cm &/Or Wo Cm Result Date: 12/16/2023 CLINICAL DATA:  Chest pain, shortness of breath and  leukocytosis. History of left breast cancer with lumpectomy and XRT, cholecystectomy. Also with abdominal pain and severe low back pain. Concern is for pulmonary embolism or retroperitoneal bleed. EXAM: CT ANGIOGRAPHY CHEST CT ABDOMEN AND PELVIS WITH CONTRAST TECHNIQUE: Multidetector CT imaging of the chest was performed using the standard protocol during bolus administration of intravenous contrast. Multiplanar CT image reconstructions and MIPs were obtained to evaluate the vascular anatomy. Multidetector CT imaging of the abdomen and pelvis was performed using the standard protocol during bolus administration of intravenous contrast. RADIATION DOSE REDUCTION: This exam was performed according to the departmental dose-optimization program which includes automated exposure control, adjustment of the mA and/or kV according to patient size and/or use of iterative reconstruction technique. CONTRAST:  OMNIPAQUE IOHEXOL 350 MG/ML SOLN COMPARISON:  Portable chest today, portable chest 08/19/2023, CTA chest 08/19/2023, CT abdomen only 04/13/2023 without contrast, CT abdomen pelvis with contrast 09/02/2010. FINDINGS: CTA CHEST FINDINGS Cardiovascular: The pulmonary arteries have returned to a normal caliber. There are no longer findings of right heart strain. There previously was a sizable saddle thrombus extending into the bilateral lobar, segmental and subsegmental pulmonary arteries. On today's exam a linear nonocclusive wispy thrombus which is probably chronic and part of the old embolism, stretches across the pulmonary bifurcation with extension a short distance into the left upper lobe main artery. Additional linear thrombus, also nonoccluding and also likely chronic, is seen than 2 right lower lobe segmental arteries, the right upper lobe main artery and the interlobar main artery to the right middle lobe. No downstream emboli are seen. The heart is slightly enlarged. There is no pericardial effusion, no  visible coronary calcifications. The aorta, great vessels and pulmonary veins are normal. Mediastinum/Nodes: No enlarged mediastinal, hilar, or axillary lymph nodes. Thyroid gland, trachea, and esophagus demonstrate no significant findings. There is a small hiatal hernia. Lungs/Pleura: Lungs are clear. No pleural effusion or pneumothorax. Musculoskeletal: Slight thoracic dextroscoliosis. Mild thoracic spondylosis. No acute or significant osseous findings. No visible regional bone metastases. There are postsurgical changes and scar-like opacity in the left breast, surgical clips left axilla. Review of the MIP images confirms the above findings. CT ABDOMEN and PELVIS FINDINGS Hepatobiliary: Moderate-to-marked hepatic steatosis continues to be seen. There is no mass enhancement. Gallbladder is absent without biliary dilatation. Pancreas: No abnormality. Spleen: No abnormality.  No splenomegaly. Adrenals/Urinary Tract: Adrenal glands are unremarkable. No abnormality is seen of the left kidney.  In the posterior right kidney, there is a 1.4 cm low-density lesion with Hounsfield density of 67 above the range of fluid. This was seen in 2012 measuring 9 mm at that time and is probably a proteinaceous or hemorrhagic cyst. MRI without and with contrast is recommended to confirm a benign process. Remainder of the kidneys are unremarkable. There is no evidence of urinary stones or obstruction. The ureters are small in caliber. There is symmetric excretion on the delayed images. The bladder is unremarkable the degree of distension and allowing for pelvic streak artifacts. Stomach/Bowel: No dilatation or wall thickening including the appendix. Sigmoid diverticulosis noted without evidence of acute diverticulitis. Vascular/Lymphatic: No significant vascular findings are present. No enlarged lymph nodes. Scattered pelvic phleboliths. Reproductive: Lobular enlarged fibroid uterus with exophytic and transmural fibroids, largest is 4.6  cm in the left anteriorly. No adnexal mass. Other: No free fluid, free hemorrhage or free air. There is an umbilical fat hernia but no incarcerated hernia. 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 There is bilateral chronic sacroiliitis without erosive component. There is mild hip DJD. No acute or significant osseous findings are otherwise seen. Review of the MIP images confirms the above findings. IMPRESSION: 1. The pulmonary arteries have returned to a normal caliber. There are no longer findings of right heart strain. 2. There is linear nonocclusive wispy thrombus extending across the pulmonary bifurcation, into the left upper lobe main artery, into the right upper lobe and right middle lobe main arteries, and in 2 segmental right lower lobe arteries all probably chronic. No downstream emboli are seen. 3. No other significant or acute chest findings. 4. Small hiatal hernia. 5. Moderate-to-marked hepatic steatosis. 6. 1.4 cm low-density lesion in the posterior right kidney, increased from 9 mm in 2012, probably a proteinaceous or hemorrhagic cyst. MRI without and with contrast is recommended to confirm a benign process. 7. Fibroid uterus. 8. Umbilical fat hernia. 9. Chronic sacroiliitis. Electronically Signed   By: Denman Fischer M.D.   On: 12/16/2023 04:13   CT ABDOMEN PELVIS W CONTRAST Result Date: 12/16/2023 CLINICAL DATA:  Chest pain, shortness of breath and leukocytosis. History of left breast cancer with lumpectomy and XRT, cholecystectomy. Also with abdominal pain and severe low back pain. Concern is for pulmonary embolism or retroperitoneal bleed. EXAM: CT ANGIOGRAPHY CHEST CT ABDOMEN AND PELVIS WITH CONTRAST TECHNIQUE: Multidetector CT imaging of the chest was performed using the standard protocol during bolus administration of intravenous contrast. Multiplanar CT image reconstructions and MIPs were obtained to evaluate the  vascular anatomy. Multidetector CT imaging of the abdomen and pelvis was performed using the standard protocol during bolus administration of intravenous contrast. RADIATION DOSE REDUCTION: This exam was performed according to the departmental dose-optimization program which includes automated exposure control, adjustment of the mA and/or kV according to patient size and/or use of iterative reconstruction technique. CONTRAST:  OMNIPAQUE IOHEXOL 350 MG/ML SOLN COMPARISON:  Portable chest today, portable chest 08/19/2023, CTA chest 08/19/2023, CT abdomen only 04/13/2023 without contrast, CT abdomen pelvis with contrast 09/02/2010. FINDINGS: CTA CHEST FINDINGS Cardiovascular: The pulmonary arteries have returned to a normal caliber. There are no longer findings of right heart strain. There previously was a sizable saddle thrombus extending into the bilateral lobar, segmental and subsegmental pulmonary arteries. On today's exam a linear nonocclusive wispy thrombus which is probably chronic and part of the old embolism, stretches across the pulmonary bifurcation with extension a short distance into the left  upper lobe main artery. Additional linear thrombus, also nonoccluding and also likely chronic, is seen than 2 right lower lobe segmental arteries, the right upper lobe main artery and the interlobar main artery to the right middle lobe. No downstream emboli are seen. The heart is slightly enlarged. There is no pericardial effusion, no visible coronary calcifications. The aorta, great vessels and pulmonary veins are normal. Mediastinum/Nodes: No enlarged mediastinal, hilar, or axillary lymph nodes. Thyroid gland, trachea, and esophagus demonstrate no significant findings. There is a small hiatal hernia. Lungs/Pleura: Lungs are clear. No pleural effusion or pneumothorax. Musculoskeletal: Slight thoracic dextroscoliosis. Mild thoracic spondylosis. No acute or significant osseous findings. No visible regional bone  metastases. There are postsurgical changes and scar-like opacity in the left breast, surgical clips left axilla. Review of the MIP images confirms the above findings. CT ABDOMEN and PELVIS FINDINGS Hepatobiliary: Moderate-to-marked hepatic steatosis continues to be seen. There is no mass enhancement. Gallbladder is absent without biliary dilatation. Pancreas: No abnormality. Spleen: No abnormality.  No splenomegaly. Adrenals/Urinary Tract: Adrenal glands are unremarkable. No abnormality is seen of the left kidney. In the posterior right kidney, there is a 1.4 cm low-density lesion with Hounsfield density of 67 above the range of fluid. This was seen in 2012 measuring 9 mm at that time and is probably a proteinaceous or hemorrhagic cyst. MRI without and with contrast is recommended to confirm a benign process. Remainder of the kidneys are unremarkable. There is no evidence of urinary stones or obstruction. The ureters are small in caliber. There is symmetric excretion on the delayed images. The bladder is unremarkable the degree of distension and allowing for pelvic streak artifacts. Stomach/Bowel: No dilatation or wall thickening including the appendix. Sigmoid diverticulosis noted without evidence of acute diverticulitis. Vascular/Lymphatic: No significant vascular findings are present. No enlarged lymph nodes. Scattered pelvic phleboliths. Reproductive: Lobular enlarged fibroid uterus with exophytic and transmural fibroids, largest is 4.6 cm in the left anteriorly. No adnexal mass. Other: No free fluid, free hemorrhage or free air. There is an umbilical fat hernia but no incarcerated hernia. 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 There is bilateral chronic sacroiliitis without erosive component. There is mild hip DJD. No acute or significant osseous findings are otherwise seen. Review of the MIP images confirms the above findings.  IMPRESSION: 1. The pulmonary arteries have returned to a normal caliber. There are no longer findings of right heart strain. 2. There is linear nonocclusive wispy thrombus extending across the pulmonary bifurcation, into the left upper lobe main artery, into the right upper lobe and right middle lobe main arteries, and in 2 segmental right lower lobe arteries all probably chronic. No downstream emboli are seen. 3. No other significant or acute chest findings. 4. Small hiatal hernia. 5. Moderate-to-marked hepatic steatosis. 6. 1.4 cm low-density lesion in the posterior right kidney, increased from 9 mm in 2012, probably a proteinaceous or hemorrhagic cyst. MRI without and with contrast is recommended to confirm a benign process. 7. Fibroid uterus. 8. Umbilical fat hernia. 9. Chronic sacroiliitis. Electronically Signed   By: Denman Fischer M.D.   On: 12/16/2023 04:13   DG Chest Portable 1 View Result Date: 12/16/2023 CLINICAL DATA:  Chest pain and shortness of breath for 1 day EXAM: PORTABLE CHEST 1 VIEW COMPARISON:  08/19/2023 FINDINGS: The heart size and mediastinal contours are within normal limits. Both lungs are clear. The visualized skeletal structures are unremarkable. IMPRESSION: No active disease. Electronically Signed  By: Violeta Grey M.D.   On: 12/16/2023 00:54    Procedures Procedures    Medications Ordered in ED Medications  methylPREDNISolone sodium succinate (SOLU-MEDROL) 125 mg/2 mL injection 125 mg (has no administration in time range)  fentaNYL (SUBLIMAZE) injection 50 mcg (50 mcg Intravenous Given 12/16/23 0054)  iohexol (OMNIPAQUE) 350 MG/ML injection 100 mL (100 mLs Intravenous Contrast Given 12/16/23 0247)  fentaNYL (SUBLIMAZE) injection 50 mcg (50 mcg Intravenous Given 12/16/23 0403)  oxyCODONE (Oxy IR/ROXICODONE) immediate release tablet 5 mg (5 mg Oral Given 12/16/23 0545)  LORazepam (ATIVAN) injection 0.5 mg (0.5 mg Intravenous Given 12/16/23 0546)    ED Course/ Medical  Decision Making/ A&P                                 Medical Decision Making Amount and/or Complexity of Data Reviewed Labs: ordered. Radiology: ordered.  Risk Prescription drug management.   Patient with history of PE on anticoagulation, breast cancer in remission here for evaluation of bilateral hip pain, difficulty breathing.  Patient tachypneic, tachycardic and anxious at time of ED arrival.  VBG is consistent with respiratory alkalosis, likely secondary to pain and anxiety.  She was treated with pain medications with significant improvement in her respiratory examination.  On repeat assessment after medication she is able to provide more clear history.  She has been experiencing progressive low back pain radiating to bilateral hips over the last several weeks but particularly severe over the last week.  Is making it difficult for her to ambulate secondary to her pain.  CTA does demonstrate chronic pulmonary embolism, no evidence of acute process.  CT abdomen pelvis without any significant retroperitoneal process to contribute to her pain.  Plan to obtain MRI given degree of pain, difficult to ascertain if she is truly weak.  Will provide additional medications for pain, anxiety.  MRI does demonstrate degenerative changes.  No evidence of acute cord compression.  She does have a history of ANA positive status, question if there is an inflammatory process contributing to her bilateral hip pain.  Will provide course of steroids to see if she has benefit.  Will also prescribe pain medications.  Presentation is not consistent with septic arthritis or gouty arthritis.  Her respiratory symptoms significantly improved after pain control as well as anxiety medication.  Feel she is stable for discharge home with outpatient follow-up and return precautions.  Discussed incidental findings on CT scan including hepatic steatosis and renal cyst.       Final Clinical Impression(s) / ED Diagnoses Final  diagnoses:  Acute midline low back pain without sciatica  Bilateral hip pain  Shortness of breath  Hepatic steatosis  Renal cyst    Rx / DC Orders ED Discharge Orders          Ordered    methylPREDNISolone (MEDROL DOSEPAK) 4 MG TBPK tablet        12/16/23 0652    oxyCODONE (ROXICODONE) 5 MG immediate release tablet  Every 6 hours PRN        12/16/23 0659              Kelsey Patricia, MD 12/16/23 [phone]

## 2023-12-16 NOTE — ED Notes (Signed)
 Patient transported to MRI

## 2023-12-16 NOTE — ED Triage Notes (Signed)
 Pt arrives via POV accompanied by s/o. Reports not feeling well over the past few weeks. Worsening CP and SOB yesterday - took tylenol in hopes to get better. Today pt has sudden worsening of centralized CP and SOB. Was diaphoretic on arrival improved throughout triage and 2 L nasal cannula. No hypoxia. Has RR 20-30s.   Hx PE - on Eliquis. Has been complaint

## 2023-12-16 NOTE — ED Notes (Signed)
 Patient assisted to bathroom with wheelchair. Need 2-assist. Patient in a lot of pain. States that movement makes it worst.

## 2023-12-25 ENCOUNTER — Other Ambulatory Visit (HOSPITAL_COMMUNITY): Payer: Self-pay

## 2023-12-27 ENCOUNTER — Encounter: Payer: Self-pay | Admitting: Emergency Medicine

## 2024-01-11 ENCOUNTER — Other Ambulatory Visit (HOSPITAL_COMMUNITY): Payer: Self-pay

## 2024-01-11 DIAGNOSIS — M67961 Unspecified disorder of synovium and tendon, right lower leg: Secondary | ICD-10-CM | POA: Diagnosis not present

## 2024-01-11 DIAGNOSIS — M67962 Unspecified disorder of synovium and tendon, left lower leg: Secondary | ICD-10-CM | POA: Diagnosis not present

## 2024-01-11 MED ORDER — GABAPENTIN 100 MG PO CAPS
100.0000 mg | ORAL_CAPSULE | Freq: Every day | ORAL | 0 refills | Status: AC
  Filled 2024-01-11: qty 90, 90d supply, fill #0

## 2024-01-11 MED ORDER — PREDNISONE 10 MG PO TABS
10.0000 mg | ORAL_TABLET | Freq: Every day | ORAL | 0 refills | Status: DC
  Filled 2024-01-11: qty 14, 14d supply, fill #0

## 2024-01-12 DIAGNOSIS — M67962 Unspecified disorder of synovium and tendon, left lower leg: Secondary | ICD-10-CM | POA: Diagnosis not present

## 2024-01-12 DIAGNOSIS — M67961 Unspecified disorder of synovium and tendon, right lower leg: Secondary | ICD-10-CM | POA: Diagnosis not present

## 2024-01-18 DIAGNOSIS — M67962 Unspecified disorder of synovium and tendon, left lower leg: Secondary | ICD-10-CM | POA: Diagnosis not present

## 2024-01-18 DIAGNOSIS — M67961 Unspecified disorder of synovium and tendon, right lower leg: Secondary | ICD-10-CM | POA: Diagnosis not present

## 2024-01-19 ENCOUNTER — Ambulatory Visit: Payer: Self-pay | Admitting: Emergency Medicine

## 2024-01-19 ENCOUNTER — Ambulatory Visit (HOSPITAL_BASED_OUTPATIENT_CLINIC_OR_DEPARTMENT_OTHER)

## 2024-01-19 DIAGNOSIS — Z86711 Personal history of pulmonary embolism: Secondary | ICD-10-CM | POA: Diagnosis not present

## 2024-01-19 DIAGNOSIS — M79605 Pain in left leg: Secondary | ICD-10-CM | POA: Diagnosis not present

## 2024-01-22 DIAGNOSIS — M67962 Unspecified disorder of synovium and tendon, left lower leg: Secondary | ICD-10-CM | POA: Diagnosis not present

## 2024-01-22 DIAGNOSIS — M67961 Unspecified disorder of synovium and tendon, right lower leg: Secondary | ICD-10-CM | POA: Diagnosis not present

## 2024-01-28 DIAGNOSIS — M67961 Unspecified disorder of synovium and tendon, right lower leg: Secondary | ICD-10-CM | POA: Diagnosis not present

## 2024-01-28 DIAGNOSIS — M67962 Unspecified disorder of synovium and tendon, left lower leg: Secondary | ICD-10-CM | POA: Diagnosis not present

## 2024-02-01 DIAGNOSIS — G4733 Obstructive sleep apnea (adult) (pediatric): Secondary | ICD-10-CM | POA: Diagnosis not present

## 2024-02-02 ENCOUNTER — Ambulatory Visit (HOSPITAL_BASED_OUTPATIENT_CLINIC_OR_DEPARTMENT_OTHER)
Admission: RE | Admit: 2024-02-02 | Discharge: 2024-02-02 | Disposition: A | Discharge status: Home / Self Care | Source: Ambulatory Visit | Attending: Adult Health | Admitting: Adult Health

## 2024-02-02 DIAGNOSIS — C50412 Malignant neoplasm of upper-outer quadrant of left female breast: Secondary | ICD-10-CM | POA: Diagnosis not present

## 2024-02-02 DIAGNOSIS — Z79811 Long term (current) use of aromatase inhibitors: Secondary | ICD-10-CM | POA: Diagnosis not present

## 2024-02-02 DIAGNOSIS — Z780 Asymptomatic menopausal state: Secondary | ICD-10-CM | POA: Diagnosis not present

## 2024-02-02 DIAGNOSIS — Z170 Estrogen receptor positive status [ER+]: Secondary | ICD-10-CM | POA: Insufficient documentation

## 2024-02-03 ENCOUNTER — Ambulatory Visit: Payer: Self-pay

## 2024-02-03 NOTE — Telephone Encounter (Signed)
 Called pt with results and recommendation per NP. She verbalized thanks and understanding.

## 2024-02-03 NOTE — Telephone Encounter (Signed)
-----   Message from Conway Dennis sent at 02/03/2024  8:50 AM EDT ----- Please call patient and let her know great news that bone density is normal.  I recommend repeating in 2 years time :) ----- Message ----- From: Interface, Rad Results In Sent: 02/02/2024   4:29 PM EDT To: Percival Brace, NP

## 2024-02-04 DIAGNOSIS — M67961 Unspecified disorder of synovium and tendon, right lower leg: Secondary | ICD-10-CM | POA: Diagnosis not present

## 2024-02-04 DIAGNOSIS — M67962 Unspecified disorder of synovium and tendon, left lower leg: Secondary | ICD-10-CM | POA: Diagnosis not present

## 2024-02-10 DIAGNOSIS — M67961 Unspecified disorder of synovium and tendon, right lower leg: Secondary | ICD-10-CM | POA: Diagnosis not present

## 2024-02-10 DIAGNOSIS — M67962 Unspecified disorder of synovium and tendon, left lower leg: Secondary | ICD-10-CM | POA: Diagnosis not present

## 2024-02-16 DIAGNOSIS — M67961 Unspecified disorder of synovium and tendon, right lower leg: Secondary | ICD-10-CM | POA: Diagnosis not present

## 2024-02-16 DIAGNOSIS — M67962 Unspecified disorder of synovium and tendon, left lower leg: Secondary | ICD-10-CM | POA: Diagnosis not present

## 2024-02-22 DIAGNOSIS — M67961 Unspecified disorder of synovium and tendon, right lower leg: Secondary | ICD-10-CM | POA: Diagnosis not present

## 2024-02-22 DIAGNOSIS — M67962 Unspecified disorder of synovium and tendon, left lower leg: Secondary | ICD-10-CM | POA: Diagnosis not present

## 2024-02-25 DIAGNOSIS — M67962 Unspecified disorder of synovium and tendon, left lower leg: Secondary | ICD-10-CM | POA: Diagnosis not present

## 2024-02-25 DIAGNOSIS — M67961 Unspecified disorder of synovium and tendon, right lower leg: Secondary | ICD-10-CM | POA: Diagnosis not present

## 2024-03-01 DIAGNOSIS — M67962 Unspecified disorder of synovium and tendon, left lower leg: Secondary | ICD-10-CM | POA: Diagnosis not present

## 2024-03-01 DIAGNOSIS — M67961 Unspecified disorder of synovium and tendon, right lower leg: Secondary | ICD-10-CM | POA: Diagnosis not present

## 2024-03-07 DIAGNOSIS — M67962 Unspecified disorder of synovium and tendon, left lower leg: Secondary | ICD-10-CM | POA: Diagnosis not present

## 2024-03-07 DIAGNOSIS — M67961 Unspecified disorder of synovium and tendon, right lower leg: Secondary | ICD-10-CM | POA: Diagnosis not present

## 2024-03-10 DIAGNOSIS — M67962 Unspecified disorder of synovium and tendon, left lower leg: Secondary | ICD-10-CM | POA: Diagnosis not present

## 2024-03-10 DIAGNOSIS — M67961 Unspecified disorder of synovium and tendon, right lower leg: Secondary | ICD-10-CM | POA: Diagnosis not present

## 2024-03-16 ENCOUNTER — Encounter: Payer: Self-pay | Admitting: Cardiology

## 2024-03-17 ENCOUNTER — Encounter: Payer: Self-pay | Admitting: Emergency Medicine

## 2024-03-18 NOTE — Telephone Encounter (Signed)
 If his pain is worse with coughing or movements, probably musculoskeletal.  Would be fine to take ibuprofen, Aleve  or Tylenol .  If she is taking Eliquis  still, probably best use Tylenol  or potentially like Ultram .  I agree with talking to PCP.  She had a pretty conclusively normal coronary CT scan not that long ago.  Alm Clay, MD

## 2024-03-21 ENCOUNTER — Other Ambulatory Visit (HOSPITAL_COMMUNITY): Payer: Self-pay

## 2024-03-21 ENCOUNTER — Other Ambulatory Visit: Payer: Self-pay

## 2024-03-21 DIAGNOSIS — I10 Essential (primary) hypertension: Secondary | ICD-10-CM

## 2024-03-21 DIAGNOSIS — M67961 Unspecified disorder of synovium and tendon, right lower leg: Secondary | ICD-10-CM | POA: Diagnosis not present

## 2024-03-21 DIAGNOSIS — M67962 Unspecified disorder of synovium and tendon, left lower leg: Secondary | ICD-10-CM | POA: Diagnosis not present

## 2024-03-21 MED ORDER — LOSARTAN POTASSIUM-HCTZ 100-12.5 MG PO TABS
1.0000 | ORAL_TABLET | Freq: Every day | ORAL | 3 refills | Status: AC
  Filled 2024-03-21: qty 90, 90d supply, fill #0
  Filled 2024-07-22: qty 90, 90d supply, fill #1

## 2024-03-21 MED ORDER — AMLODIPINE BESYLATE 5 MG PO TABS
5.0000 mg | ORAL_TABLET | Freq: Every day | ORAL | 3 refills | Status: AC
  Filled 2024-03-21: qty 90, 90d supply, fill #0
  Filled 2024-07-22: qty 90, 90d supply, fill #1

## 2024-03-21 NOTE — Telephone Encounter (Signed)
Tylenol.

## 2024-03-25 DIAGNOSIS — M67962 Unspecified disorder of synovium and tendon, left lower leg: Secondary | ICD-10-CM | POA: Diagnosis not present

## 2024-03-25 DIAGNOSIS — M67961 Unspecified disorder of synovium and tendon, right lower leg: Secondary | ICD-10-CM | POA: Diagnosis not present

## 2024-03-28 ENCOUNTER — Other Ambulatory Visit: Payer: Self-pay

## 2024-03-28 DIAGNOSIS — C50412 Malignant neoplasm of upper-outer quadrant of left female breast: Secondary | ICD-10-CM

## 2024-03-29 ENCOUNTER — Inpatient Hospital Stay (HOSPITAL_BASED_OUTPATIENT_CLINIC_OR_DEPARTMENT_OTHER): Payer: 59 | Admitting: Adult Health

## 2024-03-29 ENCOUNTER — Encounter: Payer: Self-pay | Admitting: Adult Health

## 2024-03-29 ENCOUNTER — Inpatient Hospital Stay: Payer: 59 | Attending: Adult Health

## 2024-03-29 VITALS — BP 155/90 | HR 77 | Temp 98.3°F | Resp 18 | Ht 65.0 in | Wt 265.0 lb

## 2024-03-29 DIAGNOSIS — Z79811 Long term (current) use of aromatase inhibitors: Secondary | ICD-10-CM | POA: Diagnosis not present

## 2024-03-29 DIAGNOSIS — Z801 Family history of malignant neoplasm of trachea, bronchus and lung: Secondary | ICD-10-CM | POA: Insufficient documentation

## 2024-03-29 DIAGNOSIS — Z86711 Personal history of pulmonary embolism: Secondary | ICD-10-CM | POA: Diagnosis not present

## 2024-03-29 DIAGNOSIS — Z1721 Progesterone receptor positive status: Secondary | ICD-10-CM | POA: Insufficient documentation

## 2024-03-29 DIAGNOSIS — Z780 Asymptomatic menopausal state: Secondary | ICD-10-CM | POA: Diagnosis not present

## 2024-03-29 DIAGNOSIS — Z923 Personal history of irradiation: Secondary | ICD-10-CM | POA: Diagnosis not present

## 2024-03-29 DIAGNOSIS — Z800 Family history of malignant neoplasm of digestive organs: Secondary | ICD-10-CM | POA: Insufficient documentation

## 2024-03-29 DIAGNOSIS — Z170 Estrogen receptor positive status [ER+]: Secondary | ICD-10-CM | POA: Insufficient documentation

## 2024-03-29 DIAGNOSIS — Z803 Family history of malignant neoplasm of breast: Secondary | ICD-10-CM | POA: Insufficient documentation

## 2024-03-29 DIAGNOSIS — N939 Abnormal uterine and vaginal bleeding, unspecified: Secondary | ICD-10-CM | POA: Insufficient documentation

## 2024-03-29 DIAGNOSIS — C50412 Malignant neoplasm of upper-outer quadrant of left female breast: Secondary | ICD-10-CM

## 2024-03-29 DIAGNOSIS — Z7901 Long term (current) use of anticoagulants: Secondary | ICD-10-CM | POA: Diagnosis not present

## 2024-03-29 DIAGNOSIS — Z1732 Human epidermal growth factor receptor 2 negative status: Secondary | ICD-10-CM | POA: Diagnosis not present

## 2024-03-29 LAB — CMP (CANCER CENTER ONLY)
ALT: 30 U/L (ref 0–44)
AST: 26 U/L (ref 15–41)
Albumin: 4.1 g/dL (ref 3.5–5.0)
Alkaline Phosphatase: 79 U/L (ref 38–126)
Anion gap: 6 (ref 5–15)
BUN: 10 mg/dL (ref 6–20)
CO2: 30 mmol/L (ref 22–32)
Calcium: 9.8 mg/dL (ref 8.9–10.3)
Chloride: 104 mmol/L (ref 98–111)
Creatinine: 0.82 mg/dL (ref 0.44–1.00)
GFR, Estimated: >60 mL/min (ref >60)
Glucose, Bld: 100 mg/dL — ABNORMAL HIGH (ref 70–99)
Potassium: 4.6 mmol/L (ref 3.5–5.1)
Sodium: 140 mmol/L (ref 135–145)
Total Bilirubin: 0.4 mg/dL (ref 0.0–1.2)
Total Protein: 7.9 g/dL (ref 6.5–8.1)

## 2024-03-29 LAB — CBC WITH DIFFERENTIAL (CANCER CENTER ONLY)
Abs Immature Granulocytes: 0.02 K/uL (ref 0.00–0.07)
Basophils Absolute: 0 K/uL (ref 0.0–0.1)
Basophils Relative: 0 %
Eosinophils Absolute: 0.1 K/uL (ref 0.0–0.5)
Eosinophils Relative: 1 %
HCT: 44.4 % (ref 36.0–46.0)
Hemoglobin: 14.3 g/dL (ref 12.0–15.0)
Immature Granulocytes: 0 %
Lymphocytes Relative: 31 %
Lymphs Abs: 2.2 K/uL (ref 0.7–4.0)
MCH: 25.5 pg — ABNORMAL LOW (ref 26.0–34.0)
MCHC: 32.2 g/dL (ref 30.0–36.0)
MCV: 79.1 fL — ABNORMAL LOW (ref 80.0–100.0)
Monocytes Absolute: 0.4 K/uL (ref 0.1–1.0)
Monocytes Relative: 6 %
Neutro Abs: 4.6 K/uL (ref 1.7–7.7)
Neutrophils Relative %: 62 %
Platelet Count: 341 K/uL (ref 150–400)
RBC: 5.61 MIL/uL — ABNORMAL HIGH (ref 3.87–5.11)
RDW: 15.3 % (ref 11.5–15.5)
WBC Count: 7.4 K/uL (ref 4.0–10.5)
nRBC: 0 % (ref 0.0–0.2)

## 2024-03-29 NOTE — Progress Notes (Unsigned)
  Cancer Center Cancer Follow up:    Erin Emil Schanz, MD [address] [place] [address]   DIAGNOSIS: Cancer Staging  Malignant neoplasm of upper-outer quadrant of left breast in female, estrogen receptor positive (HCC) Staging form: Breast, AJCC 8th Edition - Clinical stage from 05/25/2019: Stage IA (cT1c, cN0, cM0, G1, ER+, PR+, HER2-) - Unsigned Stage prefix: Initial diagnosis Method of lymph node assessment: Clinical Histologic grading system: 3 grade system - Pathologic stage from 06/29/2019: Stage IA (pT1c, pN0(sn), cM0, G2, ER+, PR+, HER2-, Oncotype DX score: 13) - Unsigned Stage prefix: Initial diagnosis Method of lymph node assessment: Sentinel lymph node biopsy Multigene prognostic tests performed: Oncotype DX Recurrence score range: Greater than or equal to 11 Histologic grading system: 3 grade system    SUMMARY OF ONCOLOGIC HISTORY: Oncology History  Malignant neoplasm of upper-outer quadrant of left breast in female, estrogen receptor positive (HCC)  05/20/2019 Initial Diagnosis   Routine screening mammogram detected a 1.4cm left breast mass at the 2:30 position, no axillary adenopathy. 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    06/05/2019 Genetic Testing   Negative genetic testing. No pathogenic variants identified on the [facility] Common Hereditary Cancers Panel + STAT Breast Cancer panel.The STAT Breast cancer panel offered by [facility] includes sequencing and rearrangement analysis for the following 9 genes:  ATM, BRCA1, BRCA2, CDH1, CHEK2, PALB2, PTEN, STK11 and TP53. The Common Hereditary Cancers Panel offered by [facility] includes sequencing and/or deletion duplication testing of the following 47 genes: APC, ATM, AXIN2, BARD1, BMPR1A, BRCA1, BRCA2, BRIP1, CDH1, CDKN2A (p14ARF), CDKN2A (p16INK4a), CKD4, CHEK2, CTNNA1, DICER1, EPCAM (Deletion/duplication testing only), GREM1 (promoter region  deletion/duplication testing only), KIT, MEN1, MLH1, MSH2, MSH3, MSH6, MUTYH, NBN, NF1, NHTL1, PALB2, PDGFRA, PMS2, POLD1, POLE, PTEN, RAD50, RAD51C, RAD51D, SDHB, SDHC, SDHD, SMAD4, SMARCA4. STK11, TP53, TSC1, TSC2, and VHL.  The following genes were evaluated for sequence changes only: SDHA and HOXB13 c.251G>A variant only. The report date is 06/05/2019.    06/14/2019 Surgery   Left lumpectomy [facility]) [phone]): IDC, grade 2, with high grade DCIS, 1.7cm, clear margins, 2 left axillary lymph nodes negative for carcinoma.    06/24/2019 Oncotype testing   Recurrence score: 13; low risk, distant recurrence at 9 years: 4%   07/13/2019 - 08/31/2019 Radiation Therapy   The patient initially received a dose of 50.4 Gy in 28 fractions to the breast using whole-breast tangent fields. This was delivered using a 3-D conformal technique. The patient then received a boost to the seroma. This delivered an additional 10 Gy in 5 fractions using a 3-field photon boost technique. The total dose was 60.4 Gy.   09/2019 - 08/2023 Anti-estrogen oral therapy   Tamoxifen --stopped early due to pulmonary embolus   10/2023 -  Anti-estrogen oral therapy   Anastrozole  daily     CURRENT THERAPY:  INTERVAL HISTORY:  Discussed the use of AI scribe software for clinical note transcription with the patient, who gave verbal consent to proceed.  Erin Gallagher 53 y.o. female returns for    Patient Active Problem List   Diagnosis Date Noted  . History of pulmonary embolism 12/14/2023  . Hot flashes 12/14/2023  . Pulmonary embolism (HCC) 08/20/2023  . Pulmonary embolism without acute cor pulmonale (HCC) 08/19/2023  . Left leg claudication (HCC) 08/19/2023  . Prolonged QT interval 08/19/2023  . Degeneration of intervertebral disc of lumbar region with discogenic back pain and lower extremity pain 07/29/2023  .  Bilateral swelling of feet and ankles 03/11/2023  . Metabolic syndrome 02/08/2023  . Situational  hypertension 02/02/2023  . Family history of coronary artery disease 12/18/2022  . Pes planus 05/13/2022  . ANA positive 01/20/2022  . 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 Suspected sleep apnea 10/30/2021  . BMI 40.0-44.9, adult (HCC) 02/06/2021  . Morbid obesity (HCC) 02/06/2021  . History of breast cancer 02/06/2021  . Malignant neoplasm of upper-outer quadrant of left breast in female, 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  . Arthritis 11/23/2017  . Leg pain, diffuse, left 11/28/2014  . Essential hypertension 06/22/2013    is allergic to other and wound dressings.  MEDICAL HISTORY: Past Medical History:  Diagnosis Date  . ANA positive 01/20/2022  . Arthritis   . Blood transfusion without reported diagnosis   . Cancer [facility])    left breast cancer  . Family history of breast cancer   . Family history of colon cancer   . Family history of lung cancer   . Family history of throat cancer   . Gall bladder disease    gall bladder removal  . Hypertension   . 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 Personal history of radiation therapy   . Sleep apnea     SURGICAL HISTORY: Past Surgical History:  Procedure Laterality Date  . BREAST BIOPSY Left 05/16/2019  . BREAST LUMPECTOMY Left 06/14/2019  . BREAST LUMPECTOMY WITH RADIOACTIVE SEED AND SENTINEL LYMPH NODE BIOPSY Left 06/14/2019   Procedure: LEFT BREAST LUMPECTOMY WITH RADIOACTIVE SEED AND SENTINEL LYMPH NODE BIOPSY;  Surgeon: Aron Shoulders, MD;  Location: [facility];  Service: General;  Laterality: Left;  . CALCANEAL OSTEOTOMY Right 10/01/2023   Procedure: CALCANEAL OSTEOTOMY;  Surgeon: Kit Rush, MD;  Location: [facility];  Service: Orthopedics;  Laterality: Right;  . CESAREAN SECTION     1997  . CHOLECYSTECTOMY    . GASTROCNEMIUS RECESSION Right 10/01/2023   Procedure: GASTROCNEMIUS RECESSION;  Surgeon: Kit Rush, MD;  Location: [facility];   Service: Orthopedics;  Laterality: Right;  . POSTERIOR TIBIAL TENDON REPAIR Right 10/01/2023   Procedure: POSTERIOR TIBIAL TENDON TENOLYSIS;  Surgeon: Kit Rush, MD;  Location: [facility];  Service: Orthopedics;  Laterality: Right;  . TUBAL LIGATION     1998    SOCIAL HISTORY: Social History   Socioeconomic History  . Marital status: Married    Spouse name: Not on file  . Number of children: 1  . Years of education: Not on file  . Highest education level: 12th grade  Occupational History  . Occupation: Merchandiser, retail  Tobacco Use  . Smoking status: Never    Passive exposure: Current  . Smokeless tobacco: Never  Vaping Use  . Vaping status: Never Used  Substance and Sexual Activity  . Alcohol use: Yes    Alcohol/week: 2.0 standard drinks of alcohol    Types: 2 Standard drinks or equivalent per week    Comment: occasionally - 2 out of a 7 day period  . Drug use: No  . Sexual activity: Yes    Birth control/protection: Surgical    Comment: Tubal ligation  Other Topics Concern  . Not on file  Social History Narrative   Caffiene 1 cup coffee daily.    Lives home with spouse   Works at [employer]   Education 18yrs   Children one.    Social Drivers of Health   Financial Resource Strain: Low Risk  (08/16/2023)   Overall  Financial Resource Strain (CARDIA)   . Difficulty of Paying Living Expenses: Not very hard  Food Insecurity: No Food Insecurity (08/20/2023)   Hunger Vital Sign   . Worried About Programme researcher, broadcasting/film/video in the Last Year: Never true   . Ran Out of Food in the Last Year: Never true  Transportation Needs: No Transportation Needs (08/20/2023)   PRAPARE - Transportation   . Lack of Transportation (Medical): No   . Lack of Transportation (Non-Medical): No  Physical Activity: Insufficiently Active (08/16/2023)   Exercise Vital Sign   . Days of Exercise per Week: 3 days   . Minutes of Exercise per Session: 30 min  Stress: No Stress Concern Present  (08/16/2023)   [employer] of Occupational Health - Occupational Stress Questionnaire   . Feeling of Stress : Only a little  Recent Concern: Stress - Stress Concern Present (06/30/2023)   [employer] of Occupational Health - Occupational Stress Questionnaire   . Feeling of Stress : To some extent  Social Connections: Unknown (08/16/2023)   Social Connection and Isolation Panel   . Frequency of Communication with Friends and Family: More than three times a week   . Frequency of Social Gatherings with Friends and Family: Twice a week   . Attends Religious Services: Patient declined   . Active Member of Clubs or Organizations: No   . Attends Banker Meetings: Not on file   . Marital Status: Married  Catering manager Violence: Not At Risk (08/20/2023)   Humiliation, Afraid, Rape, and Kick questionnaire   . Fear of Current or Ex-Partner: No   . Emotionally Abused: No   . Physically Abused: No   . Sexually Abused: No    FAMILY HISTORY: Family History  Problem Relation Age of Onset  . Hypertension Mother   . Hypertension Father   . Colon cancer Father        dx 33s  . Throat cancer Maternal Uncle   . Lung cancer Paternal Uncle   . Stroke Maternal Grandmother   . Hypertension Maternal Grandmother   . Aneurysm Maternal Grandmother   . Healthy Son     Review of Systems - Oncology    PHYSICAL EXAMINATION   Onc Performance Status - 03/29/24 1115       ECOG Perf Status   ECOG Perf Status Restricted in physically strenuous activity but ambulatory and able to carry out work of a light or sedentary nature, e.g., light house work, office work      KPS SCALE   KPS % SCORE Able to carry on normal activity, minor s/s of disease          Vitals:   03/29/24 1115  BP: (!) 155/90  Pulse: 77  Resp: 18  Temp: 98.3 F (36.8 C)  SpO2: 98%    Physical Exam  LABORATORY DATA:  CBC    Component Value Date/Time   WBC 7.4 03/29/2024 1049   WBC 10.8 (H)  12/16/2023 0035   RBC 5.61 (H) 03/29/2024 1049   HGB 14.3 03/29/2024 1049   HGB 13.4 06/01/2020 1012   HCT 44.4 03/29/2024 1049   HCT 41.8 06/01/2020 1012   PLT 341 03/29/2024 1049   PLT 339 06/01/2020 1012   MCV 79.1 (L) 03/29/2024 1049   MCV 83 06/01/2020 1012   MCH 25.5 (L) 03/29/2024 1049   MCHC 32.2 03/29/2024 1049   RDW 15.3 03/29/2024 1049   RDW 14.0 06/01/2020 1012   LYMPHSABS 2.2 03/29/2024 1049  LYMPHSABS 2.0 11/23/2017 1648   MONOABS 0.4 03/29/2024 1049   EOSABS 0.1 03/29/2024 1049   EOSABS 0.1 11/23/2017 1648   BASOSABS 0.0 03/29/2024 1049   BASOSABS 0.0 11/23/2017 1648    CMP     Component Value Date/Time   NA 138 12/16/2023 0035   NA 142 02/06/2023 1140   K 3.4 (L) 12/16/2023 0035   CL 104 12/16/2023 0035   CO2 23 12/16/2023 0035   GLUCOSE 107 (H) 12/16/2023 0035   BUN 12 12/16/2023 0035   BUN 8 02/06/2023 1140   CREATININE 0.85 12/16/2023 0035   CREATININE 0.79 09/16/2023 1031   CREATININE 0.73 07/10/2014 0919   CALCIUM 9.5 12/16/2023 0035   PROT 7.5 12/14/2023 1101   PROT 6.8 03/01/2020 1107   ALBUMIN 4.2 12/14/2023 1101   ALBUMIN 4.1 03/01/2020 1107   AST 18 12/14/2023 1101   AST 18 09/16/2023 1031   ALT 23 12/14/2023 1101   ALT 21 09/16/2023 1031   ALKPHOS 72 12/14/2023 1101   BILITOT 0.4 12/14/2023 1101   BILITOT 0.5 09/16/2023 1031   GFRNONAA >60 12/16/2023 0035   GFRNONAA >60 09/16/2023 1031   GFRNONAA >89 07/10/2014 0919   GFRAA 112 06/01/2020 1012   GFRAA >60 05/25/2019 0810   GFRAA >89 07/10/2014 0919     ASSESSMENT and THERAPY PLAN:   No problem-specific Assessment & Plan notes found for this encounter.     All questions were answered. The patient knows to call the clinic with any problems, questions or concerns. We can certainly see the patient much sooner if necessary.  Total encounter time:*** minutes*in face-to-face visit time, chart review, lab review, care coordination, order entry, and documentation of the encounter  time.    Morna Kendall, NP 03/29/24 11:20 AM Medical Oncology and Hematology [facility] [address] [place], [place] [address] Tel. [phone]    Fax. [phone]  *Total Encounter Time as defined by the Centers for Medicare and Medicaid Services includes, in addition to the face-to-face time of a patient visit (documented in the note above) non-face-to-face time: obtaining and reviewing outside history, ordering and reviewing medications, tests or procedures, care coordination (communications with other health care professionals or caregivers) and documentation in the medical record.

## 2024-03-30 DIAGNOSIS — M67962 Unspecified disorder of synovium and tendon, left lower leg: Secondary | ICD-10-CM | POA: Diagnosis not present

## 2024-03-30 DIAGNOSIS — M67961 Unspecified disorder of synovium and tendon, right lower leg: Secondary | ICD-10-CM | POA: Diagnosis not present

## 2024-03-30 LAB — LUTEINIZING HORMONE: LH: 36.6 m[IU]/mL

## 2024-03-30 LAB — FOLLICLE STIMULATING HORMONE: FSH: 75.4 m[IU]/mL

## 2024-03-30 NOTE — Assessment & Plan Note (Signed)
 05/20/2019: Routine screening mammogram detected a 1.4cm left breast mass at the 2:30 position, no axillary adenopathy. 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  T1CN0 stage Ia clinical stage   06/14/2019: Left lumpectomy: Grade 2 IDC, 1.7 cm, high-grade DCIS, margins negative, no lymphovascular or perineural invasion, 0/2 lymph nodes negative, ER 100%, PR 100%, HER-2 negative, Ki-67 10% Oncotype DX score: 13, low risk, risk of distant recurrence at 9 years 4% Genetic testing: Negative Adjuvant radiation therapy: 07/14/2019-08/29/2019 Tamoxifen  20mg  x 4 years, stopped in 08/2023 due to pulmonary embolus Bone Density 02/11/22: T score +1.7 Anastrozole  10/2023  Left breast invasive ductal carcinoma Diagnosed October 2020. Post-lumpectomy and radiation. On anastrozole . December 2024 mammogram negative for malignancy.

## 2024-04-04 ENCOUNTER — Other Ambulatory Visit (HOSPITAL_COMMUNITY): Payer: Self-pay

## 2024-04-04 DIAGNOSIS — M67962 Unspecified disorder of synovium and tendon, left lower leg: Secondary | ICD-10-CM | POA: Diagnosis not present

## 2024-04-04 DIAGNOSIS — M67961 Unspecified disorder of synovium and tendon, right lower leg: Secondary | ICD-10-CM | POA: Diagnosis not present

## 2024-04-04 LAB — ESTRADIOL, ULTRA SENS: Estradiol, Sensitive: 6 pg/mL

## 2024-04-04 MED ORDER — PREDNISONE 10 MG PO TABS
ORAL_TABLET | ORAL | 0 refills | Status: DC
  Filled 2024-04-04: qty 14, 6d supply, fill #0

## 2024-04-06 ENCOUNTER — Ambulatory Visit: Payer: Self-pay

## 2024-04-06 NOTE — Telephone Encounter (Signed)
-----   Message from Morna JAYSON Kendall sent at 04/05/2024  9:22 AM EDT ----- Please let patient know that she is still post menopausal.  Please see if she is seeing gynecology for her bleeding, it should be promptly evaluated to make sure she doesn't have endometrial cancer.   ----- Message ----- From: Rebecka, Lab [facility] [place] Sent: 03/30/2024   8:37 AM EDT To: Morna Dalton Kendall, NP

## 2024-04-06 NOTE — Telephone Encounter (Signed)
*  late entry  LM 8/5 for pt to call the office to review lab results and update her bleeding issues

## 2024-04-06 NOTE — Telephone Encounter (Signed)
 This RN called pt per review of labs and onset of vaginal bleeding - per review by provider pt labs show pt is post menopausal and need for follow up at GYN.  This RN called per per above- she states the bleeding has resolved but she has not been seen by GYN recently.  Discussed concern noted by provider and need to be seen as soon as possible.  Pt verbalized understanding and will call to obtain an appointment.

## 2024-04-07 DIAGNOSIS — M67961 Unspecified disorder of synovium and tendon, right lower leg: Secondary | ICD-10-CM | POA: Diagnosis not present

## 2024-04-07 DIAGNOSIS — M67962 Unspecified disorder of synovium and tendon, left lower leg: Secondary | ICD-10-CM | POA: Diagnosis not present

## 2024-04-11 DIAGNOSIS — M67962 Unspecified disorder of synovium and tendon, left lower leg: Secondary | ICD-10-CM | POA: Diagnosis not present

## 2024-04-11 DIAGNOSIS — M67961 Unspecified disorder of synovium and tendon, right lower leg: Secondary | ICD-10-CM | POA: Diagnosis not present

## 2024-04-14 DIAGNOSIS — M67962 Unspecified disorder of synovium and tendon, left lower leg: Secondary | ICD-10-CM | POA: Diagnosis not present

## 2024-04-14 DIAGNOSIS — M67961 Unspecified disorder of synovium and tendon, right lower leg: Secondary | ICD-10-CM | POA: Diagnosis not present

## 2024-04-18 DIAGNOSIS — M67961 Unspecified disorder of synovium and tendon, right lower leg: Secondary | ICD-10-CM | POA: Diagnosis not present

## 2024-04-18 DIAGNOSIS — M67962 Unspecified disorder of synovium and tendon, left lower leg: Secondary | ICD-10-CM | POA: Diagnosis not present

## 2024-04-22 DIAGNOSIS — M67961 Unspecified disorder of synovium and tendon, right lower leg: Secondary | ICD-10-CM | POA: Diagnosis not present

## 2024-04-22 DIAGNOSIS — M67962 Unspecified disorder of synovium and tendon, left lower leg: Secondary | ICD-10-CM | POA: Diagnosis not present

## 2024-04-28 DIAGNOSIS — M67961 Unspecified disorder of synovium and tendon, right lower leg: Secondary | ICD-10-CM | POA: Diagnosis not present

## 2024-04-28 DIAGNOSIS — M67962 Unspecified disorder of synovium and tendon, left lower leg: Secondary | ICD-10-CM | POA: Diagnosis not present

## 2024-05-03 ENCOUNTER — Ambulatory Visit (INDEPENDENT_AMBULATORY_CARE_PROVIDER_SITE_OTHER): Payer: 59 | Admitting: Nurse Practitioner

## 2024-05-03 ENCOUNTER — Encounter: Payer: Self-pay | Admitting: Nurse Practitioner

## 2024-05-03 VITALS — BP 150/84 | Ht 65.5 in | Wt 270.0 lb

## 2024-05-03 DIAGNOSIS — Z01419 Encounter for gynecological examination (general) (routine) without abnormal findings: Secondary | ICD-10-CM | POA: Diagnosis not present

## 2024-05-03 DIAGNOSIS — I10 Essential (primary) hypertension: Secondary | ICD-10-CM

## 2024-05-03 DIAGNOSIS — N950 Postmenopausal bleeding: Secondary | ICD-10-CM | POA: Diagnosis not present

## 2024-05-03 DIAGNOSIS — Z1331 Encounter for screening for depression: Secondary | ICD-10-CM | POA: Diagnosis not present

## 2024-05-03 DIAGNOSIS — Z780 Asymptomatic menopausal state: Secondary | ICD-10-CM

## 2024-05-03 DIAGNOSIS — Z853 Personal history of malignant neoplasm of breast: Secondary | ICD-10-CM | POA: Diagnosis not present

## 2024-05-03 NOTE — Progress Notes (Signed)
 Erin Gallagher Erin Gallagher 07-29-1971 [id number]   History:  53 y.o. G2P0011 presents for annual exam. Reports intermittent vaginal spotting that started about a month ago. Has had a couple episodes of dark blood that lasted a few days, then light spotting occasionally. Postmenopausal - no HRT. Normal pap history. 2020 ER+ left breast cancer managed with lumpectomy, radiation, switched from Tamoxifen  to Anastrozole  in January after PE, on Eliquis . HTN managed  by PCP.   Gynecologic History Patient's last menstrual period was 12/30/2021.   Contraception/Family planning: post menopausal status and tubal ligation Sexually active: Yes  Health Maintenance Last Pap: 04/29/2023. Results were: Normal neg HPV Last mammogram: 11/30/2023 (left diagnostic). Results were: Normal Last colonoscopy: 06/12/2022. Results were: Benign polyp, 10-year recall Last Dexa: 01/29/2022. Results were: Normal     05/03/2024   10:15 AM  Depression screen PHQ 2/9  Decreased Interest 1  Down, Depressed, Hopeless 0  PHQ - 2 Score 1     Past medical history, past surgical history, family history and social history were all reviewed and documented in the EPIC chart. 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 68 yo son, truck driver, living at home. Father with history of colon cancer at age 27.   ROS:  A ROS was performed and pertinent positives and negatives are included.  Exam:  Vitals:   05/03/24 1004  BP: (!) 150/84  Weight: 270 lb (122.5 kg)  Height: 5' 5.5 (1.664 m)     Body mass index is 44.25 kg/m.  General appearance:  Normal Thyroid :  Symmetrical, normal in size, without palpable masses or nodularity. Respiratory  Auscultation:  Clear without wheezing or rhonchi Cardiovascular  Auscultation:  Regular rate, without rubs, murmurs or gallops  Edema/varicosities:  Not grossly evident Abdominal  Soft,nontender, without masses, guarding or rebound.  Liver/spleen:  No  organomegaly noted  Hernia:  None appreciated  Skin  Inspection:  Grossly normal Breasts: Examined lying and sitting.   Right: Without masses, retractions, nipple discharge or axillary adenopathy.   Left: Without masses, retractions, nipple discharge or axillary adenopathy. Pelvic: External genitalia:  no lesions              Urethra:  normal appearing urethra with no masses, tenderness or lesions              Bartholins and Skenes: normal                 Vagina: normal appearing vagina with normal color and discharge, no lesions. Old blood present in vault.               Cervix: no lesions Bimanual Exam:  Uterus:  no masses or tenderness              Adnexa: no mass, fullness, tenderness              Rectovaginal: Deferred              Anus:  normal, no lesions  Dereck Keas, CMA present as chaperone.   Assessment/Plan:  53 y.o. G2P0011 for annual exam.   Well female exam with routine gynecological exam - Education provided on SBEs, importance of preventative screenings, current guidelines, high calcium diet, regular exercise, and multivitamin daily.  Labs with PCP.   Postmenopausal bleeding - Plan: US  PELVIS TRANSVAGINAL NON-OB (TV ONLY). Schedule ultrasound. Switched to Anastrozole  in January.   Elevated blood pressure reading with diagnosis of hypertension - BP 150/84. Has not  taken BP meds today.   History of left breast cancer - 2020 ER+ managed with lumpectomy, radiation, switched to Anastrozole  in January after PE. Followed by oncology. UTD on mammogram. Normal breast exam today.   Screening for cervical cancer - Normal pap history. Will repeat at 5-year interval per guidelines.   Screening for colon cancer - 06/2022 colonoscopy. Will repeat at GI's recommended interval. Father with history of colon cancer.   Screening for osteoporosis - Normal bone density May 2023.  Return in about 1 year (around 05/03/2025) for Annual.    Erin DELENA Shutter DNP, 10:41 AM 05/03/2024

## 2024-05-05 DIAGNOSIS — M67961 Unspecified disorder of synovium and tendon, right lower leg: Secondary | ICD-10-CM | POA: Diagnosis not present

## 2024-05-05 DIAGNOSIS — M67962 Unspecified disorder of synovium and tendon, left lower leg: Secondary | ICD-10-CM | POA: Diagnosis not present

## 2024-05-25 ENCOUNTER — Other Ambulatory Visit: Payer: Self-pay

## 2024-05-25 DIAGNOSIS — M25571 Pain in right ankle and joints of right foot: Secondary | ICD-10-CM | POA: Diagnosis not present

## 2024-05-26 ENCOUNTER — Other Ambulatory Visit: Admitting: Nurse Practitioner

## 2024-05-26 ENCOUNTER — Other Ambulatory Visit

## 2024-05-30 NOTE — Progress Notes (Unsigned)
   Acute Office Visit  Subjective:    Patient ID: Erin Gallagher, female    DOB: 1971-05-20, 53 y.o.   MRN: [id number]   HPI 53 y.o. presents today for ultrasound. Seen 05/03/24 with complaints of intermittent vaginal spotting that started about a month prior. Has had a couple episodes of dark blood that lasted a few days, then light spotting occasionally. Postmenopausal - no HRT. Normal pap history. 2020 ER+ left breast cancer managed with lumpectomy, radiation, switched from Tamoxifen  to Anastrozole  in January after PE, on Eliquis .  Patient's last menstrual period was 12/30/2021.    Review of Systems     Objective:    Physical Exam  LMP 12/30/2021  Wt Readings from Last 3 Encounters:  05/03/24 270 lb (122.5 kg)  03/29/24 265 lb (120.2 kg)  12/14/23 248 lb (112.5 kg)        Zada Louder, CMA present as chaperone.   Assessment & Plan:   Problem List Items Addressed This Visit   None   No follow-ups on file.    [name] DNP, 3:31 PM 05/30/2024

## 2024-05-31 ENCOUNTER — Ambulatory Visit: Admitting: Nurse Practitioner

## 2024-05-31 ENCOUNTER — Encounter: Payer: Self-pay | Admitting: Nurse Practitioner

## 2024-05-31 ENCOUNTER — Ambulatory Visit (INDEPENDENT_AMBULATORY_CARE_PROVIDER_SITE_OTHER)

## 2024-05-31 VITALS — BP 140/88 | HR 81 | Ht 65.5 in | Wt 268.0 lb

## 2024-05-31 DIAGNOSIS — N950 Postmenopausal bleeding: Secondary | ICD-10-CM | POA: Diagnosis not present

## 2024-05-31 DIAGNOSIS — D251 Intramural leiomyoma of uterus: Secondary | ICD-10-CM

## 2024-05-31 DIAGNOSIS — D252 Subserosal leiomyoma of uterus: Secondary | ICD-10-CM | POA: Diagnosis not present

## 2024-06-16 ENCOUNTER — Other Ambulatory Visit (HOSPITAL_COMMUNITY): Payer: Self-pay

## 2024-06-16 MED ORDER — FLUZONE 0.5 ML IM SUSY
0.5000 mL | PREFILLED_SYRINGE | Freq: Once | INTRAMUSCULAR | 0 refills | Status: AC
  Filled 2024-06-16: qty 0.5, 1d supply, fill #0

## 2024-06-21 ENCOUNTER — Other Ambulatory Visit: Payer: Self-pay | Admitting: Emergency Medicine

## 2024-06-21 DIAGNOSIS — Z1231 Encounter for screening mammogram for malignant neoplasm of breast: Secondary | ICD-10-CM

## 2024-06-22 ENCOUNTER — Ambulatory Visit (INDEPENDENT_AMBULATORY_CARE_PROVIDER_SITE_OTHER): Admitting: Obstetrics and Gynecology

## 2024-06-22 ENCOUNTER — Encounter: Payer: Self-pay | Admitting: Obstetrics and Gynecology

## 2024-06-22 ENCOUNTER — Other Ambulatory Visit (HOSPITAL_COMMUNITY)
Admission: RE | Admit: 2024-06-22 | Discharge: 2024-06-22 | Disposition: A | Discharge status: Home / Self Care | Source: Ambulatory Visit | Attending: Obstetrics and Gynecology | Admitting: Obstetrics and Gynecology

## 2024-06-22 VITALS — BP 122/70 | HR 102

## 2024-06-22 DIAGNOSIS — N950 Postmenopausal bleeding: Secondary | ICD-10-CM | POA: Diagnosis not present

## 2024-06-22 DIAGNOSIS — N858 Other specified noninflammatory disorders of uterus: Secondary | ICD-10-CM | POA: Diagnosis not present

## 2024-06-22 NOTE — Progress Notes (Signed)
   Acute Office Visit  Subjective:    Patient ID: Erin Gallagher, female    DOB: Sep 24, 1970, 53 y.o.   MRN: [id number] Per Ms. Prentiss last note: . presents today for ultrasound. Seen 05/03/24 with complaints of intermittent vaginal spotting that started about a month prior. Has had a couple episodes of dark blood that lasted a few days, then light spotting occasionally. No bleeding since last visit. Postmenopausal - no HRT. Normal pap history. 2020 ER+ left breast cancer managed with lumpectomy, radiation, switched from Tamoxifen  to Anastrozole  in January after PE, on Eliquis .  HPI 53 y.o. presents today for Procedure (Endometrial biopsy ) .  Patient's last menstrual period was 12/30/2021.     Study Result  Narrative & Impression  Vaginal ultrasound (comparison is made with CT scan April 2025):   Anteverted lobular uterus, multiple intramural, subserosal, and pedunculated fibroids. No change in size of largest (4.33 cm) since prior CT scan.  Endometrium is distorted by central fibroids.  No obvious masses or thickening or abnormal blood flow seen but limited evaluation due to fibroids.     Both ovaries small with atrophic appearance.     No adnexal masses (except pedunculated fibroid to the left), no free fluid.     Review of Systems        Component Value Date/Time   DIAGPAP  04/29/2023 1112    - Negative for intraepithelial lesion or malignancy (NILM)   HPVHIGH Negative 04/29/2023 1112   ADEQPAP  04/29/2023 1112    Satisfactory for evaluation; transformation zone component ABSENT.    Objective:    OBGyn Exam  BP 122/70 (BP Location: Left Arm, Patient Position: Sitting)   Pulse (!) 102   LMP 12/30/2021   SpO2 96%  Wt Readings from Last 3 Encounters:  05/31/24 268 lb (121.6 kg)  05/03/24 270 lb (122.5 kg)  03/29/24 265 lb (120.2 kg)       TIME OUT PERFORMED PROCEDURE: EMB Consent obtained for the procedure.  A bivalve speculum was placed in the vagina.  The  cervix was grasped with a single tooth tenaculum.  Pipelle was inserted and rotated. Uterus sound to 8cm. Adequate specimen was obtained and sent to pathology.  All instruments were removed.  Patient tolerated the procedure well.  To notify patient of the results.  Assessment & Plan:  PMB EMB collected.  To notify patient of the results. Did have some nausea after that resolved with water and some rest. RTC with persistent bleeding or any concerns  Erin Gallagher

## 2024-06-24 ENCOUNTER — Other Ambulatory Visit: Payer: Self-pay | Admitting: Obstetrics and Gynecology

## 2024-06-24 ENCOUNTER — Other Ambulatory Visit (HOSPITAL_COMMUNITY): Payer: Self-pay

## 2024-06-24 ENCOUNTER — Ambulatory Visit: Payer: Self-pay | Admitting: Obstetrics and Gynecology

## 2024-06-24 LAB — SURGICAL PATHOLOGY

## 2024-06-24 MED ORDER — DOXYCYCLINE HYCLATE 100 MG PO CAPS
100.0000 mg | ORAL_CAPSULE | Freq: Two times a day (BID) | ORAL | 0 refills | Status: AC
  Filled 2024-06-24: qty 20, 10d supply, fill #0

## 2024-07-01 ENCOUNTER — Encounter: Payer: Self-pay | Admitting: Nurse Practitioner

## 2024-07-01 DIAGNOSIS — R1032 Left lower quadrant pain: Secondary | ICD-10-CM

## 2024-07-01 NOTE — Telephone Encounter (Signed)
 Call placed to patient. No answer. Left detailed message, ok per dpr. Advised f/u from previous call. ER/Urgent care precautions provided for new or worsening symptoms, severe pain, fever/chills, N/V or heavy bleeding. Return call to office at [phone], opt 4 to further discuss.

## 2024-07-01 NOTE — Telephone Encounter (Signed)
 I have LM asking pt to call back, so we can get more information about the pain she is having.

## 2024-07-04 ENCOUNTER — Encounter: Payer: Self-pay | Admitting: Radiology

## 2024-07-04 DIAGNOSIS — M25871 Other specified joint disorders, right ankle and foot: Secondary | ICD-10-CM | POA: Diagnosis not present

## 2024-07-04 DIAGNOSIS — S93691D Other sprain of right foot, subsequent encounter: Secondary | ICD-10-CM | POA: Diagnosis not present

## 2024-07-22 ENCOUNTER — Other Ambulatory Visit (HOSPITAL_COMMUNITY): Payer: Self-pay

## 2024-07-22 ENCOUNTER — Other Ambulatory Visit: Payer: Self-pay

## 2024-07-22 NOTE — Telephone Encounter (Signed)
 Attempted to reach patient by phone to further assess. Left message to call [facility] Triage at [phone], option 4.   Dr. Glennon -please review MyChart message updates and advise.

## 2024-07-22 NOTE — Telephone Encounter (Signed)
 Dr. Glennon -please advise on PMB?  Please advise if ok to schedule with covering provider next week or when you return if OV needed.   I will assist with scheduling CT scan  EMB on 06/22/24, benign  PUS 05/31/24

## 2024-07-24 ENCOUNTER — Other Ambulatory Visit: Payer: Self-pay | Admitting: Obstetrics and Gynecology

## 2024-07-25 ENCOUNTER — Other Ambulatory Visit (HOSPITAL_COMMUNITY): Payer: Self-pay

## 2024-07-26 ENCOUNTER — Other Ambulatory Visit (HOSPITAL_COMMUNITY): Payer: Self-pay

## 2024-07-26 NOTE — Telephone Encounter (Signed)
 Patient called and stated that she has started having heavy bleeding now with clots, not much pain. Some fatigue, patient is taking eliquis . No nausea. Patient is having to change a pad q 2-3 hrs. Patient has taken tylenol .  Patient is aware Dr Glennon is out of the office and this will be sent to the covering provider for review.

## 2024-07-27 ENCOUNTER — Ambulatory Visit: Admitting: Obstetrics and Gynecology

## 2024-07-27 ENCOUNTER — Encounter: Payer: Self-pay | Admitting: Nurse Practitioner

## 2024-07-27 ENCOUNTER — Other Ambulatory Visit (HOSPITAL_COMMUNITY): Payer: Self-pay

## 2024-07-27 ENCOUNTER — Other Ambulatory Visit (HOSPITAL_COMMUNITY)
Admission: RE | Admit: 2024-07-27 | Discharge: 2024-07-27 | Disposition: A | Source: Ambulatory Visit | Attending: Obstetrics and Gynecology | Admitting: Obstetrics and Gynecology

## 2024-07-27 ENCOUNTER — Other Ambulatory Visit: Payer: Self-pay

## 2024-07-27 ENCOUNTER — Encounter: Payer: Self-pay | Admitting: Obstetrics and Gynecology

## 2024-07-27 VITALS — BP 126/84 | HR 91

## 2024-07-27 DIAGNOSIS — N950 Postmenopausal bleeding: Secondary | ICD-10-CM | POA: Insufficient documentation

## 2024-07-27 DIAGNOSIS — D219 Benign neoplasm of connective and other soft tissue, unspecified: Secondary | ICD-10-CM | POA: Diagnosis not present

## 2024-07-27 DIAGNOSIS — R1032 Left lower quadrant pain: Secondary | ICD-10-CM

## 2024-07-27 MED ORDER — MEDROXYPROGESTERONE ACETATE 10 MG PO TABS
10.0000 mg | ORAL_TABLET | Freq: Every day | ORAL | 0 refills | Status: DC
  Filled 2024-07-27 (×2): qty 14, 14d supply, fill #0

## 2024-07-27 NOTE — Progress Notes (Signed)
 GYNECOLOGY  VISIT   HPI: 53 y.o.   Married  African American female   [phone] with Patient's last menstrual period was 12/30/2021.   here for: PMP Bleeding -  having heavy bleeding with clots, not much pain. Some fatigue, patient is taking Eliquis . Having to change a pad  2-3 hrs.   Has had periodic bleeding off and on for 6 months.   Her LMP was 12/30/21.    US  05/31/24 showed fibroids.  Left pedunculated fibroid, about 3 cm. Endometrium was obscured.  Ovaries atrophic.   Endometrial biopsy done 06/22/24 for PMB with Dr. Glennon - benign pathology.  Patient was treated with a course of Doxycyline starting 06/24/24 for suspected endometritis due to pain following the endometrial biopsy.    Heavy bleeding started last week.   Some chills.  No fever.     She states dull pain in LLQ pain for longer than one month.   Not having regular BMs.  BM does not relieve pain.  Last BM this am.   No dysuria.   No renal stones.    CT abdomen/pelvis 12/16/23 showed fibroid uterus.   FSH 75..4 on 03/29/24.    Hx ER/PR positive left breast cancer.  On Anastrozole  after having a PE on Tamoxifen .  GYNECOLOGIC HISTORY: Patient's last menstrual period was 12/30/2021. Contraception:  PMP Menopausal hormone therapy:  n/a Last 2 paps:  04/29/23 neg, HR HPV neg, 12/03/17 neg HR HPV neg  History of abnormal Pap or positive HPV:  no Mammogram:  11/30/23 Breast Density Cat B, BIRADS Cat 2 benign         OB History     Gravida  2   Para      Term      Preterm      AB  1   Living  1      SAB      IAB  1   Ectopic      Multiple      Live Births                 Patient Active Problem List   Diagnosis Date Noted   History of pulmonary embolism 12/14/2023   Hot flashes 12/14/2023   Pulmonary embolism (HCC) 08/20/2023   Pulmonary embolism without acute cor pulmonale (HCC) 08/19/2023   Left leg claudication 08/19/2023   Prolonged QT interval 08/19/2023   Degeneration of  intervertebral disc of lumbar region with discogenic back pain and lower extremity pain 07/29/2023   Bilateral swelling of feet and ankles 03/11/2023   Metabolic syndrome 02/08/2023   Situational hypertension 02/02/2023   Family history of coronary artery disease 12/18/2022   Pes planus 05/13/2022   ANA positive 01/20/2022   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   Suspected sleep apnea 10/30/2021   BMI 40.0-44.9, adult (HCC) 02/06/2021   Morbid obesity (HCC) 02/06/2021   History of breast cancer 02/06/2021   Malignant neoplasm of upper-outer quadrant of left breast in female, 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   Arthritis 11/23/2017   Leg pain, diffuse, left 11/28/2014   Essential hypertension 06/22/2013    Past Medical History:  Diagnosis Date   ANA positive 01/20/2022   Arthritis    Blood transfusion without reported diagnosis    Cancer (HCC)    left breast cancer   Family history of breast cancer    Family history of colon cancer    Family history of lung cancer    Family history of throat cancer  Gall bladder disease    gall bladder removal   Hypertension    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   Personal history of radiation therapy    Sleep apnea     Past Surgical History:  Procedure Laterality Date   BREAST BIOPSY Left 05/16/2019   BREAST LUMPECTOMY Left 06/14/2019   BREAST LUMPECTOMY WITH RADIOACTIVE SEED AND SENTINEL LYMPH NODE BIOPSY Left 06/14/2019   Procedure: LEFT BREAST LUMPECTOMY WITH RADIOACTIVE SEED AND SENTINEL LYMPH NODE BIOPSY;  Surgeon: Aron Shoulders, MD;  Location: [facility];  Service: General;  Laterality: Left;   CALCANEAL OSTEOTOMY Right 10/01/2023   Procedure: CALCANEAL OSTEOTOMY;  Surgeon: Kit Rush, MD;  Location: [facility];  Service: Orthopedics;  Laterality: Right;   CESAREAN SECTION     1997   CHOLECYSTECTOMY     FOOT SURGERY Right    GASTROCNEMIUS RECESSION Right 10/01/2023    Procedure: GASTROCNEMIUS RECESSION;  Surgeon: Kit Rush, MD;  Location: [facility];  Service: Orthopedics;  Laterality: Right;   POSTERIOR TIBIAL TENDON REPAIR Right 10/01/2023   Procedure: POSTERIOR TIBIAL TENDON TENOLYSIS;  Surgeon: Kit Rush, MD;  Location: [facility];  Service: Orthopedics;  Laterality: Right;   TUBAL LIGATION     1998    Current Outpatient Medications  Medication Sig Dispense Refill   amLODipine  (NORVASC ) 5 MG tablet Take 1 tablet (5 mg total) by mouth daily. 90 tablet 3   anastrozole  (ARIMIDEX ) 1 MG tablet Take 1 tablet (1 mg total) by mouth daily. 90 tablet 3   apixaban  (ELIQUIS ) 5 MG TABS tablet Take 1 tablet (5 mg total) by mouth 2 (two) times daily. 60 tablet 5   cyclobenzaprine  (FLEXERIL ) 10 MG tablet Take 1 tablet (10 mg total) by mouth at bedtime. 30 tablet 0   gabapentin  (NEURONTIN ) 100 MG capsule Take 1 capsule (100 mg total) by mouth at bedtime. 90 capsule 0   HYDROcodone -acetaminophen  (NORCO/VICODIN) 5-325 MG tablet Take 1 tablet by mouth every 6 (six) hours as needed for 5 days 20 tablet 0   losartan -hydrochlorothiazide  (HYZAAR ) 100-12.5 MG tablet Take 1 tablet by mouth daily. 90 tablet 3   oxyCODONE  (ROXICODONE ) 5 MG immediate release tablet Take 1 tablet (5 mg total) by mouth every 6 (six) hours as needed for severe pain (pain score 7-10). 12 tablet 0   TYLENOL  500 MG tablet Take 500-1,000 mg by mouth every 6 (six) hours as needed for mild pain (pain score 1-3) or headache.     VITAMIN D  PO Take 1,000 Units by mouth daily.     No current facility-administered medications for this visit.     ALLERGIES: Other and Wound dressings  Family History  Problem Relation Age of Onset   Hypertension Mother    Hypertension Father    Colon cancer Father        dx 26s   Throat cancer Maternal Uncle    Lung cancer Paternal Uncle    Stroke Maternal Grandmother    Hypertension Maternal Grandmother    Aneurysm Maternal  Grandmother    Healthy Son     Social History   Socioeconomic History   Marital status: Married    Spouse name: Not on file   Number of children: 1   Years of education: Not on file   Highest education level: 12th grade  Occupational History   Occupation: Merchandiser, Retail  Tobacco Use   Smoking status: Never    Passive exposure: Current   Smokeless tobacco: Never  Vaping Use   Vaping status: Never Used  Substance and Sexual Activity   Alcohol use: Yes    Alcohol/week: 2.0 standard drinks of alcohol    Types: 2 Standard drinks or equivalent per week    Comment: occasionally - 2 out of a 7 day period   Drug use: No   Sexual activity: Yes    Birth control/protection: Surgical    Comment: Tubal ligation  Other Topics Concern   Not on file  Social History Narrative   Caffiene 1 cup coffee daily.    Lives home with spouse   Works at [employer]   Education 57yrs   Children one.    Social Drivers of Corporate Investment Banker Strain: Low Risk  (08/16/2023)   Overall Financial Resource Strain (CARDIA)    Difficulty of Paying Living Expenses: Not very hard  Food Insecurity: No Food Insecurity (08/20/2023)   Hunger Vital Sign    Worried About Running Out of Food in the Last Year: Never true    Ran Out of Food in the Last Year: Never true  Transportation Needs: No Transportation Needs (08/20/2023)   PRAPARE - Administrator, Civil Service (Medical): No    Lack of Transportation (Non-Medical): No  Physical Activity: Insufficiently Active (08/16/2023)   Exercise Vital Sign    Days of Exercise per Week: 3 days    Minutes of Exercise per Session: 30 min  Stress: No Stress Concern Present (08/16/2023)   [employer] of Occupational Health - Occupational Stress Questionnaire    Feeling of Stress : Only a little  Recent Concern: Stress - Stress Concern Present (06/30/2023)   [employer] of Occupational Health - Occupational Stress Questionnaire    Feeling of  Stress : To some extent  Social Connections: Unknown (08/16/2023)   Social Connection and Isolation Panel    Frequency of Communication with Friends and Family: More than three times a week    Frequency of Social Gatherings with Friends and Family: Twice a week    Attends Religious Services: Patient declined    Database Administrator or Organizations: No    Attends Engineer, Structural: Not on file    Marital Status: Married  Catering Manager Violence: Not At Risk (08/20/2023)   Humiliation, Afraid, Rape, and Kick questionnaire    Fear of Current or Ex-Partner: No    Emotionally Abused: No    Physically Abused: No    Sexually Abused: No    Review of Systems  All other systems reviewed and are negative.   PHYSICAL EXAMINATION:   BP 126/84 (BP Location: Left Arm, Patient Position: Sitting)   Pulse 91   LMP 12/30/2021   SpO2 97%     General appearance: alert, cooperative and appears stated age   Pelvic: External genitalia:  no lesions              Urethra:  normal appearing urethra with no masses, tenderness or lesions              Bartholins and Skenes: normal                 Vagina: normal appearing vagina with normal color and discharge, no lesions              Cervix: no lesions.  Vaginal bleeding noted.                  Bimanual Exam:  Uterus:  normal size, contour,  position, consistency, mobility, non-tender              Adnexa: no mass, fullness, tenderness            Chaperone was present for exam:  Kari HERO, CMA  ASSESSMENT:  Postmenopausal bleeding.  Fibroids.  Hx ER/PR breast cancer.  On Anastrozole .  Hx PE on Tamoxifen .  On Eliquis .  LLQ pain.   PLAN:  Chart review done.  GC/CT/trich testing.  Provera  10 mg daily for 14 days.  Side effects reviewed.  Patient aware this is progesterone and is a short course to try to control her bleeding.   Will order abdominal and pelvic CT with/without contrast.  Return to see Dr. Glennon in 10 days.    33 min   total time was spent for this patient encounter, including preparation, face-to-face counseling with the patient, coordination of care, and documentation of the encounter.

## 2024-07-27 NOTE — Telephone Encounter (Signed)
 Spoke with Erin Gallagher at [facility]. Patient scheduled for CT abdomen pelvis w/wo contrast on 08/03/24 at [facility], arrive at 4 PM to start drinking oral contrast. No additional restrictions.    Call placed to patient, left detailed message, ok per dpr. Advised of appt details as seen above. Advised patient to let us  know if GI referral is needed or if she has a GI that she is established with already. Return call to office if any additional questions prior to appt. [phone], opt 4.   Routing to provider for final review. Patient is agreeable to disposition. Will close encounter.

## 2024-07-27 NOTE — Telephone Encounter (Signed)
 Spoke with patient. Patient scheduled for work in appt with Dr. Nikki this morning at 1015. Patient reports no change in bleeding; however, she just woke up.   Patient agreeable to CT scan of abdomen, would like [facility] if possible.  Advised I need to confirm order for imaging and will f/u once scheduled. Patient agreeable.   Dr. Nikki -can you confirm if CT abdomen is w/wo contrast?

## 2024-07-29 LAB — CERVICOVAGINAL ANCILLARY ONLY
Chlamydia: NEGATIVE
Comment: NEGATIVE
Comment: NEGATIVE
Comment: NORMAL
Neisseria Gonorrhea: NEGATIVE
Trichomonas: NEGATIVE

## 2024-08-02 ENCOUNTER — Encounter (HOSPITAL_BASED_OUTPATIENT_CLINIC_OR_DEPARTMENT_OTHER): Payer: Self-pay

## 2024-08-02 ENCOUNTER — Other Ambulatory Visit (HOSPITAL_COMMUNITY): Payer: Self-pay

## 2024-08-03 ENCOUNTER — Ambulatory Visit (HOSPITAL_BASED_OUTPATIENT_CLINIC_OR_DEPARTMENT_OTHER): Admission: RE | Admit: 2024-08-03 | Discharge: 2024-08-03 | Attending: Obstetrics and Gynecology

## 2024-08-03 DIAGNOSIS — R1032 Left lower quadrant pain: Secondary | ICD-10-CM | POA: Insufficient documentation

## 2024-08-03 MED ORDER — IOHEXOL 300 MG/ML  SOLN
100.0000 mL | Freq: Once | INTRAMUSCULAR | Status: AC | PRN
  Administered 2024-08-03: 100 mL via INTRAVENOUS

## 2024-08-03 NOTE — Assessment & Plan Note (Signed)
 05/20/2019: Routine screening mammogram detected a 1.4cm left breast mass at the 2:30 position, no axillary adenopathy. 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  T1CN0 stage Ia clinical stage   06/14/2019: Left lumpectomy: Grade 2 IDC, 1.7 cm, high-grade DCIS, margins negative, no lymphovascular or perineural invasion, 0/2 lymph nodes negative, ER 100%, PR 100%, HER-2 negative, Ki-67 10% Oncotype DX score: 13, low risk, risk of distant recurrence at 9 years 4% Genetic testing: Negative Adjuvant radiation therapy: 07/14/2019-08/29/2019   Current treatment: Adjuvant antiestrogen therapy with tamoxifen  20 mg daily, could switch to aromatase inhibitor once she is menopausal   Tamoxifen  toxicities: Right lower extremity muscle aches and pains: I instructed her to take tamoxifen  at bedtime and also to take vitamin D  and tonic water. She continues to have menstrual cycles and therefore she is not yet in menopause.  She is getting a work-up for her joint aches and pains.   Breast cancer surveillance: 1.  Mammogram scheduled for 08/09/2024 2. breast exam 08/08/2024: Benign   Bone Density 02/11/22: T score +1.7

## 2024-08-04 ENCOUNTER — Ambulatory Visit: Payer: Self-pay | Admitting: Obstetrics and Gynecology

## 2024-08-05 ENCOUNTER — Ambulatory Visit: Payer: Self-pay | Admitting: Obstetrics and Gynecology

## 2024-08-05 DIAGNOSIS — N200 Calculus of kidney: Secondary | ICD-10-CM

## 2024-08-08 ENCOUNTER — Other Ambulatory Visit (HOSPITAL_COMMUNITY): Payer: Self-pay

## 2024-08-08 ENCOUNTER — Other Ambulatory Visit: Payer: Self-pay

## 2024-08-08 ENCOUNTER — Ambulatory Visit: Payer: 59 | Attending: Hematology and Oncology | Admitting: Hematology and Oncology

## 2024-08-08 VITALS — BP 164/81 | HR 106 | Temp 98.0°F | Resp 18 | Ht 65.5 in | Wt 273.3 lb

## 2024-08-08 DIAGNOSIS — C50412 Malignant neoplasm of upper-outer quadrant of left female breast: Secondary | ICD-10-CM | POA: Insufficient documentation

## 2024-08-08 DIAGNOSIS — Z923 Personal history of irradiation: Secondary | ICD-10-CM | POA: Insufficient documentation

## 2024-08-08 DIAGNOSIS — Z79811 Long term (current) use of aromatase inhibitors: Secondary | ICD-10-CM | POA: Insufficient documentation

## 2024-08-08 DIAGNOSIS — Z170 Estrogen receptor positive status [ER+]: Secondary | ICD-10-CM | POA: Insufficient documentation

## 2024-08-08 DIAGNOSIS — Z1721 Progesterone receptor positive status: Secondary | ICD-10-CM | POA: Insufficient documentation

## 2024-08-08 DIAGNOSIS — Z86711 Personal history of pulmonary embolism: Secondary | ICD-10-CM | POA: Insufficient documentation

## 2024-08-08 MED ORDER — ANASTROZOLE 1 MG PO TABS
1.0000 mg | ORAL_TABLET | Freq: Every day | ORAL | 3 refills | Status: AC
  Filled 2024-08-08: qty 90, 90d supply, fill #0

## 2024-08-08 NOTE — Progress Notes (Signed)
 Patient Care Team: [name], MD as PCP - General (Internal Medicine) Anner Alm ORN, MD as PCP - Cardiology (Cardiology) Aron Shoulders, MD as Consulting Physician (General Surgery) Odean Potts, MD as Consulting Physician (Hematology and Oncology) Dewey Rush, MD as Consulting Physician (Radiation Oncology) Prentiss Annabella LABOR, NP as Nurse Practitioner (Gynecology)  DIAGNOSIS:  Encounter Diagnosis  Name Primary?   Malignant neoplasm of upper-outer quadrant of left breast in female, estrogen receptor positive (HCC) Yes    SUMMARY OF ONCOLOGIC HISTORY: Oncology History  Malignant neoplasm of upper-outer quadrant of left breast in female, estrogen receptor positive (HCC)  05/20/2019 Initial Diagnosis   Routine screening mammogram detected a 1.4cm left breast mass at the 2:30 position, no axillary adenopathy. 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    06/05/2019 Genetic Testing   Negative genetic testing. No pathogenic variants identified on the [facility] Common Hereditary Cancers Panel + STAT Breast Cancer panel.The STAT Breast cancer panel offered by [facility] includes sequencing and rearrangement analysis for the following 9 genes:  ATM, BRCA1, BRCA2, CDH1, CHEK2, PALB2, PTEN, STK11 and TP53. The Common Hereditary Cancers Panel offered by [facility] includes sequencing and/or deletion duplication testing of the following 47 genes: APC, ATM, AXIN2, BARD1, BMPR1A, BRCA1, BRCA2, BRIP1, CDH1, CDKN2A (p14ARF), CDKN2A (p16INK4a), CKD4, CHEK2, CTNNA1, DICER1, EPCAM (Deletion/duplication testing only), GREM1 (promoter region deletion/duplication testing only), KIT, MEN1, MLH1, MSH2, MSH3, MSH6, MUTYH, NBN, NF1, NHTL1, PALB2, PDGFRA, PMS2, POLD1, POLE, PTEN, RAD50, RAD51C, RAD51D, SDHB, SDHC, SDHD, SMAD4, SMARCA4. STK11, TP53, TSC1, TSC2, and VHL.  The following genes were evaluated for sequence changes only: SDHA and HOXB13 c.251G>A variant only. The  report date is 06/05/2019.    06/14/2019 Surgery   Left lumpectomy [facility]) [phone]): IDC, grade 2, with high grade DCIS, 1.7cm, clear margins, 2 left axillary lymph nodes negative for carcinoma.    06/24/2019 Oncotype testing   Recurrence score: 13; low risk, distant recurrence at 9 years: 4%   07/13/2019 - 08/31/2019 Radiation Therapy   The patient initially received a dose of 50.4 Gy in 28 fractions to the breast using whole-breast tangent fields. This was delivered using a 3-D conformal technique. The patient then received a boost to the seroma. This delivered an additional 10 Gy in 5 fractions using a 3-field photon boost technique. The total dose was 60.4 Gy.   09/2019 - 08/2023 Anti-estrogen oral therapy   Tamoxifen --stopped early due to pulmonary embolus   10/2023 -  Anti-estrogen oral therapy   Anastrozole  daily     CHIEF COMPLIANT: Follow-up on anastrozole  therapy  HISTORY OF PRESENT ILLNESS:  History of Present Illness Erin Gallagher is a 53 year old female with breast cancer who presents for follow-up on anastrozole  therapy.  She has taken anastrozole  for almost a year after switching from tamoxifen  and tolerates it well without worsening hot flashes or joint stiffness.  she is scheduled for a mammogram tomorrow.     ALLERGIES:  is allergic to other and wound dressings.  MEDICATIONS:  Current Outpatient Medications  Medication Sig Dispense Refill   amLODipine  (NORVASC ) 5 MG tablet Take 1 tablet (5 mg total) by mouth daily. 90 tablet 3   anastrozole  (ARIMIDEX ) 1 MG tablet Take 1 tablet (1 mg total) by mouth daily. 90 tablet 3   apixaban  (ELIQUIS ) 5 MG TABS tablet Take 1 tablet (5 mg total) by mouth 2 (two) times daily. 60 tablet 5   cyclobenzaprine  (FLEXERIL ) 10  MG tablet Take 1 tablet (10 mg total) by mouth at bedtime. 30 tablet 0   gabapentin  (NEURONTIN ) 100 MG capsule Take 1 capsule (100 mg total) by mouth at bedtime. 90 capsule 0    HYDROcodone -acetaminophen  (NORCO/VICODIN) 5-325 MG tablet Take 1 tablet by mouth every 6 (six) hours as needed for 5 days 20 tablet 0   losartan -hydrochlorothiazide  (HYZAAR ) 100-12.5 MG tablet Take 1 tablet by mouth daily. 90 tablet 3   medroxyPROGESTERone  (PROVERA ) 10 MG tablet Take 1 tablet (10 mg total) by mouth daily. Take for 14 days and then stop. 14 tablet 0   oxyCODONE  (ROXICODONE ) 5 MG immediate release tablet Take 1 tablet (5 mg total) by mouth every 6 (six) hours as needed for severe pain (pain score 7-10). 12 tablet 0   TYLENOL  500 MG tablet Take 500-1,000 mg by mouth every 6 (six) hours as needed for mild pain (pain score 1-3) or headache.     VITAMIN D  PO Take 1,000 Units by mouth daily.     No current facility-administered medications for this visit.    PHYSICAL EXAMINATION: ECOG PERFORMANCE STATUS: 1 - Symptomatic but completely ambulatory  There were no vitals filed for this visit. There were no vitals filed for this visit.  Physical Exam   (exam performed in the presence of a chaperone)  LABORATORY DATA:  I have reviewed the data as listed    Latest Ref Rng & Units 03/29/2024   10:49 AM 12/16/2023   12:35 AM 12/14/2023   11:01 AM  CMP  Glucose 70 - 99 mg/dL 899  892  898   BUN 6 - 20 mg/dL 10  12  9    Creatinine 0.44 - 1.00 mg/dL 9.17  9.14  9.20   Sodium 135 - 145 mmol/L 140  138  140   Potassium 3.5 - 5.1 mmol/L 4.6  3.4  4.0   Chloride 98 - 111 mmol/L 104  104  104   CO2 22 - 32 mmol/L 30  23  28    Calcium 8.9 - 10.3 mg/dL 9.8  9.5  9.5   Total Protein 6.5 - 8.1 g/dL 7.9   7.5   Total Bilirubin 0.0 - 1.2 mg/dL 0.4   0.4   Alkaline Phos 38 - 126 U/L 79   72   AST 15 - 41 U/L 26   18   ALT 0 - 44 U/L 30   23     Lab Results  Component Value Date   WBC 7.4 03/29/2024   HGB 14.3 03/29/2024   HCT 44.4 03/29/2024   MCV 79.1 (L) 03/29/2024   PLT 341 03/29/2024   NEUTROABS 4.6 03/29/2024    ASSESSMENT & PLAN:  Malignant neoplasm of upper-outer quadrant  of left breast in female, 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: Routine screening mammogram detected a 1.4cm left breast mass at the 2:30 position, no axillary adenopathy. 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  T1CN0 stage Ia clinical stage   06/14/2019: Left lumpectomy: Grade 2 IDC, 1.7 cm, high-grade DCIS, margins negative, no lymphovascular or perineural invasion, 0/2 lymph nodes negative, ER 100%, PR 100%, HER-2 negative, Ki-67 10% Oncotype DX score: 13, low risk, risk of distant recurrence at 9 years 4% Genetic testing: Negative Adjuvant radiation therapy: 07/14/2019-08/29/2019   Current treatment: Adjuvant antiestrogen therapy with tamoxifen  20 mg daily,started January 2021, switched to anastrozole  December 2024 Plan treatment duration is 7 years which will be completed January  2028    Tamoxifen  toxicities: Right lower extremity muscle aches and pains: I instructed her to take tamoxifen  at bedtime and also to take vitamin D  and tonic water. She continues to have menstrual cycles and therefore she is not yet in menopause.  She is getting a work-up for her joint aches and pains.   Breast cancer surveillance: 1.  Mammogram scheduled for 08/09/2024 2. breast exam 08/08/2024: Benign   Bone Density 02/11/22: T score +1.7   No orders of the defined types were placed in this encounter.  The patient has a good understanding of the overall plan. she agrees with it. she will call with any problems that may develop before the next visit here.  I personally spent a total of 30 minutes in the care of the patient today including preparing to see the patient, getting/reviewing separately obtained history, performing a medically appropriate exam/evaluation, counseling and educating, placing orders, referring and communicating with other health care professionals, documenting clinical information in the EHR, independently interpreting results,  communicating results, and coordinating care.   Viinay K Scout Guyett, MD 08/08/24

## 2024-08-09 ENCOUNTER — Ambulatory Visit
Admission: RE | Admit: 2024-08-09 | Discharge: 2024-08-09 | Disposition: A | Source: Ambulatory Visit | Attending: Emergency Medicine

## 2024-08-09 DIAGNOSIS — Z1231 Encounter for screening mammogram for malignant neoplasm of breast: Secondary | ICD-10-CM

## 2024-08-18 ENCOUNTER — Ambulatory Visit: Payer: Self-pay | Admitting: Obstetrics and Gynecology

## 2024-08-23 ENCOUNTER — Other Ambulatory Visit (HOSPITAL_COMMUNITY): Payer: Self-pay

## 2024-08-23 ENCOUNTER — Ambulatory Visit: Payer: Self-pay | Admitting: Obstetrics and Gynecology

## 2024-08-23 ENCOUNTER — Encounter: Payer: Self-pay | Admitting: Obstetrics and Gynecology

## 2024-08-23 VITALS — BP 158/88 | HR 96 | Ht 65.5 in | Wt 271.0 lb

## 2024-08-23 DIAGNOSIS — B029 Zoster without complications: Secondary | ICD-10-CM

## 2024-08-23 DIAGNOSIS — N2889 Other specified disorders of kidney and ureter: Secondary | ICD-10-CM

## 2024-08-23 MED ORDER — ACYCLOVIR 800 MG PO TABS
800.0000 mg | ORAL_TABLET | Freq: Every day | ORAL | 6 refills | Status: AC
  Filled 2024-08-23: qty 50, 10d supply, fill #0
  Filled 2024-09-19: qty 50, 10d supply, fill #1
  Filled 2024-09-28: qty 50, 10d supply, fill #2

## 2024-08-23 NOTE — Patient Instructions (Signed)
 Make sure to see your primary doctor for a blood pressure check. It was elevated on exam today.

## 2024-08-23 NOTE — Progress Notes (Signed)
 "  Acute Office Visit  Subjective:    Patient ID: Erin Gallagher, female    DOB: [birth year], 53 y.o.   MRN: [id number]   HPI 53 y.o. presents today for No chief complaint on file. Discuss CT results Took provera  for 2 weeks and the bleeding and pain have improved EMB Inactive endometrium  Patient's last menstrual period was 12/30/2021.    Review of Systems     Objective:    OBGyn Exam  LMP 12/30/2021  Wt Readings from Last 3 Encounters:  08/08/24 273 lb 4.8 oz (124 kg)  05/31/24 268 lb (121.6 kg)  05/03/24 270 lb (122.5 kg)      CT ABDOMEN PELVIS W WO CONTRAST (Accession [id number]) (Order [id number]) Imaging Date: 08/03/2024 Department: [facility] Released By: Fredia Montie ORN Authorizing: Glennon Almarie POUR, MD   Exam Status  Status  Final [99]   PACS Intelerad Image Link   Show images for CT ABDOMEN PELVIS W WO CONTRAST Study Result  Narrative & Impression  CLINICAL DATA:  Left lower quadrant abdominal pain   EXAM: CT ABDOMEN AND PELVIS WITHOUT AND WITH CONTRAST   TECHNIQUE: Multidetector CT imaging of the abdomen and pelvis was performed following the standard protocol before and following the bolus administration of intravenous contrast.   RADIATION DOSE REDUCTION: This exam was performed according to the departmental dose-optimization program which includes automated exposure control, adjustment of the mA and/or kV according to patient size and/or use of iterative reconstruction technique.   CONTRAST:  OMNIPAQUE  IOHEXOL  300 MG/ML  SOLN   COMPARISON:  CT abdomen and pelvis dated 12/16/2023   FINDINGS: Lower chest: No focal consolidation or pulmonary nodule in the lung bases. No pleural effusion or pneumothorax demonstrated. Partially imaged heart size is normal.   Hepatobiliary: Diffuse parenchymal hypoattenuation can be seen with hepatic steatosis. No intra or extrahepatic biliary ductal  dilation. Cholecystectomy.   Pancreas: No focal lesions or main ductal dilation.   Spleen: Normal in size without focal abnormality.   Adrenals/Urinary Tract: No adrenal nodules. No calculi or hydronephrosis. Again seen is a 1.2 cm hypodensity in the posterior right interpolar kidney (10:32), not well seen on precontrast images but hyperattenuating on postcontrast and delayed phase images. No focal bladder wall thickening.   Stomach/Bowel: Normal appearance of the stomach. No evidence of bowel wall thickening, distention, or inflammatory changes. Colonic diverticulosis without acute diverticulitis. Normal appendix.   Vascular/Lymphatic: No significant vascular findings are present. No enlarged abdominal or pelvic lymph nodes.   Reproductive: Multilobulated uterine contour corresponding to known leiomyomata. Compared to 12/16/2023, the 4.2 x 3.8 cm presumed pedunculated leiomyoma (2:69) is slightly different in configuration, now located more superiorly and medially. The remainder of leiomyomata do not appear substantially changed in size or location.   Other: No free fluid, fluid collection, or free air.   Musculoskeletal: No acute or abnormal lytic or blastic osseous lesions. Small fat-containing paraumbilical hernia.   IMPRESSION: 1. No acute abdominopelvic findings. 2. Multilobulated uterine contour corresponding to known leiomyomata. The presumed pedunculated left-sided leiomyoma is slightly different in configuration compared to 12/16/2023, now located more superiorly and medially, possibly wandering or twisting around its pedicle. The remainder of leiomyomata do not appear substantially changed in size or location. 3. A 1.2 cm hypodensity in the posterior right interpolar kidney is not well seen on precontrast images but hyperattenuating on postcontrast and delayed phase images. While this lesion may represent a hemorrhagic/proteinaceous cyst, a solid renal  neoplasm is not  excluded. Further evaluation with renal protocol MRI with and without contrast is again recommended. 4. Hepatic steatosis.     Electronically Signed   By: Limin  Xu M.D.   On: 08/05/2024 11:32      Result History  CT ABDOMEN PELVIS W WO CONTRAST (Order #[id number]) on 08/05/2024 - Order Result History Report    CT ABDOMEN PELVIS W WO CONTRAST: Result Notes   Back with pustules along dermatone  Assessment & Plan:  Renal mass Fibroids Likely torsed fibroid Shingles  Patient feels better and discussed treatment options for bleeding and fibroid Referral to urology/nephrology for renal mass Acyclovir  sent for shingles. To keep covered until completely heals. She is out of work until the end of Jan. Avoid contact and wash hands 30 minutes spent on reviewing records, imaging,  and one on one patient time and counseling patient and documentation Dr. Glennon  Dr. Glennon Almarie MARLA Glennon "

## 2024-09-19 ENCOUNTER — Other Ambulatory Visit (HOSPITAL_COMMUNITY): Payer: Self-pay

## 2024-09-21 ENCOUNTER — Other Ambulatory Visit (HOSPITAL_COMMUNITY): Payer: Self-pay

## 2024-09-21 ENCOUNTER — Ambulatory Visit: Payer: Self-pay | Admitting: Emergency Medicine

## 2024-09-21 VITALS — BP 128/90 | HR 96 | Temp 98.3°F | Ht 65.5 in | Wt 276.2 lb

## 2024-09-21 DIAGNOSIS — J22 Unspecified acute lower respiratory infection: Secondary | ICD-10-CM | POA: Insufficient documentation

## 2024-09-21 DIAGNOSIS — J040 Acute laryngitis: Secondary | ICD-10-CM | POA: Diagnosis not present

## 2024-09-21 DIAGNOSIS — R053 Chronic cough: Secondary | ICD-10-CM | POA: Insufficient documentation

## 2024-09-21 MED ORDER — HYDROCODONE BIT-HOMATROP MBR 5-1.5 MG/5ML PO SOLN
5.0000 mL | Freq: Every evening | ORAL | 0 refills | Status: AC | PRN
  Filled 2024-09-21: qty 180, 36d supply, fill #0

## 2024-09-21 MED ORDER — METHYLPREDNISOLONE 4 MG PO TBPK
ORAL_TABLET | ORAL | 1 refills | Status: AC
  Filled 2024-09-21: qty 21, 6d supply, fill #0

## 2024-09-21 MED ORDER — AMOXICILLIN-POT CLAVULANATE 875-125 MG PO TABS
1.0000 | ORAL_TABLET | Freq: Two times a day (BID) | ORAL | 0 refills | Status: AC
  Filled 2024-09-21: qty 14, 7d supply, fill #0

## 2024-09-21 NOTE — Progress Notes (Signed)
 Erin Gallagher 54 y.o.   Chief Complaint  Patient presents with   Cough    Cough, headaches    HISTORY OF PRESENT ILLNESS: This is a 54 y.o. female complaining of flulike symptoms that started 2 weeks ago and not getting better Complaining of cough, headache, fatigue, and difficulty breathing Hoarse voice also Has been taking Mucinex , DayQuil and NyQuil No other associated symptoms Husband also sick with similar symptoms No other complaints or medical concerns today.  Cough Associated symptoms include a sore throat. Pertinent negatives include no chest pain, chills, fever, headaches or rash.     Prior to Admission medications  Medication Sig Start Date End Date Taking? Authorizing Provider  acyclovir  (ZOVIRAX ) 800 MG tablet Take 1 tablet (800 mg total) by mouth 5 (five) times daily for 10 days. 08/23/24 09/29/24 Yes Glennon Almarie POUR, MD  amLODipine  (NORVASC ) 5 MG tablet Take 1 tablet (5 mg total) by mouth daily. 03/21/24  Yes Kyeisha Janowicz, Emil Schanz, MD  anastrozole  (ARIMIDEX ) 1 MG tablet Take 1 tablet (1 mg total) by mouth daily. 08/08/24  Yes Gudena, Vinay, MD  apixaban  (ELIQUIS ) 5 MG TABS tablet Take 1 tablet (5 mg total) by mouth 2 (two) times daily. 09/16/23  Yes Causey, Morna Pickle, NP  cyclobenzaprine  (FLEXERIL ) 10 MG tablet Take 1 tablet (10 mg total) by mouth at bedtime. 07/01/23  Yes Phoebie Shad, Emil Schanz, MD  gabapentin  (NEURONTIN ) 100 MG capsule Take 1 capsule (100 mg total) by mouth at bedtime. 01/11/24  Yes   HYDROcodone -acetaminophen  (NORCO/VICODIN) 5-325 MG tablet Take 1 tablet by mouth every 6 (six) hours as needed for 5 days 10/09/23  Yes   losartan -hydrochlorothiazide  (HYZAAR ) 100-12.5 MG tablet Take 1 tablet by mouth daily. 03/21/24  Yes Tennyson Kallen, Emil Schanz, MD  TYLENOL  500 MG tablet Take 500-1,000 mg by mouth every 6 (six) hours as needed for mild pain (pain score 1-3) or headache.   Yes [provider]  VITAMIN D  PO Take 1,000 Units by mouth daily.    Yes [provider]    Allergies[1]  Patient Active Problem List   Diagnosis Date Noted   History of pulmonary embolism 12/14/2023   Hot flashes 12/14/2023   Pulmonary embolism (HCC) 08/20/2023   Pulmonary embolism without acute cor pulmonale (HCC) 08/19/2023   Left leg claudication 08/19/2023   Prolonged QT interval 08/19/2023   Degeneration of intervertebral disc of lumbar region with discogenic back pain and lower extremity pain 07/29/2023   Bilateral swelling of feet and ankles 03/11/2023   Metabolic syndrome 02/08/2023   Situational hypertension 02/02/2023   Family history of coronary artery disease 12/18/2022   Pes planus 05/13/2022   ANA positive 01/20/2022   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   Suspected sleep apnea 10/30/2021   BMI 40.0-44.9, adult (HCC) 02/06/2021   Morbid obesity (HCC) 02/06/2021   History of breast cancer 02/06/2021   Malignant neoplasm of upper-outer quadrant of left breast in female, 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   Arthritis 11/23/2017   Leg pain, diffuse, left 11/28/2014   Essential hypertension 06/22/2013    Past Medical History:  Diagnosis Date   ANA positive 01/20/2022   Anxiety 08/2023   During hospital stay   Arthritis 09/2021   Blood transfusion without reported diagnosis 08/1996   Cancer (HCC) 05/2019   left breast cancer   Family history of breast cancer    Family history of colon cancer    Family history of lung cancer    Family history of throat cancer  Fibroid    torsed on CT scan   Gall bladder disease    gall bladder removal   Hypertension    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   Personal history of radiation therapy    Shingles    2025 on back   Sleep apnea     Past Surgical History:  Procedure Laterality Date   BREAST BIOPSY Left 05/16/2019   BREAST LUMPECTOMY Left 06/14/2019   BREAST LUMPECTOMY WITH RADIOACTIVE SEED AND SENTINEL LYMPH NODE BIOPSY Left 06/14/2019    Procedure: LEFT BREAST LUMPECTOMY WITH RADIOACTIVE SEED AND SENTINEL LYMPH NODE BIOPSY;  Surgeon: Aron Shoulders, MD;  Location: [facility];  Service: General;  Laterality: Left;   CALCANEAL OSTEOTOMY Right 10/01/2023   Procedure: CALCANEAL OSTEOTOMY;  Surgeon: Kit Rush, MD;  Location: [facility];  Service: Orthopedics;  Laterality: Right;   CESAREAN SECTION  09/1996   1997   CHOLECYSTECTOMY  09/11   FOOT SURGERY Right    GASTROCNEMIUS RECESSION Right 10/01/2023   Procedure: GASTROCNEMIUS RECESSION;  Surgeon: Kit Rush, MD;  Location: [facility];  Service: Orthopedics;  Laterality: Right;   POSTERIOR TIBIAL TENDON REPAIR Right 10/01/2023   Procedure: POSTERIOR TIBIAL TENDON TENOLYSIS;  Surgeon: Kit Rush, MD;  Location: [facility];  Service: Orthopedics;  Laterality: Right;   TUBAL LIGATION  10/1996   1998    Social History   Socioeconomic History   Marital status: Married    Spouse name: Not on file   Number of children: 1   Years of education: Not on file   Highest education level: 12th grade  Occupational History   Occupation: Merchandiser, Retail  Tobacco Use   Smoking status: Never    Passive exposure: Current   Smokeless tobacco: Never  Vaping Use   Vaping status: Never Used  Substance and Sexual Activity   Alcohol use: Yes    Alcohol/week: 2.0 standard drinks of alcohol    Types: 2 Standard drinks or equivalent per week    Comment: occasionally - 2 out of a 7 day period   Drug use: No   Sexual activity: Yes    Birth control/protection: Surgical    Comment: Tubal ligation  Other Topics Concern   Not on file  Social History Narrative   Caffiene 1 cup coffee daily.    Lives home with spouse   Works at [employer]   Education 46yrs   Children one.    Social Drivers of Health   Tobacco Use: Medium Risk (09/21/2024)   Patient History    Smoking Tobacco Use: Never    Smokeless Tobacco Use: Never     Passive Exposure: Current  Financial Resource Strain: Low Risk (09/21/2024)   Overall Financial Resource Strain (CARDIA)    Difficulty of Paying Living Expenses: Not very hard  Food Insecurity: No Food Insecurity (09/21/2024)   Epic    Worried About Programme Researcher, Broadcasting/film/video in the Last Year: Never true    Ran Out of Food in the Last Year: Never true  Transportation Needs: No Transportation Needs (09/21/2024)   Epic    Lack of Transportation (Medical): No    Lack of Transportation (Non-Medical): No  Physical Activity: Insufficiently Active (09/21/2024)   Exercise Vital Sign    Days of Exercise per Week: 3 days    Minutes of Exercise per Session: 40 min  Stress: Stress Concern Present (09/21/2024)   [employer] of Occupational Health - Occupational Stress Questionnaire    Feeling  of Stress: To some extent  Social Connections: Unknown (09/21/2024)   Social Connection and Isolation Panel    Frequency of Communication with Friends and Family: More than three times a week    Frequency of Social Gatherings with Friends and Family: Twice a week    Attends Religious Services: 1 to 4 times per year    Active Member of [employer] or Organizations: Patient declined    Attends Banker Meetings: Not on file    Marital Status: Married  Intimate Partner Violence: Not At Risk (08/20/2023)   Humiliation, Afraid, Rape, and Kick questionnaire    Fear of Current or Ex-Partner: No    Emotionally Abused: No    Physically Abused: No    Sexually Abused: No  Depression (PHQ2-9): Low Risk (05/03/2024)   Depression (PHQ2-9)    PHQ-2 Score: 1  Alcohol Screen: Low Risk (09/21/2024)   Alcohol Screen    Last Alcohol Screening Score (AUDIT): 4  Housing: Unknown (09/21/2024)   Epic    Unable to Pay for Housing in the Last Year: No    Number of Times Moved in the Last Year: Not on file    Homeless in the Last Year: No  Utilities: Not At Risk (08/20/2023)   [facility] Utilities    Threatened with loss of  utilities: No  Health Literacy: Not on file    Family History  Problem Relation Age of Onset   Hypertension Mother    Hypertension Father    Colon cancer Father        dx 8s   Cancer Father    Throat cancer Maternal Uncle    Cancer Maternal Uncle    Lung cancer Paternal Uncle    Stroke Maternal Grandmother    Hypertension Maternal Grandmother    Aneurysm Maternal Grandmother    Stroke Maternal Grandfather    Healthy Son      Review of Systems  Constitutional:  Negative for chills and fever.  HENT:  Positive for congestion and sore throat.   Respiratory:  Positive for cough.   Cardiovascular:  Negative for chest pain and palpitations.  Gastrointestinal:  Negative for abdominal pain, diarrhea, nausea and vomiting.  Genitourinary: Negative.  Negative for dysuria and hematuria.  Skin:  Negative for rash.  Neurological: Negative.  Negative for dizziness and headaches.  All other systems reviewed and are negative.   Vitals:   09/21/24 1537  BP: (!) 128/90  Pulse: 96  Temp: 98.3 F (36.8 C)  SpO2: 99%    Physical Exam Vitals reviewed.  Constitutional:      Appearance: Normal appearance.  HENT:     Head: Normocephalic.     Mouth/Throat:     Mouth: Mucous membranes are moist.     Pharynx: Oropharynx is clear.  Eyes:     Extraocular Movements: Extraocular movements intact.     Pupils: Pupils are equal, round, and reactive to light.  Cardiovascular:     Rate and Rhythm: Normal rate and regular rhythm.     Pulses: Normal pulses.     Heart sounds: Normal heart sounds.  Pulmonary:     Effort: Pulmonary effort is normal.     Breath sounds: Normal breath sounds.  Musculoskeletal:     Cervical back: No tenderness.  Lymphadenopathy:     Cervical: No cervical adenopathy.  Skin:    General: Skin is warm and dry.     Capillary Refill: Capillary refill takes less than 2 seconds.  Neurological:  General: No focal deficit present.     Mental Status: She is alert and  oriented to person, place, and time.  Psychiatric:        Mood and Affect: Mood normal.        Behavior: Behavior normal.      ASSESSMENT & PLAN: Problem List Items Addressed This Visit       Respiratory   Acute laryngitis   Recommend to start antibiotic Augmentin  875 twice a day Start Medrol  Dosepak Symptom management discussed      Relevant Medications   amoxicillin -clavulanate (AUGMENTIN ) 875-125 MG tablet   methylPREDNISolone  (MEDROL  DOSEPAK) 4 MG TBPK tablet   Lower respiratory infection - Primary   Open viral infection now with secondary bacterial infection Clinically stable.  No signs of pneumonia. Differential diagnosis discussed Recommend daily Augmentin  875 twice a day for 7 to 10 days Symptom management discussed Advised to rest and stay well-hydrated Advised to contact the office if no better or worse during the next several days      Relevant Medications   amoxicillin -clavulanate (AUGMENTIN ) 875-125 MG tablet     Other   Persistent cough   Cough management discussed. Continue Mucinex  DM and cough drops Hycodan syrup at bedtime Advised to rest and stay well-hydrated      Relevant Medications   HYDROcodone  bit-homatropine (HYCODAN) 5-1.5 MG/5ML syrup   Patient Instructions  Acute Bronchitis, Adult  Acute bronchitis is when air tubes in the lungs (bronchi) suddenly get swollen. The condition can make it hard for you to breathe. In adults, acute bronchitis usually goes away within 2 weeks. A cough caused by bronchitis may last up to 3 weeks. Smoking, allergies, and asthma can make the condition worse. What are the causes? Germs that cause cold and flu (viruses). The most common cause of this condition is the virus that causes the common cold. Bacteria. Substances that bother (irritate) the lungs, including: Smoke from cigarettes and other types of tobacco. Dust and pollen. Fumes from chemicals, gases, or burned fuel. Indoor or outdoor air  pollution. What increases the risk? A weak body's defense system. This is also called the immune system. Any condition that affects your lungs and breathing, such as asthma. What are the signs or symptoms? A cough. Coughing up clear, yellow, or green mucus. Making high-pitched whistling sounds when you breathe, most often when you breathe out (wheezing). Runny or stuffy nose. Having too much mucus in your lungs (chest congestion). Shortness of breath. Body aches. A sore throat. How is this treated? Acute bronchitis may go away over time without treatment. Your doctor may tell you to: Drink more fluids. This will help thin your mucus so it is easier to cough up. Use a device that gets medicine into your lungs (inhaler). Use a vaporizer or a humidifier. These are machines that add water to the air. This helps with coughing and poor breathing. Take a medicine that thins mucus and helps clear it from your lungs. Take a medicine that prevents or stops coughing. It is not common to take an antibiotic medicine for this condition. Follow these instructions at home:  Take over-the-counter and prescription medicines only as told by your doctor. Use an inhaler, vaporizer, or humidifier as told by your doctor. Take two teaspoons (10 mL) of honey at bedtime. This helps lessen your coughing at night. Drink enough fluid to keep your pee (urine) pale yellow. Do not smoke or use any products that contain nicotine or tobacco. If you need help  quitting, ask your doctor. Get a lot of rest. Return to your normal activities when your doctor says that it is safe. Keep all follow-up visits. How is this prevented?  Wash your hands often with soap and water for at least 20 seconds. If you cannot use soap and water, use hand sanitizer. Avoid contact with people who have cold symptoms. Try not to touch your mouth, nose, or eyes with your hands. Avoid breathing in smoke or chemical fumes. Make sure to get  the flu shot every year. Contact a doctor if: Your symptoms do not get better in 2 weeks. You have trouble coughing up the mucus. Your cough keeps you awake at night. You have a fever. Get help right away if: You cough up blood. You have chest pain. You have very bad shortness of breath. You faint or keep feeling like you are going to faint. You have a very bad headache. Your fever or chills get worse. These symptoms may be an emergency. Get help right away. Call your local emergency services (911 in the [place] Do not wait to see if the symptoms will go away. Do not drive yourself to the hospital. Summary Acute bronchitis is when air tubes in the lungs (bronchi) suddenly get swollen. In adults, acute bronchitis usually goes away within 2 weeks. Drink more fluids. This will help thin your mucus so it is easier to cough up. Take over-the-counter and prescription medicines only as told by your doctor. Contact a doctor if your symptoms do not improve after 2 weeks of treatment. This information is not intended to replace advice given to you by your health care provider. Make sure you discuss any questions you have with your health care provider. Document Revised: 12/19/2020 Document Reviewed: 12/19/2020 Elsevier Patient Education  2024 Elsevier Inc.    Emil Schaumann, MD [facility]    [1]  Allergies Allergen Reactions   Other Itching, Rash and Other (See Comments)    Steri Strips cause itching and a rash   Wound Dressings Rash

## 2024-09-21 NOTE — Assessment & Plan Note (Signed)
 Recommend to start antibiotic Augmentin  875 twice a day Start Medrol  Dosepak Symptom management discussed

## 2024-09-21 NOTE — Patient Instructions (Signed)
 Acute Bronchitis, Adult  Acute bronchitis is when air tubes in the lungs (bronchi) suddenly get swollen. The condition can make it hard for you to breathe. In adults, acute bronchitis usually goes away within 2 weeks. A cough caused by bronchitis may last up to 3 weeks. Smoking, allergies, and asthma can make the condition worse. What are the causes? Germs that cause cold and flu (viruses). The most common cause of this condition is the virus that causes the common cold. Bacteria. Substances that bother (irritate) the lungs, including: Smoke from cigarettes and other types of tobacco. Dust and pollen. Fumes from chemicals, gases, or burned fuel. Indoor or outdoor air pollution. What increases the risk? A weak body's defense system. This is also called the immune system. Any condition that affects your lungs and breathing, such as asthma. What are the signs or symptoms? A cough. Coughing up clear, yellow, or green mucus. Making high-pitched whistling sounds when you breathe, most often when you breathe out (wheezing). Runny or stuffy nose. Having too much mucus in your lungs (chest congestion). Shortness of breath. Body aches. A sore throat. How is this treated? Acute bronchitis may go away over time without treatment. Your doctor may tell you to: Drink more fluids. This will help thin your mucus so it is easier to cough up. Use a device that gets medicine into your lungs (inhaler). Use a vaporizer or a humidifier. These are machines that add water to the air. This helps with coughing and poor breathing. Take a medicine that thins mucus and helps clear it from your lungs. Take a medicine that prevents or stops coughing. It is not common to take an antibiotic medicine for this condition. Follow these instructions at home:  Take over-the-counter and prescription medicines only as told by your doctor. Use an inhaler, vaporizer, or humidifier as told by your doctor. Take two teaspoons  (10 mL) of honey at bedtime. This helps lessen your coughing at night. Drink enough fluid to keep your pee (urine) pale yellow. Do not smoke or use any products that contain nicotine or tobacco. If you need help quitting, ask your doctor. Get a lot of rest. Return to your normal activities when your doctor says that it is safe. Keep all follow-up visits. How is this prevented?  Wash your hands often with soap and water for at least 20 seconds. If you cannot use soap and water, use hand sanitizer. Avoid contact with people who have cold symptoms. Try not to touch your mouth, nose, or eyes with your hands. Avoid breathing in smoke or chemical fumes. Make sure to get the flu shot every year. Contact a doctor if: Your symptoms do not get better in 2 weeks. You have trouble coughing up the mucus. Your cough keeps you awake at night. You have a fever. Get help right away if: You cough up blood. You have chest pain. You have very bad shortness of breath. You faint or keep feeling like you are going to faint. You have a very bad headache. Your fever or chills get worse. These symptoms may be an emergency. Get help right away. Call your local emergency services (911 in the [place] Do not wait to see if the symptoms will go away. Do not drive yourself to the hospital. Summary Acute bronchitis is when air tubes in the lungs (bronchi) suddenly get swollen. In adults, acute bronchitis usually goes away within 2 weeks. Drink more fluids. This will help thin your mucus so it is easier  to cough up. Take over-the-counter and prescription medicines only as told by your doctor. Contact a doctor if your symptoms do not improve after 2 weeks of treatment. This information is not intended to replace advice given to you by your health care provider. Make sure you discuss any questions you have with your health care provider. Document Revised: 12/19/2020 Document Reviewed: 12/19/2020 Elsevier Patient  Education  2024 [employer].

## 2024-09-21 NOTE — Assessment & Plan Note (Signed)
 Cough management discussed. Continue Mucinex  DM and cough drops Hycodan syrup at bedtime Advised to rest and stay well-hydrated

## 2024-09-21 NOTE — Assessment & Plan Note (Signed)
 Open viral infection now with secondary bacterial infection Clinically stable.  No signs of pneumonia. Differential diagnosis discussed Recommend daily Augmentin  875 twice a day for 7 to 10 days Symptom management discussed Advised to rest and stay well-hydrated Advised to contact the office if no better or worse during the next several days

## 2024-09-28 ENCOUNTER — Other Ambulatory Visit: Payer: Self-pay | Admitting: Adult Health

## 2024-09-28 ENCOUNTER — Other Ambulatory Visit: Payer: Self-pay

## 2024-09-28 DIAGNOSIS — I2692 Saddle embolus of pulmonary artery without acute cor pulmonale: Secondary | ICD-10-CM

## 2024-09-28 DIAGNOSIS — C50412 Malignant neoplasm of upper-outer quadrant of left female breast: Secondary | ICD-10-CM

## 2024-09-29 ENCOUNTER — Inpatient Hospital Stay: Attending: Hematology and Oncology

## 2024-09-29 ENCOUNTER — Inpatient Hospital Stay (HOSPITAL_BASED_OUTPATIENT_CLINIC_OR_DEPARTMENT_OTHER): Admitting: Adult Health

## 2024-09-29 ENCOUNTER — Other Ambulatory Visit (HOSPITAL_COMMUNITY): Payer: Self-pay

## 2024-09-29 ENCOUNTER — Encounter: Payer: Self-pay | Admitting: Adult Health

## 2024-09-29 ENCOUNTER — Other Ambulatory Visit: Payer: Self-pay

## 2024-09-29 VITALS — BP 140/87 | HR 88 | Temp 97.6°F | Resp 18 | Wt 275.1 lb

## 2024-09-29 DIAGNOSIS — Z801 Family history of malignant neoplasm of trachea, bronchus and lung: Secondary | ICD-10-CM | POA: Diagnosis not present

## 2024-09-29 DIAGNOSIS — Z808 Family history of malignant neoplasm of other organs or systems: Secondary | ICD-10-CM | POA: Insufficient documentation

## 2024-09-29 DIAGNOSIS — Z79811 Long term (current) use of aromatase inhibitors: Secondary | ICD-10-CM | POA: Insufficient documentation

## 2024-09-29 DIAGNOSIS — N63 Unspecified lump in unspecified breast: Secondary | ICD-10-CM

## 2024-09-29 DIAGNOSIS — Z923 Personal history of irradiation: Secondary | ICD-10-CM | POA: Diagnosis not present

## 2024-09-29 DIAGNOSIS — Z1721 Progesterone receptor positive status: Secondary | ICD-10-CM | POA: Insufficient documentation

## 2024-09-29 DIAGNOSIS — Z803 Family history of malignant neoplasm of breast: Secondary | ICD-10-CM | POA: Insufficient documentation

## 2024-09-29 DIAGNOSIS — Z1732 Human epidermal growth factor receptor 2 negative status: Secondary | ICD-10-CM | POA: Diagnosis not present

## 2024-09-29 DIAGNOSIS — Z7901 Long term (current) use of anticoagulants: Secondary | ICD-10-CM | POA: Diagnosis not present

## 2024-09-29 DIAGNOSIS — Z170 Estrogen receptor positive status [ER+]: Secondary | ICD-10-CM

## 2024-09-29 DIAGNOSIS — Z86711 Personal history of pulmonary embolism: Secondary | ICD-10-CM | POA: Diagnosis not present

## 2024-09-29 DIAGNOSIS — C50412 Malignant neoplasm of upper-outer quadrant of left female breast: Secondary | ICD-10-CM | POA: Diagnosis present

## 2024-09-29 DIAGNOSIS — Z800 Family history of malignant neoplasm of digestive organs: Secondary | ICD-10-CM | POA: Insufficient documentation

## 2024-09-29 DIAGNOSIS — G4452 New daily persistent headache (NDPH): Secondary | ICD-10-CM | POA: Insufficient documentation

## 2024-09-29 DIAGNOSIS — N6320 Unspecified lump in the left breast, unspecified quadrant: Secondary | ICD-10-CM | POA: Diagnosis not present

## 2024-09-29 LAB — CMP (CANCER CENTER ONLY)
ALT: 29 U/L (ref 0–44)
AST: 23 U/L (ref 15–41)
Albumin: 4.3 g/dL (ref 3.5–5.0)
Alkaline Phosphatase: 86 U/L (ref 38–126)
Anion gap: 9 (ref 5–15)
BUN: 13 mg/dL (ref 6–20)
CO2: 29 mmol/L (ref 22–32)
Calcium: 9.3 mg/dL (ref 8.9–10.3)
Chloride: 102 mmol/L (ref 98–111)
Creatinine: 0.77 mg/dL (ref 0.44–1.00)
GFR, Estimated: >60 mL/min
Glucose, Bld: 96 mg/dL (ref 70–99)
Potassium: 4.6 mmol/L (ref 3.5–5.1)
Sodium: 140 mmol/L (ref 135–145)
Total Bilirubin: 0.3 mg/dL (ref 0.0–1.2)
Total Protein: 7.8 g/dL (ref 6.5–8.1)

## 2024-09-29 LAB — CBC WITH DIFFERENTIAL (CANCER CENTER ONLY)
Abs Immature Granulocytes: 0.02 10*3/uL (ref 0.00–0.07)
Basophils Absolute: 0 10*3/uL (ref 0.0–0.1)
Basophils Relative: 0 %
Eosinophils Absolute: 0.1 10*3/uL (ref 0.0–0.5)
Eosinophils Relative: 1 %
HCT: 44.2 % (ref 36.0–46.0)
Hemoglobin: 14.2 g/dL (ref 12.0–15.0)
Immature Granulocytes: 0 %
Lymphocytes Relative: 28 %
Lymphs Abs: 2.6 10*3/uL (ref 0.7–4.0)
MCH: 25.9 pg — ABNORMAL LOW (ref 26.0–34.0)
MCHC: 32.1 g/dL (ref 30.0–36.0)
MCV: 80.5 fL (ref 80.0–100.0)
Monocytes Absolute: 0.5 10*3/uL (ref 0.1–1.0)
Monocytes Relative: 5 %
Neutro Abs: 6.1 10*3/uL (ref 1.7–7.7)
Neutrophils Relative %: 66 %
Platelet Count: 322 10*3/uL (ref 150–400)
RBC: 5.49 MIL/uL — ABNORMAL HIGH (ref 3.87–5.11)
RDW: 15.2 % (ref 11.5–15.5)
WBC Count: 9.4 10*3/uL (ref 4.0–10.5)
nRBC: 0 % (ref 0.0–0.2)

## 2024-09-29 MED ORDER — APIXABAN 5 MG PO TABS
5.0000 mg | ORAL_TABLET | Freq: Two times a day (BID) | ORAL | 5 refills | Status: AC
  Filled 2024-09-29: qty 60, 30d supply, fill #0

## 2024-09-29 NOTE — Progress Notes (Signed)
 [name]:    Erin Emil Schanz, MD [address] [place] [address]   DIAGNOSIS: Cancer Staging  Malignant neoplasm of upper-outer quadrant of left breast in female, estrogen receptor positive (HCC) Staging form: Breast, AJCC 8th Edition - Clinical stage from 05/25/2019: Stage IA (cT1c, cN0, cM0, G1, ER+, PR+, HER2-) - Unsigned Stage prefix: Initial diagnosis Method of lymph node assessment: Clinical Histologic grading system: 3 grade system - Pathologic stage from 06/29/2019: Stage IA (pT1c, pN0(sn), cM0, G2, ER+, PR+, HER2-, Oncotype DX score: 13) - Unsigned Stage prefix: Initial diagnosis Method of lymph node assessment: Sentinel lymph node biopsy Multigene prognostic tests performed: Oncotype DX Recurrence score range: Greater than or equal to 11 Histologic grading system: 3 grade system    SUMMARY OF ONCOLOGIC HISTORY: Oncology History  Malignant neoplasm of upper-outer quadrant of left breast in female, estrogen receptor positive (HCC)  05/20/2019 Initial Diagnosis   Routine screening mammogram detected a 1.4cm left breast mass at the 2:30 position, no axillary adenopathy. 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    06/05/2019 Genetic Testing   Negative genetic testing. No pathogenic variants identified on the [facility] Common Hereditary Cancers Panel + STAT Breast Cancer panel.The STAT Breast cancer panel offered by [facility] includes sequencing and rearrangement analysis for the following 9 genes:  ATM, BRCA1, BRCA2, CDH1, CHEK2, PALB2, PTEN, STK11 and TP53. The Common Hereditary Cancers Panel offered by [facility] includes sequencing and/or deletion duplication testing of the following 47 genes: APC, ATM, AXIN2, BARD1, BMPR1A, BRCA1, BRCA2, BRIP1, CDH1, CDKN2A (p14ARF), CDKN2A (p16INK4a), CKD4, CHEK2, CTNNA1, DICER1, EPCAM (Deletion/duplication testing only), GREM1 (promoter region  deletion/duplication testing only), KIT, MEN1, MLH1, MSH2, MSH3, MSH6, MUTYH, NBN, NF1, NHTL1, PALB2, PDGFRA, PMS2, POLD1, POLE, PTEN, RAD50, RAD51C, RAD51D, SDHB, SDHC, SDHD, SMAD4, SMARCA4. STK11, TP53, TSC1, TSC2, and VHL.  The following genes were evaluated for sequence changes only: SDHA and HOXB13 c.251G>A variant only. The report date is 06/05/2019.    06/14/2019 Surgery   Left lumpectomy Erin Gallagher) [phone]): IDC, grade 2, with high grade DCIS, 1.7cm, clear margins, 2 left axillary lymph nodes negative for carcinoma.    06/24/2019 Oncotype testing   Recurrence score: 13; low risk, distant recurrence at 9 years: 4%   07/13/2019 - 08/31/2019 Radiation Therapy   The patient initially received a dose of 50.4 Gy in 28 fractions to the breast using whole-breast tangent fields. This was delivered using a 3-D conformal technique. The patient then received a boost to the seroma. This delivered an additional 10 Gy in 5 fractions using a 3-field photon boost technique. The total dose was 60.4 Gy.   09/2019 - 08/2023 Anti-estrogen oral therapy   Tamoxifen --stopped early due to pulmonary embolus   10/2023 -  Anti-estrogen oral therapy   Anastrozole  daily     CURRENT THERAPY: anastrozole   INTERVAL HISTORY:  Discussed the use of AI scribe software for clinical note transcription with the patient, who gave verbal consent to proceed.  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  She underwent left breast lumpectomy with adjuvant whole breast radiation and later transitioned from tamoxifen  to anastrozole  in February 2025 after a pulmonary embolism. She takes daily anastrozole  and Eliquis . Her last mammogram on  August 09, 2024 showed no  malignancy, breast density category B. She has had no abnormal bleeding or bruising on anticoagulation.  Since December 2025 she has had new daily headaches described as slight but annoying. They improve with quiet, darkness, and occasional Tylenol , which she avoids using daily. She links some headaches to stress. She denies recent head trauma or falls. She is worried about family aneurysm history and possible metastatic disease. She notes subjective memory difficulty that she attributes to aging.  She describes a focal painful area near her left breast close to the ribs, with a puncture-like sensation rather than a discrete hard lump. The area is intermittently tender. She remains concerned about this site and requests further imaging to clarify the cause.  She continues to work and manage family responsibilities amid psychosocial stressors related to family transitions.      Patient Active Problem List   Diagnosis Date Noted   Acute laryngitis 09/21/2024   Lower respiratory infection 09/21/2024   Persistent cough 09/21/2024   History of pulmonary embolism 12/14/2023   Hot flashes 12/14/2023   Pulmonary embolism (HCC) 08/20/2023   Pulmonary embolism without acute cor pulmonale (HCC) 08/19/2023   Left leg claudication 08/19/2023   Prolonged QT interval 08/19/2023   Degeneration of intervertebral disc of lumbar region with discogenic back pain and lower extremity pain 07/29/2023   Bilateral swelling of feet and ankles 03/11/2023   Metabolic syndrome 02/08/2023   Situational hypertension 02/02/2023   Family history of coronary artery disease 12/18/2022   Pes planus 05/13/2022   ANA positive 01/20/2022   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   Personal history of radiation therapy    Shingles    2025 on back   Sleep apnea     SURGICAL HISTORY: Past Surgical History:  Procedure Laterality Date   BREAST BIOPSY Left 05/16/2019   BREAST LUMPECTOMY Left 06/14/2019   BREAST LUMPECTOMY WITH RADIOACTIVE SEED AND SENTINEL LYMPH NODE BIOPSY Left 06/14/2019   Procedure: LEFT BREAST LUMPECTOMY WITH RADIOACTIVE SEED AND SENTINEL LYMPH NODE BIOPSY;  Surgeon: Aron Shoulders, MD;  Location: [facility];  Service: General;  Laterality: Left;   CALCANEAL OSTEOTOMY Right 10/01/2023   Procedure: CALCANEAL OSTEOTOMY;  Surgeon: Kit Rush, MD;  Location: [facility];  Service: Orthopedics;  Laterality: Right;   CESAREAN SECTION  09/1996   1997   CHOLECYSTECTOMY  09/11   FOOT SURGERY Right    GASTROCNEMIUS RECESSION Right 10/01/2023  Procedure: GASTROCNEMIUS RECESSION;  Surgeon: Kit Rush, MD;  Location: [facility];  Service: Orthopedics;  Laterality: Right;   POSTERIOR TIBIAL TENDON REPAIR Right 10/01/2023   Procedure: POSTERIOR TIBIAL TENDON TENOLYSIS;  Surgeon: Kit Rush, MD;  Location: [facility];  Service: Orthopedics;  Laterality: Right;   TUBAL LIGATION  10/1996   1998     SOCIAL HISTORY: Social History   Socioeconomic History   Marital status: Married    Spouse name: Not on file   Number of children: 1   Years of education: Not on file   Highest education level: 12th grade  Occupational History   Occupation: Merchandiser, Retail  Tobacco Use   Smoking status: Never    Passive exposure: Current   Smokeless tobacco: Never  Vaping Use   Vaping status: Never Used  Substance and Sexual Activity   Alcohol use: Yes    Alcohol/week: 2.0 standard drinks of alcohol    Types: 2 Standard drinks or equivalent per week    Comment: occasionally - 2 out of a 7 day period   Drug use: No   Sexual activity: Yes    Birth control/protection: Surgical    Comment: Tubal ligation  Other Topics Concern   Not on file  Social History Narrative   Caffiene 1 cup coffee daily.    Lives home with spouse   Works at [employer]   Education 39yrs   Children one.    Social Drivers of Health   Tobacco Use: Medium Risk (09/29/2024)   Patient History    Smoking Tobacco Use: Never    Smokeless Tobacco Use: Never    Passive Exposure: Current  Financial Resource Strain: Low Risk (09/21/2024)   Overall Financial Resource Strain (CARDIA)    Difficulty of Paying Living Expenses: Not very hard  Food Insecurity: No Food Insecurity (09/21/2024)   Epic    Worried About Programme Researcher, Broadcasting/film/video in the Last Year: Never true    Ran Out of Food in the Last Year: Never true  Transportation Needs: No Transportation Needs (09/21/2024)   Epic    Lack of Transportation (Medical): No    Lack of Transportation (Non-Medical): No  Physical Activity: Insufficiently Active (09/21/2024)   Exercise Vital Sign    Days of Exercise per Week: 3 days    Minutes of Exercise per Session: 40 min  Stress: Stress Concern Present (09/21/2024)   [employer] of Occupational Health - Occupational Stress Questionnaire    Feeling of Stress: To some extent  Social Connections: Unknown (09/21/2024)   Social  Connection and Isolation Panel    Frequency of Communication with Friends and Family: More than three times a week    Frequency of Social Gatherings with Friends and Family: Twice a week    Attends Religious Services: 1 to 4 times per year    Active Member of [employer] or Organizations: Patient declined    Attends Banker Meetings: Not on file    Marital Status: Married  Intimate Partner Violence: Not At Risk (09/29/2024)   Epic    Fear of Current or Ex-Partner: No    Emotionally Abused: No    Physically Abused: No    Sexually Abused: No  Depression (PHQ2-9): Low Risk (09/29/2024)   Depression (PHQ2-9)    PHQ-2 Score: 0  Alcohol Screen: Low Risk (09/21/2024)   Alcohol Screen    Last Alcohol Screening Score (AUDIT): 4  Housing: Unknown (09/21/2024)   Epic    Unable to  Pay for Housing in the Last Year: No    Number of Times Moved in the Last Year: Not on file    Homeless in the Last Year: No  Utilities: Not At Risk (09/29/2024)   Epic    Threatened with loss of utilities: No  Health Literacy: Adequate Health Literacy (09/29/2024)   B1300 Health Literacy    Frequency of need for help with medical instructions: Never    FAMILY HISTORY: Family History  Problem Relation Age of Onset   Hypertension Mother    Hypertension Father    Colon cancer Father        dx 60s   Cancer Father    Throat cancer Maternal Uncle    Cancer Maternal Uncle    Lung cancer Paternal Uncle    Stroke Maternal Grandmother    Hypertension Maternal Grandmother    Aneurysm Maternal Grandmother    Stroke Maternal Grandfather    Healthy Son     Review of Systems  Constitutional:  Negative for appetite change, chills, fatigue, fever and unexpected weight change.  HENT:   Negative for hearing loss, lump/mass and trouble swallowing.   Eyes:  Negative for eye problems and icterus.  Respiratory:  Negative for chest tightness, cough and shortness of breath.   Cardiovascular:  Negative for chest pain,  leg swelling and palpitations.  Gastrointestinal:  Negative for abdominal distention, abdominal pain, constipation, diarrhea, nausea and vomiting.  Endocrine: Negative for hot flashes.  Genitourinary:  Negative for difficulty urinating.   Musculoskeletal:  Negative for arthralgias and gait problem.  Skin:  Negative for itching and rash.  Neurological:  Positive for headaches. Negative for dizziness, extremity weakness, gait problem, light-headedness, numbness, seizures and speech difficulty.  Hematological:  Negative for adenopathy. Does not bruise/bleed easily.  Psychiatric/Behavioral:  Negative for depression. The patient is not nervous/anxious.       PHYSICAL EXAMINATION   Onc Performance Status - 09/29/24 1117       ECOG Perf Status   ECOG Perf Status Restricted in physically strenuous activity but ambulatory and able to carry out work of a light or sedentary nature, e.g., light house work, office work      KPS SCALE   KPS % SCORE Able to carry on normal activity, minor s/s of disease          Vitals:   09/29/24 1054  BP: (!) 140/87  Pulse: 88  Resp: 18  Temp: 97.6 F (36.4 C)  SpO2: 100%    Physical Exam Constitutional:      General: She is not in acute distress.    Appearance: Normal appearance. She is not toxic-appearing.  HENT:     Head: Normocephalic and atraumatic.     Mouth/Throat:     Mouth: Mucous membranes are moist.     Pharynx: Oropharynx is clear. No oropharyngeal exudate or posterior oropharyngeal erythema.  Eyes:     General: No scleral icterus. Cardiovascular:     Rate and Rhythm: Normal rate and regular rhythm.     Pulses: Normal pulses.     Heart sounds: Normal heart sounds.  Pulmonary:     Effort: Pulmonary effort is normal.     Breath sounds: Normal breath sounds.  Chest:     Comments: Small well circumscribed soft nodule present in left breast at 4 oclock about 3cmfn.  Breast exam otherwise benign Abdominal:     General: Abdomen is  flat. Bowel sounds are normal. There is no distension.  Palpations: Abdomen is soft.     Tenderness: There is no abdominal tenderness.  Musculoskeletal:        General: No swelling.     Cervical back: Neck supple.     Comments: Strength 5/5 bilateral upper and lower ext   Lymphadenopathy:     Cervical: No cervical adenopathy.     Upper Body:     Right upper body: No supraclavicular or axillary adenopathy.     Left upper body: No supraclavicular or axillary adenopathy.  Skin:    General: Skin is warm and dry.     Findings: No rash.  Neurological:     General: No focal deficit present.     Mental Status: She is alert and oriented to person, place, and time.     Cranial Nerves: No cranial nerve deficit.     Motor: No weakness.  Psychiatric:        Mood and Affect: Mood normal.        Behavior: Behavior normal.     LABORATORY DATA:  CBC    Component Value Date/Time   WBC 9.4 09/29/2024 1034   WBC 10.8 (H) 12/16/2023 0035   RBC 5.49 (H) 09/29/2024 1034   HGB 14.2 09/29/2024 1034   HGB 13.4 06/01/2020 1012   HCT 44.2 09/29/2024 1034   HCT 41.8 06/01/2020 1012   PLT 322 09/29/2024 1034   PLT 339 06/01/2020 1012   MCV 80.5 09/29/2024 1034   MCV 83 06/01/2020 1012   MCH 25.9 (L) 09/29/2024 1034   MCHC 32.1 09/29/2024 1034   RDW 15.2 09/29/2024 1034   RDW 14.0 06/01/2020 1012   LYMPHSABS 2.6 09/29/2024 1034   LYMPHSABS 2.0 11/23/2017 1648   MONOABS 0.5 09/29/2024 1034   EOSABS 0.1 09/29/2024 1034   EOSABS 0.1 11/23/2017 1648   BASOSABS 0.0 09/29/2024 1034   BASOSABS 0.0 11/23/2017 1648    CMP     Component Value Date/Time   NA 140 09/29/2024 1034   NA 142 02/06/2023 1140   K 4.6 09/29/2024 1034   CL 102 09/29/2024 1034   CO2 29 09/29/2024 1034   GLUCOSE 96 09/29/2024 1034   BUN 13 09/29/2024 1034   BUN 8 02/06/2023 1140   CREATININE 0.77 09/29/2024 1034   CREATININE 0.73 07/10/2014 0919   CALCIUM 9.3 09/29/2024 1034   PROT 7.8 09/29/2024 1034   PROT 6.8  03/01/2020 1107   ALBUMIN 4.3 09/29/2024 1034   ALBUMIN 4.1 03/01/2020 1107   AST 23 09/29/2024 1034   ALT 29 09/29/2024 1034   ALKPHOS 86 09/29/2024 1034   BILITOT 0.3 09/29/2024 1034   GFRNONAA >60 09/29/2024 1034   GFRNONAA >89 07/10/2014 0919   GFRAA 112 06/01/2020 1012   GFRAA >60 05/25/2019 0810   GFRAA >89 07/10/2014 0919     ASSESSMENT and THERAPY PLAN:    Assessment and Plan Assessment & Plan Stage IA estrogen receptor positive invasive ductal carcinoma of the left breast Post-lumpectomy, radiation, and anastrozole .  - Continue anastrozole  1 mg daily through January 2028. - Ordered Guardant Reveal ctDNA test for MRD and recurrence surveillance - Scheduled follow-up in 2-3 weeks after MRI and mammogram/ultrasound  New daily persistent headache Persistent mild headaches since December. Differential includes dehydration, sleep disturbance, stress, medication effects, and less likely metastasis or aneurysm. Neurological exam normal. - Ordered brain MRI to evaluate for intracranial pathology including metastasis or aneurysm. - Provided counseling on hydration, sleep hygiene, and stress management. - Scheduled follow-up in 2-3 weeks after MRI.  Breast nodule Intermittent painful sensation near left breast, likely cystic. Imaging needed due to cancer history and new symptoms. - Ordered breast ultrasound and breast MRI to evaluate the area of concern. - Scheduled follow-up in 2-3 weeks after imaging results.  Pulmonary embolism on anticoagulation PE due to tamoxifen , managed with Eliquis .  - Continue Eliquis  as prescribed for PE management.   All questions were answered. The patient knows to call the clinic with any problems, questions or concerns. We can certainly see the patient much sooner if necessary.  Total encounter time:40 minutes*in face-to-face visit time, chart review, lab review, care coordination, order entry, and documentation of the encounter  time.    Morna Kendall, NP 09/29/24 11:43 AM Medical Oncology and Hematology [facility] [address] [place], [place] [address] Tel. [phone]    Fax. [phone]  *Total Encounter Time as defined by the Centers for Medicare and Medicaid Services includes, in addition to the face-to-face time of a patient visit (documented in the note above) non-face-to-face time: obtaining and reviewing outside history, ordering and reviewing medications, tests or procedures, care coordination (communications with other health care professionals or caregivers) and documentation in the medical record.

## 2024-09-30 ENCOUNTER — Other Ambulatory Visit: Payer: Self-pay

## 2024-09-30 DIAGNOSIS — N63 Unspecified lump in unspecified breast: Secondary | ICD-10-CM

## 2024-09-30 DIAGNOSIS — C50412 Malignant neoplasm of upper-outer quadrant of left female breast: Secondary | ICD-10-CM

## 2024-09-30 NOTE — Telephone Encounter (Signed)
"  Okay to place referral as requested  "

## 2024-10-05 ENCOUNTER — Other Ambulatory Visit: Payer: Self-pay

## 2024-10-05 DIAGNOSIS — C50412 Malignant neoplasm of upper-outer quadrant of left female breast: Secondary | ICD-10-CM

## 2024-10-05 DIAGNOSIS — N63 Unspecified lump in unspecified breast: Secondary | ICD-10-CM

## 2024-10-06 ENCOUNTER — Other Ambulatory Visit: Payer: Self-pay

## 2024-10-06 DIAGNOSIS — N63 Unspecified lump in unspecified breast: Secondary | ICD-10-CM

## 2024-10-06 DIAGNOSIS — C50412 Malignant neoplasm of upper-outer quadrant of left female breast: Secondary | ICD-10-CM

## 2024-10-07 ENCOUNTER — Ambulatory Visit: Payer: Self-pay | Admitting: Urology

## 2024-10-07 VITALS — BP 137/83 | HR 90

## 2024-10-07 DIAGNOSIS — N2889 Other specified disorders of kidney and ureter: Secondary | ICD-10-CM

## 2024-10-07 LAB — URINALYSIS, ROUTINE W REFLEX MICROSCOPIC
Bilirubin, UA: NEGATIVE
Glucose, UA: NEGATIVE
Ketones, UA: NEGATIVE
Leukocytes,UA: NEGATIVE
Nitrite, UA: NEGATIVE
Protein,UA: NEGATIVE
RBC, UA: NEGATIVE
Specific Gravity, UA: 1.03 (ref 1.005–1.030)
Urobilinogen, Ur: 0.2 mg/dL (ref 0.2–1.0)
pH, UA: 5.5 (ref 5.0–7.5)

## 2024-10-07 NOTE — Progress Notes (Unsigned)
 "  10/07/2024 10:02 AM   Erin Gallagher 08-Apr-1971 [id number]  Referring provider: Glennon Almarie POUR, MD [address] [place],  [place] [address]  No chief complaint on file.   HPI: Erin Gallagher is a 54yo here for evaluation of a right renal mass. 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   Personal history of radiation therapy    Shingles    2025 on back   Sleep apnea     Surgical History: Past Surgical History:  Procedure Laterality Date   BREAST BIOPSY Left 05/16/2019   BREAST LUMPECTOMY Left 06/14/2019   BREAST LUMPECTOMY WITH RADIOACTIVE SEED AND SENTINEL LYMPH NODE BIOPSY Left 06/14/2019   Procedure: LEFT BREAST LUMPECTOMY WITH RADIOACTIVE SEED AND SENTINEL LYMPH NODE BIOPSY;  Surgeon: Aron Shoulders, MD;  Location: [facility];  Service: General;  Laterality: Left;   CALCANEAL OSTEOTOMY Right 10/01/2023   Procedure: CALCANEAL OSTEOTOMY;  Surgeon: Kit Rush, MD;  Location: [facility];  Service: Orthopedics;  Laterality: Right;   CESAREAN SECTION  09/1996   1997   CHOLECYSTECTOMY  09/11   FOOT SURGERY Right    GASTROCNEMIUS RECESSION Right 10/01/2023   Procedure: GASTROCNEMIUS RECESSION;  Surgeon: Kit Rush, MD;  Location: [facility];  Service: Orthopedics;  Laterality: Right;   POSTERIOR TIBIAL TENDON REPAIR Right 10/01/2023   Procedure: POSTERIOR TIBIAL TENDON  TENOLYSIS;  Surgeon: Kit Rush, MD;  Location: [facility];  Service: Orthopedics;  Laterality: Right;   TUBAL LIGATION  10/1996   1998    Home Medications:  Allergies as of 10/07/2024       Reactions   Other Itching, Rash, Other (See Comments)   Steri Strips cause itching and a rash   Wound Dressings Rash        Medication List        Accurate as of October 07, 2024 10:02 AM. If you have any questions, ask your nurse or doctor.          acyclovir  800 MG tablet Commonly known as: ZOVIRAX  Take 1 tablet (800 mg total) by mouth 5 (five) times daily for 10 days.   amLODipine  5 MG tablet Commonly known as: NORVASC  Take 1 tablet (5 mg total) by mouth daily.   anastrozole  1 MG tablet Commonly known as: ARIMIDEX  Take 1 tablet (1 mg total) by mouth daily.   cyclobenzaprine  10 MG tablet Commonly known as: FLEXERIL  Take 1 tablet (10 mg total) by mouth at bedtime.   Eliquis  5 MG Tabs tablet Generic drug: apixaban  Take 1 tablet (5 mg total) by mouth 2 (two) times daily.   gabapentin  100 MG capsule Commonly known as: NEURONTIN  Take 1 capsule (100 mg total) by mouth at bedtime.   HYDROcodone  bit-homatropine 5-1.5 MG/5ML syrup Commonly known as: HYCODAN Take 5  mLs by mouth at bedtime as needed for cough.   losartan -hydrochlorothiazide  100-12.5 MG tablet Commonly known as: HYZAAR  Take 1 tablet by mouth daily.   methylPREDNISolone  4 MG Tbpk tablet Commonly known as: MEDROL  DOSEPAK Take as directed on package.   TYLENOL  500 MG tablet Generic drug: acetaminophen  Take 500-1,000 mg by mouth every 6 (six) hours as needed for mild pain (pain score 1-3) or headache.   VITAMIN D  PO Take 1,000 Units by mouth daily.        Allergies: Allergies[1]  Family History: Family History  Problem Relation Age of Onset   Hypertension Mother    Hypertension Father    Colon cancer Father        dx 26s   Cancer Father    Throat cancer Maternal Uncle    Cancer  Maternal Uncle    Lung cancer Paternal Uncle    Stroke Maternal Grandmother    Hypertension Maternal Grandmother    Aneurysm Maternal Grandmother    Stroke Maternal Grandfather    Healthy Son     Social History:  reports that she has never smoked. She has been exposed to tobacco smoke. She has never used smokeless tobacco. She reports current alcohol use of about 2.0 standard drinks of alcohol per week. She reports that she does not use drugs.  ROS: All other review of systems were reviewed and are negative except what is noted above in HPI  Physical Exam: BP 137/83   Pulse 90   LMP 12/30/2021   Constitutional:  Alert and oriented, No acute distress. HEENT: [place] AT, moist mucus membranes.  Trachea midline, no masses. Cardiovascular: No clubbing, cyanosis, or edema. Respiratory: Normal respiratory effort, no increased work of breathing. GI: Abdomen is soft, nontender, nondistended, no abdominal masses GU: No CVA tenderness.  Lymph: No cervical or inguinal lymphadenopathy. Skin: No rashes, bruises or suspicious lesions. Neurologic: Grossly intact, no focal deficits, moving all 4 extremities. Psychiatric: Normal mood and affect.  Laboratory Data: Lab Results  Component Value Date   WBC 9.4 09/29/2024   HGB 14.2 09/29/2024   HCT 44.2 09/29/2024   MCV 80.5 09/29/2024   PLT 322 09/29/2024    Lab Results  Component Value Date   CREATININE 0.77 09/29/2024    No results found for: PSA  No results found for: TESTOSTERONE  Lab Results  Component Value Date   HGBA1C 6.6 (H) 12/14/2023    Urinalysis    Component Value Date/Time   COLORURINE YELLOW 08/20/2023 1400   APPEARANCEUR CLEAR 08/20/2023 1400   LABSPEC 1.008 08/20/2023 1400   PHURINE 5.0 08/20/2023 1400   GLUCOSEU NEGATIVE 08/20/2023 1400   GLUCOSEU NEGATIVE 01/17/2022 0828   HGBUR NEGATIVE 08/20/2023 1400   BILIRUBINUR NEGATIVE 08/20/2023 1400   BILIRUBINUR negative 12/03/2017 0958   BILIRUBINUR NEG  07/17/2016 1634   KETONESUR NEGATIVE 08/20/2023 1400   PROTEINUR NEGATIVE 08/20/2023 1400   UROBILINOGEN 0.2 01/17/2022 0828   NITRITE NEGATIVE 08/20/2023 1400   LEUKOCYTESUR NEGATIVE 08/20/2023 1400    Lab Results  Component Value Date   BACTERIA FEW (A) 08/20/2023    Pertinent Imaging: *** No results found for this or any previous visit.  No results found for this or any previous visit.  No results found for this or any previous visit.  No results found for this or any previous visit.  No results found for this or any previous visit.  No results found for this or any previous visit.  No results found for this or  any previous visit.  No results found for this or any previous visit.   Assessment & Plan:    1. Renal mass (Primary) *** - Urinalysis, Routine w reflex microscopic   No follow-ups on file.  Belvie Clara, MD  [facility] [place]      [1]  Allergies Allergen Reactions   Other Itching, Rash and Other (See Comments)    Steri Strips cause itching and a rash   Wound Dressings Rash   "

## 2024-10-11 ENCOUNTER — Ambulatory Visit (HOSPITAL_COMMUNITY)

## 2024-10-20 ENCOUNTER — Inpatient Hospital Stay: Attending: Hematology and Oncology | Admitting: Adult Health

## 2025-04-19 ENCOUNTER — Ambulatory Visit: Admitting: Urology

## 2025-08-08 ENCOUNTER — Inpatient Hospital Stay: Admitting: Hematology and Oncology
